# Patient Record
Sex: Male | Born: 1935 | Race: White | Hispanic: No | Smoking: Former smoker
Health system: Southern US, Community
[De-identification: ages and names within clinical notes are randomized; demographics above are authoritative.]

## PROBLEM LIST (undated history)

## (undated) DIAGNOSIS — E079 Disorder of thyroid, unspecified: Secondary | ICD-10-CM

## (undated) DIAGNOSIS — I714 Abdominal aortic aneurysm, without rupture, unspecified: Secondary | ICD-10-CM

## (undated) DIAGNOSIS — I10 Essential (primary) hypertension: Secondary | ICD-10-CM

## (undated) DIAGNOSIS — R0602 Shortness of breath: Secondary | ICD-10-CM

## (undated) DIAGNOSIS — A809 Acute poliomyelitis, unspecified: Secondary | ICD-10-CM

## (undated) DIAGNOSIS — M1990 Unspecified osteoarthritis, unspecified site: Secondary | ICD-10-CM

## (undated) DIAGNOSIS — Z9289 Personal history of other medical treatment: Secondary | ICD-10-CM

## (undated) DIAGNOSIS — J449 Chronic obstructive pulmonary disease, unspecified: Secondary | ICD-10-CM

## (undated) DIAGNOSIS — I2510 Atherosclerotic heart disease of native coronary artery without angina pectoris: Secondary | ICD-10-CM

## (undated) DIAGNOSIS — I499 Cardiac arrhythmia, unspecified: Secondary | ICD-10-CM

## (undated) DIAGNOSIS — E785 Hyperlipidemia, unspecified: Secondary | ICD-10-CM

## (undated) DIAGNOSIS — I4892 Unspecified atrial flutter: Secondary | ICD-10-CM

## (undated) HISTORY — PX: JOINT REPLACEMENT: SHX530

## (undated) HISTORY — DX: Unspecified osteoarthritis, unspecified site: M19.90

## (undated) HISTORY — DX: Essential (primary) hypertension: I10

## (undated) HISTORY — PX: POPLITEAL SYNOVIAL CYST EXCISION: SUR555

## (undated) HISTORY — DX: Chronic obstructive pulmonary disease, unspecified: J44.9

## (undated) HISTORY — DX: Hyperlipidemia, unspecified: E78.5

## (undated) HISTORY — DX: Unspecified atrial flutter: I48.92

## (undated) HISTORY — DX: Personal history of other medical treatment: Z92.89

## (undated) HISTORY — DX: Abdominal aortic aneurysm, without rupture: I71.4

## (undated) HISTORY — DX: Disorder of thyroid, unspecified: E07.9

## (undated) HISTORY — DX: Abdominal aortic aneurysm, without rupture, unspecified: I71.40

## (undated) HISTORY — DX: Atherosclerotic heart disease of native coronary artery without angina pectoris: I25.10

---
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
## 1992-04-19 HISTORY — PX: TOTAL HIP ARTHROPLASTY: SHX124

## 2002-04-19 HISTORY — PX: KNEE ARTHROPLASTY: SHX992

## 2007-04-20 HISTORY — PX: CARDIAC CATHETERIZATION: SHX172

## 2008-04-01 ENCOUNTER — Ambulatory Visit (HOSPITAL_COMMUNITY): Admission: RE | Admit: 2008-04-01 | Discharge: 2008-04-01 | Payer: Self-pay | Admitting: Orthopedic Surgery

## 2008-11-18 ENCOUNTER — Encounter: Admission: RE | Admit: 2008-11-18 | Discharge: 2008-11-18 | Payer: Self-pay | Admitting: Family Medicine

## 2009-07-31 ENCOUNTER — Encounter: Admission: RE | Admit: 2009-07-31 | Discharge: 2009-07-31 | Payer: Self-pay | Admitting: Family Medicine

## 2009-08-11 ENCOUNTER — Ambulatory Visit: Payer: Self-pay | Admitting: Surgery

## 2009-08-18 ENCOUNTER — Encounter: Admission: RE | Admit: 2009-08-18 | Discharge: 2009-08-18 | Payer: Self-pay | Admitting: Surgery

## 2009-08-18 ENCOUNTER — Ambulatory Visit: Payer: Self-pay | Admitting: Surgery

## 2010-03-16 ENCOUNTER — Ambulatory Visit: Payer: Self-pay | Admitting: Surgery

## 2010-03-16 ENCOUNTER — Encounter
Admission: RE | Admit: 2010-03-16 | Discharge: 2010-03-16 | Payer: Self-pay | Source: Home / Self Care | Admitting: Surgery

## 2010-04-01 ENCOUNTER — Ambulatory Visit (HOSPITAL_COMMUNITY)
Admission: RE | Admit: 2010-04-01 | Discharge: 2010-04-01 | Payer: Self-pay | Source: Home / Self Care | Attending: Surgery | Admitting: Surgery

## 2010-04-17 ENCOUNTER — Inpatient Hospital Stay (HOSPITAL_COMMUNITY)
Admission: RE | Admit: 2010-04-17 | Discharge: 2010-04-18 | Payer: Self-pay | Source: Home / Self Care | Attending: Surgery | Admitting: Surgery

## 2010-04-19 DIAGNOSIS — Z9289 Personal history of other medical treatment: Secondary | ICD-10-CM

## 2010-04-19 HISTORY — DX: Personal history of other medical treatment: Z92.89

## 2010-04-19 HISTORY — PX: TRANSTHORACIC ECHOCARDIOGRAM: SHX275

## 2010-05-01 ENCOUNTER — Ambulatory Visit: Admit: 2010-05-01 | Payer: Self-pay | Admitting: Surgery

## 2010-05-01 ENCOUNTER — Ambulatory Visit
Admission: RE | Admit: 2010-05-01 | Discharge: 2010-05-01 | Payer: Self-pay | Source: Home / Self Care | Attending: Surgery | Admitting: Surgery

## 2010-05-04 ENCOUNTER — Ambulatory Visit: Admit: 2010-05-04 | Payer: Self-pay | Admitting: Surgery

## 2010-05-11 ENCOUNTER — Ambulatory Visit
Admission: RE | Admit: 2010-05-11 | Discharge: 2010-05-11 | Payer: Self-pay | Source: Home / Self Care | Attending: Surgery | Admitting: Surgery

## 2010-05-12 NOTE — Procedures (Unsigned)
CAROTID DUPLEX EXAM  INDICATION:  Preoperative AAA repair.  HISTORY: Diabetes:  No. Cardiac:  No. Hypertension:  Yes. Smoking:  Previous. Previous Surgery:  No. CV History:  Asymptomatic. Amaurosis Fugax No, Paresthesias No, Hemiparesis No                                      RIGHT             LEFT Brachial systolic pressure:         148               146 Brachial Doppler waveforms:         Normal            Normal Vertebral direction of flow:        Antegrade         Antegrade DUPLEX VELOCITIES (cm/sec) CCA peak systolic                   75                103 ECA peak systolic                   131               122 ICA peak systolic                   81                70 ICA end diastolic                   16                19 PLAQUE MORPHOLOGY:                  Mixed             Heterogeneous PLAQUE AMOUNT:                      Minimal           Minimal PLAQUE LOCATION:                    Bifurcation       CCA, bifurcation  IMPRESSION: 1. Bilateral internal carotid artery velocities suggest 1% to 39%     stenosis. 2. Antegrade vertebral arteries bilaterally.    ___________________________________________ V. Charlena Cross, MD  EM/MEDQ  D:  05/01/2010  T:  05/01/2010  Job:  [id number]

## 2010-05-12 NOTE — Assessment & Plan Note (Addendum)
OFFICE VISIT  Austin [name], Austin [name] DOB:  1926-11-03                                       05/11/2010 CHART#:[id number]  The patient comes back in today for further discussions of his abdominal aortic aneurysm.  Recently I tried to perform this procedure endovascularly; however, based on the way the endograft was lying I ended up aborting the procedure and just tried to get the graft out.  I had to convert the right groin from a percutaneous to an open repair in order to get the graft out.  Now the patient comes back in today to discuss his options.  We have decided to proceed with aortobifemoral grafting to be performed February 2nd at [facility].  I discussed all the risks and benefits of the procedure including the risk of dying, the risk of cardiopulmonary complications, risk of intestinal complications, blood loss, lower extremity peripheral vascular problems.  He understands all the risks as well as the benefits.  I ultrasounded his carotids today which have minimal stenosis bilaterally.  He is an ABI of 1.0 bilaterally.  He has been cleared by nuclear [facility] at [facility].  I am giving him a bottle of mag citrate for bowel prep prior to his operation which his first case Tuesday, February 2nd.    Jorge Ny, MD Electronically Signed  VWB/MEDQ  D:  05/11/2010  T:  05/12/2010  Job:  [id number]  cc:   Burnell Blanks, MD

## 2010-05-18 LAB — CBC
HCT: 41.4 % (ref 39.0–52.0)
MCV: 95 fL (ref 78.0–100.0)
RBC: 4.36 MIL/uL (ref 4.22–5.81)
RDW: 13.4 % (ref 11.5–15.5)
WBC: 8 10*3/uL (ref 4.0–10.5)

## 2010-05-18 LAB — URINALYSIS, ROUTINE W REFLEX MICROSCOPIC
Bilirubin Urine: NEGATIVE
Specific Gravity, Urine: 1.018 (ref 1.005–1.030)
pH: 5.5 (ref 5.0–8.0)

## 2010-05-18 LAB — URINE MICROSCOPIC-ADD ON

## 2010-05-18 LAB — BLOOD GAS, ARTERIAL
Patient temperature: 98.6
TCO2: 23.3 mmol/L (ref 0–100)
pH, Arterial: 7.475 — ABNORMAL HIGH (ref 7.350–7.450)

## 2010-05-18 LAB — COMPREHENSIVE METABOLIC PANEL
ALT: 18 U/L (ref 0–53)
Albumin: 3.8 g/dL (ref 3.5–5.2)
Alkaline Phosphatase: 74 U/L (ref 39–117)
Potassium: 4.5 mEq/L (ref 3.5–5.1)
Sodium: 136 mEq/L (ref 135–145)
Total Protein: 6.5 g/dL (ref 6.0–8.3)

## 2010-05-18 LAB — SURGICAL PCR SCREEN: MRSA, PCR: NEGATIVE

## 2010-05-21 ENCOUNTER — Inpatient Hospital Stay (HOSPITAL_COMMUNITY): Payer: Medicare Other

## 2010-05-21 ENCOUNTER — Other Ambulatory Visit: Payer: Self-pay | Admitting: Surgery

## 2010-05-21 ENCOUNTER — Inpatient Hospital Stay (HOSPITAL_COMMUNITY)
Admission: RE | Admit: 2010-05-21 | Discharge: 2010-05-31 | DRG: 238 | Disposition: A | Discharge status: Home / Self Care | Payer: Medicare Other | Attending: Surgery | Admitting: Surgery

## 2010-05-21 DIAGNOSIS — Z96659 Presence of unspecified artificial knee joint: Secondary | ICD-10-CM

## 2010-05-21 DIAGNOSIS — I10 Essential (primary) hypertension: Secondary | ICD-10-CM | POA: Diagnosis present

## 2010-05-21 DIAGNOSIS — Z87891 Personal history of nicotine dependence: Secondary | ICD-10-CM

## 2010-05-21 DIAGNOSIS — I714 Abdominal aortic aneurysm, without rupture, unspecified: Principal | ICD-10-CM | POA: Diagnosis present

## 2010-05-21 DIAGNOSIS — D649 Anemia, unspecified: Secondary | ICD-10-CM | POA: Diagnosis not present

## 2010-05-21 DIAGNOSIS — Y9210 Unspecified residential institution as the place of occurrence of the external cause: Secondary | ICD-10-CM | POA: Diagnosis not present

## 2010-05-21 DIAGNOSIS — Y832 Surgical operation with anastomosis, bypass or graft as the cause of abnormal reaction of the patient, or of later complication, without mention of misadventure at the time of the procedure: Secondary | ICD-10-CM | POA: Diagnosis not present

## 2010-05-21 DIAGNOSIS — E039 Hypothyroidism, unspecified: Secondary | ICD-10-CM | POA: Diagnosis present

## 2010-05-21 DIAGNOSIS — K560 Paralytic ileus: Secondary | ICD-10-CM | POA: Diagnosis not present

## 2010-05-21 DIAGNOSIS — J449 Chronic obstructive pulmonary disease, unspecified: Secondary | ICD-10-CM | POA: Diagnosis present

## 2010-05-21 DIAGNOSIS — E876 Hypokalemia: Secondary | ICD-10-CM | POA: Diagnosis not present

## 2010-05-21 DIAGNOSIS — E7800 Pure hypercholesterolemia, unspecified: Secondary | ICD-10-CM | POA: Diagnosis present

## 2010-05-21 DIAGNOSIS — I723 Aneurysm of iliac artery: Secondary | ICD-10-CM | POA: Diagnosis present

## 2010-05-21 DIAGNOSIS — Z7982 Long term (current) use of aspirin: Secondary | ICD-10-CM

## 2010-05-21 DIAGNOSIS — J4489 Other specified chronic obstructive pulmonary disease: Secondary | ICD-10-CM | POA: Diagnosis present

## 2010-05-21 DIAGNOSIS — Z96649 Presence of unspecified artificial hip joint: Secondary | ICD-10-CM

## 2010-05-21 DIAGNOSIS — K929 Disease of digestive system, unspecified: Secondary | ICD-10-CM | POA: Diagnosis not present

## 2010-05-21 HISTORY — PX: ABDOMINAL AORTIC ANEURYSM REPAIR: SUR1152

## 2010-05-21 LAB — BLOOD GAS, ARTERIAL
Drawn by: 249101
O2 Content: 12 L/min
pCO2 arterial: 39.2 mmHg (ref 35.0–45.0)
pH, Arterial: 7.384 (ref 7.350–7.450)
pO2, Arterial: 71.9 mmHg — ABNORMAL LOW (ref 80.0–100.0)

## 2010-05-21 LAB — PROTIME-INR
INR: 1.36 (ref 0.00–1.49)
Prothrombin Time: 17 seconds — ABNORMAL HIGH (ref 11.6–15.2)

## 2010-05-21 LAB — BASIC METABOLIC PANEL
CO2: 23 mEq/L (ref 19–32)
Calcium: 7.3 mg/dL — ABNORMAL LOW (ref 8.4–10.5)
Creatinine, Ser: 1.02 mg/dL (ref 0.4–1.5)
GFR calc Af Amer: >60 mL/min (ref >60)

## 2010-05-21 LAB — PREPARE FRESH FROZEN PLASMA: Unit division: 0

## 2010-05-21 LAB — CBC
MCH: 31.3 pg (ref 26.0–34.0)
Platelets: 146 10*3/uL — ABNORMAL LOW (ref 150–400)
RBC: 3.1 MIL/uL — ABNORMAL LOW (ref 4.22–5.81)
WBC: 14.9 10*3/uL — ABNORMAL HIGH (ref 4.0–10.5)

## 2010-05-21 LAB — GLUCOSE, CAPILLARY: Glucose-Capillary: 141 mg/dL — ABNORMAL HIGH (ref 70–99)

## 2010-05-22 ENCOUNTER — Inpatient Hospital Stay (HOSPITAL_COMMUNITY): Payer: Medicare Other

## 2010-05-22 LAB — GLUCOSE, CAPILLARY

## 2010-05-22 LAB — CBC
Hemoglobin: 10 g/dL — ABNORMAL LOW (ref 13.0–17.0)
MCHC: 32.6 g/dL (ref 30.0–36.0)
RDW: 13.8 % (ref 11.5–15.5)

## 2010-05-22 LAB — COMPREHENSIVE METABOLIC PANEL
AST: 19 U/L (ref 0–37)
Albumin: 2.5 g/dL — ABNORMAL LOW (ref 3.5–5.2)
Calcium: 7.4 mg/dL — ABNORMAL LOW (ref 8.4–10.5)
Creatinine, Ser: 1.05 mg/dL (ref 0.4–1.5)
GFR calc Af Amer: >60 mL/min (ref >60)
Total Protein: 4.1 g/dL — ABNORMAL LOW (ref 6.0–8.3)

## 2010-05-22 LAB — AMYLASE: Amylase: 56 U/L (ref 0–105)

## 2010-05-23 LAB — CBC
MCHC: 33.2 g/dL (ref 30.0–36.0)
MCV: 96.3 fL (ref 78.0–100.0)
Platelets: 103 10*3/uL — ABNORMAL LOW (ref 150–400)
RDW: 13.8 % (ref 11.5–15.5)
WBC: 16.7 10*3/uL — ABNORMAL HIGH (ref 4.0–10.5)

## 2010-05-23 LAB — GLUCOSE, CAPILLARY
Glucose-Capillary: 157 mg/dL — ABNORMAL HIGH (ref 70–99)
Glucose-Capillary: 118 mg/dL — ABNORMAL HIGH (ref 70–99)
Glucose-Capillary: 127 mg/dL — ABNORMAL HIGH (ref 70–99)

## 2010-05-23 LAB — BASIC METABOLIC PANEL
BUN: 19 mg/dL (ref 6–23)
Calcium: 7.7 mg/dL — ABNORMAL LOW (ref 8.4–10.5)
GFR calc non Af Amer: >60 mL/min (ref >60)
GFR calc non Af Amer: >60 mL/min (ref >60)
Potassium: 3.6 mEq/L (ref 3.5–5.1)
Potassium: 3.6 mEq/L (ref 3.5–5.1)
Sodium: 131 mEq/L — ABNORMAL LOW (ref 135–145)

## 2010-05-23 LAB — CARDIAC PANEL(CRET KIN+CKTOT+MB+TROPI)
CK, MB: 3.5 ng/mL (ref 0.3–4.0)
Relative Index: 1.6 (ref 0.0–2.5)
Troponin I: 0.02 ng/mL (ref 0.00–0.06)

## 2010-05-24 LAB — TYPE AND SCREEN: Unit division: 0

## 2010-05-24 LAB — GLUCOSE, CAPILLARY
Glucose-Capillary: 152 mg/dL — ABNORMAL HIGH (ref 70–99)
Glucose-Capillary: 127 mg/dL — ABNORMAL HIGH (ref 70–99)
Glucose-Capillary: 125 mg/dL — ABNORMAL HIGH (ref 70–99)

## 2010-05-24 LAB — CBC
MCH: 31.2 pg (ref 26.0–34.0)
Platelets: 129 10*3/uL — ABNORMAL LOW (ref 150–400)
RBC: 2.92 MIL/uL — ABNORMAL LOW (ref 4.22–5.81)
WBC: 11.6 10*3/uL — ABNORMAL HIGH (ref 4.0–10.5)

## 2010-05-24 LAB — BASIC METABOLIC PANEL
Chloride: 99 mEq/L (ref 96–112)
Creatinine, Ser: 0.86 mg/dL (ref 0.4–1.5)
GFR calc Af Amer: >60 mL/min (ref >60)
Potassium: 3.6 mEq/L (ref 3.5–5.1)

## 2010-05-25 LAB — GLUCOSE, CAPILLARY
Glucose-Capillary: 131 mg/dL — ABNORMAL HIGH (ref 70–99)
Glucose-Capillary: 139 mg/dL — ABNORMAL HIGH (ref 70–99)
Glucose-Capillary: 139 mg/dL — ABNORMAL HIGH (ref 70–99)

## 2010-05-26 ENCOUNTER — Inpatient Hospital Stay (HOSPITAL_COMMUNITY): Payer: Medicare Other

## 2010-05-26 LAB — BASIC METABOLIC PANEL
BUN: 30 mg/dL — ABNORMAL HIGH (ref 6–23)
CO2: 29 mEq/L (ref 19–32)
Calcium: 8.4 mg/dL (ref 8.4–10.5)
Creatinine, Ser: 1.26 mg/dL (ref 0.4–1.5)
GFR calc Af Amer: >60 mL/min (ref >60)
Glucose, Bld: 150 mg/dL — ABNORMAL HIGH (ref 70–99)

## 2010-05-26 LAB — CBC
HCT: 31.3 % — ABNORMAL LOW (ref 39.0–52.0)
HCT: 30.9 % — ABNORMAL LOW (ref 39.0–52.0)
Hemoglobin: 10.6 g/dL — ABNORMAL LOW (ref 13.0–17.0)
Hemoglobin: 10 g/dL — ABNORMAL LOW (ref 13.0–17.0)
MCH: 31.4 pg (ref 26.0–34.0)
MCH: 30.9 pg (ref 26.0–34.0)
MCHC: 33.9 g/dL (ref 30.0–36.0)
MCHC: 32.4 g/dL (ref 30.0–36.0)
MCV: 92.6 fL (ref 78.0–100.0)
MCV: 95.4 fL (ref 78.0–100.0)

## 2010-05-26 LAB — GLUCOSE, CAPILLARY: Glucose-Capillary: 151 mg/dL — ABNORMAL HIGH (ref 70–99)

## 2010-05-27 ENCOUNTER — Inpatient Hospital Stay (HOSPITAL_COMMUNITY): Payer: Medicare Other

## 2010-05-27 LAB — CBC
HCT: 28.2 % — ABNORMAL LOW (ref 39.0–52.0)
MCHC: 33 g/dL (ref 30.0–36.0)
MCV: 95.9 fL (ref 78.0–100.0)
RDW: 13.8 % (ref 11.5–15.5)

## 2010-05-27 LAB — GLUCOSE, CAPILLARY

## 2010-05-27 LAB — BASIC METABOLIC PANEL
BUN: 41 mg/dL — ABNORMAL HIGH (ref 6–23)
Calcium: 8.1 mg/dL — ABNORMAL LOW (ref 8.4–10.5)
GFR calc non Af Amer: >60 mL/min (ref >60)
Glucose, Bld: 126 mg/dL — ABNORMAL HIGH (ref 70–99)
Sodium: 139 mEq/L (ref 135–145)

## 2010-05-29 LAB — CROSSMATCH
ABO/RH(D): A POS
Unit division: 0
Unit division: 0

## 2010-05-29 LAB — BASIC METABOLIC PANEL
GFR calc non Af Amer: >60 mL/min (ref >60)
Potassium: 3.4 mEq/L — ABNORMAL LOW (ref 3.5–5.1)
Sodium: 135 mEq/L (ref 135–145)

## 2010-05-30 LAB — CBC
HCT: 29.4 % — ABNORMAL LOW (ref 39.0–52.0)
Hemoglobin: 9.8 g/dL — ABNORMAL LOW (ref 13.0–17.0)
WBC: 8.3 10*3/uL (ref 4.0–10.5)

## 2010-05-30 LAB — BASIC METABOLIC PANEL
CO2: 24 mEq/L (ref 19–32)
GFR calc non Af Amer: >60 mL/min (ref >60)
Glucose, Bld: 118 mg/dL — ABNORMAL HIGH (ref 70–99)
Potassium: 3.6 mEq/L (ref 3.5–5.1)
Sodium: 134 mEq/L — ABNORMAL LOW (ref 135–145)

## 2010-05-31 NOTE — Op Note (Addendum)
NAME:  Austin [name], Austin [name]           ACCOUNT NO.:  [id number]  MEDICAL RECORD NO.:  [id number]           PATIENT TYPE:  I  LOCATION:  2315                         FACILITY:  [facility]  PHYSICIAN:  Juleen China IV, MDDATE OF BIRTH:  [birth year]  DATE OF PROCEDURE:  05/21/2010 DATE OF DISCHARGE:                              OPERATIVE REPORT   PREOPERATIVE DIAGNOSIS:  Abdominal aortic and iliac aneurysm.  POSTOPERATIVE DIAGNOSIS:  Abdominal aortic and iliac aneurysm.  PROCEDURE PERFORMED:  Aorto-bi-iliac graft with 18 x 9 Dacron graft.  ANESTHESIA:  General.  SURGEON: 1. Charlena Cross, MD  ASSISTANT:  Della Goo, PA-C  BLOOD LOSS:  See anesthesia record.  SPECIMENS:  Aortic mural thrombus.  COMPLICATIONS:  None.  FINDINGS:  Infrarenal aortic clamp was placed, a bifurcated 18 x 9 graft was sewn at the infrarenal aorta and at the bifurcation of the iliac vessels.  INDICATION:  Austin [name] is an 77 year old gentleman with a large infrarenal abdominal aortic aneurysm and bilateral iliac aneurysms.  I had initially attempted percutaneous endovascular repair; however, I was not happy with the way the device was going to lay due to his very tortuous infrarenal neck, and therefore, I aborted this procedure and remove the graft.  After a long discussion with the family, we have decided to proceed with open aneurysm repair to prevent rupture.  We discussed the myriad of potential complications including blood loss, cardiopulmonary complications, intestinal ischemia, lower extremity ischemia, sexual dysfunction.  The patient and family are all aware of these risks but wished to proceed with aneurysm repair to prevent rupture.  PROCEDURE:  The patient was identified in the holding area and taken to room 8, placed supine on the table.  General endotracheal anesthesia was administered, and the patient was prepped and draped in the usual fashion.  A time-out was called.   Antibiotics were given.  A 10 blade was used to make a midline incision from the xiphoid to below the umbilicus.  Cautery was used throughout the subcutaneous tissue.  The fascia was divided with cautery in the midline.  The peritoneal cavity was entered sharply and the incision was opened throughout its length. The abdomen was inspected.  There was no gross pathology.  There were no liver lesions.  The gallbladder was normal.  The colon was normal.  An Omni-Tract retractor was then placed on the table, a Balfour was used to open the abdomen.  The transverse colon was reflected cephalad.  The small bowel was then mobilized to the patient's right and packed off in a moist blue towel.  The ligament of Treitz was taken down with a combination of cautery and sharp dissection.  The inferior mesenteric vein was identified and ligated between 2-0 silk ties.  Cautery dissection was used to get down to the anterior surface of the aorta.  I initially began dissecting out the patient's infrarenal neck.  This was very tortuous taking 90-degree turn towards the patient's right and then another 90-degree cephalad.  I dissected free the aorta at the level of the left renal artery which was the lowest renal artery and umbilical tape was then passed  at this level.  Next, I identified the inferior mesenteric artery which was encircled with a red vessel loop. Dissection was then carried down to the iliac vessels.  I first exposed the right iliac vessels.  There was aneurysmal degeneration of the distal common iliac artery; therefore, I isolated the common and the external and internal iliac artery and these were both encircled with umbilical tapes.  Next, I avoided the nervous tissue crossing the left iliac artery and individually isolated the external iliac and internal iliac artery.  These were both encircled with umbilical tapes.  The ureters were identified bilaterally and protected laterally.  At  this point in time, the patient was given systemic heparinization.  After the heparin had circulated, both bilateral external and internal iliac arteries were occluded with a Henley clamps.  I then occluded the infrarenal abdominal aorta with an aortic DeBakey clamp.  A #11 blade was used to open the aneurysm which was extended caudad and cranial with Metzenbaum scissors.  There were multiple lumbar arteries which were back bleeding.  These were oversewn with 2-0 figure-of-eight sutures. There was minimal amount of the aortic thrombus, some of which was removed and sent to pathology.  I transected the aorta just distal to the clamp.  I selected an 18 x 9 bifurcated Dacron graft.  The proximal anastomosis was performed first after cutting the graft to the appropriate length.  I placed three horizontal mattress back wall sutures incorporating a felt strip.  These sutures were tied down and then the lateral sutures were used to complete the anastomosis incorporating the felt strip.  Once the anastomosis was secured, the Fogarty-Hydragrip clamps were placed on both limbs of the graft and the aortic clamp was removed, the proximal anastomosis was hemostatic. Attention was then turned towards the right iliac artery.  I proceeded to open the iliac artery on its anterior surface until I got down to near the iliac bifurcation.  Due to his body habitus, this was very deep within the pelvis and exposure was extremely difficult.  For this reason, I elected to perform the distal anastomosis at the level of the hypogastric artery.  The artery was somewhat aneurysmal at this area, however, it did appear adequate to sew to.  The right limb of the graft was then cut to the appropriate length and the iliac artery at this level was transected.  I performed an end-to-end anastomosis using a 4-0 Prolene.  Due to the size discrepancy, multiple pledget-supported sutures were required for hemostasis.  I also  placed CoSeal around the anastomosis.  I have performed the appropriate flushing maneuvers and then reestablished blood flow to the right leg.  This area was then packed off and attention was turned towards the left iliac artery.  I tunneled the graft posterior to the nervous tissue crossing iliac artery.  I opened the iliac artery along its anterior surface and transected it at the level of the hypogastric takeoff, again there was some of the size discrepancy.  I proceeded with performing an end-to-end anastomosis with running 4-0 Prolene.  Prior to completion of the anastomosis, appropriate flush maneuvers were performed, and anastomosis was completed.  Blood flow was first established at the pelvis and then to left leg.  The patient at this point had palpable femoral pulses and good Doppler signal in both external iliac arteries and hypogastric arteries.  Next, I evaluated the inferior mesenteric artery, it had pulsatile backbleeding and I elected to ligate this with 2-0 silk ties.  At this point, the patient's heparin was reversed with 75 mg of protamine.  After the wound was irrigated and I was satisfied with hemostasis, the aneurysmal sac was closed over the graft with a running 2-0 2-0 Vicryl.  I then reapproximated the retroperitoneal tissue with a running 2-0 Vicryl.  The patient's retractor and clamps were then removed.  The bowel was placed back into its anatomic position and ran the bowel to make sure that there were no defects within the bowel, it all appeared healthy.  The NG tube was in good position.  Transverse colon and omentum were then placed over the bowel.  The fascia was closed with two running #1 PDS sutures.  The subcutaneous tissue was closed with a running 3-0 Vicryl, and the skin was closed with running 4-0 Vicryl.  Dermabond was placed on the wound. The drapes were removed.  The patient had audible multiphasic Doppler signals in both feet.  He was then  successfully extubated and taken to the recovery room in stable condition.     Jorge Ny, MD     VWB/MEDQ  D:  05/21/2010  T:  05/22/2010  Job:  [id number]  Electronically Signed by Arelia Longest IV MD on 05/31/2010 10:00:57 PM

## 2010-06-04 NOTE — Discharge Summary (Addendum)
NAME:  Austin [name], Austin [name] NO.:  [id number]  MEDICAL RECORD NO.:  [id number]           PATIENT TYPE:  I  LOCATION:  2034                         FACILITY:  [facility]  PHYSICIAN:  Di Kindle. Edilia Bo, M.D.DATE OF BIRTH:  10-May-1926  DATE OF ADMISSION:  05/21/2010 DATE OF DISCHARGE:                              DISCHARGE SUMMARY   DATE OF DISCHARGE:  May 31, 2010.  CHIEF COMPLAINT:  Abdominal aortic aneurysm.  HISTORY OF PRESENT ILLNESS:  [name] is an 77 year old gentleman whom we have been following for an abdominal aortic aneurysm.  He had been diagnosed with the aneurysm approximately 5 years ago in [place]. He has been asymptomatic.  In April 2011, it was 5.2 cm.  It was also noted, he had bilateral common iliac aneurysms.  He was taken to the [facility] lab for an aortogram on April 01, 2010, it showed a 90-degree angulated neck on the aneurysm, confirmed the size of the aortic aneurysm and bilateral common iliac aneurysms.  It was felt that the patient might be a stent candidate.  He was taken back to the operating room on April 17, 2010, for an attempted endovascular repair of abdominal aortic aneurysm.  This was aborted secondary to inability to traverse the neck, and it was not felt that they could get a good seal with the angulation in the neck of the graft.  The patient was brought back on this admission for open repair.  PAST MEDICAL HISTORY:  Significant for hypertension and hypercholesterolemia.  SOCIAL HISTORY:  He was a previous smoker but quit in 1968.  HOSPITAL COURSE:  The patient was taken to the operating room on May 21, 2010, for an open repair of abdominal aortic aneurysm with an aortobi-iliac bypass graft.  Postoperatively, the patient did well. He was extubated without difficulty.  He had somewhat of a prolonged ileus for several days, but this resolved and he was tolerating p.o. diet, passing flatus, and having bowel  movements.  His abdomen was soft and nontender.  His wounds were healing well.  He had bilateral lower extremities which were warm and pink and DP and PT pulses palpable.  LABORATORY DATA:  On February 11, hemoglobin and hematocrit of 9.8 and 29.4.  Electrolytes within normal limits.  This patient is ambulating, taking p.o., and not requiring any pain medication.  He will be discharged on May 31, 2010.  FINAL DIAGNOSES: 1. Abdominal aortic aneurysm status post open repair with an aortobi-     iliac graft. 2. Hypertension and hypercholesterolemia were well controlled with his     present medications.  He was also started on some metoprolol to     assist with the blood pressure issues.  DISPOSITION:  The patient will be discharged to home.  He will follow up with Dr. Myra Gianotti in approximately 4 weeks.  All instructions were given, both written and verbal to the patient prior to discharge.  DISCHARGE MEDICATIONS: 1. Metoprolol 25 mg twice daily. 2. Percocet 1-2 tablets every 4 hours as needed for pain. 3. Aspirin 81 mg daily. 4. Diovan/HCT 160/12.5 daily. 5. Lovastatin 40 mg daily. 6. Multivitamins daily. 7.  Synthroid 75 mcg daily. 8. Vitamin D 1000 units daily.     Della Goo, PA-C   ______________________________ Di Kindle. Edilia Bo, M.D.    RR/MEDQ  D:  05/30/2010  T:  05/30/2010  Job:  [id number]  Electronically Signed by Della Goo PA on 06/12/2010 03:13:40 PM Electronically Signed by Waverly Ferrari M.D. on 06/16/2010 05:49:51 PM

## 2010-06-05 LAB — POCT I-STAT 7, (LYTES, BLD GAS, ICA,H+H)
Calcium, Ion: 1.1 mmol/L — ABNORMAL LOW (ref 1.12–1.32)
O2 Saturation: 100 % (ref 90.0–100.0)
Patient temperature: 36.6
Potassium: 4.8 mEq/L (ref 3.5–5.1)
Sodium: 135 mEq/L (ref 135–145)
pCO2 arterial: 42.5 mmHg (ref 35.0–45.0)
pH, Arterial: 7.372 (ref 7.350–7.400)

## 2010-06-05 LAB — POCT I-STAT 4, (NA,K, GLUC, HGB,HCT)
Glucose, Bld: 147 mg/dL — ABNORMAL HIGH (ref 70–99)
HCT: 32 % — ABNORMAL LOW (ref 39.0–52.0)

## 2010-06-21 NOTE — Discharge Summary (Addendum)
  NAME:  Austin [name], Austin [name] NO.:  [id number]  MEDICAL RECORD NO.:  [id number]          PATIENT TYPE:  INP  LOCATION:  2011                         FACILITY:  [facility]  PHYSICIAN:  Juleen China IV, MDDATE OF BIRTH:  01-Feb-1927  DATE OF ADMISSION:  04/17/2010 DATE OF DISCHARGE:  04/18/2010                              DISCHARGE SUMMARY   CHIEF COMPLAINT:  Abdominal aortic aneurysm.  HISTORY OF PRESENT ILLNESS:  Austin [name] is an 77 year old gentleman who is being followed for an abdominal aortic aneurysm.  CT scan showed that it is now 5.1 cm with a very tortuous neck.  He had a diagnostic arteriogram which showed he may be a candidate for endovascular repair and so he was brought to the hospital for that purpose on this admission.  PAST MEDICAL HISTORY:  Significant for hypothyroidism and high blood pressure.  He has no diabetes or coronary artery disease.  HOSPITAL COURSE:  The patient was taken to the operating room on April 17, 2010, for ultrasound-guided percutaneous exposure, converted to open exposure of the right common femoral artery with an attempted endovascular repair of abdominal aortic aneurysm.  This was aborted secondary to the tortuosity of the patient's angled neck.  He will be brought back for full open abdominal aortic aneurysm open repair.  FINAL DIAGNOSES: 1. Abdominal aortic aneurysm greater than 5.1 cm, status post     attempted endovascular repair which was aborted secondary to the     tortuosity of the neck of the aneurysm. 2. Hypothyroidism and hypertension were well controlled with his     preoperative medications.  DISPOSITION:  He was discharged to home on April 18, 2010 and will follow up with Dr. Myra Gianotti in several weeks and return for an open repair of abdominal aortic aneurysm.  DISCHARGE MEDICATIONS: 1. Synthroid 75 mcg daily. 2. Vitamin D daily. 3. Vitamin C daily. 4. Aspirin 81 mg daily. 5. Lovastatin 40 mg  daily. 6. Diovan HCT 160/12.5 mg daily. 7. He is also given some Percocet 1 tablet every 4 hours as needed for     pain.     Austin Goo, PA-C   ______________________________ Seth Bake. Charlena Cross, MD    RR/MEDQ  D:  06/17/2010  T:  06/18/2010  Job:  [id number]  Electronically Signed by Arelia Longest IV MD on 06/21/2010 07:38:28 PM

## 2010-06-22 ENCOUNTER — Ambulatory Visit (INDEPENDENT_AMBULATORY_CARE_PROVIDER_SITE_OTHER): Payer: Medicare Other | Admitting: Surgery

## 2010-06-22 DIAGNOSIS — I714 Abdominal aortic aneurysm, without rupture, unspecified: Secondary | ICD-10-CM

## 2010-06-23 NOTE — Assessment & Plan Note (Signed)
OFFICE VISIT  GALE, HULSE DOB:  June 03, 1926                                       06/22/2010 CHART#:[id number]  This is an 77 year old gentleman who underwent open repair of abdominal aortic aneurysm with an aortobi-iliac graft.  He has done remarkably well since his operation which was on February 2.  He has lost about 15 pounds primarily due to his decreased intake.  His appetite is returning.  His bowel function is back to normal.  On examination his extremities are warm and well-perfused.  His midline incision is well-healed.  There is no hernia.  I am very pleased with the progress the patient has made in his recovery.  He is scheduled to go down to [place] for the next couple of months.  I will see him back in 3 months just to make sure he is doing okay.    Jorge Ny, MD Electronically Signed  VWB/MEDQ  D:  06/22/2010  T:  06/23/2010  Job:  [id number]  cc:   Burnell Blanks, MD

## 2010-06-29 LAB — COMPREHENSIVE METABOLIC PANEL
AST: 26 U/L (ref 0–37)
Albumin: 4.1 g/dL (ref 3.5–5.2)
Alkaline Phosphatase: 69 U/L (ref 39–117)
Chloride: 101 mEq/L (ref 96–112)
GFR calc Af Amer: >60 mL/min (ref >60)
Potassium: 4.4 mEq/L (ref 3.5–5.1)
Sodium: 136 mEq/L (ref 135–145)
Total Bilirubin: 0.9 mg/dL (ref 0.3–1.2)
Total Protein: 7.1 g/dL (ref 6.0–8.3)

## 2010-06-29 LAB — URINALYSIS, ROUTINE W REFLEX MICROSCOPIC
Ketones, ur: NEGATIVE mg/dL
Leukocytes, UA: NEGATIVE
Nitrite: NEGATIVE
Protein, ur: NEGATIVE mg/dL
pH: 5 (ref 5.0–8.0)

## 2010-06-29 LAB — TYPE AND SCREEN: Unit division: 0

## 2010-06-29 LAB — BLOOD GAS, ARTERIAL
Acid-Base Excess: 0.1 mmol/L (ref 0.0–2.0)
O2 Saturation: 93.4 %
TCO2: 24.7 mmol/L (ref 0–100)
pCO2 arterial: 34.8 mmHg — ABNORMAL LOW (ref 35.0–45.0)
pO2, Arterial: 69.2 mmHg — ABNORMAL LOW (ref 80.0–100.0)

## 2010-06-29 LAB — APTT: aPTT: 42 seconds — ABNORMAL HIGH (ref 24–37)

## 2010-06-29 LAB — BASIC METABOLIC PANEL
BUN: 21 mg/dL (ref 6–23)
Chloride: 101 mEq/L (ref 96–112)
GFR calc Af Amer: >60 mL/min (ref >60)
GFR calc non Af Amer: >60 mL/min (ref >60)
GFR calc non Af Amer: >60 mL/min (ref >60)
Glucose, Bld: 154 mg/dL — ABNORMAL HIGH (ref 70–99)
Potassium: 4.2 mEq/L (ref 3.5–5.1)
Potassium: 4.3 mEq/L (ref 3.5–5.1)
Sodium: 135 mEq/L (ref 135–145)
Sodium: 136 mEq/L (ref 135–145)

## 2010-06-29 LAB — POCT I-STAT 7, (LYTES, BLD GAS, ICA,H+H)
Bicarbonate: 26.3 mEq/L — ABNORMAL HIGH (ref 20.0–24.0)
Patient temperature: 35.8
TCO2: 28 mmol/L (ref 0–100)
pCO2 arterial: 49.1 mmHg — ABNORMAL HIGH (ref 35.0–45.0)
pH, Arterial: 7.332 — ABNORMAL LOW (ref 7.350–7.450)

## 2010-06-29 LAB — CBC
HCT: 34.4 % — ABNORMAL LOW (ref 39.0–52.0)
HCT: 35.6 % — ABNORMAL LOW (ref 39.0–52.0)
Hemoglobin: 11.8 g/dL — ABNORMAL LOW (ref 13.0–17.0)
Hemoglobin: 12 g/dL — ABNORMAL LOW (ref 13.0–17.0)
MCHC: 33.7 g/dL (ref 30.0–36.0)
Platelets: 149 10*3/uL — ABNORMAL LOW (ref 150–400)
RBC: 3.59 MIL/uL — ABNORMAL LOW (ref 4.22–5.81)
RBC: 4.65 MIL/uL (ref 4.22–5.81)
RDW: 13.3 % (ref 11.5–15.5)
RDW: 13.3 % (ref 11.5–15.5)
WBC: 7.5 10*3/uL (ref 4.0–10.5)
WBC: 7.7 10*3/uL (ref 4.0–10.5)

## 2010-06-29 LAB — ABO/RH: ABO/RH(D): A POS

## 2010-06-29 LAB — URINE MICROSCOPIC-ADD ON

## 2010-06-29 LAB — SURGICAL PCR SCREEN: Staphylococcus aureus: NEGATIVE

## 2010-06-30 LAB — POCT I-STAT, CHEM 8
Chloride: 104 mEq/L (ref 96–112)
Creatinine, Ser: 1.1 mg/dL (ref 0.4–1.5)
Glucose, Bld: 120 mg/dL — ABNORMAL HIGH (ref 70–99)
Hemoglobin: 16 g/dL (ref 13.0–17.0)
Potassium: 4.1 mEq/L (ref 3.5–5.1)

## 2010-09-01 NOTE — Assessment & Plan Note (Signed)
OFFICE VISIT   DOM, HAVERLAND  DOB:  [birth year]                                       03/16/2010  CHART#:[id number]   REASON FOR VISIT:  Follow up aneurysm.   The patient is an 77 year old gentleman that I have been following for  abdominal aortic aneurysm.  He comes back today for a CT scan.  He has  not had any symptoms.   PHYSICAL EXAMINATION:  Heart rate 76, blood pressure 133/78.  General:  Well-appearing, no distress.  Respirations are nonlabored.  Cardiovascular:  Palpable pedal pulses.  Neurologic:  He is intact.   DIAGNOSTICS:  I have reviewed his CT scan that shows progressive  increase in size of his aneurysm.  It is now 5.1 cm.   ASSESSMENT:  Abdominal aortic aneurysm.   PLAN:  The patient's aneurysm has increased in size to the point where I  would consider endovascular repair.  Due to the tortuosity of his  infrarenal neck, I think that we need to first get a diagnostic  arteriogram to see if he is indeed a candidate for endovascular repair.  I plan on doing this on Wednesday, December 14th.  If he is a candidate  for repair, we will plan on fixing this the week between Christmas and  New Year's.  I will need to further evaluate his iliac system to see  which device he is going to be a candidate for.  In addition, while he  is recovering from his catheterization he will need a carotid duplex.     Jorge Ny, MD  Electronically Signed   VWB/MEDQ  D:  03/16/2010  T:  03/16/2010  Job:  [id number]   cc:   Burnell Blanks, MD

## 2010-09-01 NOTE — Assessment & Plan Note (Signed)
OFFICE VISIT   KINGDOM, VANZANTEN  DOB:  20-May-1926                                       08/18/2009  CHART#:[id number]   REASON FOR VISIT:  Follow up aneurysm.   The patient comes back in today for discussion of a CT scan to evaluate  his abdominal aortic aneurysm.  I have independently reviewed his  aneurysm.  By my measurements, the largest diameter of his aneurysm is  4.85 cm.  The patient does have marked tortuosity of his aorta.   I discussed that this may preclude him from being an endovascular stent  graft candidate.  However, I am unwilling to make that conclusion at  this time.  Regardless of the size of his aneurysm, I think the risk of  rupture is low.  Due to his tortuosity, I also think it is going to be  difficult to follow him with an ultrasound and get an accurate  measurement.  I am going to have him see me in 6 months with a repeat CT  scan.  We spent approximately 20 minutes today discussing these  findings.     Jorge Ny, MD  Electronically Signed   VWB/MEDQ  D:  08/18/2009  T:  08/19/2009  Job:  [id number]

## 2010-09-01 NOTE — Assessment & Plan Note (Signed)
OFFICE VISIT   Austin [name], Austin [name]  DOB:  [birth year]                                       08/11/2009  CHART#:[id number]   REASON FOR VISIT:  Abdominal aortic aneurysm.   REFERRING PHYSICIAN:  Dr. Nathanial Rancher.   FAMILY HISTORY:  This is an 77 year old gentleman that I am seeing at  the request of Dr. Nathanial Rancher for evaluation of abdominal aortic aneurysm.  The patient recently moved down from [place].  He had been diagnosed  with the aneurysm approximately 5 years ago.  He is here to have further  followup.  A recent ultrasound had revealed that it had in size to 5.2  cm.  He does not have any symptoms, specifically no abdominal pain or  back pain.   The patient has a history of hypertension and hypercholesterolemia, both  of which are medically managed.  He has a history smoking but quit in  1968.   REVIEW OF SYSTEMS:  GENERAL:  Negative for fevers, chills, weight gain  weight loss.  CARDIAC:  Positive for a murmur during his youth.  PULMONARY:  Positive for bronchitis  GI:  Negative.  GU:  Negative.  VASCULAR:  Positive for pain in his feet when lying flat.  NEURO:  Negative.  MUSCULOSKELETAL:  Positive for joint pain and muscle pain.  PSYCH:  Negative.  ENT:  Negative.  HEMATOLOGIC:  Negative.  SKIN:  NEGATIVE.   PAST MEDICAL HISTORY:  Hypertension, hypercholesterolemia, questionable  COPD.   PAST SURGICAL HISTORY:  Knee and hip replacement, appendectomy and a  Baker's cyst removal on the left.   FAMILY HISTORY:  Negative for cardiovascular disease at an early age.   SOCIAL HISTORY:  He is widowed with 3 children.  He is a former [employer].  Does not smoke.  Has a history smoking,  quit in 1968.  Drinks 1-2 ounces of scotch per day.   PHYSICAL EXAMINATION:  Heart rate is 59, blood pressure 134/74,  temperature is 97.8  general:  He is well-appearing, in no distress.  HEENT:  Within normal limits.  Lungs are clear  bilaterally.  Cardiovascular:  Regular rate and rhythm.  No cardiac murmur.  No  carotid bruit.  Abdomen:  Soft, nontender.  No hepatosplenomegaly.  Musculoskeletal:  Without major deformity.  Neurologic:  There is no  focal weakness.  Skin:  Without rash.   ASSESSMENT/PLAN:  Abdominal aortic aneurysm with a discrepancy in  ultrasound measurements, most recent of which was 5.2.  Based on the  patient's size at this time, I think it is prudent to proceed with a CT  angiogram to define his anatomy to see if he is a candidate for  endovascular versus open repair and to get the true size of his  aneurysm.  He will get the CT scan next week and then follow up with me  afterwards.     Jorge Ny, MD  Electronically Signed   VWB/MEDQ  D:  08/18/2009  T:  08/19/2009  Job:  [id number]   cc:   Dr. Nathanial Rancher

## 2010-09-01 NOTE — Procedures (Signed)
OPERATIVE REPORT   [name], Austin  DOB:  [birth year]                                       04/01/2010  CHART#:[id number]   PREOPERATIVE DIAGNOSIS:  Infrarenal abdominal aortic aneurysm.   PROCEDURE:  1. Biplane abdominal aortogram  2. Bilateral iliac angiography with oblique views.   SURGEON:  Quita Skye. Hart Rochester, M.D.   ANESTHESIA:  Local with Xylocaine.  Versed 1 mg,  fentanyl 25 mcg.   CONTRAST:  180 mL.   COMPLICATIONS:  None.   DESCRIPTION OF PROCEDURE:  The patient was taken to the [facility]  peripheral endovascular lab and placed in supine position at which time  both groins were prepped with Betadine solution and draped in routine  sterile manner.  After infiltration of 1% Xylocaine, the right common  femoral artery was entered percutaneously.  A guidewire was passed into  the suprarenal aorta under fluoroscopic guidance.  A 5-French sheath and  dilator were passed over the guidewire.  Dilator removed and a standard  marker pigtail catheter positioned in the suprarenal aorta.  AP  abdominal aortogram was performed injecting 20 mL of contrast at 20 mL  per second followed by a second injection of 30 mL at 25 mL per second.  This revealed the suprarenal aorta to be widely patent.  There were  large single renal arteries bilaterally.  There was a sharp  angulation  of the neck to the patient's left side at almost 90 degrees.  A lateral  view of the abdominal aorta was performed injecting 30 mL of contrast.  RAO and LAO magnified projection views of the perirenal aorta was then  obtained.  The catheter was withdrawn into the terminal aorta and AP RAO  and LAO views of the iliacs were obtained injecting 20 mL of contrast on  each occasion.  Having tolerated the procedure well, the catheter was  removed over the guidewire.  Sheath remove.  Adequate compression  applied.  No complications ensued.   FINDINGS:  1. Infrarenal abdominal aortic aneurysm  with 90 degree angulation of      neck to the patient's left side was single widely patent renal      arteries.  2. 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  3. Widely patent common internal and external iliac arteries      bilaterally.   Quita Skye Hart Rochester, M.D.  Electronically Signed   JDL/MEDQ  D:  04/01/2010  T:  04/01/2010  Job:  [id number]

## 2010-09-28 ENCOUNTER — Ambulatory Visit (INDEPENDENT_AMBULATORY_CARE_PROVIDER_SITE_OTHER): Payer: Medicare Other | Admitting: Surgery

## 2010-09-28 DIAGNOSIS — I714 Abdominal aortic aneurysm, without rupture: Secondary | ICD-10-CM

## 2010-09-29 NOTE — Assessment & Plan Note (Signed)
OFFICE VISIT  Austin [name], Austin [name] DOB:  11/08/1926                                       09/28/2010 CHART#:[id number]  The patient comes back in today for followup.  He is status post open aneurysm repair on 05/21/2010.  He comes back in today for office visit. He states he is doing very well.  He has no complaints at this time.  He is starting to gain some weight.  He is becoming active.  He has recently returned from a trip to [place] where he was there for 6 weeks and is doing very well.  PHYSICAL EXAMINATION:  Vital signs:  Heart rate 59, blood pressure 128/77, O2 sat 99%.  General:  He is well-appearing, in no distress. Respirations:  Nonlabored.  Cardiovascular:  He has palpable pedal pulses.  No pulsatile abdominal mass.  Abdomen:  Midline incision is well-healed.  There are no hernias.  ASSESSMENT:  Status post open aneurysm repair.  PLAN:  I will see the patient back in 2 years with an abdominal ultrasound to evaluate his aneurysm.  His initial carotid duplex was 1%- 39% bilaterally.  I have recommended that he continue taking a beta- blocker which we started in the hospital as well as his aspirin.  He will contact me if he has any further questions.    Jorge Ny, MD Electronically Signed  VWB/MEDQ  D:  09/28/2010  T:  09/29/2010  Job:  [id number]  cc:   Burnell Blanks, MD

## 2012-10-18 ENCOUNTER — Other Ambulatory Visit: Payer: Self-pay | Admitting: *Deleted

## 2012-10-18 DIAGNOSIS — I714 Abdominal aortic aneurysm, without rupture: Secondary | ICD-10-CM

## 2012-10-19 ENCOUNTER — Encounter: Payer: Self-pay | Admitting: Surgery

## 2012-10-23 ENCOUNTER — Encounter: Payer: Self-pay | Admitting: Surgery

## 2012-10-23 ENCOUNTER — Ambulatory Visit (INDEPENDENT_AMBULATORY_CARE_PROVIDER_SITE_OTHER): Payer: Medicare Other | Admitting: Surgery

## 2012-10-23 ENCOUNTER — Encounter (INDEPENDENT_AMBULATORY_CARE_PROVIDER_SITE_OTHER): Payer: Medicare Other | Admitting: *Deleted

## 2012-10-23 ENCOUNTER — Other Ambulatory Visit: Payer: Self-pay | Admitting: *Deleted

## 2012-10-23 VITALS — BP 123/51 | HR 56 | Ht 77.0 in | Wt 289.0 lb

## 2012-10-23 DIAGNOSIS — I714 Abdominal aortic aneurysm, without rupture, unspecified: Secondary | ICD-10-CM | POA: Insufficient documentation

## 2012-10-23 DIAGNOSIS — Z48812 Encounter for surgical aftercare following surgery on the circulatory system: Secondary | ICD-10-CM

## 2012-10-23 NOTE — Progress Notes (Signed)
[facility] [place]   Patient name: Austin [name] MRN: [id number] DOB: 13-Aug-1926 Sex: male     Chief Complaint  Patient presents with  . Re-evaluation    last ov 09/28/2010 - aorto-bi-liac graft repair of AAA 05/21/2010    HISTORY OF PRESENT ILLNESS: The patient is back today for followup. He is status post open aneurysm repair with an aortobiiliac graft on 05/21/2010. He has no complaints today. He is on anticoagulation for chronic a flutter. He takes statins for hyperlipidemia. His blood pressure is well controlled with medications. He has a history of smoking but none currently.  Past Medical History  Diagnosis Date  . Hypertension   . Hyperlipidemia   . AAA (abdominal aortic aneurysm)   . Arthritis   . Atrial flutter, chronic   . CAD (coronary artery disease)   . Thyroid disease     hypothyroidism    Past Surgical History  Procedure Laterality Date  . Abdominal aortic aneurysm repair  05/21/2010    aorto bi-iliac BPG  . Appendectomy    . Popliteal synovial cyst excision Left   . Joint replacement      Knee and Hip replacements  . Total hip arthroplasty Left 1999  . Knee arthroplasty Left     History   Social History  . Marital Status: Widowed    Spouse Name: N/A    Number of Children: N/A  . Years of Education: N/A   Occupational History  . Not on file.   Social History Main Topics  . Smoking status: Former Smoker    Quit date: 04/19/1966  . Smokeless tobacco: Not on file  . Alcohol Use: 3.0 oz/week    5 Shots of liquor per week  . Drug Use: No  . Sexually Active: Not on file   Other Topics Concern  . Not on file   Social History Narrative  . No narrative on file    Family History  Problem Relation Age of Onset  . Other Father     varicose veins  . Hyperlipidemia Son   . Hypertension Son   . AAA (abdominal aortic aneurysm) Son     Allergies as of 10/23/2012  . (No Known Allergies)    No current outpatient  prescriptions on file prior to visit.   No current facility-administered medications on file prior to visit.     REVIEW OF SYSTEMS: Cardiovascular: Positive for shortness of breath with exertion and pain in his legs with walking. Positive for swelling in his legs and varicose veins. Pulmonary: Positive for productive cough and. Neurologic: No weakness, paresthesias, aphasia, or amaurosis. Positive for dizziness. Hematologic: No bleeding problems or clotting disorders. Musculoskeletal: No joint pain or joint swelling. Gastrointestinal: No blood in stool or hematemesis Genitourinary: No dysuria or hematuria. Psychiatric:: No history of major depression. Integumentary: No rashes or ulcers. Constitutional: No fever or chills.  PHYSICAL EXAMINATION:   Vital signs are BP 123/51  Pulse 56  Ht 6\' 5"  (1.956 m)  Wt 289 lb (131.09 kg)  BMI 34.26 kg/m2  SpO2 94% General: The patient appears their stated age. HEENT:  No gross abnormalities Pulmonary:  Non labored breathing Abdomen: Soft and non-tender. No incisional hernia Musculoskeletal: There are no major deformities. Neurologic: No focal weakness or paresthesias are detected, Skin: There are no ulcer or rashes noted. Psychiatric: The patient has normal affect. Cardiovascular: There is a regular rate and rhythm without significant murmur appreciated. No carotid bruits   Diagnostic Studies Ultrasound  was ordered and reviewed today. This shows the proximal anastomosis to measure 3.1 cm in maximum diameter. The iliac anastomoses were not visualized.  Assessment: Status post open aneurysm repair Plan: The patient is doing very well. He has no abdominal pain. He has no evidence of an incisional hernia. Ultrasound today shows no significant degeneration of the proximal anastomosis. The iliac anastomoses were not well visualized. The patient will followup again in 2 years with a repeat ultrasound.  Jorge Ny, M.D. [facility] [place] Office: [phone] Pager:  [phone]

## 2012-11-08 ENCOUNTER — Telehealth: Payer: Self-pay | Admitting: *Deleted

## 2012-11-08 DIAGNOSIS — Z01818 Encounter for other preprocedural examination: Secondary | ICD-10-CM

## 2012-11-08 NOTE — Telephone Encounter (Signed)
Dr Allyson Sabal reviewed chart for cardiac clearance.  Dr Allyson Sabal wants Mr Austin [name] to have a lexiscan myoview before we can give him cardiac clearance.  I called patient and made him aware.  He is agreeable to proceed with myoview.

## 2012-11-09 ENCOUNTER — Encounter: Payer: Self-pay | Admitting: Cardiovascular Disease

## 2012-11-15 ENCOUNTER — Ambulatory Visit (HOSPITAL_COMMUNITY)
Admission: RE | Admit: 2012-11-15 | Discharge: 2012-11-15 | Disposition: A | Discharge status: Home / Self Care | Payer: Medicare Other | Source: Ambulatory Visit | Attending: Cardiovascular Disease | Admitting: Cardiovascular Disease

## 2012-11-15 DIAGNOSIS — I4892 Unspecified atrial flutter: Secondary | ICD-10-CM | POA: Insufficient documentation

## 2012-11-15 DIAGNOSIS — I714 Abdominal aortic aneurysm, without rupture, unspecified: Secondary | ICD-10-CM | POA: Insufficient documentation

## 2012-11-15 DIAGNOSIS — I10 Essential (primary) hypertension: Secondary | ICD-10-CM | POA: Insufficient documentation

## 2012-11-15 DIAGNOSIS — I2510 Atherosclerotic heart disease of native coronary artery without angina pectoris: Secondary | ICD-10-CM

## 2012-11-15 DIAGNOSIS — Z01810 Encounter for preprocedural cardiovascular examination: Secondary | ICD-10-CM

## 2012-11-15 DIAGNOSIS — Z87891 Personal history of nicotine dependence: Secondary | ICD-10-CM | POA: Insufficient documentation

## 2012-11-15 DIAGNOSIS — Z01818 Encounter for other preprocedural examination: Secondary | ICD-10-CM

## 2012-11-15 DIAGNOSIS — E663 Overweight: Secondary | ICD-10-CM | POA: Insufficient documentation

## 2012-11-15 MED ORDER — REGADENOSON 0.4 MG/5ML IV SOLN
0.4000 mg | Freq: Once | INTRAVENOUS | Status: AC
  Administered 2012-11-15: 0.4 mg via INTRAVENOUS

## 2012-11-15 MED ORDER — TECHNETIUM TC 99M SESTAMIBI GENERIC - CARDIOLITE
30.0000 | Freq: Once | INTRAVENOUS | Status: AC | PRN
  Administered 2012-11-15: 30 via INTRAVENOUS

## 2012-11-15 MED ORDER — TECHNETIUM TC 99M SESTAMIBI GENERIC - CARDIOLITE
10.0000 | Freq: Once | INTRAVENOUS | Status: AC | PRN
  Administered 2012-11-15: 10 via INTRAVENOUS

## 2012-11-15 NOTE — Procedures (Addendum)
Austin [name]  Cardiology Nuclear Med Study  Austin [name] is a 79 y.o. male     MRN : [id number]     DOB: [birth year]  Procedure Date: 11/15/2012  Nuclear Med Background Indication for Stress Test:  Surgical Clearance History:  CAD Cardiac Risk Factors: History of Smoking, Hypertension, Lipids, Overweight and A-FLUTTER;AAA  Symptoms:  Dizziness, DOE, Fatigue and Light-Headedness   Nuclear Pre-Procedure Caffeine/Decaff Intake:  7:00pm NPO After: 5:00am   IV Site: R Hand  IV 0.9% NS with Angio Cath:  22g  Chest Size (in):  46"  IV Started by: Emmit Pomfret, RN  Height: 6\' 5"  (1.956 m)  Cup Size: n/a  BMI:  Body mass index is 34.26 kg/(m^2). Weight:  289 lb (131.09 kg)   Tech Comments:  N/A    Nuclear Med Study 1 or 2 day study: 1 day  Stress Test Type:  Lexiscan  Order Authorizing Provider:  Nanetta Batty, MD   Resting Radionuclide: Technetium 6m Sestamibi  Resting Radionuclide Dose: 10.5 mCi   Stress Radionuclide:  Technetium 66m Sestamibi  Stress Radionuclide Dose: 31.2 mCi           Stress Protocol Rest HR: 60 Stress HR: 68  Rest BP: 141/87 Stress BP: 146/87  Exercise Time (min): n/a METS: n/a   Predicted Max HR: 136 bpm % Max HR: 50 bpm Rate Pressure Product: 21308  Dose of Adenosine (mg):  n/a Dose of Lexiscan: 0.4 mg  Dose of Atropine (mg): n/a Dose of Dobutamine: n/a mcg/kg/min (at max HR)  Stress Test Technologist: Esperanza Sheets, CCT Nuclear Technologist: Koren Shiver, CNMT   Rest Procedure:  Myocardial perfusion imaging was performed at rest 45 minutes following the intravenous administration of Technetium 58m Sestamibi. Stress Procedure:  The patient received IV Lexiscan 0.4 mg over 15-seconds.  Technetium 23m Sestamibi injected at 30-seconds.  There were no significant changes with Lexiscan.  Quantitative spect images were obtained after a 45  minute delay.  Transient Ischemic Dilatation (Normal <1.22):  1.60 Lung/Heart Ratio (Normal <0.45):  0.46 QGS EDV:  119 ml QGS ESV:  57 ml LV Ejection Fraction: 52%     Rest ECG: NSR - Normal EKG  Stress ECG: No significant change from baseline ECG  QPS Raw Data Images:  Normal; no motion artifact; normal heart/lung ratio. Stress Images:  Normal homogeneous uptake in all areas of the myocardium. Rest Images:  Normal homogeneous uptake in all areas of the myocardium. Subtraction (SDS):  No evidence of ischemia.  Impression Exercise Capacity:  Lexiscan with no exercise. BP Response:  Hypertensive blood pressure response. Clinical Symptoms:  No significant symptoms noted. ECG Impression:  No significant ST segment change suggestive of ischemia. Comparison with Prior Nuclear Study: No significant change from previous study  Overall Impression:  Normal stress nuclear study.  LV Wall Motion:  NL LV Function; NL Wall Motion   Jaianna Nicoll, MD  11/15/2012 1:36 PM

## 2012-11-20 ENCOUNTER — Encounter (HOSPITAL_COMMUNITY): Payer: Self-pay | Admitting: Pharmacy Technician

## 2012-11-23 ENCOUNTER — Other Ambulatory Visit (HOSPITAL_COMMUNITY): Payer: Self-pay | Admitting: Orthopedic Surgery

## 2012-11-23 NOTE — Progress Notes (Addendum)
Clearance Dr Allyson Sabal chart, aortic duplex 7/14, stress test 7/14 EPIC, EKG 11/15/12 on chart

## 2012-11-23 NOTE — Progress Notes (Signed)
NEED PRE OP ORDERS PLEASE-  HAS PST APPT 11/24/12 800AM

## 2012-11-24 ENCOUNTER — Ambulatory Visit (HOSPITAL_COMMUNITY)
Admission: RE | Admit: 2012-11-24 | Discharge: 2012-11-24 | Disposition: A | Discharge status: Home / Self Care | Payer: Medicare Other | Source: Ambulatory Visit | Attending: Orthopedic Surgery | Admitting: Orthopedic Surgery

## 2012-11-24 ENCOUNTER — Encounter (HOSPITAL_COMMUNITY): Payer: Self-pay

## 2012-11-24 ENCOUNTER — Encounter (HOSPITAL_COMMUNITY)
Admission: RE | Admit: 2012-11-24 | Discharge: 2012-11-24 | Disposition: A | Discharge status: Home / Self Care | Payer: Medicare Other | Source: Ambulatory Visit | Attending: Orthopedic Surgery | Admitting: Orthopedic Surgery

## 2012-11-24 DIAGNOSIS — Z01812 Encounter for preprocedural laboratory examination: Secondary | ICD-10-CM | POA: Insufficient documentation

## 2012-11-24 DIAGNOSIS — Z01818 Encounter for other preprocedural examination: Secondary | ICD-10-CM | POA: Insufficient documentation

## 2012-11-24 HISTORY — DX: Shortness of breath: R06.02

## 2012-11-24 HISTORY — DX: Acute poliomyelitis, unspecified: A80.9

## 2012-11-24 HISTORY — DX: Cardiac arrhythmia, unspecified: I49.9

## 2012-11-24 LAB — URINALYSIS, ROUTINE W REFLEX MICROSCOPIC
Bilirubin Urine: NEGATIVE
Glucose, UA: NEGATIVE mg/dL
Hgb urine dipstick: NEGATIVE
Specific Gravity, Urine: 1.024 (ref 1.005–1.030)
pH: 6 (ref 5.0–8.0)

## 2012-11-24 LAB — BASIC METABOLIC PANEL
BUN: 23 mg/dL (ref 6–23)
CO2: 25 mEq/L (ref 19–32)
GFR calc non Af Amer: 75 mL/min — ABNORMAL LOW (ref >90)
Glucose, Bld: 103 mg/dL — ABNORMAL HIGH (ref 70–99)
Potassium: 4.5 mEq/L (ref 3.5–5.1)
Sodium: 135 mEq/L (ref 135–145)

## 2012-11-24 LAB — SURGICAL PCR SCREEN
MRSA, PCR: NEGATIVE
Staphylococcus aureus: NEGATIVE

## 2012-11-24 LAB — CBC
Hemoglobin: 14.6 g/dL (ref 13.0–17.0)
MCH: 31.9 pg (ref 26.0–34.0)
MCHC: 33.6 g/dL (ref 30.0–36.0)
MCV: 95 fL (ref 78.0–100.0)
RBC: 4.58 MIL/uL (ref 4.22–5.81)

## 2012-11-24 NOTE — Patient Instructions (Addendum)
20 EULON ALLNUTT  11/24/2012   Your procedure is scheduled on: 12/04/12  Report to [facility] at 9:40 AM.  Call this number if you have problems the morning of surgery [phone]: [phone]   Remember:   Do not eat food or drink liquids After Midnight.     Take these medicines the morning of surgery with A SIP OF WATER: synthroid, lovastatin, metoprolol   Do not wear jewelry, make-up or nail polish.  Do not wear lotions, powders, or perfumes. You may wear deodorant.  Do not shave 48 hours prior to surgery. Men may shave face and neck.  Do not bring valuables to the hospital.  Contacts, dentures or bridgework may not be worn into surgery.  Leave suitcase in the car. After surgery it may be brought to your room.  For patients admitted to the hospital, checkout time is 11:00 AM the day of discharge.    Please read over the following fact sheets that you were given: MRSA Information, incentive spirometry fact sheet, blood fact sheet  Birdie Sons, RN  pre op nurse call if needed [phone]    FAILURE TO FOLLOW THESE INSTRUCTIONS MAY RESULT IN CANCELLATION OF YOUR SURGERY   Patient Signature: ___________________________________________

## 2012-11-29 NOTE — H&P (Signed)
TOTAL HIP REVISION ADMISSION H&P  Patient is admitted for left revision total hip arthroplasty.  Subjective:  Chief Complaint: Left hip pain S/P left THA  HPI: Austin [name], 79 y.o. male, has a history of pain and functional disability in the left hip due to pain from previous total hip arthroplasty. The indications for the revision total hip arthroplasty are loosening of one or more components and pain.  Onset of symptoms was gradual starting 2-2.5 years ago with gradually worsening course since that time.  Prior procedures on the left hip include arthroplasty, in 1994.  Patient currently rates pain in the left hip at 6 out of 10 with activity.  There is worsening of pain with activity and weight bearing, trendelenberg gait, pain that interfers with activities of daily living, pain with passive range of motion and getting up and initial steps after getting up from a seated position. Patient has evidence of prosthetic loosening by imaging studies.  This condition presents safety issues increasing the risk of falls.  There is no current active signs of infection.  Risks, benefits and expectations were discussed with the patient. Patient understand the risks, benefits and expectations and wishes to proceed with surgery.   D/C Plans:   Home with HHPT  Post-op Meds:  Rx given for Zanaflex, Iron, Colace and MiraLax  Tranexamic Acid:   Not to be given - CAD (AAA)  Decadron:    To be given  FYI:    Xarelto post-op, on prior to procedure    Patient Active Problem List   Diagnosis Date Noted  . Abdominal aneurysm without mention of rupture 10/23/2012   Past Medical History  Diagnosis Date  . Hypertension   . Hyperlipidemia   . AAA (abdominal aortic aneurysm)   . Arthritis   . CAD (coronary artery disease)   . Thyroid disease     hypothyroidism  . Dysrhythmia     atrial flutter  . Shortness of breath     a little with exertion  . Polio 1934    Past Surgical History  Procedure  Laterality Date  . Abdominal aortic aneurysm repair  05/21/2010    aorto bi-iliac BPG  . Appendectomy    . Popliteal synovial cyst excision Left   . Joint replacement      Knee and Hip replacements  . Total hip arthroplasty Left 1999  . Knee arthroplasty Left    No Known Allergies   History  Substance Use Topics  . Smoking status: Former Smoker -- 1.00 packs/day for 25 years    Types: Cigarettes    Quit date: 04/19/1966  . Smokeless tobacco: Never Used  . Alcohol Use: 0.0 oz/week     Comment: glass of wine daily, scotch 3 or 4 times a week    Family History  Problem Relation Age of Onset  . Other Father     varicose veins  . Hyperlipidemia Son   . Hypertension Son   . AAA (abdominal aortic aneurysm) Son      Review of Systems  Constitutional: Negative.   HENT: Negative.   Eyes: Negative.   Respiratory: Positive for shortness of breath (on exertion).   Cardiovascular: Negative.   Gastrointestinal: Negative.   Genitourinary: Negative.   Musculoskeletal: Positive for joint pain.  Skin: Negative.   Neurological: Positive for dizziness.  Endo/Heme/Allergies: Negative.   Psychiatric/Behavioral: Negative.     Objective:  Physical Exam  Constitutional: He is oriented to person, place, and time. He appears well-developed.  HENT:  Head: Normocephalic and atraumatic.  Eyes: Pupils are equal, round, and reactive to light.  Neck: Neck supple. No JVD present. No thyromegaly present.  Cardiovascular: Normal rate, regular rhythm, normal heart sounds and intact distal pulses.   Respiratory: Effort normal and breath sounds normal. No respiratory distress. He has no wheezes.  GI: Soft. There is no tenderness. There is no guarding.  Musculoskeletal:       Left hip: He exhibits decreased range of motion, decreased strength, tenderness, bony tenderness and laceration. He exhibits no swelling and no deformity.  Lymphadenopathy:    He has no cervical adenopathy.  Neurological: He  is alert and oriented to person, place, and time.  Skin: Skin is warm and dry.  Psychiatric: He has a normal mood and affect.    Labs:  Estimated body mass index is 34.26 kg/(m^2) as calculated from the following:   Height as of 11/15/12: 6\' 5"  (1.956 m).   Weight as of 11/15/12: 131.09 kg (289 lb).  Imaging Review:  Plain radiographs demonstrate previous total hip arthroplasty of the left hip(s). There is evidence of loosening of the acetabular cup and femoral stem.The bone quality appears to be good for age and reported activity level. T  Assessment/Plan:  End stage arthritis, left hip(s) with failed previous arthroplasty.  The patient history, physical examination, clinical judgement of the provider and imaging studies are consistent with end stage degenerative joint disease of the left hip(s), previous total hip arthroplasty. Revision total hip arthroplasty is deemed medically necessary. The treatment options including medical management, injection therapy, arthroscopy and arthroplasty were discussed at length. The risks and benefits of total hip arthroplasty were presented and reviewed. The risks due to aseptic loosening, infection, stiffness, dislocation/subluxation,  thromboembolic complications and other imponderables were discussed.  The patient acknowledged the explanation, agreed to proceed with the plan and consent was signed. Patient is being admitted for inpatient treatment for surgery, pain control, PT, OT, prophylactic antibiotics, VTE prophylaxis, progressive ambulation and ADL's and discharge planning. The patient is planning to be discharged home with home health services.    Austin [name]   PAC  11/29/2012, 5:31 PM

## 2012-12-01 NOTE — Progress Notes (Signed)
Time change for surgery. Pt called and agreed to arrive at short stay at 8:40am on 12/04/12. No questions or concerns.

## 2012-12-04 ENCOUNTER — Encounter (HOSPITAL_COMMUNITY): Payer: Self-pay | Admitting: Anesthesiology

## 2012-12-04 ENCOUNTER — Inpatient Hospital Stay (HOSPITAL_COMMUNITY): Payer: Medicare Other

## 2012-12-04 ENCOUNTER — Encounter (HOSPITAL_COMMUNITY): Payer: Self-pay | Admitting: *Deleted

## 2012-12-04 ENCOUNTER — Encounter (HOSPITAL_COMMUNITY)
Admission: RE | Disposition: A | Discharge status: Skilled Nursing Facility | Payer: Self-pay | Source: Ambulatory Visit | Attending: Orthopedic Surgery

## 2012-12-04 ENCOUNTER — Inpatient Hospital Stay (HOSPITAL_COMMUNITY): Payer: Medicare Other | Admitting: Anesthesiology

## 2012-12-04 ENCOUNTER — Inpatient Hospital Stay (HOSPITAL_COMMUNITY)
Admission: RE | Admit: 2012-12-04 | Discharge: 2012-12-11 | DRG: 466 | Disposition: A | Discharge status: Skilled Nursing Facility | Payer: Medicare Other | Source: Ambulatory Visit | Attending: Orthopedic Surgery | Admitting: Orthopedic Surgery

## 2012-12-04 DIAGNOSIS — I4892 Unspecified atrial flutter: Secondary | ICD-10-CM | POA: Diagnosis present

## 2012-12-04 DIAGNOSIS — T8119XA Other postprocedural shock, initial encounter: Secondary | ICD-10-CM

## 2012-12-04 DIAGNOSIS — E039 Hypothyroidism, unspecified: Secondary | ICD-10-CM | POA: Diagnosis present

## 2012-12-04 DIAGNOSIS — I2510 Atherosclerotic heart disease of native coronary artery without angina pectoris: Secondary | ICD-10-CM | POA: Diagnosis present

## 2012-12-04 DIAGNOSIS — D62 Acute posthemorrhagic anemia: Secondary | ICD-10-CM | POA: Diagnosis not present

## 2012-12-04 DIAGNOSIS — I4891 Unspecified atrial fibrillation: Secondary | ICD-10-CM | POA: Diagnosis present

## 2012-12-04 DIAGNOSIS — I714 Abdominal aortic aneurysm, without rupture: Secondary | ICD-10-CM

## 2012-12-04 DIAGNOSIS — Z87891 Personal history of nicotine dependence: Secondary | ICD-10-CM | POA: Diagnosis present

## 2012-12-04 DIAGNOSIS — Z96659 Presence of unspecified artificial knee joint: Secondary | ICD-10-CM

## 2012-12-04 DIAGNOSIS — Z96649 Presence of unspecified artificial hip joint: Secondary | ICD-10-CM

## 2012-12-04 DIAGNOSIS — M1610 Unilateral primary osteoarthritis, unspecified hip: Secondary | ICD-10-CM | POA: Diagnosis present

## 2012-12-04 DIAGNOSIS — T84039A Mechanical loosening of unspecified internal prosthetic joint, initial encounter: Principal | ICD-10-CM | POA: Diagnosis present

## 2012-12-04 DIAGNOSIS — K5900 Constipation, unspecified: Secondary | ICD-10-CM | POA: Diagnosis not present

## 2012-12-04 DIAGNOSIS — E785 Hyperlipidemia, unspecified: Secondary | ICD-10-CM | POA: Diagnosis present

## 2012-12-04 DIAGNOSIS — Y831 Surgical operation with implant of artificial internal device as the cause of abnormal reaction of the patient, or of later complication, without mention of misadventure at the time of the procedure: Secondary | ICD-10-CM | POA: Diagnosis present

## 2012-12-04 DIAGNOSIS — I10 Essential (primary) hypertension: Secondary | ICD-10-CM | POA: Diagnosis present

## 2012-12-04 DIAGNOSIS — T8119XD Other postprocedural shock, subsequent encounter: Secondary | ICD-10-CM

## 2012-12-04 DIAGNOSIS — E669 Obesity, unspecified: Secondary | ICD-10-CM | POA: Diagnosis present

## 2012-12-04 DIAGNOSIS — R52 Pain, unspecified: Secondary | ICD-10-CM | POA: Diagnosis present

## 2012-12-04 DIAGNOSIS — M169 Osteoarthritis of hip, unspecified: Secondary | ICD-10-CM | POA: Diagnosis present

## 2012-12-04 DIAGNOSIS — Z6837 Body mass index (BMI) 37.0-37.9, adult: Secondary | ICD-10-CM

## 2012-12-04 HISTORY — PX: TOTAL HIP REVISION: SHX763

## 2012-12-04 LAB — PROTIME-INR
INR: 1.13 (ref 0.00–1.49)
Prothrombin Time: 12.9 seconds (ref 11.6–15.2)

## 2012-12-04 LAB — BASIC METABOLIC PANEL
BUN: 15 mg/dL (ref 6–23)
CO2: 23 mEq/L (ref 19–32)
Chloride: 103 mEq/L (ref 96–112)
Creatinine, Ser: 0.83 mg/dL (ref 0.50–1.35)
GFR calc Af Amer: >90 mL/min (ref >90)
Potassium: 5 mEq/L (ref 3.5–5.1)

## 2012-12-04 LAB — CBC
HCT: 36.5 % — ABNORMAL LOW (ref 39.0–52.0)
Hemoglobin: 12.1 g/dL — ABNORMAL LOW (ref 13.0–17.0)
MCV: 95.1 fL (ref 78.0–100.0)
RDW: 14.8 % (ref 11.5–15.5)
WBC: 15.4 10*3/uL — ABNORMAL HIGH (ref 4.0–10.5)

## 2012-12-04 LAB — PREPARE RBC (CROSSMATCH)

## 2012-12-04 SURGERY — TOTAL HIP REVISION
Anesthesia: General | Site: Hip | Laterality: Left | Wound class: Clean

## 2012-12-04 MED ORDER — HYDROMORPHONE HCL PF 1 MG/ML IJ SOLN
INTRAMUSCULAR | Status: DC | PRN
  Administered 2012-12-04 (×2): 1 mg via INTRAVENOUS

## 2012-12-04 MED ORDER — 0.9 % SODIUM CHLORIDE (POUR BTL) OPTIME
TOPICAL | Status: DC | PRN
  Administered 2012-12-04 (×3): 1000 mL

## 2012-12-04 MED ORDER — HYDROMORPHONE HCL PF 1 MG/ML IJ SOLN: 0.2500 mg | INTRAMUSCULAR | Status: DC | PRN

## 2012-12-04 MED ORDER — METOCLOPRAMIDE HCL 5 MG/ML IJ SOLN: 5.0000 mg | Freq: Three times a day (TID) | INTRAMUSCULAR | Status: DC | PRN

## 2012-12-04 MED ORDER — PHENYLEPHRINE HCL 10 MG/ML IJ SOLN
40.0000 ug | Freq: Once | INTRAMUSCULAR | Status: DC
  Filled 2012-12-04: qty 0.01

## 2012-12-04 MED ORDER — RIVAROXABAN 20 MG PO TABS
20.0000 mg | ORAL_TABLET | Freq: Every day | ORAL | Status: DC
  Administered 2012-12-06 – 2012-12-11 (×6): 20 mg via ORAL
  Filled 2012-12-04 (×8): qty 1

## 2012-12-04 MED ORDER — DEXAMETHASONE SODIUM PHOSPHATE 10 MG/ML IJ SOLN
10.0000 mg | Freq: Once | INTRAMUSCULAR | Status: AC
  Administered 2012-12-05: 10 mg via INTRAVENOUS
  Filled 2012-12-04: qty 1

## 2012-12-04 MED ORDER — METOPROLOL SUCCINATE ER 25 MG PO TB24
25.0000 mg | ORAL_TABLET | Freq: Once | ORAL | Status: AC
  Administered 2012-12-04: 25 mg via ORAL
  Filled 2012-12-04: qty 1

## 2012-12-04 MED ORDER — METHOCARBAMOL 100 MG/ML IJ SOLN
500.0000 mg | Freq: Four times a day (QID) | INTRAVENOUS | Status: DC | PRN
  Filled 2012-12-04: qty 5

## 2012-12-04 MED ORDER — PHENOL 1.4 % MT LIQD
1.0000 | OROMUCOSAL | Status: DC | PRN
  Filled 2012-12-04: qty 177

## 2012-12-04 MED ORDER — MIDAZOLAM HCL 5 MG/5ML IJ SOLN
INTRAMUSCULAR | Status: DC | PRN
  Administered 2012-12-04: 2 mg via INTRAVENOUS

## 2012-12-04 MED ORDER — HYDROCODONE-ACETAMINOPHEN 7.5-325 MG PO TABS
1.0000 | ORAL_TABLET | ORAL | Status: DC
  Administered 2012-12-04 – 2012-12-05 (×2): 2 via ORAL
  Administered 2012-12-05 (×2): 1 via ORAL
  Administered 2012-12-07: 2 via ORAL
  Administered 2012-12-07 (×2): 1 via ORAL
  Administered 2012-12-07: 2 via ORAL
  Administered 2012-12-07: 1 via ORAL
  Administered 2012-12-08: 2 via ORAL
  Administered 2012-12-08 – 2012-12-11 (×14): 1 via ORAL
  Filled 2012-12-04 (×6): qty 1
  Filled 2012-12-04: qty 2
  Filled 2012-12-04 (×2): qty 1
  Filled 2012-12-04: qty 2
  Filled 2012-12-04 (×2): qty 1
  Filled 2012-12-04: qty 2
  Filled 2012-12-04: qty 1
  Filled 2012-12-04: qty 2
  Filled 2012-12-04: qty 1
  Filled 2012-12-04: qty 2
  Filled 2012-12-04 (×2): qty 1
  Filled 2012-12-04 (×2): qty 2
  Filled 2012-12-04 (×5): qty 1
  Filled 2012-12-04: qty 2

## 2012-12-04 MED ORDER — CELECOXIB 200 MG PO CAPS
200.0000 mg | ORAL_CAPSULE | Freq: Two times a day (BID) | ORAL | Status: DC
  Administered 2012-12-04 – 2012-12-11 (×14): 200 mg via ORAL
  Filled 2012-12-04 (×15): qty 1

## 2012-12-04 MED ORDER — POLYETHYLENE GLYCOL 3350 17 G PO PACK
17.0000 g | PACK | Freq: Two times a day (BID) | ORAL | Status: DC
  Administered 2012-12-05 – 2012-12-10 (×11): 17 g via ORAL
  Filled 2012-12-04 (×15): qty 1

## 2012-12-04 MED ORDER — METHOCARBAMOL 500 MG PO TABS
500.0000 mg | ORAL_TABLET | Freq: Four times a day (QID) | ORAL | Status: DC | PRN
  Administered 2012-12-04 – 2012-12-05 (×3): 500 mg via ORAL
  Filled 2012-12-04 (×3): qty 1

## 2012-12-04 MED ORDER — LACTATED RINGERS IV SOLN
INTRAVENOUS | Status: DC
  Administered 2012-12-04: 1000 mL via INTRAVENOUS
  Administered 2012-12-04 (×5): via INTRAVENOUS

## 2012-12-04 MED ORDER — FERROUS SULFATE 325 (65 FE) MG PO TABS
325.0000 mg | ORAL_TABLET | Freq: Three times a day (TID) | ORAL | Status: DC
  Administered 2012-12-04 – 2012-12-11 (×21): 325 mg via ORAL
  Filled 2012-12-04 (×23): qty 1

## 2012-12-04 MED ORDER — DEXTROSE 5 % IV SOLN
3.0000 g | INTRAVENOUS | Status: AC
  Administered 2012-12-04: 3 g via INTRAVENOUS
  Filled 2012-12-04: qty 3000

## 2012-12-04 MED ORDER — CEFAZOLIN SODIUM-DEXTROSE 2-3 GM-% IV SOLR
2.0000 g | Freq: Four times a day (QID) | INTRAVENOUS | Status: AC
  Administered 2012-12-04 – 2012-12-05 (×2): 2 g via INTRAVENOUS
  Filled 2012-12-04 (×3): qty 50

## 2012-12-04 MED ORDER — DEXAMETHASONE SODIUM PHOSPHATE 10 MG/ML IJ SOLN
10.0000 mg | Freq: Once | INTRAMUSCULAR | Status: AC
  Administered 2012-12-04: 10 mg via INTRAVENOUS

## 2012-12-04 MED ORDER — ASPIRIN 81 MG PO CHEW
81.0000 mg | CHEWABLE_TABLET | Freq: Every day | ORAL | Status: DC
  Administered 2012-12-05 – 2012-12-11 (×7): 81 mg via ORAL
  Filled 2012-12-04 (×7): qty 1

## 2012-12-04 MED ORDER — FLEET ENEMA 7-19 GM/118ML RE ENEM: 1.0000 | ENEMA | Freq: Once | RECTAL | Status: AC | PRN

## 2012-12-04 MED ORDER — LEVOTHYROXINE SODIUM 75 MCG PO TABS
75.0000 ug | ORAL_TABLET | Freq: Every day | ORAL | Status: DC
  Administered 2012-12-05 – 2012-12-11 (×7): 75 ug via ORAL
  Filled 2012-12-04 (×8): qty 1

## 2012-12-04 MED ORDER — DEXTROSE 5 % IV SOLN
30.0000 ug/min | INTRAVENOUS | Status: DC
  Administered 2012-12-04 – 2012-12-06 (×6): 50 ug/min via INTRAVENOUS
  Filled 2012-12-04 (×11): qty 1

## 2012-12-04 MED ORDER — SIMVASTATIN 20 MG PO TABS
20.0000 mg | ORAL_TABLET | Freq: Every day | ORAL | Status: DC
  Administered 2012-12-05 – 2012-12-10 (×6): 20 mg via ORAL
  Filled 2012-12-04 (×7): qty 1

## 2012-12-04 MED ORDER — STERILE WATER FOR IRRIGATION IR SOLN
Status: DC | PRN
  Administered 2012-12-04: 3000 mL

## 2012-12-04 MED ORDER — SODIUM CHLORIDE 0.9 % IV SOLN
100.0000 mL/h | INTRAVENOUS | Status: DC
  Filled 2012-12-04 (×18): qty 1000

## 2012-12-04 MED ORDER — ONDANSETRON HCL 4 MG/2ML IJ SOLN: 4.0000 mg | Freq: Four times a day (QID) | INTRAMUSCULAR | Status: DC | PRN

## 2012-12-04 MED ORDER — SUCCINYLCHOLINE CHLORIDE 20 MG/ML IJ SOLN
INTRAMUSCULAR | Status: DC | PRN
  Administered 2012-12-04: 100 mg via INTRAVENOUS

## 2012-12-04 MED ORDER — RIVAROXABAN 10 MG PO TABS
10.0000 mg | ORAL_TABLET | Freq: Once | ORAL | Status: AC
  Administered 2012-12-05: 10 mg via ORAL
  Filled 2012-12-04: qty 1

## 2012-12-04 MED ORDER — DEXTROSE 5 % IV SOLN
30.0000 ug/min | INTRAVENOUS | Status: DC
  Administered 2012-12-04: 30 ug/min via INTRAVENOUS
  Filled 2012-12-04 (×2): qty 1

## 2012-12-04 MED ORDER — METOPROLOL TARTRATE 1 MG/ML IV SOLN: 2.5000 mg | INTRAVENOUS | Status: DC | PRN

## 2012-12-04 MED ORDER — DIPHENHYDRAMINE HCL 25 MG PO CAPS: 25.0000 mg | ORAL_CAPSULE | Freq: Four times a day (QID) | ORAL | Status: DC | PRN

## 2012-12-04 MED ORDER — ESMOLOL HCL 10 MG/ML IV SOLN
INTRAVENOUS | Status: DC | PRN
  Administered 2012-12-04: 30 mg via INTRAVENOUS
  Administered 2012-12-04 (×2): 20 mg via INTRAVENOUS

## 2012-12-04 MED ORDER — LACTATED RINGERS IV SOLN
INTRAVENOUS | Status: DC
  Administered 2012-12-04: 16:00:00 via INTRAVENOUS

## 2012-12-04 MED ORDER — DILTIAZEM HCL 100 MG IV SOLR
5.0000 mg/h | INTRAVENOUS | Status: DC
  Administered 2012-12-04: 10 mg/h via INTRAVENOUS
  Filled 2012-12-04: qty 100

## 2012-12-04 MED ORDER — ZOLPIDEM TARTRATE 5 MG PO TABS
5.0000 mg | ORAL_TABLET | Freq: Every evening | ORAL | Status: DC | PRN
  Administered 2012-12-05 – 2012-12-10 (×4): 5 mg via ORAL
  Filled 2012-12-04 (×4): qty 1

## 2012-12-04 MED ORDER — SODIUM CHLORIDE 0.9 % IV SOLN
INTRAVENOUS | Status: DC
  Administered 2012-12-04 – 2012-12-08 (×8): via INTRAVENOUS

## 2012-12-04 MED ORDER — DOCUSATE SODIUM 100 MG PO CAPS
100.0000 mg | ORAL_CAPSULE | Freq: Two times a day (BID) | ORAL | Status: DC
  Administered 2012-12-04 – 2012-12-10 (×13): 100 mg via ORAL
  Filled 2012-12-04 (×16): qty 1

## 2012-12-04 MED ORDER — FENTANYL CITRATE 0.05 MG/ML IJ SOLN
INTRAMUSCULAR | Status: DC | PRN
  Administered 2012-12-04 (×2): 25 ug via INTRAVENOUS
  Administered 2012-12-04 (×2): 50 ug via INTRAVENOUS

## 2012-12-04 MED ORDER — ONDANSETRON HCL 4 MG PO TABS
4.0000 mg | ORAL_TABLET | Freq: Four times a day (QID) | ORAL | Status: DC | PRN
  Administered 2012-12-04 – 2012-12-05 (×2): 4 mg via ORAL
  Filled 2012-12-04 (×2): qty 1

## 2012-12-04 MED ORDER — ONDANSETRON HCL 4 MG/2ML IJ SOLN
INTRAMUSCULAR | Status: DC | PRN
  Administered 2012-12-04: 4 mg via INTRAVENOUS

## 2012-12-04 MED ORDER — ASPIRIN 81 MG PO TABS: 81.0000 mg | ORAL_TABLET | Freq: Every day | ORAL | Status: DC

## 2012-12-04 MED ORDER — ROCURONIUM BROMIDE 100 MG/10ML IV SOLN
INTRAVENOUS | Status: DC | PRN
  Administered 2012-12-04: 10 mg via INTRAVENOUS
  Administered 2012-12-04: 40 mg via INTRAVENOUS

## 2012-12-04 MED ORDER — PHENYLEPHRINE HCL 10 MG/ML IJ SOLN
INTRAMUSCULAR | Status: DC | PRN
  Administered 2012-12-04 (×6): 40 ug via INTRAVENOUS

## 2012-12-04 MED ORDER — DILTIAZEM HCL 100 MG IV SOLR
10.0000 mg/h | INTRAVENOUS | Status: DC
  Administered 2012-12-04: 10 mg/h via INTRAVENOUS
  Filled 2012-12-04: qty 100

## 2012-12-04 MED ORDER — LIDOCAINE HCL (CARDIAC) 20 MG/ML IV SOLN
INTRAVENOUS | Status: DC | PRN
  Administered 2012-12-04: 50 mg via INTRAVENOUS

## 2012-12-04 MED ORDER — SODIUM CHLORIDE 0.9 % IV SOLN
INTRAVENOUS | Status: DC | PRN
  Administered 2012-12-04: 14:00:00 via INTRAVENOUS

## 2012-12-04 MED ORDER — METOCLOPRAMIDE HCL 10 MG PO TABS: 5.0000 mg | ORAL_TABLET | Freq: Three times a day (TID) | ORAL | Status: DC | PRN

## 2012-12-04 MED ORDER — PROPOFOL 10 MG/ML IV BOLUS
INTRAVENOUS | Status: DC | PRN
  Administered 2012-12-04: 160 mg via INTRAVENOUS

## 2012-12-04 MED ORDER — BISACODYL 10 MG RE SUPP: 10.0000 mg | Freq: Every day | RECTAL | Status: DC | PRN

## 2012-12-04 MED ORDER — ALUM & MAG HYDROXIDE-SIMETH 200-200-20 MG/5ML PO SUSP: 30.0000 mL | ORAL | Status: DC | PRN

## 2012-12-04 MED ORDER — DILTIAZEM LOAD VIA INFUSION
15.0000 mg | Freq: Once | INTRAVENOUS | Status: AC
  Administered 2012-12-04: 15 mg via INTRAVENOUS
  Filled 2012-12-04: qty 15

## 2012-12-04 MED ORDER — MENTHOL 3 MG MT LOZG
1.0000 | LOZENGE | OROMUCOSAL | Status: DC | PRN
  Filled 2012-12-04: qty 9

## 2012-12-04 SURGICAL SUPPLY — 63 items
ARTICULEZE HEAD (Hips) ×2 IMPLANT
BAG ZIPLOCK 12X15 (MISCELLANEOUS) ×2 IMPLANT
BLADE SAW SGTL 18X1.27X75 (BLADE) ×2 IMPLANT
BRUSH FEMORAL CANAL (MISCELLANEOUS) IMPLANT
CLOTH BEACON ORANGE TIMEOUT ST (SAFETY) ×2 IMPLANT
COVER SURGICAL LIGHT HANDLE (MISCELLANEOUS) ×2 IMPLANT
CUP PINNACLE SZ 68MM HIP (Cup) ×2 IMPLANT
DERMABOND ADVANCED (GAUZE/BANDAGES/DRESSINGS) ×1
DERMABOND ADVANCED .7 DNX12 (GAUZE/BANDAGES/DRESSINGS) ×1 IMPLANT
DRAPE INCISE IOBAN 66X45 STRL (DRAPES) ×2 IMPLANT
DRAPE INCISE IOBAN 85X60 (DRAPES) ×2 IMPLANT
DRAPE ORTHO SPLIT 77X108 STRL (DRAPES) ×2
DRAPE POUCH INSTRU U-SHP 10X18 (DRAPES) ×2 IMPLANT
DRAPE SURG 17X11 SM STRL (DRAPES) ×2 IMPLANT
DRAPE SURG ORHT 6 SPLT 77X108 (DRAPES) ×2 IMPLANT
DRAPE U-SHAPE 47X51 STRL (DRAPES) ×2 IMPLANT
DRSG AQUACEL AG ADV 3.5X10 (GAUZE/BANDAGES/DRESSINGS) IMPLANT
DRSG AQUACEL AG ADV 3.5X14 (GAUZE/BANDAGES/DRESSINGS) ×2 IMPLANT
DRSG EMULSION OIL 3X16 NADH (GAUZE/BANDAGES/DRESSINGS) ×2 IMPLANT
DRSG MEPILEX BORDER 4X12 (GAUZE/BANDAGES/DRESSINGS) ×2 IMPLANT
DRSG MEPILEX BORDER 4X4 (GAUZE/BANDAGES/DRESSINGS) ×2 IMPLANT
DRSG MEPILEX BORDER 4X8 (GAUZE/BANDAGES/DRESSINGS) ×2 IMPLANT
DRSG TEGADERM 4X4.75 (GAUZE/BANDAGES/DRESSINGS) ×2 IMPLANT
DURAPREP 26ML APPLICATOR (WOUND CARE) ×4 IMPLANT
ELECT BLADE TIP CTD 4 INCH (ELECTRODE) ×2 IMPLANT
ELECT REM PT RETURN 9FT ADLT (ELECTROSURGICAL) ×2
ELECTRODE REM PT RTRN 9FT ADLT (ELECTROSURGICAL) ×1 IMPLANT
EVACUATOR 1/8 PVC DRAIN (DRAIN) ×2 IMPLANT
FACESHIELD LNG OPTICON STERILE (SAFETY) ×8 IMPLANT
GAUZE SPONGE 2X2 8PLY STRL LF (GAUZE/BANDAGES/DRESSINGS) ×1 IMPLANT
GLOVE BIOGEL PI IND STRL 7.5 (GLOVE) ×1 IMPLANT
GLOVE BIOGEL PI IND STRL 8 (GLOVE) ×1 IMPLANT
GLOVE BIOGEL PI INDICATOR 7.5 (GLOVE) ×1
GLOVE BIOGEL PI INDICATOR 8 (GLOVE) ×1
GLOVE ECLIPSE 8.0 STRL XLNG CF (GLOVE) ×4 IMPLANT
GOWN BRE IMP PREV XXLGXLNG (GOWN DISPOSABLE) ×4 IMPLANT
GOWN STRL NON-REIN LRG LVL3 (GOWN DISPOSABLE) ×2 IMPLANT
HANDPIECE INTERPULSE COAX TIP (DISPOSABLE)
HEAD ARTICULEZE (Hips) ×1 IMPLANT
KIT BASIN OR (CUSTOM PROCEDURE TRAY) ×2 IMPLANT
LINER NEUTRAL 62MMC36MM P4 (Liner) ×2 IMPLANT
MANIFOLD NEPTUNE II (INSTRUMENTS) ×2 IMPLANT
NS IRRIG 1000ML POUR BTL (IV SOLUTION) ×4 IMPLANT
PACK TOTAL JOINT (CUSTOM PROCEDURE TRAY) ×2 IMPLANT
POSITIONER SURGICAL ARM (MISCELLANEOUS) ×2 IMPLANT
PRESSURIZER FEMORAL UNIV (MISCELLANEOUS) IMPLANT
SCREW 6.5MMX40MM (Screw) ×4 IMPLANT
SET HNDPC FAN SPRY TIP SCT (DISPOSABLE) IMPLANT
SPONGE GAUZE 2X2 STER 10/PKG (GAUZE/BANDAGES/DRESSINGS) ×1
SPONGE GAUZE 4X4 12PLY (GAUZE/BANDAGES/DRESSINGS) ×2 IMPLANT
SPONGE LAP 18X18 X RAY DECT (DISPOSABLE) ×2 IMPLANT
SPONGE LAP 4X18 X RAY DECT (DISPOSABLE) ×2 IMPLANT
STAPLER VISISTAT 35W (STAPLE) ×2 IMPLANT
STEM STRAIGHT 8IN 18.0M (Stem) ×2 IMPLANT
SUCTION FRAZIER TIP 10 FR DISP (SUCTIONS) ×2 IMPLANT
SUT VIC AB 1 CT1 36 (SUTURE) ×6 IMPLANT
SUT VIC AB 2-0 CT1 27 (SUTURE) ×3
SUT VIC AB 2-0 CT1 TAPERPNT 27 (SUTURE) ×3 IMPLANT
SUT VLOC 180 0 24IN GS25 (SUTURE) ×4 IMPLANT
TOWEL OR 17X26 10 PK STRL BLUE (TOWEL DISPOSABLE) ×4 IMPLANT
TOWER CARTRIDGE SMART MIX (DISPOSABLE) IMPLANT
TRAY FOLEY CATH 14FRSI W/METER (CATHETERS) ×2 IMPLANT
WATER STERILE IRR 1500ML POUR (IV SOLUTION) ×2 IMPLANT

## 2012-12-04 NOTE — Op Note (Signed)
NAME:  COREON, SIMKINS NO.:  [id number]  MEDICAL RECORD NO.:  [id number]  LOCATION:  [facility]                         FACILITY:  [facility]  PHYSICIAN:  Madlyn Frankel. Charlann Boxer, M.D.  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  This is hybrid hip with cemented femoral component done in [place].  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  PROCEDURE:  Revision of left total hip arthroplasty utilizing DePuy size 8 solution 8 inch x 18 large statured stem with a 32+ 5 metal ball and a size 68 Gription pinnacle revision shell with 2 cancellous screws in the ilium and a 36 +4 x 62 liner insert into the 68 cup.  SURGEON:  Madlyn Frankel. Charlann Boxer, MD  ASSISTANT:  Lanney Gins, PA-C.  Note that Mr. Carmon Sails was present for the entirety of the case for preoperative position, perioperative management of operative extremity, general facilitation, primary wound closure.  ANESTHESIA:  General.  SPECIMENS:  None.  DRAINS:  One medium Hemovac.  BLOOD LOSS:  Approximately 1500 mL.  IV FLUIDS:  The patient did receive 1 unit of packed red blood cells in the operating room with the plans to receive the second unit in the recovery room per anesthesia.  INTRAOPERATIVE FINDINGS:  The patient was noted preoperatively to have atrial flutter.  He went in and out of atrial flutter throughout the case and was placed on a Cardizem drip by anesthesia to control his rate at times.  INDICATIONS FOR PROCEDURE:  Mr. Arons is an 79 year old gentleman, who was seen at the request of family for painful left hip.  Workup at a prior institution indicated concerns for loosening.  Lab work  was negative for infection.  Bone scan revealed concerns for loosening of the femoral component.  After reviewing with Mr. Isidore and family his current situation and the findings by workup, he wished at this point to proceed with revision hip surgery.  Risks of infection, DVT, component failure, need for future surgeries, increased risks of dislocation all reviewed.  Consent was obtained for benefit of pain relief.  PROCEDURE IN DETAIL:  The patient was brought to operative theater. Once adequate anesthesia, preoperative antibiotics, Ancef 3 g administered, he was positioned into the right lateral decubitus position with the right side up.  The left lower extremity was then prepped and draped.  The right lateral decubitus position with left side up.  The left lower extremity was then prepped and draped in sterile fashion with enough draping to allow for exposure of the femur in case an osteotomy which needed to be performed.  A time-out was performed identifying the patient, planned procedure, and the extremity.  The left hip was then prepped and draped in sterile fashion.  The patient's old incision was identified and a portion of this incision was utilized.  A soft tissue dissection was carried down to the iliotibial band and gluteal fascia.  This  was incised for posterior approach.  We encountered clear synovial fluid throughout this exposure and with the posterior aspect, the hip exposed.  I debrided during the exposure a significant amount of synovitic response in the posterior 2/3rd of the hip.  Once the hip was exposed, the hip was dislocated.  We found out that the femoral stem was loose right away. Using the S-ROM loop extractor, we removed the femoral stem without difficulty.  A lot of the portion of the case at this point was directed at removing cement from the canal using the [facility] cement removal system and instruments and I was able to remove the cement from  the cephalad portion of the femur without apparent complication or penetration of femur.  During this procedure, I did take intraoperative radiograph to confirm that the canal had not been violated in the orientation of bone preparation.  Once the femoral cement had been removed, I did go ahead and ream by power for a 8 inch solution stem.  Initially I reamed up to 16 mm, but later decided that needed a little bit more of a fixation distally to provide support, thus reamed up to 17.5.  The wreck was at 4-5 cm of good bone fixation distally based on assessment with the 18 mm reamer on hand.  Following preparation of femur, I attended to the acetabulum.  The patient had an unknown acetabular component in place.  I was able to expose the acetabulum and remove the old polyethylene liner.  Two cancellous screws that had been placed in the ilium were removed as well.  I then used [employer] explant system and removed the acetabular component without much bone loss.  The patient was noted to have a very large cup in, probably 68 mm in diameter.  Noted that his bone stock was good.  The cup was placed fairly lateral within the ilium.  Thus I had bone to support fixation for this revision component after reaming just to 67 reamer with placing the reamer to the medial wall acetabulum and selected a 68 mm Gription pinnacle cup.  I elected to do this as opposed to the build up revision component.  I did not feel I needed the extra depth.  The final 68 revision Gription cup was then impacted with a straight impactor.  Cup position appeared to be in more appropriate position with about 35-40 degrees of abduction and 20 degrees of forward flexion beneath the anterior rim.  I placed 2 cancellous screws into the ilium and then placed the final 36 x 62 +4 liner to match this 68 cup.  At this point, I returned attention to the femur.  After trialing with broaches, the proximal aspect of the femur was  fairly ectatic enough so that no broach, even the 18 large body did not get any purchase proximally.  I thus made decision and selected the 18 mm x 8 inch solution large body stem and was opened and then impacted using a reamer as a guide for my anteversion which I kept at about 20-25 degrees.  It impacted to its full depth with no further penetration.  I did a trial reduction with first 36+ 1.5 ball and then a 36+ 5 ball, found the combined anteversion was 45-50 degrees.  The hip felt stable from extension and external rotation all the way to forward flexion, internal rotation to 70 or 80 degrees and as well to sleep position.  No significant shuck was identified.  His leg lengths  appeared to be comparable to the down leg distally.  Given the revision setting, I felt this was in adequate position for component orientation.  The final 36+ 5 metal ball was then opened and impacted onto clean and dry trunnion and the hip was reduced.  We irrigated the hip throughout the case again at this point.  I did not have enough posterior superior capsule to reapproximate this. I just placed a medium Hemovac drain deep.  I then reapproximated the iliotibial band and gluteal fascia using a combination of #1 Vicryl, then 0 V-Loc running suture.  The remainder of the wound was closed with 2-0 Vicryl and staples on the skin.  The hip wound was then cleaned, dried, and dressed sterilely with Mepilex dressing.  The drain site was dressed separately.  He was then brought to the recovery room, extubated in stable condition.  He tolerated the procedure thus well with plans for him to go to step-down for observation due to his atrial flutter.     Madlyn Frankel Charlann Boxer, M.D.     MDO/MEDQ  D:  12/04/2012  T:  12/04/2012  Job:  [id number]

## 2012-12-04 NOTE — Progress Notes (Signed)
Pt received from OR at 1528.  Pt awake and alert. Cardizem gtt at 10mg /min. VSS as documented. CRNA at bedside, fluids opened.  2 nd piv placed by [employer].  Dr Leta Jungling aware of BP.  PRBC''s administered via  Pressure bag and order of Dr Leta Jungling.  XRAYS done as ordered for hip and pelvis.  Dr Charlann Boxer and PA [facility] aware of unstable BP.  Neo gtt started and titrated up to 57mcg/min, with systolic 94.  Per Dr Leta Jungling pt transferrd to step-down, Dr Delton Coombes aware and ordered Cardizem decreased to 5mg /min.  Family not available per volunteer-left to feed horses and have dinner.  Pt awake and alert in Step Down.

## 2012-12-04 NOTE — Brief Op Note (Signed)
12/04/2012  3:22 PM  PATIENT:  Austin [name]  79 y.o. male  PRE-OPERATIVE DIAGNOSIS:  failed left total hip arthroplasty, aseptic loosening   POST-OPERATIVE DIAGNOSIS:  failed left total hip arthroplasty, aseptic loosening  PROCEDURE:  Procedure(s): LEFT TOTAL HIP REVISION (Left)  SURGEON:  Surgeon(s) and Role:    * Shelda Pal, MD - Primary  PHYSICIAN ASSISTANT: Lanney Gins, PA-C  ASSISTANTS:  surgical tech  ANESTHESIA:   general  EBL:  Total I/O In: 4000 [I.V.:4000] Out: 1900 [Urine:400; Blood:1500]  BLOOD ADMINISTERED:1 unit PRBCs given during the case   DRAINS: (1 medium) Hemovact drain(s) in the left hip with  Suction Open   LOCAL MEDICATIONS USED:  NONE  SPECIMEN:  No Specimen  DISPOSITION OF SPECIMEN:  N/A  COUNTS:  YES  TOURNIQUET:  * No tourniquets in log *  DICTATION: .Other Dictation: Dictation Number [id number]  PLAN OF CARE: Admit to inpatient   PATIENT DISPOSITION:  PACU - hemodynamically stable.   Delay start of Pharmacological VTE agent (>24hrs) due to surgical blood loss or risk of bleeding: no

## 2012-12-04 NOTE — Consult Note (Addendum)
PULMONARY  / CRITICAL CARE MEDICINE  Name: Austin [name] MRN: [id number] DOB: 07/07/1926    ADMISSION DATE:  12/04/2012 CONSULTATION DATE:  12/04/2012   REFERRING MD :  Charlann Boxer PRIMARY SERVICE: Ortho  CHIEF COMPLAINT:  Hypotension, a flutter post op  BRIEF PATIENT DESCRIPTION: 86/M with chronic atrial flutter s/p Revision of left total hip arthroplasty with post op hypotension, EBL 1.5 L   SIGNIFICANT EVENTS / STUDIES:    LINES / TUBES:   CULTURES:   ANTIBIOTICS: Cefazolin 8/18 >>  HISTORY OF PRESENT ILLNESS:  86/M with chronic atrial flutter s/p Revision of left total hip arthroplasty with post op hypotension, EBL 1.5 L. He had failure of left total hip performed approximately 20 years ago. He was noted preoperatively to have atrial flutter. He went in and out of atrial flutter intra-opand was placed on a Cardizem drip by anesthesia . Post op hypotension - given 2 U PRBCs & started on cardizem gtt. He denies chest pain or dyspnea, about 200 cc i lt hip drain He underwent open aneurysm repair with an aortobiiliac graft on 05/21/2010      PAST MEDICAL HISTORY :  Past Medical History  Diagnosis Date  . Hypertension   . Hyperlipidemia   . AAA (abdominal aortic aneurysm)   . Arthritis   . CAD (coronary artery disease)   . Thyroid disease     hypothyroidism  . Dysrhythmia     atrial flutter  . Shortness of breath     a little with exertion  . Polio 1934   Past Surgical History  Procedure Laterality Date  . Abdominal aortic aneurysm repair  05/21/2010    aorto bi-iliac B[facility]  . Appendectomy    . Popliteal synovial cyst excision Left   . Joint replacement      Knee and Hip replacements  . Total hip arthroplasty Left 1999  . Knee arthroplasty Left    Prior to Admission medications   Medication Sig Start Date End Date Taking? Authorizing Provider  aspirin 81 MG tablet Take 81 mg by mouth daily.   Yes Historical Provider, MD  levothyroxine (SYNTHROID,  LEVOTHROID) 75 MCG tablet Take 75 mcg by mouth daily before breakfast.   Yes Historical Provider, MD  losartan (COZAAR) 50 MG tablet Take 50 mg by mouth every morning.    Yes Historical Provider, MD  lovastatin (MEVACOR) 40 MG tablet Take 40 mg by mouth every morning.    Yes Historical Provider, MD  metoprolol succinate (TOPROL-XL) 25 MG 24 hr tablet Take 25 mg by mouth every morning.   Yes Historical Provider, MD  Multiple Vitamin (MULTIVITAMIN WITH MINERALS) TABS tablet Take 1 tablet by mouth daily.   Yes Historical Provider, MD  cholecalciferol (VITAMIN D) 1000 UNITS tablet Take 1,000 Units by mouth daily.    Historical Provider, MD  ferrous sulfate (IRON SUPPLEMENT) 325 (65 FE) MG tablet Take 325 mg by mouth daily with breakfast.    Historical Provider, MD  Rivaroxaban (XARELTO) 20 MG TABS Take 20 mg by mouth daily.    Historical Provider, MD   No Known Allergies  FAMILY HISTORY:  Family History  Problem Relation Age of Onset  . Other Father     varicose veins  . Hyperlipidemia Son   . Hypertension Son   . AAA (abdominal aortic aneurysm) Son    SOCIAL HISTORY:  reports that he quit smoking about 46 years ago. His smoking use included Cigarettes. He has a 25 pack-year smoking history. He has  never used smokeless tobacco. He reports that  drinks alcohol. He reports that he does not use illicit drugs.  REVIEW OF SYSTEMS:  Constitutional: negative for anorexia, fevers and sweats  Eyes: negative for irritation, redness and visual disturbance  Ears, nose, mouth, throat, and face: negative for earaches, epistaxis, nasal congestion and sore throat  Respiratory: negative for cough, dyspnea on exertion, sputum and wheezing  Cardiovascular: negative for chest pain, dyspnea, lower extremity edema, orthopnea, palpitations and syncope  Gastrointestinal: negative for abdominal pain, constipation, diarrhea, melena, nausea and vomiting  Genitourinary:negative for dysuria, frequency and hematuria   Hematologic/lymphatic: negative for bleeding, easy bruising and lymphadenopathy  Musculoskeletal:c/o left hip pain negative for arthralgias, muscle weakness and stiff joints  Neurological: negative for coordination problems, gait problems, headaches and weakness  Endocrine: negative for diabetic symptoms including polydipsia, polyuria and weight loss   SUBJECTIVE:   VITAL SIGNS: Temp:  [96.7 F (35.9 C)-98 F (36.7 C)] 97.6 F (36.4 C) (08/18 1720) Pulse Rate:  [37-149] 65 (08/18 1737) Resp:  [10-21] 15 (08/18 1737) BP: (55-137)/(28-83) 125/77 mmHg (08/18 1737) SpO2:  [87 %-96 %] 96 % (08/18 1737) HEMODYNAMICS:   VENTILATOR SETTINGS:   INTAKE / OUTPUT: Intake/Output     08/17 0701 - 08/18 0700 08/18 0701 - 08/19 0700   I.V.  6800   Blood  350   Total Intake   7150   Urine  675   Drains  250   Blood  1500   Total Output   2425   Net   +4725          PHYSICAL EXAMINATION: Gen. Pleasant, well-nourished, in no distress, normal affect, appears younger than stated age ENT - no lesions, no post nasal drip Neck: No JVD, no thyromegaly, no carotid bruits Lungs: no use of accessory muscles, no dullness to percussion, clear without rales or rhonchi  Cardiovascular: Rhythm irregular, heart sounds  normal, no murmurs, no peripheral edema Abdomen: soft and non-tender, no hepatosplenomegaly, BS normal. Musculoskeletal: No deformities, no cyanosis or clubbing Neuro:  alert, non focal Skin:  Warm, no lesions/ rash   LABS:  CBC Recent Labs     12/04/12  1642  HGB  11.5*  HCT  35.0*   Coag's Recent Labs     12/04/12  0900  APTT  34  INR  0.99   BMET No results found for this basename: NA, K, CL, CO2, BUN, CREATININE, GLUCOSE,  in the last 72 hours Electrolytes No results found for this basename: CALCIUM, MG, PHOS,  in the last 72 hours Sepsis Markers No results found for this basename: LACTICACIDVEN, PROCALCITON, O2SATVEN,  in the last 72 hours ABG No results  found for this basename: PHART, PCO2ART, PO2ART,  in the last 72 hours Liver Enzymes No results found for this basename: AST, ALT, ALKPHOS, BILITOT, ALBUMIN,  in the last 72 hours Cardiac Enzymes No results found for this basename: TROPONINI, PROBNP,  in the last 72 hours Glucose No results found for this basename: GLUCAP,  in the last 72 hours  Imaging Dg Femur Left  12/04/2012   *RADIOLOGY REPORT*  Clinical Data: Postop left hip arthroplasty  LEFT FEMUR - 2 VIEW  Comparison: None.  Findings: A left hip arthroplasty is well seated and aligned. There is no acute fracture.  There is no evidence of an operative complication.  IMPRESSION: Postoperative left hip images as detailed.  No evidence of an operative complication.   Original Report Authenticated By: Amie Portland, M.D.  Dg Pelvis Portable  12/04/2012   *RADIOLOGY REPORT*  Clinical Data: Postop left hip images  PORTABLE PELVIS  Comparison: Abdomen radiograph, 05/27/2010  Findings: The left hip arthroplasty has been revised.  The femoral and acetabular components are well seated and aligned.  There is no acute fracture.  There is no evidence of an operative complication.  Advanced arthropathic changes of the right hip are stable from prior study.  IMPRESSION: Postoperative views of the left hip show of the left hip prosthesis to be well seated and aligned.  No operative complication.   Original Report Authenticated By: Amie Portland, M.D.     CXR: none  ASSESSMENT / PLAN:  PULMONARY A:no issues P:     CARDIOVASCULAR A: Hypotension/ hypovolemia Atrial flutter, chronic on rivaroxaban Allyson Sabal) P:  Neo gtt - titrate to off  Gradually Dc cardizem gtt & use lopressor prn for rate control Chk TP x 1 & EKG  RENAL A:  At risk for AKI P:   Dc losartan chk BMET  GASTROINTESTINAL A:  No issues P:   Advance diet once off pressors, sips ok for now  HEMATOLOGIC A: Acute blood loss anemia s/p 2 U PRBC P: rechk CBC & transfuse for 8  or lower in shock   INFECTIOUS A:  No issues P:     ENDOCRINE A:  hypothyroid   P:   Resume synthroid Received dexamethasone pre-op -unclear why?consider stress dose steroids if remains hypotensive  NEUROLOGIC A:  Acute pain P:   Use fentnayl prn, dc dilaudid  TODAY'S SUMMARY: Post op hypotension, improving A flutter is chronic & now rate controlled  I have personally obtained a history, examined the patient, evaluated laboratory and imaging results, formulated the assessment and plan and placed orders. CRITICAL CARE: The patient is critically ill with multiple organ systems failure and requires high complexity decision making for assessment and support, frequent evaluation and titration of therapies, application of advanced monitoring technologies and extensive interpretation of multiple databases. Critical Care Time devoted to patient care services described in this note is 50 minutes.   Oretha Milch  Pulmonary and Critical Care Medicine [facility] Pager: [phone]  12/04/2012, 5:53 PM

## 2012-12-04 NOTE — Anesthesia Postprocedure Evaluation (Signed)
  Anesthesia Post-op Note  Patient: Austin [name]  Procedure(s) Performed: Procedure(s) (LRB): LEFT TOTAL HIP REVISION (Left)  Patient Location: PACU  Anesthesia Type: General  Level of Consciousness: awake and alert   Airway and Oxygen Therapy: Patient Spontanous Breathing  Post-op Pain: mild  Post-op Assessment: Post-op Vital signs reviewed, Patient's Cardiovascular Status Stable, Respiratory Function Stable, Patent Airway and No signs of Nausea or vomiting  Last Vitals:  Filed Vitals:   12/04/12 1620  BP: 87/42  Pulse:   Temp:   Resp:     Post-op Vital Signs: stable   Complications: No apparent anesthesia complications

## 2012-12-04 NOTE — Transfer of Care (Signed)
Immediate Anesthesia Transfer of Care Note  Patient: Austin [name]  Procedure(s) Performed: Procedure(s): LEFT TOTAL HIP REVISION (Left)  Patient Location: PACU  Anesthesia Type:General  Level of Consciousness: awake, alert  and oriented  Airway & Oxygen Therapy: Patient Spontanous Breathing and Patient connected to face mask oxygen  Post-op Assessment: Report given to PACU RN and Post -op Vital signs reviewed and stable  Post vital signs: Reviewed and stable  Complications: No apparent anesthesia complications

## 2012-12-04 NOTE — Anesthesia Preprocedure Evaluation (Signed)
Anesthesia Evaluation  Patient identified by MRN, date of birth, ID band Patient awake    Reviewed: Allergy & Precautions, H&P , NPO status , Patient's Chart, lab work & pertinent test results, reviewed documented beta blocker date and time   Airway Mallampati: II TM Distance: >3 FB Neck ROM: full    Dental no notable dental hx. (+) Edentulous Lower, Missing and Dental Advisory Given All upper front missing:   Pulmonary shortness of breath and with exertion, former smoker,  breath sounds clear to auscultation  Pulmonary exam normal       Cardiovascular hypertension, Pt. on home beta blockers and Pt. on medications + CAD and + Peripheral Vascular Disease + dysrhythmias Atrial Fibrillation Rhythm:regular Rate:Normal  AAA repaired.  Mild CAD   Neuro/Psych History polio 1934 negative neurological ROS  negative psych ROS   GI/Hepatic negative GI ROS, Neg liver ROS,   Endo/Other  negative endocrine ROS  Renal/GU negative Renal ROS  negative genitourinary   Musculoskeletal   Abdominal   Peds  Hematology negative hematology ROS (+)   Anesthesia Other Findings   Reproductive/Obstetrics negative OB ROS                           Anesthesia Physical Anesthesia Plan  ASA: III  Anesthesia Plan: General   Post-op Pain Management:    Induction:   Airway Management Planned: Oral ETT  Additional Equipment:   Intra-op Plan:   Post-operative Plan: Extubation in OR  Informed Consent: I have reviewed the patients History and Physical, chart, labs and discussed the procedure including the risks, benefits and alternatives for the proposed anesthesia with the patient or authorized representative who has indicated his/her understanding and acceptance.   Dental Advisory Given  Plan Discussed with: CRNA and Surgeon  Anesthesia Plan Comments:         Anesthesia Quick Evaluation

## 2012-12-04 NOTE — Interval H&P Note (Signed)
History and Physical Interval Note:  12/04/2012 10:33 AM  Austin [name]  has presented today for surgery, with the diagnosis of failed left total hip arthroplasty   The various methods of treatment have been discussed with the patient and family. After consideration of risks, benefits and other options for treatment, the patient has consented to  Procedure(s): LEFT TOTAL HIP REVISION (Left) as a surgical intervention .  The patient's history has been reviewed, patient examined, no change in status, stable for surgery.  I have reviewed the patient's chart and labs.  Questions were answered to the patient's satisfaction.     Shelda Pal

## 2012-12-05 DIAGNOSIS — Z96649 Presence of unspecified artificial hip joint: Secondary | ICD-10-CM

## 2012-12-05 DIAGNOSIS — Z5189 Encounter for other specified aftercare: Secondary | ICD-10-CM

## 2012-12-05 LAB — TYPE AND SCREEN
ABO/RH(D): A POS
Antibody Screen: NEGATIVE
Unit division: 0

## 2012-12-05 LAB — CBC
HCT: 32.9 % — ABNORMAL LOW (ref 39.0–52.0)
Hemoglobin: 11 g/dL — ABNORMAL LOW (ref 13.0–17.0)
MCV: 94 fL (ref 78.0–100.0)
RDW: 14.9 % (ref 11.5–15.5)
WBC: 12.9 10*3/uL — ABNORMAL HIGH (ref 4.0–10.5)

## 2012-12-05 LAB — BASIC METABOLIC PANEL
BUN: 14 mg/dL (ref 6–23)
CO2: 20 mEq/L (ref 19–32)
Chloride: 100 mEq/L (ref 96–112)
Creatinine, Ser: 0.76 mg/dL (ref 0.50–1.35)
Glucose, Bld: 194 mg/dL — ABNORMAL HIGH (ref 70–99)
Potassium: 4.6 mEq/L (ref 3.5–5.1)

## 2012-12-05 MED ORDER — LORATADINE 10 MG PO TABS
10.0000 mg | ORAL_TABLET | Freq: Every day | ORAL | Status: DC
  Administered 2012-12-05 – 2012-12-11 (×7): 10 mg via ORAL
  Filled 2012-12-05 (×7): qty 1

## 2012-12-05 NOTE — Progress Notes (Signed)
PULMONARY  / CRITICAL CARE MEDICINE  Name: Austin [name] MRN: [id number] DOB: 05-19-1926    ADMISSION DATE:  12/04/2012 CONSULTATION DATE:  12/05/2012   REFERRING MD :  Charlann Boxer PRIMARY SERVICE: Ortho  CHIEF COMPLAINT:  Hypotension, a flutter post op  BRIEF PATIENT DESCRIPTION: 86/M with chronic atrial flutter s/p Revision of left total hip arthroplasty with post op hypotension, EBL 1.5 L   SIGNIFICANT EVENTS / STUDIES:  8/18 - admit for revision of L hip arthoplasty, aflutter, hypotension 8/19 - remains on low dose neo  LINES / TUBES:   CULTURES:   ANTIBIOTICS: Cefazolin 8/18 >>   SUBJECTIVE: Pt reports feeling good.- "my bed is to short"  VITAL SIGNS: Temp:  [96.2 F (35.7 C)-98.8 F (37.1 C)] 98.8 F (37.1 C) (08/19 0600) Pulse Rate:  [37-149] 75 (08/19 0800) Resp:  [10-21] 15 (08/19 0800) BP: (55-129)/(28-77) 91/46 mmHg (08/19 0800) SpO2:  [86 %-96 %] 95 % (08/19 0800) HEMODYNAMICS:   VENTILATOR SETTINGS:   INTAKE / OUTPUT: Intake/Output     08/18 0701 - 08/19 0700 08/19 0701 - 08/20 0700   P.O. 600    I.V. 9588.8 675   Blood 350    Other 175    IV Piggyback 100    Total Intake 10813.8 675   Urine 3275    Drains 650    Blood 1500    Total Output 5425     Net +5388.8 +675          PHYSICAL EXAMINATION: Gen. Pleasant, well-nourished, in no distress, normal affect, appears younger than stated age ENT - no lesions, no post nasal drip Neck: No JVD, no thyromegaly, no carotid bruits Lungs: no use of accessory muscles, no dullness to percussion, clear without rales or rhonchi  Cardiovascular: Rhythm irregular, heart sounds  normal, no murmurs, no peripheral edema Abdomen: soft and non-tender, no hepatosplenomegaly, BS normal. Musculoskeletal: No deformities, no cyanosis or clubbing Neuro:  alert, non focal Skin:  Warm, no lesions/ rash   LABS:  CBC Recent Labs     12/04/12  1642  12/04/12  1842  12/05/12  0335  WBC   --   15.4*  12.9*   HGB  11.5*  12.1*  11.0*  HCT  35.0*  36.5*  32.9*  PLT   --   160  153   Coag's Recent Labs     12/04/12  0900  12/04/12  1842  APTT  34   --   INR  0.99  1.13   BMET Recent Labs     12/04/12  1842  12/05/12  0335  NA  135  132*  K  5.0  4.6  CL  103  100  CO2  23  20  BUN  15  14  CREATININE  0.83  0.76  GLUCOSE  161*  194*   Electrolytes Recent Labs     12/04/12  1842  12/05/12  0335  CALCIUM  8.0*  7.9*   Cardiac Enzymes Recent Labs     12/04/12  1840  TROPONINI  <0.30   Glucose No results found for this basename: GLUCAP,  in the last 72 hours  Imaging Dg Femur Left  12/04/2012   *RADIOLOGY REPORT*  Clinical Data: Postop left hip arthroplasty  LEFT FEMUR - 2 VIEW  Comparison: None.  Findings: A left hip arthroplasty is well seated and aligned. There is no acute fracture.  There is no evidence of an operative complication.  IMPRESSION: Postoperative left  hip images as detailed.  No evidence of an operative complication.   Original Report Authenticated By: Amie Portland, M.D.   Dg Pelvis Portable  12/04/2012   *RADIOLOGY REPORT*  Clinical Data: Postop left hip images  PORTABLE PELVIS  Comparison: Abdomen radiograph, 05/27/2010  Findings: The left hip arthroplasty has been revised.  The femoral and acetabular components are well seated and aligned.  There is no acute fracture.  There is no evidence of an operative complication.  Advanced arthropathic changes of the right hip are stable from prior study.  IMPRESSION: Postoperative views of the left hip show of the left hip prosthesis to be well seated and aligned.  No operative complication.   Original Report Authenticated By: Amie Portland, M.D.     CXR: none  ASSESSMENT / PLAN:  PULMONARY A: At risk ATX Former Smoker ? Seasonal Allergies  P:   -add IS / pulmonary hygiene -add claritin   CARDIOVASCULAR A: Hypotension/ hypovolemia - troponin negative Atrial flutter, chronic on rivaroxaban Allyson Sabal) P:   Neo gtt - titrate to off   lopressor prn for rate control   RENAL A:  At risk for AKI P:   Hold losartan Follow BMP  GASTROINTESTINAL A:  No issues P:   Advance diet   HEMATOLOGIC A: Acute blood loss anemia s/p 2 U PRBC P: rechk CBC & transfuse for 8 or lower in shock   INFECTIOUS A:  No issues P:     ENDOCRINE A:  hypothyroid   P:   Continue synthroid Received dexamethasone pre-op -unclear why?consider stress dose steroids if remains hypotensive  NEUROLOGIC A:  Acute pain - post op hip revision  P:   Use fentnayl prn    I have personally obtained a history, examined the patient, evaluated laboratory and imaging results, formulated the assessment and plan and placed orders.  CRITICAL CARE: The patient is critically ill with multiple organ systems failure and requires high complexity decision making for assessment and support, frequent evaluation and titration of therapies, application of advanced monitoring technologies and extensive interpretation of multiple databases. Critical Care Time devoted to patient care services described in this note is 35 minutes.    Levy Pupa, MD, PhD 12/05/2012, 11:52 AM [facility] [phone] or if no answer [phone]

## 2012-12-05 NOTE — Progress Notes (Signed)
[facility]  [facility] is providing the following services: Patient already has rw and commode at home.  No DME needs at this time.   If patient discharges after hours, please call [phone].   Renard Hamper 12/05/2012, 9:28 PM

## 2012-12-05 NOTE — Care Management Note (Addendum)
    Page 1 of 2   12/11/2012     2:19:38 PM   CARE MANAGEMENT NOTE 12/11/2012  Patient:  Austin [name], Austin [name]   Account Number:  [id number]  Date Initiated:  12/05/2012  Documentation initiated by:  Lanier Clam  Subjective/Objective Assessment:   ADMITTED W/L HIP PAIN,S/PL HIP REVISION DEVELOPED HYPOTENSION,AFLUTTER.     Action/Plan:   FROM HOME.   Anticipated DC Date:  12/11/2012   Anticipated DC Plan:  SKILLED NURSING FACILITY      DC Planning Services  CM consult      Choice offered to / List presented to:  C-1 Patient           Status of service:  Completed, signed off Medicare Important Message given?   (If response is "NO", the following Medicare IM given date fields will be blank) Date Medicare IM given:   Date Additional Medicare IM given:    Discharge Disposition:  SKILLED NURSING FACILITY  Per UR Regulation:  Reviewed for med. necessity/level of care/duration of stay  If discussed at Long Length of Stay Meetings, dates discussed:    Comments:  12/11/12 Kahdijah Errickson RN,BSN NCM [phone] D/C SNF.  12/08/12 Demetre Monaco RN,BSN NCM [phone] PATIENT HAD PAIN CONTROL ISSUES,THEREFORE NO D/C TODAY.TC DEBBIE [facility] MADE AWARE OF CURRENT STATUS.PATIENT ASKED IF INPT REHAB WOULD BE AN OPTION,TC GENIE INPT REHAB COORDIN-SHE REVIEWED CASE & SAYS THAT NOT APPROPRIATE FOR INPT REHAB & MEDICARE WOULD NOT  PAY SINCE NO MEDICAL NECESSITY AFTER REVISION.PT-HH.D/C HOME W/HH,[facility] REP DEBBIE AWARE OF D/C,& HH ORDERS.PATIENT ALREADY HAD DME.  12/07/12 Mohammad Granade RN,BSN NCM [phone] TRANSFER FROM SDU.POD#3 L REVISION THA.RECOMMENDED FOR HHRN/PT/OT.PATIENT ALREADY HAS RW,& 3N1-[facility] [place] AWARE.[facility] REP DEBBIE ALREADY FOLLOWING FOR HH.WILL AWAIT FINAL HH ORDERS  12/05/12 Khalin Royce RN,BSN NCM [phone] PATIENT ALREADY HAS DME-3N1,RW.INFORMED [facility] DME REP [place]. POD#1 L REVISION THA.PT/OT-HH,RW.[facility] DEBBIE REP FOLLOWING,AWAITING FINAL HHPT/OT/DME ORDERS.

## 2012-12-05 NOTE — Progress Notes (Signed)
   Subjective: 1 Day Post-Op Procedure(s) (LRB): LEFT TOTAL HIP REVISION (Left)   Patient reports pain as mild, pain well controlled. Multiple events from yesterday and throughout the night in regards to his BP and HR.  He is currently still on a neo drip when seen this morning. He states that he doesn't feel anything with regards to the heart and that the leg is minimal pain. Pt were able to get him from the bed to the chair, when they were concerned for his HR.  Objective:   VITALS:   Filed Vitals:   12/05/12 1600  BP: 106/48  Pulse: 66  Temp: 98.6 F (37 C)  Resp: 16    Neurovascular intact Dorsiflexion/Plantar flexion intact Incision: dressing C/D/I No cellulitis present Compartment soft  LABS  Recent Labs  12/04/12 1642 12/04/12 1842 12/05/12 0335  HGB 11.5* 12.1* 11.0*  HCT 35.0* 36.5* 32.9*  WBC  --  15.4* 12.9*  PLT  --  160 153     Recent Labs  12/04/12 1842 12/05/12 0335  NA 135 132*  K 5.0 4.6  BUN 15 14  CREATININE 0.83 0.76  GLUCOSE 161* 194*     Assessment/Plan: 1 Day Post-Op Procedure(s) (LRB): LEFT TOTAL HIP REVISION (Left) HV drain dc'ed Advance diet Up with therapy   [name]. [name]   PAC  12/05/2012, 6:02 PM

## 2012-12-05 NOTE — Evaluation (Signed)
Occupational Therapy Evaluation Patient Details Name: Austin [name] MRN: [id number] DOB: [birth year] Today's Date: 12/05/2012 Time: 4098-1191 OT Time Calculation (min): 25 min  OT Assessment / Plan / Recommendation History of present illness 79 yo male s/p L THA revision. Post-op hypotension. Hx of AAA, chronic A flutter.    Clinical Impression   Pt was admitted with the above.  He has posterior THps on L and is PWB.  Pt will benefit from skilled OT to increase safety and independence with adls.  Goals in acute are supervision to min A    OT Assessment  Patient needs continued OT Services    Follow Up Recommendations  Home health OT    Barriers to Discharge      Equipment Recommendations  None recommended by OT    Recommendations for Other Services    Frequency  Min 2X/week    Precautions / Restrictions Precautions Precautions: Posterior Hip;Fall Precaution Comments: Reviewed posterior hip precautios and PWB status Restrictions Weight Bearing Restrictions: Yes LLE Weight Bearing: Partial weight bearing LLE Partial Weight Bearing Percentage or Pounds: 50% Other Position/Activity Restrictions: LLE PWB 50%   Pertinent Vitals/Pain L hip 5/10  repositioned    ADL  Grooming: Wash/dry face;Set up Where Assessed - Grooming: Supported sitting Upper Body Bathing: Set up Where Assessed - Upper Body Bathing: Supported sitting Lower Body Bathing: Moderate assistance Where Assessed - Lower Body Bathing: Supported sit to stand Upper Body Dressing: Minimal assistance (lines) Where Assessed - Upper Body Dressing: Supported sitting Lower Body Dressing: Maximal assistance Where Assessed - Lower Body Dressing: Supported sit to Pharmacist, hospital: Chief of Staff: Patient Percentage: 70% Statistician Method: Surveyor, minerals:  (bed to chair) Toileting - Architect and Hygiene: Min guard Where Assessed -  Engineer, mining and Hygiene: Standing Equipment Used: Rolling walker Transfers/Ambulation Related to ADLs: spt only as pt is on pressors ADL Comments: pt needs reinforcement with thps.  He doesn't want daughter to help with pants.  He does have a reacher:  needs reinforcement to follow precautions as he hasn't had these in 20 years    OT Diagnosis: Generalized weakness  OT Problem List: Decreased strength;Decreased activity tolerance;Decreased knowledge of use of DME or AE;Decreased knowledge of precautions;Pain OT Treatment Interventions: Self-care/ADL training;DME and/or AE instruction;Patient/family education   OT Goals(Current goals can be found in the care plan section) Acute Rehab OT Goals Patient Stated Goal: walk OT Goal Formulation: With patient Time For Goal Achievement: 12/12/12 Potential to Achieve Goals: Good ADL Goals Pt Will Perform Lower Body Bathing: with supervision;sit to/from stand;with adaptive equipment Pt Will Perform Lower Body Dressing: with supervision;with adaptive equipment;sit to/from stand (pants only) Pt Will Transfer to Toilet: with min guard assist;ambulating;bedside commode Pt Will Perform Toileting - Clothing Manipulation and hygiene: with supervision;sit to/from stand Pt Will Perform Tub/Shower Transfer: with min assist;ambulating;3 in 1 Additional ADL Goal #1: pt will follow thps without cues during adls  Visit Information  Last OT Received On: 12/05/12 Assistance Needed: +2 History of Present Illness: 79 yo male s/p L THA revision. Post-op hypotension. Hx of AAA, chronic A flutter.        Prior Functioning     Home Living Family/patient expects to be discharged to:: Private residence Living Arrangements: Children Type of Home: House Home Access: Stairs to enter Secretary/administrator of Steps: 1 Home Layout:  (he uses first floor) Home Equipment: Bedside commode Additional Comments: daughter home 24/7 Prior  Function Level of  Independence: Independent Communication Communication: No difficulties         Vision/Perception     Cognition  Cognition Arousal/Alertness: Awake/alert Behavior During Therapy: WFL for tasks assessed/performed Overall Cognitive Status: Within Functional Limits for tasks assessed    Extremity/Trunk Assessment Upper Extremity Assessment Upper Extremity Assessment: Defer to OT evaluation Lower Extremity Assessment Lower Extremity Assessment: LLE deficits/detail LLE Deficits / Details: hip abd 2/5, moves ankle well Cervical / Trunk Assessment Cervical / Trunk Assessment: Normal     Mobility Bed Mobility Bed Mobility: Supine to Sit Supine to Sit: 3: Mod assist Details for Bed Mobility Assistance: assist with LLE and a little help for trunk.  cues for technique Transfers Transfers: Sit to Stand Sit to Stand: 3: Mod assist;From bed;From elevated surface Stand to Sit: 4: Min assist;To chair/3-in-1;With armrests Details for Transfer Assistance: Assist to rise, stabilize, control descent. VCs safety, technique, hand placement. +2 stand pivot for lines, leads, assist. Increased time.      Exercise     Balance     End of Session OT - End of Session Activity Tolerance: Patient tolerated treatment well Patient left: in chair;with call bell/phone within reach Nurse Communication: Mobility status  GO     Austin [name] 12/05/2012, 11:43 AM Marica Otter, OTR/L [phone] 12/05/2012

## 2012-12-05 NOTE — Evaluation (Signed)
Physical Therapy Evaluation Patient Details Name: Austin [name] MRN: [id number] DOB: 1926/08/22 Today's Date: 12/05/2012 Time: 1025-1050 PT Time Calculation (min): 25 min  PT Assessment / Plan / Recommendation History of Present Illness  79 yo male s/p L THA revision. Post-op hypotension. Hx of AAA, chronic A flutter.   Clinical Impression  On eval, pt required +2 assist for safe mobility-able to perform stand pivot, bed>recliner. HR increased to 120-130s with activity. Will plan to progress activity level as able. Recommend HHPT, RW, 24 hour supervision/assist.     PT Assessment  Patient needs continued PT services    Follow Up Recommendations  Home health PT;Supervision/Assistance - 24 hour    Does the patient have the potential to tolerate intense rehabilitation      Barriers to Discharge        Equipment Recommendations  Rolling walker with 5" wheels    Recommendations for Other Services OT consult   Frequency 7X/week    Precautions / Restrictions Precautions Precautions: Posterior Hip;Fall Precaution Comments: Reviewed posterior hip precautions and PWB status Restrictions Weight Bearing Restrictions: Yes LLE Weight Bearing: Partial weight bearing LLE Partial Weight Bearing Percentage or Pounds: 50% Other Position/Activity Restrictions: LLE PWB 50%   Pertinent Vitals/Pain L LE-5/10 with activity      Mobility  Bed Mobility Bed Mobility: Supine to Sit Supine to Sit: 3: Mod assist Details for Bed Mobility Assistance: assist with LLE and a little help for trunk.  cues for technique Transfers Transfers: Sit to Stand;Stand to Sit;Stand Pivot Transfers Sit to Stand: 3: Mod assist;From bed;From elevated surface Stand to Sit: 4: Min assist;To chair/3-in-1;With armrests Stand Pivot Transfers: 1: +2 Total assist Stand Pivot Transfers: Patient Percentage: 70% Details for Transfer Assistance: Assist to rise, stabilize, control descent. VCs safety, technique, hand  placement. +2 stand pivot for lines, leads, assist. Increased time.  Ambulation/Gait Ambulation/Gait Assistance: Not tested (comment) Ambulation/Gait Assistance Details: Deferred due to pt still on pressors-MD okay with bed to chair but requested we hold off on ambulation.     Exercises     PT Diagnosis: Difficulty walking;Abnormality of gait;Generalized weakness;Acute pain  PT Problem List: Decreased strength;Decreased range of motion;Decreased activity tolerance;Decreased mobility;Pain;Decreased knowledge of use of DME;Decreased knowledge of precautions;Obesity;Cardiopulmonary status limiting activity PT Treatment Interventions: DME instruction;Gait training;Stair training;Functional mobility training;Therapeutic activities;Therapeutic exercise;Patient/family education     PT Goals(Current goals can be found in the care plan section) Acute Rehab PT Goals Patient Stated Goal: walk PT Goal Formulation: With patient Time For Goal Achievement: 12/19/12 Potential to Achieve Goals: Good  Visit Information  Last PT Received On: 12/05/12 Assistance Needed: +2 PT/OT Co-Evaluation/Treatment: Yes History of Present Illness: 79 yo male s/p L THA revision. Post-op hypotension. Hx of AAA, chronic A flutter.        Prior Functioning  Home Living Family/patient expects to be discharged to:: Private residence Living Arrangements: Children Type of Home: House Home Access: Stairs to enter Secretary/administrator of Steps: 1 Home Layout:  (he uses first floor) Home Equipment: Bedside commode Additional Comments: daughter home 24/7 Prior Function Level of Independence: Independent Communication Communication: No difficulties    Cognition  Cognition Arousal/Alertness: Awake/alert Behavior During Therapy: WFL for tasks assessed/performed Overall Cognitive Status: Within Functional Limits for tasks assessed    Extremity/Trunk Assessment Upper Extremity Assessment Upper Extremity Assessment:  Defer to OT evaluation Lower Extremity Assessment Lower Extremity Assessment: LLE deficits/detail LLE Deficits / Details: hip abd 2/5, moves ankle well Cervical / Trunk Assessment Cervical / Trunk Assessment:  Normal   Balance    End of Session PT - End of Session Activity Tolerance: Patient tolerated treatment well Patient left: in chair;with call bell/phone within reach  GP     Rebeca Alert, MPT Pager: [phone]

## 2012-12-05 NOTE — Progress Notes (Signed)
Utilization review completed.  

## 2012-12-06 ENCOUNTER — Encounter (HOSPITAL_COMMUNITY): Payer: Self-pay | Admitting: Orthopedic Surgery

## 2012-12-06 DIAGNOSIS — I714 Abdominal aortic aneurysm, without rupture, unspecified: Secondary | ICD-10-CM

## 2012-12-06 LAB — BASIC METABOLIC PANEL
CO2: 28 mEq/L (ref 19–32)
Calcium: 8.4 mg/dL (ref 8.4–10.5)
Creatinine, Ser: 0.86 mg/dL (ref 0.50–1.35)
Glucose, Bld: 158 mg/dL — ABNORMAL HIGH (ref 70–99)
Sodium: 135 mEq/L (ref 135–145)

## 2012-12-06 LAB — CBC
HCT: 27 % — ABNORMAL LOW (ref 39.0–52.0)
Hemoglobin: 8.9 g/dL — ABNORMAL LOW (ref 13.0–17.0)
MCH: 30.9 pg (ref 26.0–34.0)
MCV: 93.8 fL (ref 78.0–100.0)
RBC: 2.88 MIL/uL — ABNORMAL LOW (ref 4.22–5.81)

## 2012-12-06 NOTE — Consult Note (Signed)
Pt. Seen and examined. Agree with the NP/PA-C note as written.  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 Charlann Boxer. Surgery went well with some mild post-operative hypotension that has resolved. Telemetry shows a-fib with CVR. He has been restarted on xarelto. He denies chest pain or dyspnea. He reports very little hip pain.  Looks to be doing well.  We will be available as needed. No further recommendations at this time.  Chrystie Nose, MD, [facility] Attending Cardiologist [facility]

## 2012-12-06 NOTE — Progress Notes (Signed)
Physical Therapy Treatment Patient Details Name: COSTANTINO KOHLBECK MRN: [id number] DOB: 10-08-1926 Today's Date: 12/06/2012 Time: 4034-7425 PT Time Calculation (min): 25 min  PT Assessment / Plan / Recommendation  History of Present Illness 79 yo male s/p L THA revision. Post-op hypotension. Hx of AAA, chronic A flutter.    PT Comments   Progressing with mobility. Limited by fatigue.   Follow Up Recommendations  Home health PT;Supervision/Assistance - 24 hour     Does the patient have the potential to tolerate intense rehabilitation     Barriers to Discharge        Equipment Recommendations  Rolling walker with 5" wheels    Recommendations for Other Services OT consult  Frequency 7X/week   Progress towards PT Goals Progress towards PT goals: Progressing toward goals  Plan Current plan remains appropriate    Precautions / Restrictions Precautions Precautions: Posterior Hip;Fall Precaution Comments: Reviewed posterior hip precautios and PWB status Restrictions Weight Bearing Restrictions: Yes LLE Weight Bearing: Partial weight bearing LLE Partial Weight Bearing Percentage or Pounds: 50%   Pertinent Vitals/Pain L LE with activity 5/10 "discomfort"    Mobility  Bed Mobility Bed Mobility: Supine to Sit;Sit to Supine Supine to Sit: 3: Mod assist;HOB elevated Sit to Supine: 3: Mod assist;HOB flat Details for Bed Mobility Assistance: assist with LLE and a little help for trunk.  cues for technique Transfers Transfers: Sit to Stand;Stand to Sit Sit to Stand: 3: Mod assist;From elevated surface;From bed;From chair/3-in-1 Stand to Sit: 3: Mod assist;To chair/3-in-1;To bed Details for Transfer Assistance: Assist to rise, stabilize, control descent. VCs safety, technique, hand placement.  Ambulation/Gait Ambulation/Gait Assistance: 3: Mod assist Ambulation Distance (Feet): 50 Feet Assistive device: Rolling walker Ambulation/Gait Assistance Details: Assist to stabilize.  Followed with recliner. VCs safety, sequence, technique, step length. Fatigues fairly easily.  Gait Pattern: Step-to pattern;Step-through pattern;Decreased stride length;Antalgic    Exercises     PT Diagnosis:    PT Problem List:   PT Treatment Interventions:     PT Goals (current goals can now be found in the care plan section)    Visit Information  Last PT Received On: 12/06/12 Assistance Needed: +2 safety PT/OT Co-Evaluation/Treatment: Yes History of Present Illness: 79 yo male s/p L THA revision. Post-op hypotension. Hx of AAA, chronic A flutter.     Subjective Data      Cognition  Cognition Arousal/Alertness: Awake/alert Behavior During Therapy: WFL for tasks assessed/performed Overall Cognitive Status: Within Functional Limits for tasks assessed    Balance     End of Session PT - End of Session Activity Tolerance: Patient limited by fatigue Patient left: in bed;with call bell/phone within reach   GP     Rebeca Alert, MPT Pager: [phone]

## 2012-12-06 NOTE — Progress Notes (Signed)
Patient ID: Austin [name], male   DOB: 11/06/1926, 79 y.o.   MRN: [id number] Subjective: 2 Days Post-Op Procedure(s) (LRB): LEFT TOTAL HIP REVISION (Left)    Patient reports pain as moderate.  Sat in chair yesterday otherwise not a lot of other activity  Objective:   VITALS:   Filed Vitals:   12/06/12 0730  BP: 115/55  Pulse: 64  Temp:   Resp: 16    Neurovascular intact Incision: dressing C/D/I  LABS  Recent Labs  12/04/12 1842 12/05/12 0335 12/06/12 0331  HGB 12.1* 11.0* 8.9*  HCT 36.5* 32.9* 27.0*  WBC 15.4* 12.9* 15.4*  PLT 160 153 144*     Recent Labs  12/04/12 1842 12/05/12 0335 12/06/12 0331  NA 135 132* 135  K 5.0 4.6 4.6  BUN 15 14 14   CREATININE 0.83 0.76 0.86  GLUCOSE 161* 194* 158*     Recent Labs  12/04/12 0900 12/04/12 1842  INR 0.99 1.13     Assessment/Plan: 2 Days Post-Op Procedure(s) (LRB): LEFT TOTAL HIP REVISION (Left)   Advance diet Up with therapy  Still on Neo gtt for blood pressure stability, weaning per CCM Will plan to contact [facility] to have them on board with peri-operative management  PWB LLE when up with therapist D/C plan pending medical stability and therapy assessment and recommendations

## 2012-12-06 NOTE — Progress Notes (Signed)
Occupational Therapy Treatment Patient Details Name: Austin [name] MRN: [id number] DOB: 1926/05/03 Today's Date: 12/06/2012 Time: 4098-1191 OT Time Calculation (min): 24 min  OT Assessment / Plan / Recommendation  History of present illness 79 yo male s/p L THA revision. Post-op hypotension. Hx of AAA, chronic A flutter.    OT comments  Pt continues to make good improvements with mobility and adls.  Needs reinforcement with THPs  Follow Up Recommendations  Home health OT    Barriers to Discharge       Equipment Recommendations  None recommended by OT    Recommendations for Other Services    Frequency Min 2X/week   Progress towards OT Goals Progress towards OT goals: Progressing toward goals  Plan Discharge plan remains appropriate    Precautions / Restrictions Precautions Precautions: Posterior Hip;Fall Precaution Comments: pt recalled 2/3 thps and PWB.  Reviewed Restrictions Weight Bearing Restrictions: Yes LLE Weight Bearing: Partial weight bearing LLE Partial Weight Bearing Percentage or Pounds: 50%   Pertinent Vitals/Pain L thigh pain, moderate (5/5) Repositioned with ice    ADL  Lower Body Dressing: Simulated;Moderate assistance (pillowcase as pants with reacher) Where Assessed - Lower Body Dressing: Supported sitting Toilet Transfer: Simulated;Moderate assistance Toilet Transfer Method: Stand pivot Transfers/Ambulation Related to ADLs: pt does well ambulating once up.  Cues for stepping up to L with RLE (not past) and UE/LE placement with sit to stand ADL Comments: Practiced with reacher.  Pt will let daughter assist with socks, but he wants to be indendent with pants/underwear    OT Diagnosis:    OT Problem List:   OT Treatment Interventions:     OT Goals(current goals can now be found in the care plan section)    Visit Information  Last OT Received On: 12/06/12 Assistance Needed: +2 History of Present Illness: 79 yo male s/p L THA revision. Post-op  hypotension. Hx of AAA, chronic A flutter.     Subjective Data      Prior Functioning       Cognition  Cognition Arousal/Alertness: Awake/alert Behavior During Therapy: WFL for tasks assessed/performed Overall Cognitive Status: Within Functional Limits for tasks assessed    Mobility  Bed Mobility Bed Mobility: Supine to Sit;Sit to Supine Supine to Sit: 3: Mod assist;HOB elevated Sit to Supine: 3: Mod assist;HOB flat Details for Bed Mobility Assistance: assist with LLE and a little help for trunk.  cues for technique Transfers Sit to Stand: 3: Mod assist;From elevated surface;From bed;From chair/3-in-1 Stand to Sit: 3: Mod assist;To chair/3-in-1;To bed Details for Transfer Assistance: Assist to rise, stabilize, control descent. VCs safety, technique, hand placement.     Exercises      Balance     End of Session OT - End of Session Activity Tolerance: Patient limited by fatigue Patient left: in bed;with call bell/phone within reach  GO     [facility] 12/06/2012, 4:08 PM Marica Otter, OTR/L [phone] 12/06/2012

## 2012-12-06 NOTE — Consult Note (Signed)
Reason for Consult: Assistance with Peri-Operative Managment Referring Physician: Dr. Charlann Boxer [facility])   HPI: The patient is a 79 y/o male, followed at [facility] by Dr. Allyson Sabal, who is day 2 s/p left total hip revision. We have been consulted to assist with post-operative management, given his cardiac issues. Prior to undergoing surgery, he was cleared by Dr. Allyson Sabal to undergo the procedure after undergoing a low risk nuclear stress study on 11/15/12. His past cardiovascular history is as follows: The patient underwent abdominal aortic aneurysm surgical repair May 21, 2010, and tolerated this well. Other medical problems include chronic atrial flutter which is asymptomatic. He is on Xarelto. He does have coronary disease. He had a catheterization in 2009 in [place] that showed a 50% to 60% RCA but no other significant coronary disease. He has no symptoms of angina. He has treated hypertension and treated dyslipidemia. He is doing well post-operatively. He is in atrial fibrillation on telemetry, with a controlled ventricular response. His HR is in the 70s. He did have hypotension post-operatively and required pressor support. However, that has been recently discontinued as his BP has improved. He denies any symptoms. No chest pain, SOB or palpitations.   Past Medical History  Diagnosis Date  . Hypertension   . Hyperlipidemia   . AAA (abdominal aortic aneurysm)   . Arthritis   . CAD (coronary artery disease)   . Thyroid disease     hypothyroidism  . Dysrhythmia     atrial flutter  . Shortness of breath     a little with exertion  . Polio 1934    Past Surgical History  Procedure Laterality Date  . Abdominal aortic aneurysm repair  05/21/2010    aorto bi-iliac BPG  . Appendectomy    . Popliteal synovial cyst excision Left   . Joint replacement      Knee and Hip replacements  . Total hip arthroplasty Left 1999  . Knee arthroplasty Left   . Total hip revision Left 12/04/2012   Procedure: LEFT TOTAL HIP REVISION;  Surgeon: Shelda Pal, MD;  Location: [facility];  Service: Orthopedics;  Laterality: Left;    Family History  Problem Relation Age of Onset  . Other Father     varicose veins  . Hyperlipidemia Son   . Hypertension Son   . AAA (abdominal aortic aneurysm) Son     Social History:  reports that he quit smoking about 46 years ago. His smoking use included Cigarettes. He has a 25 pack-year smoking history. He has never used smokeless tobacco. He reports that  drinks alcohol. He reports that he does not use illicit drugs.  Allergies: No Known Allergies  Medications: Prior to Admission medications   Medication Sig Start Date End Date Taking? Authorizing Provider  aspirin 81 MG tablet Take 81 mg by mouth daily.   Yes Historical Provider, MD  levothyroxine (SYNTHROID, LEVOTHROID) 75 MCG tablet Take 75 mcg by mouth daily before breakfast.   Yes Historical Provider, MD  losartan (COZAAR) 50 MG tablet Take 50 mg by mouth every morning.    Yes Historical Provider, MD  lovastatin (MEVACOR) 40 MG tablet Take 40 mg by mouth every morning.    Yes Historical Provider, MD  metoprolol succinate (TOPROL-XL) 25 MG 24 hr tablet Take 25 mg by mouth every morning.   Yes Historical Provider, MD  Multiple Vitamin (MULTIVITAMIN WITH MINERALS) TABS tablet Take 1 tablet by mouth daily.   Yes Historical Provider, MD  cholecalciferol (VITAMIN D) 1000  UNITS tablet Take 1,000 Units by mouth daily.    Historical Provider, MD  ferrous sulfate (IRON SUPPLEMENT) 325 (65 FE) MG tablet Take 325 mg by mouth daily with breakfast.    Historical Provider, MD  Rivaroxaban (XARELTO) 20 MG TABS Take 20 mg by mouth daily.    Historical Provider, MD     Results for orders placed during the hospital encounter of 12/04/12 (from the past 48 hour(s))  PREPARE RBC (CROSSMATCH)     Status: None   Collection Time    12/04/12  3:00 PM      Result Value Range   Order Confirmation ORDER PROCESSED BY  BLOOD BANK    PREPARE RBC (CROSSMATCH)     Status: None   Collection Time    12/04/12  4:00 PM      Result Value Range   Order Confirmation ORDER PROCESSED BY BLOOD BANK    HEMOGLOBIN AND HEMATOCRIT, BLOOD     Status: Abnormal   Collection Time    12/04/12  4:42 PM      Result Value Range   Hemoglobin 11.5 (*) 13.0 - 17.0 g/dL   HCT 65.7 (*) 84.6 - 96.2 %  TROPONIN I     Status: None   Collection Time    12/04/12  6:40 PM      Result Value Range   Troponin I <0.30  <0.30 ng/mL   Comment:            Due to the release kinetics of cTnI,     a negative result within the first hours     of the onset of symptoms does not rule out     myocardial infarction with certainty.     If myocardial infarction is still suspected,     repeat the test at appropriate intervals.  BASIC METABOLIC PANEL     Status: Abnormal   Collection Time    12/04/12  6:42 PM      Result Value Range   Sodium 135  135 - 145 mEq/L   Potassium 5.0  3.5 - 5.1 mEq/L   Chloride 103  96 - 112 mEq/L   CO2 23  19 - 32 mEq/L   Glucose, Bld 161 (*) 70 - 99 mg/dL   BUN 15  6 - 23 mg/dL   Creatinine, Ser 9.52  0.50 - 1.35 mg/dL   Calcium 8.0 (*) 8.4 - 10.5 mg/dL   GFR calc non Af Amer 79 (*) >90 mL/min   GFR calc Af Amer >90  >90 mL/min   Comment: (NOTE)     The eGFR has been calculated using the CKD EPI equation.     This calculation has not been validated in all clinical situations.     eGFR's persistently <90 mL/min signify possible Chronic Kidney     Disease.  CBC     Status: Abnormal   Collection Time    12/04/12  6:42 PM      Result Value Range   WBC 15.4 (*) 4.0 - 10.5 K/uL   RBC 3.84 (*) 4.22 - 5.81 MIL/uL   Hemoglobin 12.1 (*) 13.0 - 17.0 g/dL   HCT 84.1 (*) 32.4 - 40.1 %   MCV 95.1  78.0 - 100.0 fL   MCH 31.5  26.0 - 34.0 pg   MCHC 33.2  30.0 - 36.0 g/dL   RDW 02.7  25.3 - 66.4 %   Platelets 160  150 - 400 K/uL  PROTIME-INR  Status: None   Collection Time    12/04/12  6:42 PM      Result Value  Range   Prothrombin Time 14.3  11.6 - 15.2 seconds   INR 1.13  0.00 - 1.49  CBC     Status: Abnormal   Collection Time    12/05/12  3:35 AM      Result Value Range   WBC 12.9 (*) 4.0 - 10.5 K/uL   RBC 3.50 (*) 4.22 - 5.81 MIL/uL   Hemoglobin 11.0 (*) 13.0 - 17.0 g/dL   HCT 29.5 (*) 62.1 - 30.8 %   MCV 94.0  78.0 - 100.0 fL   MCH 31.4  26.0 - 34.0 pg   MCHC 33.4  30.0 - 36.0 g/dL   RDW 65.7  84.6 - 96.2 %   Platelets 153  150 - 400 K/uL  BASIC METABOLIC PANEL     Status: Abnormal   Collection Time    12/05/12  3:35 AM      Result Value Range   Sodium 132 (*) 135 - 145 mEq/L   Potassium 4.6  3.5 - 5.1 mEq/L   Chloride 100  96 - 112 mEq/L   CO2 20  19 - 32 mEq/L   Glucose, Bld 194 (*) 70 - 99 mg/dL   BUN 14  6 - 23 mg/dL   Creatinine, Ser 9.52  0.50 - 1.35 mg/dL   Calcium 7.9 (*) 8.4 - 10.5 mg/dL   GFR calc non Af Amer 81 (*) >90 mL/min   GFR calc Af Amer >90  >90 mL/min   Comment: (NOTE)     The eGFR has been calculated using the CKD EPI equation.     This calculation has not been validated in all clinical situations.     eGFR's persistently <90 mL/min signify possible Chronic Kidney     Disease.  CBC     Status: Abnormal   Collection Time    12/06/12  3:31 AM      Result Value Range   WBC 15.4 (*) 4.0 - 10.5 K/uL   RBC 2.88 (*) 4.22 - 5.81 MIL/uL   Hemoglobin 8.9 (*) 13.0 - 17.0 g/dL   Comment: REPEATED TO VERIFY     DELTA CHECK NOTED   HCT 27.0 (*) 39.0 - 52.0 %   MCV 93.8  78.0 - 100.0 fL   MCH 30.9  26.0 - 34.0 pg   MCHC 33.0  30.0 - 36.0 g/dL   RDW 84.1  32.4 - 40.1 %   Platelets 144 (*) 150 - 400 K/uL  BASIC METABOLIC PANEL     Status: Abnormal   Collection Time    12/06/12  3:31 AM      Result Value Range   Sodium 135  135 - 145 mEq/L   Potassium 4.6  3.5 - 5.1 mEq/L   Chloride 103  96 - 112 mEq/L   CO2 28  19 - 32 mEq/L   Glucose, Bld 158 (*) 70 - 99 mg/dL   BUN 14  6 - 23 mg/dL   Creatinine, Ser 0.27  0.50 - 1.35 mg/dL   Calcium 8.4  8.4 - 25.3  mg/dL   GFR calc non Af Amer 77 (*) >90 mL/min   GFR calc Af Amer 90 (*) >90 mL/min   Comment: (NOTE)     The eGFR has been calculated using the CKD EPI equation.     This calculation has not been validated in all clinical situations.  eGFR's persistently <90 mL/min signify possible Chronic Kidney     Disease.    Dg Femur Left  12/04/2012   *RADIOLOGY REPORT*  Clinical Data: Postop left hip arthroplasty  LEFT FEMUR - 2 VIEW  Comparison: None.  Findings: A left hip arthroplasty is well seated and aligned. There is no acute fracture.  There is no evidence of an operative complication.  IMPRESSION: Postoperative left hip images as detailed.  No evidence of an operative complication.   Original Report Authenticated By: Amie Portland, M.D.   Dg Pelvis Portable  12/04/2012   *RADIOLOGY REPORT*  Clinical Data: Postop left hip images  PORTABLE PELVIS  Comparison: Abdomen radiograph, 05/27/2010  Findings: The left hip arthroplasty has been revised.  The femoral and acetabular components are well seated and aligned.  There is no acute fracture.  There is no evidence of an operative complication.  Advanced arthropathic changes of the right hip are stable from prior study.  IMPRESSION: Postoperative views of the left hip show of the left hip prosthesis to be well seated and aligned.  No operative complication.   Original Report Authenticated By: Amie Portland, M.D.    Review of Systems  Respiratory: Negative for shortness of breath.   Cardiovascular: Negative for chest pain and palpitations.  All other systems reviewed and are negative.   Blood pressure 86/44, pulse 67, temperature 97.7 F (36.5 C), temperature source Oral, resp. rate 14, weight 308 lb 6.8 oz (139.9 kg), SpO2 95.00%. Physical Exam  Constitutional: He is oriented to person, place, and time. He appears well-developed and well-nourished. No distress.  HENT:  Head: Normocephalic and atraumatic.  Neck: No JVD present. Carotid bruit is  not present.  Cardiovascular: Normal rate, normal heart sounds and intact distal pulses.  An irregularly irregular rhythm present. Exam reveals no gallop and no friction rub.   No murmur heard. Pulses:      Radial pulses are 2+ on the right side, and 2+ on the left side.       Dorsalis pedis pulses are 2+ on the right side, and 2+ on the left side.  Respiratory: Effort normal and breath sounds normal. No respiratory distress. He has no wheezes. He has no rales.  GI: Soft. He exhibits no distension and no mass. There is no tenderness.  Musculoskeletal: He exhibits no edema.  Neurological: He is alert and oriented to person, place, and time.  Skin: Skin is warm and dry. He is not diaphoretic.  Psychiatric: He has a normal mood and affect. His behavior is normal.    Assessment/Plan: Principal Problem:   S/P left TH revision Active Problems:   Postoperative hypovolemic shock   Chronic atrial flutter  Plan: day 2 s/p left total hip revision. He is stable from a cardiovasulcar point. He is in a-fib on telemetry with a controlled ventricular response. Hypotension has resolved. Pressor support has been discontinued. NST was low risk prior to surgery. Now that BP has improved, will need to resume BB for a-fib. Continue on telemetry. Will continue to follow.  SIMMONS, BRITTAINY 12/06/2012, 2:50 PM

## 2012-12-06 NOTE — Progress Notes (Addendum)
PULMONARY  / CRITICAL CARE MEDICINE  Name: Austin [name] MRN: [id number] DOB: Nov 02, 1926    ADMISSION DATE:  12/04/2012 CONSULTATION DATE:  12/06/2012   REFERRING MD :  Charlann Boxer PRIMARY SERVICE: Ortho  CHIEF COMPLAINT:  Hypotension, a flutter post op  BRIEF PATIENT DESCRIPTION: 86/M with chronic atrial flutter s/p Revision of left total hip arthroplasty with post op hypotension, EBL 1.5 L   SIGNIFICANT EVENTS / STUDIES:  8/18 - admit for revision of L hip arthoplasty, aflutter, hypotension 8/19 - remains on low dose neo 8/20 - off neo, rate better controlled  LINES / TUBES:   CULTURES:   ANTIBIOTICS: Cefazolin 8/18 >>   SUBJECTIVE: Pt denies acute complaints.  RN reports off neo since early am 8/20  VITAL SIGNS: Temp:  [97.5 F (36.4 C)-98.6 F (37 C)] 97.5 F (36.4 C) (08/20 0800) Pulse Rate:  [64-130] 130 (08/20 1000) Resp:  [15-22] 21 (08/20 1000) BP: (84-123)/(41-70) 109/59 mmHg (08/20 1000) SpO2:  [88 %-96 %] 93 % (08/20 1000) Weight:  [308 lb 6.8 oz (139.9 kg)] 308 lb 6.8 oz (139.9 kg) (08/20 0000)  INTAKE / OUTPUT: Intake/Output     08/19 0701 - 08/20 0700 08/20 0701 - 08/21 0700   P.O. 360 240   I.V. (mL/kg) 4059.6 (29) 271.3 (1.9)   Blood     Other     IV Piggyback     Total Intake(mL/kg) 4419.6 (31.6) 511.3 (3.7)   Urine (mL/kg/hr) 3477 (1) 500 (0.8)   Drains     Blood     Total Output 3477 500   Net +942.6 +11.3          PHYSICAL EXAMINATION: Gen. Pleasant, well-nourished, in no distress, normal affect, appears younger than stated age ENT - no lesions, no post nasal drip Neck: No JVD, no thyromegaly, no carotid bruits Lungs: no use of accessory muscles, no dullness to percussion, clear without rales or rhonchi  Cardiovascular: Rhythm irregular, heart sounds  normal, no murmurs, no peripheral edema Abdomen: soft and non-tender, no hepatosplenomegaly, BS normal. Musculoskeletal: No deformities, no cyanosis or clubbing Neuro:  alert, non  focal Skin:  Warm, no lesions/ rash   LABS:  CBC Recent Labs     12/04/12  1842  12/05/12  0335  12/06/12  0331  WBC  15.4*  12.9*  15.4*  HGB  12.1*  11.0*  8.9*  HCT  36.5*  32.9*  27.0*  PLT  160  153  144*   Coag's Recent Labs     12/04/12  0900  12/04/12  1842  APTT  34   --   INR  0.99  1.13   BMET Recent Labs     12/04/12  1842  12/05/12  0335  12/06/12  0331  NA  135  132*  135  K  5.0  4.6  4.6  CL  103  100  103  CO2  23  20  28   BUN  15  14  14   CREATININE  0.83  0.76  0.86  GLUCOSE  161*  194*  158*   Electrolytes Recent Labs     12/04/12  1842  12/05/12  0335  12/06/12  0331  CALCIUM  8.0*  7.9*  8.4   Cardiac Enzymes Recent Labs     12/04/12  1840  TROPONINI  <0.30   Glucose No results found for this basename: GLUCAP,  in the last 72 hours  Imaging Dg Femur Left  12/04/2012   *  RADIOLOGY REPORT*  Clinical Data: Postop left hip arthroplasty  LEFT FEMUR - 2 VIEW  Comparison: None.  Findings: A left hip arthroplasty is well seated and aligned. There is no acute fracture.  There is no evidence of an operative complication.  IMPRESSION: Postoperative left hip images as detailed.  No evidence of an operative complication.   Original Report Authenticated By: Amie Portland, M.D.   Dg Pelvis Portable  12/04/2012   *RADIOLOGY REPORT*  Clinical Data: Postop left hip images  PORTABLE PELVIS  Comparison: Abdomen radiograph, 05/27/2010  Findings: The left hip arthroplasty has been revised.  The femoral and acetabular components are well seated and aligned.  There is no acute fracture.  There is no evidence of an operative complication.  Advanced arthropathic changes of the right hip are stable from prior study.  IMPRESSION: Postoperative views of the left hip show of the left hip prosthesis to be well seated and aligned.  No operative complication.   Original Report Authenticated By: Amie Portland, M.D.     ASSESSMENT / PLAN:  PULMONARY A: At risk  ATX Former Smoker ? Seasonal Allergies  P:   -IS / pulmonary hygiene, pulls 2500 on IS -claritin   CARDIOVASCULAR A: Hypotension/ hypovolemia - troponin negative Atrial flutter, chronic on rivaroxaban Allyson Sabal) P:  Pressors weaned off 8/20  lopressor prn for rate control Cardiology consulted per PMD for rate control follow up   RENAL A:  At risk for AKI P:   Hold losartan Follow BMP  GASTROINTESTINAL A:  Constipation P:   Advance diet as tolerated Bowel regimen per Ortho  HEMATOLOGIC A: Acute blood loss anemia s/p 2 U PRBC.  Resolved.  P:  -follow cbc -minimize phlebotomy as able -if further loss, consider FOB testing, has had positive in past   ENDOCRINE A:  hypothyroid   P:   Continue synthroid  NEUROLOGIC A:  Acute pain - post op hip revision  P:   Use fentnayl prn with transition to oral regimen as needed   PCCM will sign off.  Ok for transfer to medical floor.  Please call if we can be of further assistance.  Thank you for the consultation.    Canary Brim, NP-C [facility] Pgr: [phone] or [phone]    I have personally obtained a history, examined the patient, evaluated laboratory and imaging results, formulated the assessment and plan and placed orders.  Levy Pupa, MD, PhD 12/06/2012, 5:08 PM [facility] [phone] or if no answer [phone]

## 2012-12-07 LAB — CBC
MCH: 31.9 pg (ref 26.0–34.0)
MCV: 94.6 fL (ref 78.0–100.0)
Platelets: 118 10*3/uL — ABNORMAL LOW (ref 150–400)
RBC: 2.79 MIL/uL — ABNORMAL LOW (ref 4.22–5.81)

## 2012-12-07 NOTE — Progress Notes (Signed)
Physical Therapy Treatment Patient Details Name: Austin [name] MRN: [id number] DOB: Feb 25, 1927 Today's Date: 12/07/2012 Time: 1478-2956 PT Time Calculation (min): 8 min  PT Assessment / Plan / Recommendation  History of Present Illness 79 yo male s/p L THA revision. Post-op hypotension. Hx of AAA, chronic A flutter.    PT Comments   Progressing with mobility. Plan is for d/c home on tomorrow. Needs to practice 1 step and review car transfer.   Follow Up Recommendations  Home health PT;Supervision/Assistance - 24 hour     Does the patient have the potential to tolerate intense rehabilitation     Barriers to Discharge        Equipment Recommendations  Rolling walker with 5" wheels    Recommendations for Other Services OT consult  Frequency 7X/week   Progress towards PT Goals Progress towards PT goals: Progressing toward goals  Plan Current plan remains appropriate    Precautions / Restrictions Precautions Precautions: Posterior Hip;Fall Precaution Comments: Educated pt again on importance of adhereing to hip precautions and sitting on surfaces of appropriate height Restrictions Weight Bearing Restrictions: Yes LLE Weight Bearing: Partial weight bearing LLE Partial Weight Bearing Percentage or Pounds: 50%   Pertinent Vitals/Pain L LE-"discomfort"-unrated    Mobility  Bed Mobility    Exercises Total Joint Exercises Ankle Circles/Pumps: AROM;Both;15 reps;Supine Quad Sets: AROM;Both;15 reps;Supine Heel Slides: AAROM;Left;10 reps;Supine Hip ABduction/ADduction: AAROM;Left;10 reps;Supine   PT Diagnosis:    PT Problem List:   PT Treatment Interventions:     PT Goals (current goals can now be found in the care plan section)    Visit Information  Last PT Received On: 12/07/12 Assistance Needed: +2 History of Present Illness: 79 yo male s/p L THA revision. Post-op hypotension. Hx of AAA, chronic A flutter.     Subjective Data      Cognition   Cognition Arousal/Alertness: Awake/alert Behavior During Therapy: WFL for tasks assessed/performed Overall Cognitive Status: Within Functional Limits for tasks assessed    Balance     End of Session PT - End of Session Activity Tolerance: Patient tolerated treatment well Patient left: in bed;with call bell/phone within reach Nurse Communication: Mobility status;Weight bearing status;Precautions (on white board in room)   GP     Rebeca Alert, MPT Pager: [phone]

## 2012-12-07 NOTE — Progress Notes (Signed)
Occupational Therapy Treatment Patient Details Name: Austin [name] MRN: [id number] DOB: 1926/10/17 Today's Date: 12/07/2012 Time: 9629-5284 OT Time Calculation (min): 30 min  OT Assessment / Plan / Recommendation  History of present illness 79 yo male s/p L THA revision. Post-op hypotension. Hx of AAA, chronic A flutter.    OT comments    Follow Up Recommendations  Home health OT    Barriers to Discharge       Equipment Recommendations   (possibly wide 3:1--will ask family to measure their high toi)    Recommendations for Other Services    Frequency Min 2X/week   Progress towards OT Goals Progress towards OT goals: Progressing toward goals  Plan      Precautions / Restrictions Precautions Precautions: Posterior Hip;Fall Precaution Comments: recalled 2/3 initially and got 3rd (adduction) with cue Restrictions Weight Bearing Restrictions: Yes LLE Weight Bearing: Partial weight bearing LLE Partial Weight Bearing Percentage or Pounds: 50%   Pertinent Vitals/Pain 5/10 left hip. Repositioned with ice    ADL  Grooming: Wash/dry hands;Wash/dry face;Teeth care;Supervision/safety Where Assessed - Grooming: Supported standing Toilet Transfer: Simulated;Moderate assistance (for sit to stand; min A steps with cues) Toilet Transfer Method: Sit to stand Transfers/Ambulation Related to ADLs: pt ambulated in hall with min A, cues for step length ADL Comments: Assisted with hygiene and requested high, wide commode from portables    OT Diagnosis:    OT Problem List:   OT Treatment Interventions:     OT Goals(current goals can now be found in the care plan section)    Visit Information  Last OT Received On: 12/07/12 Assistance Needed: +2 PT/OT Co-Evaluation/Treatment: Yes History of Present Illness: 79 yo male s/p L THA revision. Post-op hypotension. Hx of AAA, chronic A flutter.     Subjective Data      Prior Functioning       Cognition   Cognition Arousal/Alertness: Awake/alert Behavior During Therapy: WFL for tasks assessed/performed Overall Cognitive Status: Within Functional Limits for tasks assessed    Mobility  Bed Mobility Bed Mobility: Supine to Sit Supine to Sit: 3: Mod assist;HOB elevated;With rails Details for Bed Mobility Assistance: Assist for L LE off bed and trunk to full upright. Increased time. VCS safety, technique, hand placement. Utilized bedpad to assist with scooting, positioning. Transfers Sit to Stand: 3: Mod assist;From elevated surface;From bed Stand to Sit: 4: Min assist Details for Transfer Assistance: Assist to rise, stabilize, control descent, L LE placement. VCS safety, technique, hand placement    Exercises      Balance     End of Session OT - End of Session Activity Tolerance: Patient tolerated treatment better but still limited by fatique Patient left: in chair;with call bell/phone within reach;with family/visitor present  GO     Austin [name] 12/07/2012, 10:15 AM Austin [name], OTR/L [phone] 12/07/2012

## 2012-12-07 NOTE — Progress Notes (Signed)
Changed dressing to discontinued JP site at 13:50.

## 2012-12-07 NOTE — Progress Notes (Addendum)
Physical Therapy Treatment Patient Details Name: Austin [name] MRN: [id number] DOB: [birth year] Today's Date: 12/07/2012 Time: 4098-1191 PT Time Calculation (min): 19 min  PT Assessment / Plan / Recommendation  History of Present Illness 79 yo male s/p L THA revision. Post-op hypotension. Hx of AAA, chronic A flutter.    PT Comments   Progressing with mobility. Pt reported he walked to bathroom with nursing and sat down on commode (without raised seat/BSC over toilet). Cautioned pt against this due to risk of dislocating hip due to low surface. Also indicated this on pt's white board along with rest of precautions.   Follow Up Recommendations  Home health PT;Supervision/Assistance - 24 hour     Does the patient have the potential to tolerate intense rehabilitation     Barriers to Discharge        Equipment Recommendations  Rolling walker with 5" wheels    Recommendations for Other Services OT consult  Frequency 7X/week   Progress towards PT Goals Progress towards PT goals: Progressing toward goals  Plan Current plan remains appropriate    Precautions / Restrictions Precautions Precautions: Posterior Hip;Fall Precaution Comments: Educated pt again on importance of adhereing to hip precautions and sitting on surfaces of appropriate height Restrictions Weight Bearing Restrictions: Yes LLE Weight Bearing: Partial weight bearing LLE Partial Weight Bearing Percentage or Pounds: 50%   Pertinent Vitals/Pain "Discomfort"- 3/10. Pt denies pain though.    Mobility  Bed Mobility Bed Mobility: Sit to Supine Sit to Supine: 3: Mod assist Details for Bed Mobility Assistance: Assist for L LE onto bed.  Transfers Transfers: Sit to Stand;Stand to Sit Sit to Stand: 3: Mod assist;From chair/3-in-1;With upper extremity assist;With armrests Stand to Sit: 4: Min assist;To elevated surface;To bed Details for Transfer Assistance: Assist to rise, stabilize, control descent, L LE placement.  VCS safety, technique, handplacement.  Ambulation/Gait Ambulation/Gait Assistance: 4: Min assist Ambulation Distance (Feet): 52 Feet Assistive device: Rolling walker Ambulation/Gait Assistance Details: Assist to stabilize. 1 short standing rest break. VCs safety, sequence, technique, adherence to WB status, step length R. FAtigues fairly easily.  Gait Pattern: Step-to pattern;Decreased stride length;Decreased step length - left;Antalgic    Exercises     PT Diagnosis:    PT Problem List:   PT Treatment Interventions:     PT Goals (current goals can now be found in the care plan section)    Visit Information  Last PT Received On: 12/07/12 Assistance Needed: +2 (safety) History of Present Illness: 79 yo male s/p L THA revision. Post-op hypotension. Hx of AAA, chronic A flutter.     Subjective Data      Cognition  Cognition Arousal/Alertness: Awake/alert Behavior During Therapy: WFL for tasks assessed/performed Overall Cognitive Status: Within Functional Limits for tasks assessed    Balance     End of Session PT - End of Session Activity Tolerance: Patient limited by fatigue Patient left: in bed;with call bell/phone within reach Nurse Communication: Mobility status;Weight bearing status;Precautions (written on white board)   GP     Rebeca Alert, MPT Pager: [phone]

## 2012-12-07 NOTE — Progress Notes (Signed)
Physical Therapy Treatment Patient Details Name: Austin [name] MRN: [id number] DOB: [birth year] Today's Date: 12/07/2012 Time: 4098-1191 PT Time Calculation (min): 33 min  PT Assessment / Plan / Recommendation  History of Present Illness 79 yo male s/p L THA revision. Post-op hypotension. Hx of AAA, chronic A flutter.    PT Comments   Progressing slowly with mobility. Limited by fatigue, some pain.   Follow Up Recommendations  Home health PT;Supervision/Assistance - 24 hour     Does the patient have the potential to tolerate intense rehabilitation     Barriers to Discharge        Equipment Recommendations  Rolling walker with 5" wheels    Recommendations for Other Services OT consult  Frequency 7X/week   Progress towards PT Goals Progress towards PT goals: Progressing toward goals  Plan Current plan remains appropriate    Precautions / Restrictions Precautions Precautions: Posterior Hip;Fall Precaution Comments: pt recalled 2/3 thps and PWB.  Reviewed hip precautions and WB status Restrictions Weight Bearing Restrictions: Yes LLE Weight Bearing: Partial weight bearing LLE Partial Weight Bearing Percentage or Pounds: 50%   Pertinent Vitals/Pain L LE with ambulation 5/10. Ice applied end of session.     Mobility  Bed Mobility Bed Mobility: Supine to Sit Supine to Sit: 3: Mod assist;HOB elevated;With rails Details for Bed Mobility Assistance: Assist for L LE off bed and trunk to full upright. Increased time. VCS safety, technique, hand placement. Utilized bedpad to assist with scooting, positioning. Transfers Transfers: Sit to Stand;Stand to Sit Sit to Stand: 3: Mod assist;From elevated surface;From bed Stand to Sit: 3: Mod assist;To chair/3-in-1;With upper extremity assist;With armrests Details for Transfer Assistance: Assist to rise, stabilize, control descent, L LE placement. VCS safety, technique, hand placement Ambulation/Gait Ambulation/Gait Assistance: 4: Min  assist Ambulation Distance (Feet): 50 Feet Assistive device: Rolling walker Ambulation/Gait Assistance Details: Assist to stabilize throughout ambulation. VCs safety, sequence, technique, adherence to WB status, step length R. Fatigues fairly easily. Followed with recliner.  Gait Pattern: Step-to pattern;Decreased stride length;Antalgic;Decreased step length - left    Exercises     PT Diagnosis:    PT Problem List:   PT Treatment Interventions:     PT Goals (current goals can now be found in the care plan section)    Visit Information  Last PT Received On: 12/07/12 Assistance Needed: +2 (safety) PT/OT Co-Evaluation/Treatment: Yes History of Present Illness: 79 yo male s/p L THA revision. Post-op hypotension. Hx of AAA, chronic A flutter.     Subjective Data      Cognition  Cognition Arousal/Alertness: Awake/alert Behavior During Therapy: WFL for tasks assessed/performed Overall Cognitive Status: Within Functional Limits for tasks assessed    Balance     End of Session PT - End of Session Activity Tolerance: Patient limited by fatigue Patient left: in chair;with call bell/phone within reach;with family/visitor present Nurse Communication: Mobility status;Precautions;Weight bearing status (written on white board)   GP     Rebeca Alert, MPT Pager: [phone]

## 2012-12-08 ENCOUNTER — Inpatient Hospital Stay (HOSPITAL_COMMUNITY): Payer: Medicare Other

## 2012-12-08 DIAGNOSIS — D62 Acute posthemorrhagic anemia: Secondary | ICD-10-CM | POA: Diagnosis not present

## 2012-12-08 DIAGNOSIS — E669 Obesity, unspecified: Secondary | ICD-10-CM | POA: Diagnosis present

## 2012-12-08 MED ORDER — DSS 100 MG PO CAPS: 100.0000 mg | ORAL_CAPSULE | Freq: Two times a day (BID) | ORAL | Status: DC

## 2012-12-08 MED ORDER — CEPHALEXIN 500 MG PO CAPS
500.0000 mg | ORAL_CAPSULE | Freq: Three times a day (TID) | ORAL | Status: DC
  Administered 2012-12-08 – 2012-12-11 (×10): 500 mg via ORAL
  Filled 2012-12-08 (×12): qty 1

## 2012-12-08 MED ORDER — HYDROCODONE-ACETAMINOPHEN 7.5-325 MG PO TABS: 1.0000 | ORAL_TABLET | ORAL | Status: DC | PRN

## 2012-12-08 MED ORDER — POLYETHYLENE GLYCOL 3350 17 G PO PACK: 17.0000 g | PACK | Freq: Two times a day (BID) | ORAL | Status: DC

## 2012-12-08 MED ORDER — FUROSEMIDE 20 MG PO TABS
20.0000 mg | ORAL_TABLET | Freq: Every day | ORAL | Status: AC
  Administered 2012-12-08: 20 mg via ORAL
  Filled 2012-12-08: qty 1

## 2012-12-08 MED ORDER — CEPHALEXIN 500 MG PO CAPS: 500.0000 mg | ORAL_CAPSULE | Freq: Three times a day (TID) | ORAL | Status: DC

## 2012-12-08 MED ORDER — FERROUS SULFATE 325 (65 FE) MG PO TABS: 325.0000 mg | ORAL_TABLET | Freq: Three times a day (TID) | ORAL | Status: DC

## 2012-12-08 NOTE — Progress Notes (Signed)
Observed increased edema in bilateral hands and forearms.  Right greater than left.  MD notified.  Night nurse informed.

## 2012-12-08 NOTE — Progress Notes (Addendum)
   Subjective: 4 Days Post-Op Procedure(s) (LRB): LEFT TOTAL HIP REVISION (Left)   Patient reports pain as mild, pain well controlled. Drainage from the HV drain site, but otherwise good. No events throughout the night.  Difficulty with PT and pt and family have decided that he would benefit from a SNF placement for addition assistance.  Objective:   VITALS:   Filed Vitals:   12/08/12 0445  BP: 126/51  Pulse: 66  Temp: 97.5 F (36.4 C)  Resp: 16    Neurovascular intact Dorsiflexion/Plantar flexion intact Incision: scant drainage No cellulitis present Compartment soft  LABS  Recent Labs  12/06/12 0331 12/07/12 0930  HGB 8.9* 8.9*  HCT 27.0* 26.4*  WBC 15.4* 12.7*  PLT 144* 118*     Recent Labs  12/06/12 0331  NA 135  K 4.6  BUN 14  CREATININE 0.86  GLUCOSE 158*     Assessment/Plan: 4 Days Post-Op Procedure(s) (LRB): LEFT TOTAL HIP REVISION (Left) Up with therapy Discharge to SNF eventually, when ready SW consult placed for SNF placement. Dressing changed     [name]. Addilee Neu   PAC  12/08/2012, 9:27 AM

## 2012-12-08 NOTE — Progress Notes (Signed)
Physical Therapy Treatment Patient Details Name: Austin [name] MRN: [id number] DOB: December 17, 1926 Today's Date: 12/08/2012 Time: 4098-1191 PT Time Calculation (min): 32 min  PT Assessment / Plan / Recommendation  History of Present Illness 79 yo male s/p L THA revision. Post-op hypotension. Hx of AAA, chronic A flutter.    PT Comments   POD # 4 L THRevision planning to D/C to home today.  Pt OOB in recliner.  Assisted to amb in hallway then practice going up on step backward with RW due to [facility].  Performed THR TE's and was given HEP handout.  Instructed on use of ICE and reviewed THP also with handout.   Follow Up Recommendations  Home health PT;Supervision/Assistance - 24 hour     Does the patient have the potential to tolerate intense rehabilitation     Barriers to Discharge        Equipment Recommendations  Rolling walker with 5" wheels    Recommendations for Other Services    Frequency 7X/week   Progress towards PT Goals Progress towards PT goals: Progressing toward goals  Plan      Precautions / Restrictions Precautions Precautions: Posterior Hip;Fall Precaution Comments: Pt recalls 3/3 THP and is more aware during functional activities Restrictions Weight Bearing Restrictions: Yes LLE Weight Bearing: Partial weight bearing LLE Partial Weight Bearing Percentage or Pounds: 50% Other Position/Activity Restrictions: LLE PWB 50%    Pertinent Vitals/Pain C/o 5/10 with ACT ICE applied Pre medicated    Mobility  Bed Mobility Bed Mobility: Not assessed Supine to Sit: 3: Mod assist;HOB elevated (30 degrees) Details for Bed Mobility Assistance: Pt OOB in recliner Transfers Transfers: Sit to Stand;Stand to Sit Sit to Stand: 3: Mod assist;From chair/3-in-1;From toilet Stand to Sit: 3: Mod assist;To chair/3-in-1;To toilet Details for Transfer Assistance:  increased time and 25% VC's to avoid hip flex > 90'  Ambulation/Gait Ambulation/Gait Assistance: 4: Min guard;4:  Min Environmental consultant (Feet): 155 Feet Assistive device: Rolling walker Ambulation/Gait Assistance Details: increased time and <25% VC's safety with turns and backward gait Gait Pattern: Step-to pattern;Decreased stride length;Decreased step length - left;Antalgic Gait velocity: decreased Stairs: Yes Stairs Assistance: 4: Min assist Stairs Assistance Details (indicate cue type and reason): 50% VC's on proper tech and sequencing Stair Management Technique: No rails;Backwards;With walker Number of Stairs: 1    Exercises   Total Hip Replacement TE's 10 reps ankle pumps 10 reps knee presses 10 reps heel slides 10 reps SAQ's 10 reps ABD Followed by ICE    PT Goals (current goals can now be found in the care plan section)    Visit Information  Last PT Received On: 12/08/12 Assistance Needed: +1 History of Present Illness: 79 yo male s/p L THA revision. Post-op hypotension. Hx of AAA, chronic A flutter.     Subjective Data      Cognition  Cognition Arousal/Alertness: Awake/alert Behavior During Therapy: WFL for tasks assessed/performed Overall Cognitive Status: Within Functional Limits for tasks assessed    Balance     End of Session PT - End of Session Equipment Utilized During Treatment: Gait belt Activity Tolerance: Patient tolerated treatment well Patient left: in chair;with call bell/phone within reach   Felecia Shelling  PTA [facility]  Acute  Rehab Pager      [phone]

## 2012-12-08 NOTE — Progress Notes (Signed)
Occupational Therapy Treatment Patient Details Name: Austin [name] MRN: [id number] DOB: 1926-12-06 Today's Date: 12/08/2012 Time: 1308-6578 OT Time Calculation (min): 30 min  OT Assessment / Plan / Recommendation  History of present illness 79 yo male s/p L THA revision. Post-op hypotension. Hx of AAA, chronic A flutter.    OT comments  Pt more fatiqued today.  Continue to recommend HHOT to reinforce education  Follow Up Recommendations  Home health OT    Barriers to Discharge       Equipment Recommendations  None recommended by OT (pt has wide commode from son)    Recommendations for Other Services    Frequency Min 2X/week   Progress towards OT Goals Progress towards OT goals: Progressing toward goals  Plan Discharge plan remains appropriate    Precautions / Restrictions Precautions Precautions: Posterior Hip;Fall Precaution Comments: reinforced thps.  Pt needs cues in context to extend LLE during sit to stand and not reaching forward in sitting Restrictions LLE Weight Bearing: Partial weight bearing LLE Partial Weight Bearing Percentage or Pounds: 50%   Pertinent Vitals/Pain Pt denied pain but grimaced once and said knee hurt with movement.  Repositioned    ADL  Lower Body Dressing: Simulated;Moderate assistance (with reacher (pillowcase)) Toilet Transfer: Performed;Moderate assistance (for sit to stand; min A steps, cues) Toilet Transfer Method: Sit to stand Toilet Transfer Equipment: Extra wide bedside commode Toileting - Clothing Manipulation and Hygiene: Moderate assistance Where Assessed - Toileting Clothing Manipulation and Hygiene: Standing Equipment Used: Reacher;Rolling walker Transfers/Ambulation Related to ADLs: Pt assisted to 3:1 commode, mod A for sit to stand and min A for steps.  Cues to extend LLE ADL Comments: Practiced reacher again with pillowcase as pt does not have clothing here.  cues to keep pulling on different parts of pillowcase to get to  knees.  He was able to do this.  He appears more fatiqued today and dyspnea 2/4 with activity.  Did not practice shower transfer:  recommended that he practice with HHOT.  Pt still has step to do with PT this am.      OT Diagnosis:    OT Problem List:   OT Treatment Interventions:     OT Goals(current goals can now be found in the care plan section)    Visit Information  Last OT Received On: 12/08/12 Assistance Needed: +1 (SPT) History of Present Illness: 79 yo male s/p L THA revision. Post-op hypotension. Hx of AAA, chronic A flutter.     Subjective Data      Prior Functioning       Cognition  Cognition Arousal/Alertness: Awake/alert Behavior During Therapy: WFL for tasks assessed/performed Overall Cognitive Status: Within Functional Limits for tasks assessed    Mobility  Bed Mobility Supine to Sit: 3: Mod assist;HOB elevated (30 degrees) Details for Bed Mobility Assistance: Assist for L LE onto bed.  Transfers Sit to Stand: 3: Mod assist;From elevated surface;With upper extremity assist;From bed;From chair/3-in-1;With armrests Details for Transfer Assistance: assist to rise and steady    Exercises      Balance     End of Session OT - End of Session Activity Tolerance: Patient limited by fatigue Patient left: in chair;with call bell/phone within reach;with family/visitor present  GO     Grettel Rames 12/08/2012, 9:51 AM Marica Otter, OTR/L [phone] 12/08/2012

## 2012-12-08 NOTE — Progress Notes (Signed)
   Subjective: 3 Days Post-Op Procedure(s) (LRB): LEFT TOTAL HIP REVISION (Left)   Patient reports pain as mild, pain well controlled. No events throughout the night.   Objective:   VITALS:   Filed Vitals:   12/07/12 0439  BP: 113/57  Pulse: 67  Temp: 97.7 F (36.5 C)  Resp: 19    Neurovascular intact Dorsiflexion/Plantar flexion intact Incision: scant drainage No cellulitis present Compartment soft  LABS  Recent Labs  12/06/12 0331 12/07/12 0930  HGB 8.9* 8.9*  HCT 27.0* 26.4*  WBC 15.4* 12.7*  PLT 144* 118*     Recent Labs  12/06/12 0331  NA 135  K 4.6  BUN 14  CREATININE 0.86  GLUCOSE 158*     Assessment/Plan: 3 Days Post-Op Procedure(s) (LRB): LEFT TOTAL HIP REVISION (Left) Now on telemetry, off the stepdown unit Up with therapy Discharge home with home health eventually, when ready  Expected ABLA  Treated with iron and will observe  Obese (BMI 30-39.9) Estimated body mass index is 37.29 kg/(m^2) as calculated from the following:   Height as of this encounter: 6\' 5"  (1.956 m).   Weight as of this encounter: 142.656 kg (314 lb 8 oz). Patient also counseled that weight may inhibit the healing process Patient counseled that losing weight will help with future health issues      [name]. Tula Schryver   PAC  12/08/2012, 1:57 PM

## 2012-12-09 NOTE — Progress Notes (Signed)
Clinical Social Work Department BRIEF PSYCHOSOCIAL ASSESSMENT 12/09/2012  Patient:  Austin [name], Austin [name]     Account Number:  [id number]     Admit date:  12/04/2012  Clinical Social Worker:  Doroteo Glassman  Date/Time:  12/09/2012 01:07 PM  Referred by:  Physician  Date Referred:  12/09/2012 Referred for  SNF Placement   Other Referral:   Interview type:  Patient Other interview type:   Pt's son and daughter at bedside    PSYCHOSOCIAL DATA Living Status:  FAMILY Admitted from facility:   Level of care:   Primary support name:  Austin [name] and Austin [name] Primary support relationship to patient:  CHILD, ADULT Degree of support available:   strong    CURRENT CONCERNS Current Concerns  Post-Acute Placement   Other Concerns:    SOCIAL WORK ASSESSMENT / PLAN Met with Pt and children at bedside.    Pt stated that he was supposed to go to [facility] but that he learned yesterday that he's not a candidate.  Pt's daughter cares for Pt in the home and the family realized that she is not able to provided all the care Pt needs.  Thus, they all agree that SNF is in order.    Provided Pt and his family with a SNF list and discussed the process.  Pt lives 7 miles from [facility] and may be interested in that facility; daughter may tour the facility today.    Pt gave CSW permission to send his information to [facility].    CSW thanked Pt and his family for their time.   Assessment/plan status:  Psychosocial Support/Ongoing Assessment of Needs Other assessment/ plan:   Information/referral to community resources:   Snf list    PATIENT'S/FAMILY'S RESPONSE TO PLAN OF CARE: Pt is happy to go to SNF, as he feels more comfortable with professionals helping him with his ADLs than his daughter. Pt's daughter agreed and stated that he is too proud to ask her for help.    Pt and family thanked CSW for time and assistance.   Providence Crosby, LCSWA Clinical Social Work [phone]

## 2012-12-09 NOTE — Progress Notes (Signed)
Clinical Social Work Department CLINICAL SOCIAL WORK PLACEMENT NOTE 12/09/2012  Patient:  Austin [name], Austin [name]  Account Number:  [id number] Admit date:  12/04/2012  Clinical Social Worker:  Doroteo Glassman  Date/time:  12/09/2012 01:12 PM  Clinical Social Work is seeking post-discharge placement for this patient at the following level of care:   SKILLED NURSING   (*CSW will update this form in Epic as items are completed)   12/09/2012  Patient/family provided with [facility] Department of Clinical Social Work's list of facilities offering this level of care within the geographic area requested by the patient (or if unable, by the patient's family).  12/09/2012  Patient/family informed of their freedom to choose among providers that offer the needed level of care, that participate in Medicare, Medicaid or managed care program needed by the patient, have an available bed and are willing to accept the patient.  12/09/2012  Patient/family informed of [facility]' ownership interest in [facility], as well as of the fact that they are under no obligation to receive care at this facility.  PASARR submitted to [employer] on 12/09/2012 PASARR number received from [employer] on 12/09/2012  FL2 transmitted to all facilities in geographic area requested by pt/family on  12/09/2012 FL2 transmitted to all facilities within larger geographic area on   Patient informed that his/her managed care company has contracts with or will negotiate with  certain facilities, including the following:     Patient/family informed of bed offers received:   Patient chooses bed at  Physician recommends and patient chooses bed at    Patient to be transferred to  on   Patient to be transferred to facility by   The following physician request were entered in Epic:   Additional Comments:  Providence Crosby, Theresia Majors Clinical Social Work [phone]

## 2012-12-09 NOTE — Progress Notes (Signed)
Physical Therapy Treatment Patient Details Name: Austin [name] MRN: [id number] DOB: [birth year] Today's Date: 12/09/2012 Time: 4098-1191 PT Time Calculation (min): 45 min  PT Assessment / Plan / Recommendation  History of Present Illness 79 yo male s/p L THA revision. Post-op hypotension. Hx of AAA, chronic A flutter.    PT Comments   Progressing slowly with mobility. Family present during session-expressed concern about being able to manage with pt at home. Feel ST rehab stay may be warranted to improve strength, activity tolerance, gait and to decrease burden of care. Notified RN and SW awaiting outside to speak with family. Pt is agreeable to ST rehab stay at this time.   Follow Up Recommendations  SNF     Does the patient have the potential to tolerate intense rehabilitation     Barriers to Discharge        Equipment Recommendations  Rolling walker with 5" wheels    Recommendations for Other Services OT consult  Frequency 7X/week   Progress towards PT Goals Progress towards PT goals: Progressing toward goals  Plan Discharge plan needs to be updated    Precautions / Restrictions Precautions Precautions: Posterior Hip;Fall Precaution Comments: Cues for adherence to WB precautions Restrictions Weight Bearing Restrictions: Yes LLE Weight Bearing: Partial weight bearing LLE Partial Weight Bearing Percentage or Pounds: 50%   Pertinent Vitals/Pain L LE "discomfort"-pt does grimace with ROM exercise however he denies pain.     Mobility  Bed Mobility Bed Mobility: Supine to Sit;Sit to Supine Supine to Sit: 4: Min assist Sit to Supine: 4: Min assist Details for Bed Mobility Assistance: VC s for safety, technique, hand placement. Assist for L LE off/onto bed.  Transfers Transfers: Sit to Stand;Stand to Sit Sit to Stand: 3: Mod assist;From bed Stand to Sit: 3: Mod assist;To chair/3-in-1;To bed Details for Transfer Assistance: Assist to rise, stabilize, control descent.  VCs safety, technique, hand placement. Increased time.  Ambulation/Gait Ambulation/Gait Assistance: 4: Min assist Ambulation Distance (Feet): 55 Feet Ambulation/Gait Assistance Details: VCS safety, technique, sequence. Assist to stabilize intermittently. Fatigued easily this session. Dyspnea 3/4.  Gait Pattern: Step-to pattern;Decreased stride length;Antalgic;Decreased step length - left Stairs: No Stairs Assistance Details (indicate cue type and reason): Pt stated he did not feel he needed to practice.     Exercises Total Joint Exercises Ankle Circles/Pumps: AROM;Both;10 reps;Supine Quad Sets: AROM;Both;10 reps;Supine Heel Slides: AAROM;AROM;Left;5 reps;Supine Hip ABduction/ADduction: AAROM;Left;5 reps;Supine   PT Diagnosis:    PT Problem List:   PT Treatment Interventions:     PT Goals (current goals can now be found in the care plan section)    Visit Information  Last PT Received On: 12/09/12 Assistance Needed: +1 PT/OT Co-Evaluation/Treatment: Yes History of Present Illness: 79 yo male s/p L THA revision. Post-op hypotension. Hx of AAA, chronic A flutter.     Subjective Data      Cognition  Cognition Arousal/Alertness: Awake/alert Behavior During Therapy: WFL for tasks assessed/performed Overall Cognitive Status: Within Functional Limits for tasks assessed    Balance     End of Session PT - End of Session Activity Tolerance: Patient limited by fatigue Patient left: in bed;with call bell/phone within reach Nurse Communication: Mobility status;Weight bearing status;Precautions (on white board in room)   GP     Rebeca Alert, MPT Pager: [phone]

## 2012-12-09 NOTE — Progress Notes (Signed)
Occupational Therapy Evaluation Patient Details Name: Austin [name] MRN: [id number] DOB: 07-Nov-1926 Today's Date: 12/09/2012 Time: 0865-7846 OT Time Calculation (min): 42 min  OT Assessment / Plan / Recommendation History of present illness 79 yo male s/p L THA revision. Post-op hypotension. Hx of AAA, chronic A flutter.    Clinical Impression   Pt continues to require min to mod assist with mobility and mod assist for ADL.  Pt and family concerned that pt cannot be managed in the home with his daughter at this level.  Pt with decreased activity tolerance contributing to dependence.  Pt will need SNF for rehab prior to return home.  Pt and family in agreement.    OT Assessment       Follow Up Recommendations  SNF    Barriers to Discharge      Equipment Recommendations  None recommended by OT    Recommendations for Other Services    Frequency  Min 2X/week    Precautions / Restrictions Precautions Precautions: Posterior Hip;Fall Precaution Comments: Cues for adherence to WB and hip precautions. Restrictions Weight Bearing Restrictions: Yes LLE Weight Bearing: Partial weight bearing LLE Partial Weight Bearing Percentage or Pounds: 50% Other Position/Activity Restrictions: LLE PWB 50%   Pertinent Vitals/Pain "discomfort" L hip, repositioned    ADL  Grooming: Wash/dry hands;Set up Where Assessed - Grooming: Unsupported sitting Lower Body Bathing: Moderate assistance Where Assessed - Lower Body Bathing: Supported sit to stand Lower Body Dressing: Simulated;Moderate assistance (with shorts and reacher) Where Assessed - Lower Body Dressing: Unsupported sitting;Supported sit to stand Toilet Transfer: Minimal assistance Toilet Transfer Method: Sit to stand Toilet Transfer Equipment: Extra wide bedside commode Toileting - Clothing Manipulation and Hygiene: Minimal assistance Where Assessed - Glass blower/designer Manipulation and Hygiene: Standing Equipment Used:  Reacher;Rolling walker Transfers/Ambulation Related to ADLs: Pt assisted to 3:1 commode, mod A for sit to stand and min A for steps.  Cues to extend LLE ADL Comments: Pt with bowel leakage upon standing requiring change of clothing.  Son observing and realizing that his sister will not be able to manage the level of care pt requires.  Discussed pt's mobility and ADL status.  Pt will need ST rehab prior to return home.  Family with confusion about the various d/c environment options, explained SNF recommendation vs HH.  Pt is too high functioning for CIR referral.    OT Diagnosis:    OT Problem List:   OT Treatment Interventions:     OT Goals(Current goals can be found in the care plan section) Acute Rehab OT Goals Patient Stated Goal: be able to take care of himself  Visit Information  Last OT Received On: 12/09/12 Assistance Needed: +1 PT/OT Co-Evaluation/Treatment: Yes History of Present Illness: 79 yo male s/p L THA revision. Post-op hypotension. Hx of AAA, chronic A flutter.        Prior Functioning               Vision/Perception     Cognition  Cognition Arousal/Alertness: Awake/alert Behavior During Therapy: WFL for tasks assessed/performed Overall Cognitive Status: Within Functional Limits for tasks assessed    Extremity/Trunk Assessment       Mobility Bed Mobility Bed Mobility: Supine to Sit;Sit to Supine Supine to Sit: 4: Min assist Sit to Supine: 4: Min assist Details for Bed Mobility Assistance: VC s for safety, technique, hand placement. Assist for L LE off/onto bed.  Transfers Sit to Stand: 3: Mod assist;From bed Stand to Sit: 3: Mod assist;To chair/3-in-1;To  bed Details for Transfer Assistance: Assist to rise, stabilize, control descent. VCs safety, technique, hand placement. Increased time.      Exercise   Balance     End of Session OT - End of Session Activity Tolerance: Patient limited by fatigue Patient left: in bed;with call bell/phone  within reach;with family/visitor present Nurse Communication:  (d/c disposition)  GO     Evern Bio 12/09/2012, 12:45 PM [phone]

## 2012-12-09 NOTE — Progress Notes (Addendum)
   Subjective: 5 Days Post-Op Procedure(s) (LRB): LEFT TOTAL HIP REVISION (Left)    Seen with Dr. Darrelyn Hillock: Patient reports pain as mild, pain well controlled. Some edema of the arms, but otherwise no events. BP and HR have been normal. He had an episode of pain after sitting up for a long time in the chair. Have discussed limiting the sitting up until able to tolerate longer, his endurance will continually build.  Objective:   VITALS:   Filed Vitals:   12/09/12  BP: 11557  Pulse: 64  Temp: 97.5 F (36.4 C)   Resp: 16    Neurovascular intact Dorsiflexion/Plantar flexion intact Incision: scant drainage No cellulitis present Compartment soft  LABS  Recent Labs  12/07/12 0930  HGB 8.9*  HCT 26.4*  WBC 12.7*  PLT 118*    No results found for this basename: NA, K, BUN, CREATININE, GLUCOSE,  in the last 72 hours   Assessment/Plan: 5 Days Post-Op Procedure(s) (LRB): LEFT TOTAL HIP REVISION (Left)  Continue daily and as needed dressings of the HV drain site. Up with therapy Discharge to SNF eventually, when ready SW will work on the appropriate plans, appreciate the assistance with this patient.    [name] Verda Mehta   PAC  12/09/2012, 10:01 AM

## 2012-12-10 NOTE — Progress Notes (Signed)
Subjective: 6 Days Post-Op Procedure(s) (LRB): LEFT TOTAL HIP REVISION (Left) Patient reports pain as mild.  No c/o.  No n/v/f/c.  Objective: Vital signs in last 24 hours: Temp:  [97.7 F (36.5 C)-98.2 F (36.8 C)] 97.7 F (36.5 C) (08/24 0526) Pulse Rate:  [66-105] 66 (08/24 0526) Resp:  [16-18] 18 (08/24 0526) BP: (117-126)/(59-68) 126/68 mmHg (08/24 0526) SpO2:  [92 %-98 %] 92 % (08/24 0526)  Intake/Output from previous day: 08/23 0701 - 08/24 0700 In: 360 [P.O.:360] Out: 1475 [Urine:1475] Intake/Output this shift: Total I/O In: -  Out: 550 [Urine:550]  No results found for this basename: HGB,  in the last 72 hours No results found for this basename: WBC, RBC, HCT, PLT,  in the last 72 hours No results found for this basename: NA, K, CL, CO2, BUN, CREATININE, GLUCOSE, CALCIUM,  in the last 72 hours No results found for this basename: LABPT, INR,  in the last 72 hours  PE:  Wn wd male in nad.  L hip wounds dressed and dry.  NVI at L LE.  Assessment/Plan: 6 Days Post-Op Procedure(s) (LRB): LEFT TOTAL HIP REVISION (Left) Discharge to SNF tomorrow.  Toni Arthurs 12/10/2012, 12:46 PM

## 2012-12-10 NOTE — Progress Notes (Signed)
Physical Therapy Treatment Patient Details Name: Austin [name] MRN: [id number] DOB: [birth year] Today's Date: 12/10/2012 Time: 1202-1214 PT Time Calculation (min): 12 min  PT Assessment / Plan / Recommendation  History of Present Illness 79 yo male s/p L THA revision. Post-op hypotension. Hx of AAA, chronic A flutter.    PT Comments   PM session, assisted pt out of recliner to amb in hallway second time then assisted back to bed per pt request.  Positioned with pillows and applied ICE.   Follow Up Recommendations  SNF      Does the patient have the potential to tolerate intense rehabilitation     Barriers to Discharge        Equipment Recommendations       Recommendations for Other Services    Frequency 7X/week   Progress towards PT Goals Progress towards PT goals: Progressing toward goals  Plan      Precautions / Restrictions Precautions Precautions: Posterior Hip;Fall Precaution Comments: Pt recalls 3/3 THP and aware PWB when asked Restrictions Weight Bearing Restrictions: Yes LLE Weight Bearing: Partial weight bearing LLE Partial Weight Bearing Percentage or Pounds: 50% Other Position/Activity Restrictions: LLE PWB 50%    Pertinent Vitals/Pain C/o "soreness" ICE applied repositioned    Mobility  Bed Mobility Bed Mobility: Sit to Supine Supine to Sit: 4: Min assist Sitting - Scoot to Edge of Bed: 4: Min assist Details for Bed Mobility Assistance: Mod assist B LE to support up onto bed Transfers Transfers: Sit to Stand;Stand to Sit Sit to Stand: 3: Mod assist;4: Min assist;From chair/3-in-1 Stand to Sit: 3: Mod assist;4: Min assist;To bed Details for Transfer Assistance: increased time  Ambulation/Gait Ambulation/Gait Assistance: 4: Min assist Ambulation Distance (Feet): 64 Feet Assistive device: Rolling walker Ambulation/Gait Assistance Details: 25% VC's on upright posture and increased time Gait Pattern: Step-to pattern;Decreased stride  length;Antalgic;Decreased step length - left Gait velocity: decreased     PT Goals (current goals can now be found in the care plan section)    Visit Information  Last PT Received On: 12/10/12 Assistance Needed: +1 History of Present Illness: 79 yo male s/p L THA revision. Post-op hypotension. Hx of AAA, chronic A flutter.     Subjective Data      Cognition       Balance     End of Session PT - End of Session Equipment Utilized During Treatment: Gait belt Activity Tolerance: Patient limited by fatigue Patient left: in bed;with call bell/phone within reach Nurse Communication: Mobility status;Weight bearing status;Precautions   Felecia Shelling  PTA [facility]  Acute  Rehab Pager      [phone]

## 2012-12-10 NOTE — Progress Notes (Signed)
Physical Therapy Treatment Patient Details Name: Austin [name] MRN: [id number] DOB: May 14, 1926 Today's Date: 12/10/2012 Time: 2725-3664 PT Time Calculation (min): 33 min  PT Assessment / Plan / Recommendation  History of Present Illness 79 yo male s/p L THA revision. Post-op hypotension. Hx of AAA, chronic A flutter.    PT Comments   POD # 6 am session.  Assisted OOB with increased time and amb in hallway limited distance due to DOE 2/4.  Positioned in recliner then performed TE's.  Pt plans to D/C to SNF for ST Rehab.  Follow Up Recommendations  SNF     Does the patient have the potential to tolerate intense rehabilitation     Barriers to Discharge        Equipment Recommendations       Recommendations for Other Services    Frequency 7X/week   Progress towards PT Goals Progress towards PT goals: Progressing toward goals  Plan      Precautions / Restrictions Precautions Precautions: Posterior Hip;Fall Precaution Comments: Pt recalls 3/3 THP and aware PWB when asked Restrictions Weight Bearing Restrictions: Yes LLE Weight Bearing: Partial weight bearing LLE Partial Weight Bearing Percentage or Pounds: 50%    Pertinent Vitals/Pain C/o "stiffness" in the hip ICE applied    Mobility  Bed Mobility Bed Mobility: Supine to Sit;Sitting - Scoot to Edge of Bed Supine to Sit: 4: Min assist Sitting - Scoot to [place] of Bed: 4: Min assist Details for Bed Mobility Assistance: VC s for safety, technique, hand placement. Assist for L LE off bed.  Transfers Transfers: Sit to Stand;Stand to Sit Sit to Stand: 3: Mod assist;From bed Stand to Sit: 3: Mod assist;To chair/3-in-1;To bed Details for Transfer Assistance: Assist to rise, stabilize, control descent. VCs safety, technique, hand placement. Increased time.  Ambulation/Gait Ambulation/Gait Assistance: 4: Min assist Ambulation Distance (Feet): 58 Feet Assistive device: Rolling walker Ambulation/Gait Assistance Details:  limited activity tolerance/fatigues quickly then demon increased gait unsteadyness.  VC's for upright posture and proper walker to self distance esp with turns. Gait Pattern: Step-to pattern;Decreased stride length;Antalgic;Decreased step length - left Gait velocity: decreased    Exercises  10 reps AROM AP 10 reps AROM knee presses 10 reps AAROM knee flex     PT Goals (current goals can now be found in the care plan section)    Visit Information  Last PT Received On: 12/10/12 Assistance Needed: +1 History of Present Illness: 79 yo male s/p L THA revision. Post-op hypotension. Hx of AAA, chronic A flutter.     Subjective Data      Cognition       Balance     End of Session PT - End of Session Equipment Utilized During Treatment: Gait belt Activity Tolerance: Patient limited by fatigue Patient left: in chair;with bed alarm set Nurse Communication: Mobility status;Weight bearing status;Precautions   Felecia Shelling  PTA [facility]  Acute  Rehab Pager      [phone]

## 2012-12-11 NOTE — Discharge Summary (Signed)
Physician Discharge Summary  Patient ID: Austin [name] MRN: [id number] DOB/AGE: 1926/04/22 79 y.o.  Admit date: 12/04/2012 Discharge date:  12/11/2012  Procedures:  Procedure(s) (LRB): LEFT TOTAL HIP REVISION (Left)  Attending Physician:  Dr. Durene Romans   Admission Diagnoses:   Left hip pain S/P left THA  Discharge Diagnoses:  Principal Problem:   S/P left TH revision Active Problems:   Postoperative hypovolemic shock   Chronic atrial flutter   Acute blood loss anemia   Obese  Past Medical History  Diagnosis Date  . Hypertension   . Hyperlipidemia   . AAA (abdominal aortic aneurysm)   . Arthritis   . CAD (coronary artery disease)   . Thyroid disease     hypothyroidism  . Dysrhythmia     atrial flutter  . Shortness of breath     a little with exertion  . Polio 1934    HPI: Austin [name], 79 y.o. male, has a history of pain and functional disability in the left hip due to pain from previous total hip arthroplasty. The indications for the revision total hip arthroplasty are loosening of one or more components and pain. Onset of symptoms was gradual starting 2-2.5 years ago with gradually worsening course since that time. Prior procedures on the left hip include arthroplasty, in 1994. Patient currently rates pain in the left hip at 6 out of 10 with activity. There is worsening of pain with activity and weight bearing, trendelenberg gait, pain that interfers with activities of daily living, pain with passive range of motion and getting up and initial steps after getting up from a seated position. Patient has evidence of prosthetic loosening by imaging studies. This condition presents safety issues increasing the risk of falls. There is no current active signs of infection. Risks, benefits and expectations were discussed with the patient. Patient understand the risks, benefits and expectations and wishes to proceed with surgery.   PCP: Ailene Ravel, MD    Discharged Condition: good  Hospital Course:  Patient underwent the above stated procedure on 12/04/2012. Patient tolerated the procedure well and brought to the recovery room. In the recovery room he was having issues with both HR and BP.  From the recovery room he was brought to the stepdown unit with a neo drip and for closer monitoring. After 2 days in the stepdown unit he was stable enough to be transferred to the telemetry floor for continued monitoring. After a day in telemetry he was stable enough to stop monitoring. He was working hard with PT in the hospital, but slowly. He was max assist being that he is limited weight bearing on the left leg.   He does have some drainage from the area where the hemovac drain was removed. This is from his anticoagulation on Xarelto and should eventually stop.  Daily dressing changes to the area and he has been placed on antibiotics to continue until the drainage stops. After being sent to telemetry his vitals remained stable. It was felt that he was doing well enough through the weekend that he could be discharged to a SNF.    Discharge Exam: General appearance: alert, cooperative and no distress Extremities: Homans sign is negative, no sign of DVT, no edema, redness or tenderness in the calves or thighs and no ulcers, gangrene or trophic changes  Disposition: SNF  with follow up in 2 weeks   Follow-up Information   Follow up with Shelda Pal, MD. Schedule an appointment as soon as possible  for a visit in 2 weeks.   Specialty:  Orthopedic Surgery   Contact information:   [address] [place] [address] [phone]        Discharge Orders   Future Appointments Provider Department Dept Phone   10/28/2014 8:30 AM [facility] [phone]   Eat a light meal the night before the exam but please avoid gaseous foods.   Nothing to eat or drink for at least 8 hours prior to the exam.  No gum chewing or smoking the morning of the exam. Please take your morning medications with small sips of water, especially blood pressure medication. If you have several vascular lab exams and will see physician, please bring a snack with you.   10/28/2014 9:00 AM Nada Libman, MD [facility] [phone]   Future Orders Complete By Expires   Call MD / Call 911  As directed    Comments:     If you experience chest pain or shortness of breath, CALL 911 and be transported to the hospital emergency room.  If you develope a fever above 101 F, pus (white drainage) or increased drainage or redness at the wound, or calf pain, call your surgeon's office.   Change dressing  As directed    Comments:     Daily dressing changes with 4x4 guaze and tape, especially over the drain site. Keep the area dry and clean. Use of Keflex antibiotic until the hemo vac drain site stops draining.   Constipation Prevention  As directed    Comments:     Drink plenty of fluids.  Prune juice may be helpful.  You may use a stool softener, such as Colace (over the counter) 100 mg twice a day.  Use MiraLax (over the counter) for constipation as needed.   Diet - low sodium heart healthy  As directed    Discharge instructions  As directed    Comments:     Daily dressing changes with 4x4 gauze and tape on both the incision and the drain site.  Keep the area dry and clean until follow up. Follow up in 2 weeks at [facility]. Call with any questions or concerns.   Partial weight bearing  As directed    Comments:     50% WB left leg   TED hose  As directed    Comments:     Use stockings (TED hose) for 2 weeks on both leg(s).  You may remove them at night for sleeping.        Medication List         aspirin 81 MG tablet  Take 81 mg by mouth daily.     cephALEXin 500 MG capsule  Commonly known as:  KEFLEX  Take 1 capsule (500 mg total) by mouth 3 (three) times daily.      cholecalciferol 1000 UNITS tablet  Commonly known as:  VITAMIN D  Take 1,000 Units by mouth daily.     DSS 100 MG Caps  Take 100 mg by mouth 2 (two) times daily.     ferrous sulfate 325 (65 FE) MG tablet  Take 1 tablet (325 mg total) by mouth 3 (three) times daily after meals.     HYDROcodone-acetaminophen 7.5-325 MG per tablet  Commonly known as:  NORCO  Take 1-2 tablets by mouth every 4 (four) hours as needed for pain.     levothyroxine 75 MCG tablet  Commonly known  as:  SYNTHROID, LEVOTHROID  Take 75 mcg by mouth daily before breakfast.     losartan 50 MG tablet  Commonly known as:  COZAAR  Take 50 mg by mouth every morning.     lovastatin 40 MG tablet  Commonly known as:  MEVACOR  Take 40 mg by mouth every morning.     metoprolol succinate 25 MG 24 hr tablet  Commonly known as:  TOPROL-XL  Take 25 mg by mouth every morning.     multivitamin with minerals Tabs tablet  Take 1 tablet by mouth daily.     polyethylene glycol packet  Commonly known as:  MIRALAX / GLYCOLAX  Take 17 g by mouth 2 (two) times daily.     XARELTO 20 MG Tabs tablet  Generic drug:  Rivaroxaban  Take 20 mg by mouth daily.         Signed: [name]. Destony Prevost   PAC  12/11/2012, 7:33 AM

## 2012-12-11 NOTE — Progress Notes (Signed)
   Subjective: 7 Days Post-Op Procedure(s) (LRB): LEFT TOTAL HIP REVISION (Left)   Patient reports pain as mild, pain well controlled. No events throughout the night. Feels that he is working well with PT, but slowly.  Objective:   VITALS:   Filed Vitals:   12/11/12 0511  BP: 99/62  Pulse: 66  Temp: 97.8 F (36.6 C)  Resp: 18    Neurovascular intact Dorsiflexion/Plantar flexion intact Incision: scant drainage No cellulitis present Compartment soft    LABS No new labs  Assessment/Plan: 7 Days Post-Op Procedure(s) (LRB): LEFT TOTAL HIP REVISION (Left) Up with therapy Discharge to SNF Follow up in 2 weeks at [facility] Follow up with OLIN,Bunyan Brier D in 2 weeks.  Contact information:  [facility] [address], [address] [place] [address] [phone]        [name]. Gottlieb Zuercher   PAC  12/11/2012, 7:30 AM

## 2012-12-11 NOTE — Clinical Social Work Placement (Signed)
     Clinical Social Work Department CLINICAL SOCIAL WORK PLACEMENT NOTE 12/11/2012  Patient:  Austin [name], Austin [name]  Account Number:  [id number] Admit date:  12/04/2012  Clinical Social Worker:  Doroteo Glassman  Date/time:  12/09/2012 01:12 PM  Clinical Social Work is seeking post-discharge placement for this patient at the following level of care:   SKILLED NURSING   (*CSW will update this form in Epic as items are completed)   12/09/2012  Patient/family provided with [facility] Department of Clinical Social Works list of facilities offering this level of care within the geographic area requested by the patient (or if unable, by the patients family).  12/09/2012  Patient/family informed of their freedom to choose among providers that offer the needed level of care, that participate in Medicare, Medicaid or managed care program needed by the patient, have an available bed and are willing to accept the patient.  12/09/2012  Patient/family informed of [facility] ownership interest in [facility], as well as of the fact that they are under no obligation to receive care at this facility.  PASARR submitted to EDS on 12/09/2012 PASARR number received from EDS on 12/09/2012  FL2 transmitted to all facilities in geographic area requested by pt/family on  12/09/2012 FL2 transmitted to all facilities within larger geographic area on   Patient informed that his/her managed care company has contracts with or will negotiate with  certain facilities, including the following:     Patient/family informed of bed offers received:  12/11/2012 Patient chooses bed at [facility], [place] Physician recommends and patient chooses bed at    [facility] [place] on  12/11/2012 Patient to be transferred to facility by ptar  The following physician request were entered in Epic:   Additional Comments:

## 2012-12-11 NOTE — Progress Notes (Signed)
Report called to RN at [facility]. Discharge reviewed and all questions answered. Julio Sicks RN

## 2012-12-11 NOTE — Progress Notes (Signed)
Patient agreeable to go to [facility] and is discharged today. Packet copied and placed in [place]. Patient is glad to be going today and he states he will call his family and let them know. ptar called for transport.  Correne Lalani C. Shiane Wenberg MSW, LCSW [phone]

## 2012-12-11 NOTE — Progress Notes (Signed)
Physical Therapy Treatment Patient Details Name: Austin [name] MRN: [id number] DOB: December 24, 1926 Today's Date: 12/11/2012 Time: 6295-2841 PT Time Calculation (min): 25 min  PT Assessment / Plan / Recommendation  History of Present Illness 79 yo male s/p L THA revision. Post-op hypotension. Hx of AAA, chronic A flutter.    PT Comments   Assisted pt OOb to amb in hallway limited distance 2nd increased c/o fatigie from this mornings events of BM + bath.  Positioned in recliner chair and performed TE's.  Pt plans to D/C to SNF today.    Follow Up Recommendations  SNF     Does the patient have the potential to tolerate intense rehabilitation     Barriers to Discharge        Equipment Recommendations       Recommendations for Other Services    Frequency 7X/week   Progress towards PT Goals Progress towards PT goals: Progressing toward goals  Plan   SNF   Precautions / Restrictions Precautions Precautions: Posterior Hip;Fall Precaution Comments: Pt recalls 3/3 THP and aware PWB when asked Restrictions Weight Bearing Restrictions: Yes LLE Weight Bearing: Partial weight bearing LLE Partial Weight Bearing Percentage or Pounds: 50% Other Position/Activity Restrictions: LLE PWB 50%    Pertinent Vitals/Pain C/o 3/10 pain but more "soreness" ICE applied    Mobility  Bed Mobility Bed Mobility: Supine to Sit Supine to Sit: 4: Min guard Details for Bed Mobility Assistance: increased time pt was able to move L LE off bed in segments  Transfers Transfers: Sit to Stand;Stand to Sit Sit to Stand: 4: Min assist;From bed Stand to Sit: 4: Min assist;To chair/3-in-1 Details for Transfer Assistance: increased time  Ambulation/Gait Ambulation/Gait Assistance: 4: Min assist;4: Min guard Ambulation Distance (Feet): 46 Feet Ambulation/Gait Assistance Details: decreased amb distance 2nd increased c/o fatigue from this mornings event (BM/bath).   Gait Pattern: Step-to pattern;Decreased  stride length;Antalgic;Decreased step length - left Gait velocity: decreased    Exercises   Total Hip Replacement TE's 10 reps ankle pumps 10 reps knee presses 10 reps heel slides 10 reps SAQ's 10 reps ABD Followed by ICE   PT Goals (current goals can now be found in the care plan section)    Visit Information  Last PT Received On: 12/11/12 Assistance Needed: +1 History of Present Illness: 79 yo male s/p L THA revision. Post-op hypotension. Hx of AAA, chronic A flutter.     Subjective Data      Cognition       Balance     End of Session PT - End of Session Equipment Utilized During Treatment: Gait belt Activity Tolerance: Patient limited by fatigue Patient left: in chair;with call bell/phone within reach   Felecia Shelling  PTA [facility]  Acute  Rehab Pager      [phone]

## 2012-12-12 ENCOUNTER — Encounter: Payer: Self-pay | Admitting: Cardiovascular Disease

## 2012-12-12 ENCOUNTER — Encounter: Payer: Self-pay | Admitting: *Deleted

## 2012-12-13 ENCOUNTER — Encounter: Payer: Self-pay | Admitting: Cardiovascular Disease

## 2012-12-13 ENCOUNTER — Ambulatory Visit (INDEPENDENT_AMBULATORY_CARE_PROVIDER_SITE_OTHER): Payer: Medicare Other | Admitting: Cardiovascular Disease

## 2012-12-13 VITALS — BP 90/60 | HR 88 | Ht 77.0 in | Wt 280.0 lb

## 2012-12-13 DIAGNOSIS — I4892 Unspecified atrial flutter: Secondary | ICD-10-CM

## 2012-12-13 DIAGNOSIS — I2510 Atherosclerotic heart disease of native coronary artery without angina pectoris: Secondary | ICD-10-CM

## 2012-12-13 DIAGNOSIS — I10 Essential (primary) hypertension: Secondary | ICD-10-CM

## 2012-12-13 DIAGNOSIS — E785 Hyperlipidemia, unspecified: Secondary | ICD-10-CM

## 2012-12-13 NOTE — Progress Notes (Signed)
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 Durene Cal for cardiovascular preop clearance prior to aortobifemoral bypass grafting after a failed endoluminal stent graft for treatment of his abdominal aortic aneurysm. His risk factors include 100-pack-year history of tobacco abuse have quit 28 years ago, as well as treated hypertension. He was apparently cath'd 6 years ago or so and had no significant disease. Myoview performed May 05, 2010, showed inferior scar versus diaphragmatic attenuation without ischemia. Dr. Nathanial Rancher follows his lipid profile. Dr. Myra Gianotti follows his Dopplers, both of his carotids and his abdominal aorta. He was found to have asymptomatic A-flutter which was rate controlled. I did begin him on Xarelto because of a CHADS VASc2 score of 2. He has been asymptomatic on this. A 2D echo revealed normal ?LV systolic function with moderate left atrial enlargement measuring 43 mm.since I saw him a year ago he recently underwent left total hip replacement by Dr. Susy Frizzle:. He is in [facility] for rehabilitation.    Current Outpatient Prescriptions  Medication Sig Dispense Refill  . aspirin 81 MG tablet Take 81 mg by mouth daily.      . cephALEXin (KEFLEX) 500 MG capsule Take 1 capsule (500 mg total) by mouth 3 (three) times daily.  60 capsule  0  . cholecalciferol (VITAMIN D) 1000 UNITS tablet Take 1,000 Units by mouth daily.      [name] docusate sodium 100 MG CAPS Take 100 mg by mouth 2 (two) times daily.  10 capsule  0  . ferrous sulfate 325 (65 FE) MG tablet Take 1 tablet (325 mg total) by mouth 3 (three)  times daily after meals.    3  . HYDROcodone-acetaminophen (NORCO) 7.5-325 MG per tablet Take 1-2 tablets by mouth every 4 (four) hours as needed for pain.  100 tablet  0  . levothyroxine (SYNTHROID, LEVOTHROID) 75 MCG tablet Take 75 mcg by mouth daily before breakfast.      . losartan (COZAAR) 50 MG tablet Take 50 mg by mouth every morning.       . lovastatin (MEVACOR) 40 MG tablet Take 40 mg by mouth every morning.       . metoprolol succinate (TOPROL-XL) 25 MG 24 hr tablet Take 25 mg by mouth every morning.      . Multiple Vitamin (MULTIVITAMIN WITH MINERALS) TABS tablet Take 1 tablet by mouth daily.      . polyethylene glycol (MIRALAX / GLYCOLAX) packet Take 17 g by mouth 2 (two) times daily.  14 each  0  . Rivaroxaban (XARELTO) 20 MG TABS Take 20 mg by mouth daily.       No current facility-administered medications for this visit.    No Known Allergies  History   Social History  . Marital Status: Widowed    Spouse Name: N/A    Number of Children: 3  . Years of Education: N/A   Occupational History  . retired Futures trader    Social History Main Topics  . Smoking status: Former Smoker -- 1.00 packs/day for 25 years    Types: Cigarettes    Quit date: 04/19/1966  . Smokeless tobacco:  Never Used     Comment: 100 pack years per 08/2012 note  . Alcohol Use: 0.0 oz/week     Comment: glass of wine daily, scotch 3 or 4 times a week  . Drug Use: No  . Sexual Activity: Not on file   Other Topics Concern  . Not on file   Social History Narrative  . No narrative on file     Review of Systems: General: negative for chills, fever, night sweats or weight changes.  Cardiovascular: negative for chest pain, dyspnea on exertion, edema, orthopnea, palpitations, paroxysmal nocturnal dyspnea or shortness of breath Dermatological: negative for rash Respiratory: negative for cough or wheezing Urologic: negative for hematuria Abdominal: negative for nausea, vomiting, diarrhea, bright  red blood per rectum, melena, or hematemesis Neurologic: negative for visual changes, syncope, or dizziness All other systems reviewed and are otherwise negative except as noted above.    Blood pressure 90/60, pulse 88, height 6\' 5"  (1.956 m), weight 280 lb (127.007 kg).  General appearance: alert and no distress Neck: no adenopathy, no carotid bruit, no JVD, supple, symmetrical, trachea midline and thyroid not enlarged, symmetric, no tenderness/mass/nodules Lungs: clear to auscultation bilaterally Heart: irregularly irregular rhythm Extremities: 22+ pitting edema bilaterally  EKG not performed today  ASSESSMENT AND PLAN:   Coronary artery disease Status post cardiac catheterization performed 1/26/9 by Dr. Allena Napoleon revealing noncritical CAD with at most 50-60% stenosis in the RCA with normal LV systolic function. He had a Myoview stress test performed in July for preoperative clearance before his left total knee replacement which was normal. He denies chest pain or changes in his breathing pattern.  Essential hypertension Well-controlled on current medications  Hyperlipidemia On statin therapy followed by his PCP  Chronic atrial flutter Rate controlled on Xarelto anticoagulation      Runell Gess MD [facility], [facility] 12/13/2012 2:10 PM

## 2012-12-13 NOTE — Assessment & Plan Note (Signed)
Rate controlled on Xarelto anticoagulation 

## 2012-12-13 NOTE — Patient Instructions (Signed)
Your physician wants you to follow-up in: 6 months with [facility] and 1 year with Dr Allyson Sabal.  You will receive a reminder letter in the mail two months in advance. If you don't receive a letter, please call our office to schedule the follow-up appointment.   Dr Allyson Sabal would like for you to wear compression hose during the day time hours only.

## 2012-12-13 NOTE — Assessment & Plan Note (Signed)
Status post cardiac catheterization performed 1/26/9 by Dr. Allena Napoleon revealing noncritical CAD with at most 50-60% stenosis in the RCA with normal LV systolic function. He had a Myoview stress test performed in July for preoperative clearance before his left total knee replacement which was normal. He denies chest pain or changes in his breathing pattern.

## 2012-12-13 NOTE — Assessment & Plan Note (Signed)
On statin therapy followed by his PCP 

## 2012-12-13 NOTE — Assessment & Plan Note (Signed)
Well-controlled on current medications 

## 2013-01-20 ENCOUNTER — Emergency Department (HOSPITAL_COMMUNITY): Payer: Medicare Other

## 2013-01-20 ENCOUNTER — Encounter (HOSPITAL_COMMUNITY): Payer: Self-pay | Admitting: *Deleted

## 2013-01-20 ENCOUNTER — Inpatient Hospital Stay (HOSPITAL_COMMUNITY)
Admission: EM | Admit: 2013-01-20 | Discharge: 2013-01-27 | DRG: 480 | Disposition: A | Discharge status: Skilled Nursing Facility | Payer: Medicare Other | Attending: Internal Medicine | Admitting: Internal Medicine

## 2013-01-20 DIAGNOSIS — S8010XA Contusion of unspecified lower leg, initial encounter: Secondary | ICD-10-CM | POA: Diagnosis present

## 2013-01-20 DIAGNOSIS — Z87891 Personal history of nicotine dependence: Secondary | ICD-10-CM

## 2013-01-20 DIAGNOSIS — E871 Hypo-osmolality and hyponatremia: Secondary | ICD-10-CM | POA: Diagnosis present

## 2013-01-20 DIAGNOSIS — S8012XA Contusion of left lower leg, initial encounter: Secondary | ICD-10-CM | POA: Diagnosis present

## 2013-01-20 DIAGNOSIS — I4892 Unspecified atrial flutter: Secondary | ICD-10-CM | POA: Diagnosis present

## 2013-01-20 DIAGNOSIS — D62 Acute posthemorrhagic anemia: Secondary | ICD-10-CM | POA: Diagnosis not present

## 2013-01-20 DIAGNOSIS — I714 Abdominal aortic aneurysm, without rupture: Secondary | ICD-10-CM

## 2013-01-20 DIAGNOSIS — E785 Hyperlipidemia, unspecified: Secondary | ICD-10-CM | POA: Diagnosis present

## 2013-01-20 DIAGNOSIS — E876 Hypokalemia: Secondary | ICD-10-CM | POA: Diagnosis not present

## 2013-01-20 DIAGNOSIS — I639 Cerebral infarction, unspecified: Secondary | ICD-10-CM

## 2013-01-20 DIAGNOSIS — J811 Chronic pulmonary edema: Secondary | ICD-10-CM | POA: Diagnosis present

## 2013-01-20 DIAGNOSIS — I4891 Unspecified atrial fibrillation: Secondary | ICD-10-CM | POA: Diagnosis present

## 2013-01-20 DIAGNOSIS — S72402A Unspecified fracture of lower end of left femur, initial encounter for closed fracture: Secondary | ICD-10-CM

## 2013-01-20 DIAGNOSIS — J4489 Other specified chronic obstructive pulmonary disease: Secondary | ICD-10-CM | POA: Diagnosis present

## 2013-01-20 DIAGNOSIS — J449 Chronic obstructive pulmonary disease, unspecified: Secondary | ICD-10-CM | POA: Diagnosis present

## 2013-01-20 DIAGNOSIS — E669 Obesity, unspecified: Secondary | ICD-10-CM | POA: Diagnosis present

## 2013-01-20 DIAGNOSIS — Z96659 Presence of unspecified artificial knee joint: Secondary | ICD-10-CM

## 2013-01-20 DIAGNOSIS — E039 Hypothyroidism, unspecified: Secondary | ICD-10-CM | POA: Diagnosis present

## 2013-01-20 DIAGNOSIS — S72409A Unspecified fracture of lower end of unspecified femur, initial encounter for closed fracture: Principal | ICD-10-CM

## 2013-01-20 DIAGNOSIS — I959 Hypotension, unspecified: Secondary | ICD-10-CM | POA: Diagnosis present

## 2013-01-20 DIAGNOSIS — T502X5A Adverse effect of carbonic-anhydrase inhibitors, benzothiadiazides and other diuretics, initial encounter: Secondary | ICD-10-CM | POA: Diagnosis present

## 2013-01-20 DIAGNOSIS — I10 Essential (primary) hypertension: Secondary | ICD-10-CM | POA: Diagnosis present

## 2013-01-20 DIAGNOSIS — Z96649 Presence of unspecified artificial hip joint: Secondary | ICD-10-CM

## 2013-01-20 DIAGNOSIS — Z7901 Long term (current) use of anticoagulants: Secondary | ICD-10-CM

## 2013-01-20 DIAGNOSIS — G9340 Encephalopathy, unspecified: Secondary | ICD-10-CM | POA: Diagnosis present

## 2013-01-20 DIAGNOSIS — W010XXA Fall on same level from slipping, tripping and stumbling without subsequent striking against object, initial encounter: Secondary | ICD-10-CM | POA: Diagnosis present

## 2013-01-20 DIAGNOSIS — I2510 Atherosclerotic heart disease of native coronary artery without angina pectoris: Secondary | ICD-10-CM | POA: Diagnosis present

## 2013-01-20 LAB — CBC WITH DIFFERENTIAL/PLATELET
Eosinophils Relative: 2 % (ref 0–5)
HCT: 35.9 % — ABNORMAL LOW (ref 39.0–52.0)
Lymphocytes Relative: 30 % (ref 12–46)
Lymphs Abs: 2 10*3/uL (ref 0.7–4.0)
MCV: 93.2 fL (ref 78.0–100.0)
Monocytes Absolute: 0.6 10*3/uL (ref 0.1–1.0)
Monocytes Relative: 8 % (ref 3–12)
RBC: 3.85 MIL/uL — ABNORMAL LOW (ref 4.22–5.81)
WBC: 6.8 10*3/uL (ref 4.0–10.5)

## 2013-01-20 LAB — COMPREHENSIVE METABOLIC PANEL
ALT: 10 U/L (ref 0–53)
CO2: 23 mEq/L (ref 19–32)
Calcium: 8.5 mg/dL (ref 8.4–10.5)
Creatinine, Ser: 0.86 mg/dL (ref 0.50–1.35)
GFR calc Af Amer: 90 mL/min — ABNORMAL LOW (ref >90)
GFR calc non Af Amer: 77 mL/min — ABNORMAL LOW (ref >90)
Glucose, Bld: 133 mg/dL — ABNORMAL HIGH (ref 70–99)

## 2013-01-20 LAB — MRSA PCR SCREENING: MRSA by PCR: NEGATIVE

## 2013-01-20 LAB — PROTIME-INR
INR: 2.5 — ABNORMAL HIGH (ref 0.00–1.49)
INR: 1.68 — ABNORMAL HIGH (ref 0.00–1.49)
Prothrombin Time: 19.3 seconds — ABNORMAL HIGH (ref 11.6–15.2)

## 2013-01-20 MED ORDER — FENTANYL CITRATE 0.05 MG/ML IJ SOLN
50.0000 ug | Freq: Once | INTRAMUSCULAR | Status: AC
  Administered 2013-01-20: 50 ug via INTRAVENOUS
  Filled 2013-01-20: qty 2

## 2013-01-20 MED ORDER — METOPROLOL SUCCINATE ER 25 MG PO TB24
25.0000 mg | ORAL_TABLET | Freq: Every morning | ORAL | Status: DC
  Administered 2013-01-20: 25 mg via ORAL
  Filled 2013-01-20 (×2): qty 1

## 2013-01-20 MED ORDER — FERROUS SULFATE 325 (65 FE) MG PO TABS
325.0000 mg | ORAL_TABLET | Freq: Three times a day (TID) | ORAL | Status: DC
  Administered 2013-01-20 – 2013-01-27 (×18): 325 mg via ORAL
  Filled 2013-01-20 (×26): qty 1

## 2013-01-20 MED ORDER — OXYCODONE HCL 5 MG PO TABS
10.0000 mg | ORAL_TABLET | ORAL | Status: DC | PRN
  Administered 2013-01-20 – 2013-01-22 (×10): 10 mg via ORAL
  Filled 2013-01-20 (×10): qty 2

## 2013-01-20 MED ORDER — SIMVASTATIN 20 MG PO TABS
20.0000 mg | ORAL_TABLET | Freq: Every day | ORAL | Status: DC
  Administered 2013-01-20 – 2013-01-26 (×6): 20 mg via ORAL
  Filled 2013-01-20 (×9): qty 1

## 2013-01-20 MED ORDER — ASPIRIN 81 MG PO TABS: 81.0000 mg | ORAL_TABLET | Freq: Every day | ORAL | Status: DC

## 2013-01-20 MED ORDER — LEVOTHYROXINE SODIUM 75 MCG PO TABS
75.0000 ug | ORAL_TABLET | Freq: Every day | ORAL | Status: DC
  Administered 2013-01-20 – 2013-01-27 (×8): 75 ug via ORAL
  Filled 2013-01-20 (×11): qty 1

## 2013-01-20 MED ORDER — HYDROMORPHONE HCL PF 1 MG/ML IJ SOLN
1.0000 mg | Freq: Once | INTRAMUSCULAR | Status: DC
  Filled 2013-01-20: qty 1

## 2013-01-20 MED ORDER — ADULT MULTIVITAMIN W/MINERALS CH
1.0000 | ORAL_TABLET | Freq: Every day | ORAL | Status: DC
  Administered 2013-01-20 – 2013-01-27 (×7): 1 via ORAL
  Filled 2013-01-20 (×8): qty 1

## 2013-01-20 MED ORDER — HYDROMORPHONE HCL PF 1 MG/ML IJ SOLN
1.0000 mg | INTRAMUSCULAR | Status: DC | PRN
  Administered 2013-01-20 – 2013-01-22 (×9): 1 mg via INTRAVENOUS
  Filled 2013-01-20 (×9): qty 1

## 2013-01-20 MED ORDER — PROTHROMBIN COMPLEX CONC HUMAN 500 UNITS IV KIT
4939.0000 [IU] | PACK | Freq: Once | Status: AC
  Administered 2013-01-20: 4939 [IU] via INTRAVENOUS
  Filled 2013-01-20: qty 198

## 2013-01-20 MED ORDER — EMPTY CONTAINERS FLEXIBLE MISC
5491.0000 [IU] | Status: DC
  Filled 2013-01-20: qty 220

## 2013-01-20 MED ORDER — FENTANYL CITRATE 0.05 MG/ML IJ SOLN
50.0000 ug | Freq: Once | INTRAMUSCULAR | Status: AC
  Administered 2013-01-20: 50 ug via INTRAVENOUS

## 2013-01-20 MED ORDER — LOSARTAN POTASSIUM 50 MG PO TABS
50.0000 mg | ORAL_TABLET | Freq: Every morning | ORAL | Status: DC
  Administered 2013-01-20 – 2013-01-24 (×5): 50 mg via ORAL
  Filled 2013-01-20 (×6): qty 1

## 2013-01-20 MED ORDER — ASPIRIN 81 MG PO CHEW
81.0000 mg | CHEWABLE_TABLET | Freq: Every day | ORAL | Status: DC
  Administered 2013-01-20 – 2013-01-26 (×6): 81 mg via ORAL
  Filled 2013-01-20 (×7): qty 1

## 2013-01-20 MED ORDER — HYDROMORPHONE HCL PF 1 MG/ML IJ SOLN
1.0000 mg | Freq: Once | INTRAMUSCULAR | Status: AC
  Administered 2013-01-20: 1 mg via INTRAVENOUS
  Filled 2013-01-20: qty 1

## 2013-01-20 NOTE — ED Notes (Signed)
Per EMS - pt from home, pt lives with his daughter, pt c/o dizziness tonight and caused pt to fall, pt unsure of LOC. Pt w/ left hip pain and external rotation to left lower extremitiy, no deformity palpated by EMS. Pt given of fentanyl en route by EMS. Pt w/ x2 hip replacements to left hip, last one x6 weeks ago - pt also noted to be in atrial fibrillation at a rate approx 130-140bpm.

## 2013-01-20 NOTE — ED Notes (Signed)
Family at bedside. 

## 2013-01-20 NOTE — Progress Notes (Signed)
Orthopedic Tech Progress Note Patient Details:  Austin [name] 05-28-1926 [id number]  Musculoskeletal Traction Type of Traction: Bucks Skin Traction Traction Location: L leg  Traction Weight: 20 lbs    Haskell Flirt 01/20/2013, 5:46 AM

## 2013-01-20 NOTE — Progress Notes (Signed)
Subjective: Pt admitted to hospital for left femur fracture s/p fall yesterday 01/19/2013.  Pt doing well this AM, states pain is well tolerated and he has no change to appetite.  Pt has surgical hx of left THA and left TKA. Pt denies N/V/F/C, SOB, chest pain, calf pain, or paresthesias bilaterally.   Objective: Vital signs in last 24 hours: Temp:  [97.6 F (36.4 C)-98.3 F (36.8 C)] 98.3 F (36.8 C) (10/04 0541) Pulse Rate:  [64-121] 98 (10/04 0541) Resp:  [16-23] 19 (10/04 0541) BP: (117-144)/(58-94) 140/94 mmHg (10/04 0541) SpO2:  [95 %-99 %] 96 % (10/04 0541) Weight:  [130.6 kg (287 lb 14.7 oz)] 130.6 kg (287 lb 14.7 oz) (10/04 0541)  Intake/Output from previous day:   Intake/Output this shift:     Recent Labs  01/20/13 0049  HGB 11.8*    Recent Labs  01/20/13 0049  WBC 6.8  RBC 3.85*  HCT 35.9*  PLT 173    Recent Labs  01/20/13 0049  NA 138  K 4.3  CL 102  CO2 23  BUN 15  CREATININE 0.86  GLUCOSE 133*  CALCIUM 8.5    Recent Labs  01/20/13 0049 01/20/13 0815  INR 2.50* 1.68*    WD WN male in NAD, A/Ox3, appears stated age.  EOMI, respirations unlabored, mood and affect are normal.  Left thigh is edematous with tight skin noted anteriorly.  No erythema or tenting noted at fracture site.  Lower extremities are NVI with good motor control of plantar/dorsi flexion of great toes and feet bilaterally.  Sensibility to light touch is normal bilaterally.  Cap refill at distal extremity is <2 sec.  Dorsalis pedis pulses 2+ bilaterally.  20lbs bucks traction placed on left leg with no complications  Assessment/Plan: I had detailed discussion with pt regarding his injury and surgery to repair fracture.  Pt is currently scheduled for surgery with Dr. Charlann Boxer for ORIF of left femur on Monday 01/22/13.  Pt will continue bucks traction and adequate pain management and report for surgery on Monday.   Lacy Sofia S 01/20/2013, 8:49 AM

## 2013-01-20 NOTE — Progress Notes (Signed)
[facility] PROGRESS NOTE  Austin [name]:[id number] DOB: [birth year] DOA: 01/20/2013 PCP: Ailene Ravel, MD  Assessment/Plan: Principal Problem:   Closed fracture of left distal femur: Pain controlled. Orthopedist to decide when and what treatment indicated Active Problems:   Chronic atrial flutter: Currently rate controlled. Holding anticoagulation   Obese: BMI at 31   Coronary artery disease: Stable   Essential hypertension: Stable   Hyperlipidemia: Stable   Traumatic hematoma of left lower leg: Xarelto being reversed   Code Status: Full code  Family Communication: Masses of her family.  Disposition Plan: Suspect likely will need skilled nursing   Consultants:  Noris/Olin-orthopedic surgery  Procedures:  Or so to decide on repair  Antibiotics:  None  HPI/Subjective: Patient was complaining of pain earlier. Significantly improved with oral pain medication when necessary  Objective: Filed Vitals:   01/20/13 0541  BP: 140/94  Pulse: 98  Temp: 98.3 F (36.8 C)  Resp: 19   No intake or output data in the 24 hours ending 01/20/13 0811 Filed Weights   01/20/13 0541  Weight: 130.6 kg (287 lb 14.7 oz)    Exam:   General:  Sleeping comfortably, in no acute distress  Cardiovascular: Irregular rhythm, rate controlled  Respiratory: Clear to auscultation bilaterally  Abdomen: Soft, nontender, nondistended, positive bowel sounds  Musculoskeletal: No clubbing or cyanosis, trace edema, in traction   Data Reviewed: Basic Metabolic Panel:  Recent Labs Lab 01/20/13 0049  NA 138  K 4.3  CL 102  CO2 23  GLUCOSE 133*  BUN 15  CREATININE 0.86  CALCIUM 8.5   Liver Function Tests:  Recent Labs Lab 01/20/13 0049  AST 16  ALT 10  ALKPHOS 125*  BILITOT 0.3  PROT 6.6  ALBUMIN 3.1*   No results found for this basename: LIPASE, AMYLASE,  in the last 168 hours No results found for this basename: AMMONIA,  in the last 168  hours CBC:  Recent Labs Lab 01/20/13 0049  WBC 6.8  NEUTROABS 4.0  HGB 11.8*  HCT 35.9*  MCV 93.2  PLT 173   Cardiac Enzymes:  Recent Labs Lab 01/20/13 0048  TROPONINI <0.30   BNP (last 3 results) No results found for this basename: PROBNP,  in the last 8760 hours CBG: No results found for this basename: GLUCAP,  in the last 168 hours  No results found for this or any previous visit (from the past 240 hour(s)).   Studies: Dg Hip Complete Left  01/20/2013    IMPRESSION: Partially imaged distal left femur fracture, consider dedicated femur radiograph if clinically indicated.  Status post left hip total arthroplasty without radiographic findings of hardware failure.  Severe degenerative change of the right hip.   Original Report Authenticated By: Awilda Metro   Dg Knee 1-2 Views Left  01/20/2013   **ADDENDUM** CREATED: 01/20/2013 02:03:46  Findings:  The frontal radiograph of the knee is obliqued due to patient condition, in frog-leg position.  **END ADDENDUM** SIGNED BY: Courtnay  Bloomer  01/20/2013     IMPRESSION: Comminuted, grossly aligned distal femur fracture extending to the femoral component of patient's total knee arthroplasty.  No dislocation.   Original Report Authenticated By: Awilda Metro    Scheduled Meds: . aspirin  81 mg Oral Daily  . ferrous sulfate  325 mg Oral TID PC  .  HYDROmorphone (DILAUDID) injection  1 mg Intravenous Once  . levothyroxine  75 mcg Oral QAC breakfast  . losartan  50 mg Oral q morning - 10a  .  metoprolol succinate  25 mg Oral q morning - 10a  . multivitamin with minerals  1 tablet Oral Daily  . simvastatin  20 mg Oral q1800   Continuous Infusions:   Principal Problem:   Closed fracture of left distal femur Active Problems:   Chronic atrial flutter   Obese   Coronary artery disease   Essential hypertension   Hyperlipidemia   Traumatic hematoma of left lower leg    Time spent: 20 minutes    Austin [name]  [facility] Pager [phone]. If 7PM-7AM, please contact night-coverage at [email or web address], password [facility] 01/20/2013, 8:11 AM  LOS: 0 days

## 2013-01-20 NOTE — Consult Note (Signed)
Reason for Consult:left femur fracture Referring Physician: EDP  Austin [name] is an 79 y.o. male.  HPI: 79 yo male s/p left THA 6 weeks ago by Dr Charlann Boxer with fall in kitchen this morning.  C/o immediate severe pain.  Unable to stand after fall.  Patient transported to the [facility] ED for eval.  Past Medical History  Diagnosis Date  . Hypertension   . Hyperlipidemia   . AAA (abdominal aortic aneurysm)   . Arthritis   . CAD (coronary artery disease)   . Thyroid disease     hypothyroidism  . Dysrhythmia     atrial flutter  . Shortness of breath     a little with exertion  . Polio 1934  . Atrial flutter     chronic anticoagulation  . COPD (chronic obstructive pulmonary disease)   . History of nuclear stress test 04/2010    dipyridamole; small, mostly fixed basal to mid inferior and inferoseptal defect (gut artifact v. scar); post stress EF 62%; non-diagnostic for ischemia; low risk     Past Surgical History  Procedure Laterality Date  . Abdominal aortic aneurysm repair  05/21/2010    aorto bi-iliac BPG  . Appendectomy  1949  . Popliteal synovial cyst excision Left   . Joint replacement      Knee and Hip replacements  . Total hip arthroplasty Left 1994  . Knee arthroplasty Left 2004  . Total hip revision Left 12/04/2012    Procedure: LEFT TOTAL HIP REVISION;  Surgeon: Shelda Pal, MD;  Location: [facility];  Service: Orthopedics;  Laterality: Left;  . Cardiac catheterization  2009    in [place]; 50-60% RCA  . Transthoracic echocardiogram  04/2010    EF =>55%; RV borderline dilated; LA mildly dilated; mild mitral annular calcif & mild MR; mild TR; mild calcif of AV leaflets with mild AV stenosis; aortic root sclerosis/calcif    Family History  Problem Relation Age of Onset  . Other Father     varicose veins  . Hyperlipidemia Son   . Hypertension Son   . AAA (abdominal aortic aneurysm) Son   . Lung cancer Brother     Social History:  reports that he quit smoking about 46 years  ago. His smoking use included Cigarettes. He has a 25 pack-year smoking history. He has never used smokeless tobacco. He reports that  drinks alcohol. He reports that he does not use illicit drugs.  Allergies: No Known Allergies  Medications: I have reviewed the patient's current medications.  Results for orders placed during the hospital encounter of 01/20/13 (from the past 48 hour(s))  TROPONIN I     Status: None   Collection Time    01/20/13 12:48 AM      Result Value Range   Troponin I <0.30  <0.30 ng/mL   Comment:            Due to the release kinetics of cTnI,     a negative result within the first hours     of the onset of symptoms does not rule out     myocardial infarction with certainty.     If myocardial infarction is still suspected,     repeat the test at appropriate intervals.  CBC WITH DIFFERENTIAL     Status: Abnormal   Collection Time    01/20/13 12:49 AM      Result Value Range   WBC 6.8  4.0 - 10.5 K/uL   RBC 3.85 (*) 4.22 -  5.81 MIL/uL   Hemoglobin 11.8 (*) 13.0 - 17.0 g/dL   HCT 45.4 (*) 09.8 - 11.9 %   MCV 93.2  78.0 - 100.0 fL   MCH 30.6  26.0 - 34.0 pg   MCHC 32.9  30.0 - 36.0 g/dL   RDW 14.7  82.9 - 56.2 %   Platelets 173  150 - 400 K/uL   Neutrophils Relative % 59  43 - 77 %   Neutro Abs 4.0  1.7 - 7.7 K/uL   Lymphocytes Relative 30  12 - 46 %   Lymphs Abs 2.0  0.7 - 4.0 K/uL   Monocytes Relative 8  3 - 12 %   Monocytes Absolute 0.6  0.1 - 1.0 K/uL   Eosinophils Relative 2  0 - 5 %   Eosinophils Absolute 0.2  0.0 - 0.7 K/uL   Basophils Relative 1  0 - 1 %   Basophils Absolute 0.0  0.0 - 0.1 K/uL  COMPREHENSIVE METABOLIC PANEL     Status: Abnormal   Collection Time    01/20/13 12:49 AM      Result Value Range   Sodium 138  135 - 145 mEq/L   Potassium 4.3  3.5 - 5.1 mEq/L   Chloride 102  96 - 112 mEq/L   CO2 23  19 - 32 mEq/L   Glucose, Bld 133 (*) 70 - 99 mg/dL   BUN 15  6 - 23 mg/dL   Creatinine, Ser 1.30  0.50 - 1.35 mg/dL   Calcium 8.5   8.4 - 86.5 mg/dL   Total Protein 6.6  6.0 - 8.3 g/dL   Albumin 3.1 (*) 3.5 - 5.2 g/dL   AST 16  0 - 37 U/L   ALT 10  0 - 53 U/L   Alkaline Phosphatase 125 (*) 39 - 117 U/L   Total Bilirubin 0.3  0.3 - 1.2 mg/dL   GFR calc non Af Amer 77 (*) >90 mL/min   GFR calc Af Amer 90 (*) >90 mL/min   Comment: (NOTE)     The eGFR has been calculated using the CKD EPI equation.     This calculation has not been validated in all clinical situations.     eGFR's persistently <90 mL/min signify possible Chronic Kidney     Disease.  PROTIME-INR     Status: Abnormal   Collection Time    01/20/13 12:49 AM      Result Value Range   Prothrombin Time 26.2 (*) 11.6 - 15.2 seconds   INR 2.50 (*) 0.00 - 1.49  APTT     Status: Abnormal   Collection Time    01/20/13 12:49 AM      Result Value Range   aPTT 47 (*) 24 - 37 seconds   Comment:            IF BASELINE aPTT IS ELEVATED,     SUGGEST PATIENT RISK ASSESSMENT     BE USED TO DETERMINE APPROPRIATE     ANTICOAGULANT THERAPY.    Dg Hip Complete Left  01/20/2013   *RADIOLOGY REPORT*  Clinical Data: Left hip pain, fall.  LEFT HIP - COMPLETE 2+ VIEW  Comparison: None available at time of study interpretation.  Findings: Frontal view of the pelvis and three images of the left hip. Status post left hip total arthroplasty, with intact well seated components. Included view of the distal femur, distal to the femoral stem demonstrates a partially imaged comminuted fracture.  Severe right hip osteoarthrosis.  Phleboliths in the pelvis. Severe vascular calcifications.  Surgical clips in the right inguinal region.  Moderate degenerative change of the lumbar spine.  IMPRESSION: Partially imaged distal left femur fracture, consider dedicated femur radiograph if clinically indicated.  Status post left hip total arthroplasty without radiographic findings of hardware failure.  Severe degenerative change of the right hip.   Original Report Authenticated By: Awilda Metro    Dg Knee 1-2 Views Left  01/20/2013   **ADDENDUM** CREATED: 01/20/2013 02:03:46  Findings:  The frontal radiograph of the knee is obliqued due to patient condition, in frog-leg position.  **END ADDENDUM** SIGNED BY: Courtnay  Bloomer  01/20/2013   *RADIOLOGY REPORT*  Clinical Data: Pain, fall  LEFT KNEE - 1-2 VIEW  Comparison: None available at time of study interpretation.  Findings: Frontal radiograph.  Comminuted mildly impacted distal femur fracture, immediately proximal to the femoral component of patient's total knee arthroplasty.  No periprosthetic lucency.  Fracture fragments are in gross alignment.  No dislocation.  No destructive bony lesions.  Moderate vascular calcifications. Small suprapatellar joint effusion.  IMPRESSION: Comminuted, grossly aligned distal femur fracture extending to the femoral component of patient's total knee arthroplasty.  No dislocation.   Original Report Authenticated By: Pernell Dupre Bloomer    ROS Blood pressure 128/71, pulse 101, temperature 97.6 F (36.4 C), temperature source Oral, resp. rate 20, SpO2 95.00%. Physical Exam neck: nontender with normal ROM, chest nontender to compression, bilateral UEs with full ROM, right LE with pain free full ROM, left LE with external rotation deformity, able to move ankle without pain.  Bilateral pitting edema in both feet, pulses intact  Assessment/Plan: Left femur periprosthetic fx.. Patient on Xarelto and INR is elevated (2.5)  Will need to reverse Xarelto. Pharmacy consulted on this.  Medicine admitting for several medical comorbidities.  Thanks. I will discuss with Dr Charlann Boxer, his surgeon, this morning regarding definitive care. Plan to follow.  Bucks traction once in regular hospital bed - ordered.  Aimee Timmons,STEVEN R 01/20/2013, 3:25 AM

## 2013-01-20 NOTE — Progress Notes (Signed)
INITIAL NUTRITION ASSESSMENT  DOCUMENTATION CODES Per approved criteria  -Obesity Unspecified   INTERVENTION: 1. Diet advance as medically able.  2. RD will continue to follow  NUTRITION DIAGNOSIS: Inadequate oral intake related to limited diet as evidenced by Clear Liquids.   Goal: PO intake to meet >/=90% estimated nutrition needs.   Monitor:  PO intake, diet advance, weight trends, labs   Reason for Assessment: Consult- hip fracture protocol  79 y.o. male  Admitting Dx: Closed fracture of left distal femur  ASSESSMENT: Pt with recent L hip replacement (6 weeks ago) fell at home resulting in distal femur fracture, but intact hip replacement. Planned for surgical interventions. Pt reports on Monday.   Pt states he has had a good appetite and maybe 6 lb weight loss in the past month. Weight hx shows weight gain in the past 2 months.  Ate about 100% of clear liquid breakfast.   Height: Ht Readings from Last 1 Encounters:  01/20/13 6\' 5"  (1.956 m)    Weight: Wt Readings from Last 1 Encounters:  01/20/13 287 lb 14.7 oz (130.6 kg)    Ideal Body Weight: 208 lbs  % Ideal Body Weight: 138%  Wt Readings from Last 10 Encounters:  01/20/13 287 lb 14.7 oz (130.6 kg)  12/13/12 280 lb (127.007 kg)  12/06/12 314 lb 8 oz (142.656 kg)  12/06/12 314 lb 8 oz (142.656 kg)  11/24/12 287 lb (130.182 kg)  11/15/12 289 lb (131.09 kg)  10/23/12 289 lb (131.09 kg)    Usual Body Weight: 287-289 lbs   % Usual Body Weight: 100%  BMI:  Body mass index is 34.14 kg/(m^2). Obesity class 1   Estimated Nutritional Needs: Kcal: 2200-2400 Protein: 115-130 gm  Fluid: 2.2-2.4 L   Skin: intact  Diet Order: Clear Liquid  EDUCATION NEEDS: -No education needs identified at this time  No intake or output data in the 24 hours ending 01/20/13 0821  Last BM: PTA   Labs:   Recent Labs Lab 01/20/13 0049  NA 138  K 4.3  CL 102  CO2 23  BUN 15  CREATININE 0.86  CALCIUM 8.5   GLUCOSE 133*    CBG (last 3)  No results found for this basename: GLUCAP,  in the last 72 hours  Scheduled Meds: . aspirin  81 mg Oral Daily  . ferrous sulfate  325 mg Oral TID PC  .  HYDROmorphone (DILAUDID) injection  1 mg Intravenous Once  . levothyroxine  75 mcg Oral QAC breakfast  . losartan  50 mg Oral q morning - 10a  . metoprolol succinate  25 mg Oral q morning - 10a  . multivitamin with minerals  1 tablet Oral Daily  . simvastatin  20 mg Oral q1800    Continuous Infusions:   Past Medical History  Diagnosis Date  . Hypertension   . Hyperlipidemia   . AAA (abdominal aortic aneurysm)   . Arthritis   . CAD (coronary artery disease)   . Thyroid disease     hypothyroidism  . Dysrhythmia     atrial flutter  . Shortness of breath     a little with exertion  . Polio 1934  . Atrial flutter     chronic anticoagulation  . COPD (chronic obstructive pulmonary disease)   . History of nuclear stress test 04/2010    dipyridamole; small, mostly fixed basal to mid inferior and inferoseptal defect (gut artifact v. scar); post stress EF 62%; non-diagnostic for ischemia; low risk  Past Surgical History  Procedure Laterality Date  . Abdominal aortic aneurysm repair  05/21/2010    aorto bi-iliac BPG  . Appendectomy  1949  . Popliteal synovial cyst excision Left   . Joint replacement      Knee and Hip replacements  . Total hip arthroplasty Left 1994  . Knee arthroplasty Left 2004  . Total hip revision Left 12/04/2012    Procedure: LEFT TOTAL HIP REVISION;  Surgeon: Shelda Pal, MD;  Location: [facility];  Service: Orthopedics;  Laterality: Left;  . Cardiac catheterization  2009    in [place]; 50-60% RCA  . Transthoracic echocardiogram  04/2010    EF =>55%; RV borderline dilated; LA mildly dilated; mild mitral annular calcif & mild MR; mild TR; mild calcif of AV leaflets with mild AV stenosis; aortic root sclerosis/calcif    Isabell Jarvis RD, LDN Pager [phone] After Hours pager  [phone]

## 2013-01-20 NOTE — H&P (Signed)
[facility] History and Physical  Austin [name] ZOX:[id number] DOB: Nov 14, 1926 DOA: 01/20/2013  Referring physician: ED PCP: Ailene Ravel, MD   Chief Complaint: Fall, leg pain  HPI: Austin [name] is a 79 y.o. male who just had a L hip replacement 6 weeks ago (also has h/o L knee replacement 9 years ago), who unfortunately suffered a fall when his leg gave out on him (no LOC).  Unfortunately he landed on his L leg.  Leg was grossly deformed and movement very painful, (pain worse with movement, better with rest).  In the ED, his distal femur was confirmed to be broken, the hip and knee replacements appear to be intact.  He is also developing a hematoma in that leg (likely secondary to Xarelto he takes for his A.Fib), ortho consulted.  Review of Systems: 12 systems reviewed and otherwise negative.  Past Medical History  Diagnosis Date  . Hypertension   . Hyperlipidemia   . AAA (abdominal aortic aneurysm)   . Arthritis   . CAD (coronary artery disease)   . Thyroid disease     hypothyroidism  . Dysrhythmia     atrial flutter  . Shortness of breath     a little with exertion  . Polio 1934  . Atrial flutter     chronic anticoagulation  . COPD (chronic obstructive pulmonary disease)   . History of nuclear stress test 04/2010    dipyridamole; small, mostly fixed basal to mid inferior and inferoseptal defect (gut artifact v. scar); post stress EF 62%; non-diagnostic for ischemia; low risk    Past Surgical History  Procedure Laterality Date  . Abdominal aortic aneurysm repair  05/21/2010    aorto bi-iliac BPG  . Appendectomy  1949  . Popliteal synovial cyst excision Left   . Joint replacement      Knee and Hip replacements  . Total hip arthroplasty Left 1994  . Knee arthroplasty Left 2004  . Total hip revision Left 12/04/2012    Procedure: LEFT TOTAL HIP REVISION;  Surgeon: Shelda Pal, MD;  Location: [facility];  Service: Orthopedics;  Laterality: Left;  .  Cardiac catheterization  2009    in [place]; 50-60% RCA  . Transthoracic echocardiogram  04/2010    EF =>55%; RV borderline dilated; LA mildly dilated; mild mitral annular calcif & mild MR; mild TR; mild calcif of AV leaflets with mild AV stenosis; aortic root sclerosis/calcif   Social History:  reports that he quit smoking about 46 years ago. His smoking use included Cigarettes. He has a 25 pack-year smoking history. He has never used smokeless tobacco. He reports that  drinks alcohol. He reports that he does not use illicit drugs.  No Known Allergies  Family History  Problem Relation Age of Onset  . Other Father     varicose veins  . Hyperlipidemia Son   . Hypertension Son   . AAA (abdominal aortic aneurysm) Son   . Lung cancer Brother      Prior to Admission medications   Medication Sig Start Date End Date Taking? Authorizing Provider  aspirin 81 MG tablet Take 81 mg by mouth daily.   Yes Historical Provider, MD  cholecalciferol (VITAMIN D) 1000 UNITS tablet Take 1,000 Units by mouth daily.   Yes Historical Provider, MD  ferrous sulfate 325 (65 FE) MG tablet Take 1 tablet (325 mg total) by mouth 3 (three) times daily after meals. 12/08/12  Yes Genelle Gather Babish, PA-C  levothyroxine (SYNTHROID, LEVOTHROID) 75 MCG tablet  Take 75 mcg by mouth daily before breakfast.   Yes Historical Provider, MD  losartan (COZAAR) 50 MG tablet Take 50 mg by mouth every morning.    Yes Historical Provider, MD  lovastatin (MEVACOR) 40 MG tablet Take 40 mg by mouth every morning.    Yes Historical Provider, MD  metoprolol succinate (TOPROL-XL) 25 MG 24 hr tablet Take 25 mg by mouth every morning.   Yes Historical Provider, MD  Multiple Vitamin (MULTIVITAMIN WITH MINERALS) TABS tablet Take 1 tablet by mouth daily.   Yes Historical Provider, MD  Rivaroxaban (XARELTO) 20 MG TABS Take 20 mg by mouth daily.   Yes Historical Provider, MD  trolamine salicylate (ASPERCREME) 10 % cream Apply 1 application topically as  needed (for muscle pain).   Yes Historical Provider, MD  zolpidem (AMBIEN) 10 MG tablet Take 10 mg by mouth at bedtime as needed for sleep.   Yes Historical Provider, MD   Physical Exam: Filed Vitals:   01/20/13 0359  BP:   Pulse: 102  Temp:   Resp: 19    General:  NAD, resting comfortably in bed Eyes: PEERLA EOMI ENT: mucous membranes moist Neck: supple w/o JVD Cardiovascular: RRR w/o MRG Respiratory: CTA B Abdomen: soft, nt, nd, bs+ Skin: no rash nor lesion Musculoskeletal: deformity, hematoma on the LLE, distal pulses intact, no evidence of compartment syndrome at this point. Psychiatric: normal tone and affect Neurologic: AAOx3, grossly non-focal  Labs on Admission:  Basic Metabolic Panel:  Recent Labs Lab 01/20/13 0049  NA 138  K 4.3  CL 102  CO2 23  GLUCOSE 133*  BUN 15  CREATININE 0.86  CALCIUM 8.5   Liver Function Tests:  Recent Labs Lab 01/20/13 0049  AST 16  ALT 10  ALKPHOS 125*  BILITOT 0.3  PROT 6.6  ALBUMIN 3.1*   No results found for this basename: LIPASE, AMYLASE,  in the last 168 hours No results found for this basename: AMMONIA,  in the last 168 hours CBC:  Recent Labs Lab 01/20/13 0049  WBC 6.8  NEUTROABS 4.0  HGB 11.8*  HCT 35.9*  MCV 93.2  PLT 173   Cardiac Enzymes:  Recent Labs Lab 01/20/13 0048  TROPONINI <0.30    BNP (last 3 results) No results found for this basename: PROBNP,  in the last 8760 hours CBG: No results found for this basename: GLUCAP,  in the last 168 hours  Radiological Exams on Admission: Dg Hip Complete Left  01/20/2013   *RADIOLOGY REPORT*  Clinical Data: Left hip pain, fall.  LEFT HIP - COMPLETE 2+ VIEW  Comparison: None available at time of study interpretation.  Findings: Frontal view of the pelvis and three images of the left hip. Status post left hip total arthroplasty, with intact well seated components. Included view of the distal femur, distal to the femoral stem demonstrates a partially  imaged comminuted fracture.  Severe right hip osteoarthrosis.  Phleboliths in the pelvis. Severe vascular calcifications.  Surgical clips in the right inguinal region.  Moderate degenerative change of the lumbar spine.  IMPRESSION: Partially imaged distal left femur fracture, consider dedicated femur radiograph if clinically indicated.  Status post left hip total arthroplasty without radiographic findings of hardware failure.  Severe degenerative change of the right hip.   Original Report Authenticated By: Awilda Metro   Dg Knee 1-2 Views Left  01/20/2013   **ADDENDUM** CREATED: 01/20/2013 02:03:46  Findings:  The frontal radiograph of the knee is obliqued due to patient condition, in frog-leg  position.  **END ADDENDUM** SIGNED BY: Courtnay  Bloomer  01/20/2013   *RADIOLOGY REPORT*  Clinical Data: Pain, fall  LEFT KNEE - 1-2 VIEW  Comparison: None available at time of study interpretation.  Findings: Frontal radiograph.  Comminuted mildly impacted distal femur fracture, immediately proximal to the femoral component of patient's total knee arthroplasty.  No periprosthetic lucency.  Fracture fragments are in gross alignment.  No dislocation.  No destructive bony lesions.  Moderate vascular calcifications. Small suprapatellar joint effusion.  IMPRESSION: Comminuted, grossly aligned distal femur fracture extending to the femoral component of patient's total knee arthroplasty.  No dislocation.   Original Report Authenticated By: Awilda Metro    EKG: Independently reviewed.  Assessment/Plan Principal Problem:   Closed fracture of left distal femur Active Problems:   Chronic atrial flutter   1. Closed fracture of left distal femur - ortho is at bedside, patient to OR sometime in near future but for now ortho says can have a clear liquid diet.  Reversing xarelto due to hematoma and intent for surgery, decision made by ED, ortho, and myself under PharmD guidance to use the Kcentra protocol for rapid  reversal due to the large hematoma which is forming. 2. Chronic A.Fib - on tele monitor, continue rate control meds, reversing xarelto    Code Status: Full (must indicate code status--if unknown or must be presumed, indicate so) Family Communication: Family at bedside (indicate person spoken with, if applicable, with phone number if by telephone) Disposition Plan: Admit to inpatient (indicate anticipated LOS)  Time spent: 70 min  Calyssa Zobrist M. [facility] Pager [phone]  If 7PM-7AM, please contact night-coverage [email or web address] Password [email or web address] 01/20/2013, 4:28 AM

## 2013-01-20 NOTE — ED Provider Notes (Signed)
CSN: [id number]     Arrival date & time 01/20/13  0028 History   First MD Initiated Contact with Patient 01/20/13 0034     Chief Complaint  Patient presents with  . Fall  . Hip Pain  . Atrial Fibrillation   (Consider location/radiation/quality/duration/timing/severity/associated sxs/prior Treatment) HPI Patient had his left hip replaced 6 weeks ago by Dr. Charlann Boxer. States that he had any fall from standing tonight because he lost his balance. He denied specifically to me any dizziness, chest pain, shortness of breath. He landed on his left hip. He complains of pain from his left knee to his left hip. He denies any head or neck trauma. Patient denies any numbness or weakness. EMS was called and patient was found to be in atrial fibrillation with a rate in the 130s to 140s. He has known fibrillation and is on Xeralto for anticoagulation. Past Medical History  Diagnosis Date  . Hypertension   . Hyperlipidemia   . AAA (abdominal aortic aneurysm)   . Arthritis   . CAD (coronary artery disease)   . Thyroid disease     hypothyroidism  . Dysrhythmia     atrial flutter  . Shortness of breath     a little with exertion  . Polio 1934  . Atrial flutter     chronic anticoagulation  . COPD (chronic obstructive pulmonary disease)   . History of nuclear stress test 04/2010    dipyridamole; small, mostly fixed basal to mid inferior and inferoseptal defect (gut artifact v. scar); post stress EF 62%; non-diagnostic for ischemia; low risk    Past Surgical History  Procedure Laterality Date  . Abdominal aortic aneurysm repair  05/21/2010    aorto bi-iliac BPG  . Appendectomy  1949  . Popliteal synovial cyst excision Left   . Joint replacement      Knee and Hip replacements  . Total hip arthroplasty Left 1994  . Knee arthroplasty Left 2004  . Total hip revision Left 12/04/2012    Procedure: LEFT TOTAL HIP REVISION;  Surgeon: Shelda Pal, MD;  Location: [facility];  Service: Orthopedics;  Laterality:  Left;  . Cardiac catheterization  2009    in [place]; 50-60% RCA  . Transthoracic echocardiogram  04/2010    EF =>55%; RV borderline dilated; LA mildly dilated; mild mitral annular calcif & mild MR; mild TR; mild calcif of AV leaflets with mild AV stenosis; aortic root sclerosis/calcif   Family History  Problem Relation Age of Onset  . Other Father     varicose veins  . Hyperlipidemia Son   . Hypertension Son   . AAA (abdominal aortic aneurysm) Son   . Lung cancer Brother    History  Substance Use Topics  . Smoking status: Former Smoker -- 1.00 packs/day for 25 years    Types: Cigarettes    Quit date: 04/19/1966  . Smokeless tobacco: Never Used     Comment: 100 pack years per 08/2012 note  . Alcohol Use: 0.0 oz/week     Comment: glass of wine daily, scotch 3 or 4 times a week    Review of Systems  Constitutional: Negative for fever and chills.  HENT: Negative for neck pain.   Respiratory: Negative for cough, shortness of breath and wheezing.   Cardiovascular: Negative for chest pain and leg swelling.  Gastrointestinal: Negative for nausea, vomiting, diarrhea and anal bleeding.  Genitourinary: Negative for dysuria and frequency.  Musculoskeletal: Positive for myalgias and arthralgias. Negative for back pain.  Skin:  Negative for wound.  Neurological: Negative for dizziness, syncope, weakness, light-headedness, numbness and headaches.  All other systems reviewed and are negative.    Allergies  Review of patient's allergies indicates no known allergies.  Home Medications   Current Outpatient Rx  Name  Route  Sig  Dispense  Refill  . aspirin 81 MG tablet   Oral   Take 81 mg by mouth daily.         . cholecalciferol (VITAMIN D) 1000 UNITS tablet   Oral   Take 1,000 Units by mouth daily.         . ferrous sulfate 325 (65 FE) MG tablet   Oral   Take 1 tablet (325 mg total) by mouth 3 (three) times daily after meals.      3   . levothyroxine (SYNTHROID, LEVOTHROID) 75  MCG tablet   Oral   Take 75 mcg by mouth daily before breakfast.         . losartan (COZAAR) 50 MG tablet   Oral   Take 50 mg by mouth every morning.          . lovastatin (MEVACOR) 40 MG tablet   Oral   Take 40 mg by mouth every morning.          . metoprolol succinate (TOPROL-XL) 25 MG 24 hr tablet   Oral   Take 25 mg by mouth every morning.         . Multiple Vitamin (MULTIVITAMIN WITH MINERALS) TABS tablet   Oral   Take 1 tablet by mouth daily.         . Rivaroxaban (XARELTO) 20 MG TABS   Oral   Take 20 mg by mouth daily.         [name] trolamine salicylate (ASPERCREME) 10 % cream   Topical   Apply 1 application topically as needed (for muscle pain).         [name] zolpidem (AMBIEN) 10 MG tablet   Oral   Take 10 mg by mouth at bedtime as needed for sleep.          BP 144/74  Pulse 65  Temp(Src) 97.6 F (36.4 C) (Oral)  Resp 19  SpO2 96% Physical Exam  Nursing note and vitals reviewed. Constitutional: He is oriented to person, place, and time. He appears well-developed and well-nourished. No distress.  HENT:  Head: Normocephalic and atraumatic.  Mouth/Throat: Oropharynx is clear and moist.  Patient has no appreciable head trauma.  Eyes: EOM are normal. Pupils are equal, round, and reactive to light.  Neck: Normal range of motion. Neck supple.  No posterior cervical spine tenderness to palpation.  Cardiovascular: Normal rate and regular rhythm.  Exam reveals no gallop and no friction rub.   No murmur heard. Pulmonary/Chest: Effort normal and breath sounds normal. No respiratory distress. He has no wheezes. He has no rales. He exhibits no tenderness.  Abdominal: Soft. Bowel sounds are normal. He exhibits no distension and no mass. There is no tenderness. There is no rebound and no guarding.  Musculoskeletal: Normal range of motion. He exhibits no edema and no tenderness.  Left lower extremity is shortened and externally rotated. 2+ dorsalis pedis pulses  bilaterally  Neurological: He is alert and oriented to person, place, and time.  5/5 motor in all extremities the left lower extremity is limited due to pain. Sensation is intact.  Skin: Skin is warm and dry. No rash noted. No erythema.  Psychiatric: He has a normal mood  and affect. His behavior is normal.    ED Course  Procedures (including critical care time) Labs Review Labs Reviewed  CBC WITH DIFFERENTIAL - Abnormal; Notable for the following:    RBC 3.85 (*)    Hemoglobin 11.8 (*)    HCT 35.9 (*)    All other components within normal limits  COMPREHENSIVE METABOLIC PANEL - Abnormal; Notable for the following:    Glucose, Bld 133 (*)    Albumin 3.1 (*)    Alkaline Phosphatase 125 (*)    GFR calc non Af Amer 77 (*)    GFR calc Af Amer 90 (*)    All other components within normal limits  PROTIME-INR - Abnormal; Notable for the following:    Prothrombin Time 26.2 (*)    INR 2.50 (*)    All other components within normal limits  APTT - Abnormal; Notable for the following:    aPTT 47 (*)    All other components within normal limits  TROPONIN I  URINALYSIS, ROUTINE W REFLEX MICROSCOPIC   Imaging Review No results found.  Date: 01/20/2013  Rate: 107  Rhythm: atrial flutter  QRS Axis: normal  Intervals: normal  ST/T Wave abnormalities: nonspecific T wave changes  Conduction Disutrbances:none  Narrative Interpretation:   Old EKG Reviewed: unchanged  CRITICAL CARE Performed by: Ranae Palms, Jamylah Marinaccio Total critical care time: 30 min Critical care time was exclusive of separately billable procedures and treating other patients. Critical care was necessary to treat or prevent imminent or life-threatening deterioration. Critical care was time spent personally by me on the following activities: development of treatment plan with patient and/or surrogate as well as nursing, discussions with consultants, evaluation of patient's response to treatment, examination of patient, obtaining  history from patient or surrogate, ordering and performing treatments and interventions, ordering and review of laboratory studies, ordering and review of radiographic studies, pulse oximetry and re-evaluation of patient's condition.  MDM   Patient now has increasing left distal thigh swelling. Discussed with Dr. no worse he will see the patient in the emergency department. Asked to have [facility] admit the patient. Discussed reversal the patient's anticoagulants with his cardiologist who suggested to proceed. Discussed with the pharmacist reversal options. Recommended K-centra. Patient remained stable in the emergency department.     Loren Racer, MD 01/20/13 0630

## 2013-01-21 DIAGNOSIS — D62 Acute posthemorrhagic anemia: Secondary | ICD-10-CM

## 2013-01-21 DIAGNOSIS — I4892 Unspecified atrial flutter: Secondary | ICD-10-CM

## 2013-01-21 DIAGNOSIS — I10 Essential (primary) hypertension: Secondary | ICD-10-CM

## 2013-01-21 LAB — URINALYSIS, ROUTINE W REFLEX MICROSCOPIC
Bilirubin Urine: NEGATIVE
Glucose, UA: NEGATIVE mg/dL
Ketones, ur: NEGATIVE mg/dL
Leukocytes, UA: NEGATIVE
Protein, ur: NEGATIVE mg/dL
Specific Gravity, Urine: 1.029 (ref 1.005–1.030)

## 2013-01-21 LAB — HEPARIN LEVEL (UNFRACTIONATED): Heparin Unfractionated: >2 IU/mL — ABNORMAL HIGH (ref 0.30–0.70)

## 2013-01-21 MED ORDER — METOPROLOL TARTRATE 1 MG/ML IV SOLN
2.5000 mg | Freq: Four times a day (QID) | INTRAVENOUS | Status: DC
  Administered 2013-01-21 – 2013-01-23 (×5): 2.5 mg via INTRAVENOUS
  Filled 2013-01-21 (×15): qty 5

## 2013-01-21 NOTE — Progress Notes (Signed)
Orthopedics Progress Note  Subjective: Stable in the stepdown unit.  HR currently 130.  No pain currently.  Nursing to call IM.  Objective:  Filed Vitals:   01/21/13 0726  BP: 106/47  Pulse: 98  Temp: 98.4 F (36.9 C)  Resp: 13    General: Awake and alert  Musculoskeletal: left leg swollen but no more so than Saturday early morning. No evidence for compartment syndrome.  NVI distally, good ankle pumps. Neurovascularly intact  Lab Results  Component Value Date   WBC 6.8 01/20/2013   HGB 11.8* 01/20/2013   HCT 35.9* 01/20/2013   MCV 93.2 01/20/2013   PLT 173 01/20/2013       Component Value Date/Time   NA 138 01/20/2013 0049   K 4.3 01/20/2013 0049   CL 102 01/20/2013 0049   CO2 23 01/20/2013 0049   GLUCOSE 133* 01/20/2013 0049   BUN 15 01/20/2013 0049   CREATININE 0.86 01/20/2013 0049   CALCIUM 8.5 01/20/2013 0049   GFRNONAA 77* 01/20/2013 0049   GFRAA 90* 01/20/2013 0049    Lab Results  Component Value Date   INR 1.68* 01/20/2013   INR 2.50* 01/20/2013   INR 1.13 12/04/2012    Assessment/Plan:  s/p periprosthetic distal femur fracture left Dr Durene Romans to perform ORIF tomorrow if medically stabilized INR much improved Xarelto off. Medicine to address a flutter Npo after MN  Austin Spare R. Austin Patrick, MD 01/21/2013 11:42 AM

## 2013-01-21 NOTE — Progress Notes (Signed)
Pt tx to 5N per md order, pt VSS, pt tol well, pt verbalized understanding of tx, pt family notified, report called all questions answered

## 2013-01-21 NOTE — Progress Notes (Signed)
[facility] PROGRESS NOTE  GILDO CRISCO ZOX:[id number] DOB: [birth year] DOA: 01/20/2013 PCP: Ailene Ravel, MD  Assessment/Plan: Principal Problem:   Closed fracture of left distal femur: Pain controlled. 4 surgical repair tomorrow Active Problems:   Chronic atrial flutter: Heart rate still elevated. Anticoagulation on hold for pending surgery plus hematoma. I started IV Lopressor   Obese: BMI at 31   Coronary artery disease: Stable   Essential hypertension: Stable   Hyperlipidemia: Stable   Traumatic hematoma of left lower leg: Xarelto being reversed   Code Status: Full code  Family Communication: Spoke with daughter by phone.  Disposition Plan: Suspect likely will need skilled nursing   Consultants:  Noris/Olin-orthopedic surgery  Procedures:  For repair tomorrow  Antibiotics:  None  HPI/Subjective: Pt doing ok, no complaints. No palpitations. Some minimal left leg pain  Objective: Filed Vitals:   01/21/13 0331  BP:   Pulse:   Temp:   Resp: 17    Intake/Output Summary (Last 24 hours) at 01/21/13 0810 Last data filed at 01/21/13 0500  Gross per 24 hour  Intake   1680 ml  Output    900 ml  Net    780 ml   Filed Weights   01/20/13 0541  Weight: 130.6 kg (287 lb 14.7 oz)    Exam:   General:  Sleeping comfortably, in no acute distress  Cardiovascular: Irregular rhythm, rate controlled  Respiratory: Clear to auscultation bilaterally  Abdomen: Soft, nontender, nondistended, positive bowel sounds  Musculoskeletal: No clubbing or cyanosis, trace edema, in traction   Data Reviewed: Basic Metabolic Panel:  Recent Labs Lab 01/20/13 0049  NA 138  K 4.3  CL 102  CO2 23  GLUCOSE 133*  BUN 15  CREATININE 0.86  CALCIUM 8.5   Liver Function Tests:  Recent Labs Lab 01/20/13 0049  AST 16  ALT 10  ALKPHOS 125*  BILITOT 0.3  PROT 6.6  ALBUMIN 3.1*   No results found for this basename: LIPASE, AMYLASE,  in the last 168  hours No results found for this basename: AMMONIA,  in the last 168 hours CBC:  Recent Labs Lab 01/20/13 0049  WBC 6.8  NEUTROABS 4.0  HGB 11.8*  HCT 35.9*  MCV 93.2  PLT 173   Cardiac Enzymes:  Recent Labs Lab 01/20/13 0048  TROPONINI <0.30   BNP (last 3 results) No results found for this basename: PROBNP,  in the last 8760 hours CBG: No results found for this basename: GLUCAP,  in the last 168 hours  Recent Results (from the past 240 hour(s))  MRSA PCR SCREENING     Status: None   Collection Time    01/20/13  6:15 AM      Result Value Range Status   MRSA by PCR NEGATIVE  NEGATIVE Final   Comment:            The GeneXpert MRSA Assay (FDA     approved for NASAL specimens     only), is one component of a     comprehensive MRSA colonization     surveillance program. It is not     intended to diagnose MRSA     infection nor to guide or     monitor treatment for     MRSA infections.     Studies: Dg Hip Complete Left  01/20/2013    IMPRESSION: Partially imaged distal left femur fracture, consider dedicated femur radiograph if clinically indicated.  Status post left hip total arthroplasty without radiographic findings  of hardware failure.  Severe degenerative change of the right hip.   Original Report Authenticated By: Awilda Metro   Dg Knee 1-2 Views Left  01/20/2013   **ADDENDUM** CREATED: 01/20/2013 02:03:46  Findings:  The frontal radiograph of the knee is obliqued due to patient condition, in frog-leg position.  **END ADDENDUM** SIGNED BY: Courtnay  Bloomer  01/20/2013     IMPRESSION: Comminuted, grossly aligned distal femur fracture extending to the femoral component of patient's total knee arthroplasty.  No dislocation.   Original Report Authenticated By: Awilda Metro    Scheduled Meds: . aspirin  81 mg Oral Daily  . ferrous sulfate  325 mg Oral TID PC  .  HYDROmorphone (DILAUDID) injection  1 mg Intravenous Once  . levothyroxine  75 mcg Oral QAC  breakfast  . losartan  50 mg Oral q morning - 10a  . metoprolol succinate  25 mg Oral q morning - 10a  . multivitamin with minerals  1 tablet Oral Daily  . simvastatin  20 mg Oral q1800   Continuous Infusions:   Principal Problem:   Closed fracture of left distal femur Active Problems:   Chronic atrial flutter   Obese   Coronary artery disease   Essential hypertension   Hyperlipidemia   Traumatic hematoma of left lower leg    Time spent: 20 minutes    [name]  [facility] Pager [phone]. If 7PM-7AM, please contact night-coverage at [email or web address], password [facility] 01/21/2013, 8:10 AM  LOS: 1 day

## 2013-01-22 ENCOUNTER — Encounter (HOSPITAL_COMMUNITY)
Admission: EM | Disposition: A | Discharge status: Skilled Nursing Facility | Payer: Self-pay | Source: Home / Self Care | Attending: Pulmonary Disease

## 2013-01-22 ENCOUNTER — Encounter (HOSPITAL_COMMUNITY): Payer: Self-pay | Admitting: Anesthesiology

## 2013-01-22 ENCOUNTER — Inpatient Hospital Stay (HOSPITAL_COMMUNITY): Payer: Medicare Other | Admitting: Anesthesiology

## 2013-01-22 ENCOUNTER — Inpatient Hospital Stay (HOSPITAL_COMMUNITY): Payer: Medicare Other

## 2013-01-22 ENCOUNTER — Encounter (HOSPITAL_COMMUNITY): Payer: Self-pay | Admitting: *Deleted

## 2013-01-22 DIAGNOSIS — E669 Obesity, unspecified: Secondary | ICD-10-CM

## 2013-01-22 HISTORY — PX: ORIF FEMUR FRACTURE: SHX2119

## 2013-01-22 LAB — CBC
MCHC: 33.6 g/dL (ref 30.0–36.0)
MCV: 91.9 fL (ref 78.0–100.0)
Platelets: 162 10*3/uL (ref 150–400)
RBC: 2.98 MIL/uL — ABNORMAL LOW (ref 4.22–5.81)
RDW: 14.5 % (ref 11.5–15.5)
WBC: 11.8 10*3/uL — ABNORMAL HIGH (ref 4.0–10.5)

## 2013-01-22 LAB — SURGICAL PCR SCREEN: MRSA, PCR: NEGATIVE

## 2013-01-22 LAB — PREPARE RBC (CROSSMATCH)

## 2013-01-22 SURGERY — OPEN REDUCTION INTERNAL FIXATION (ORIF) DISTAL FEMUR FRACTURE
Anesthesia: General | Site: Leg Upper | Laterality: Left | Wound class: Clean

## 2013-01-22 MED ORDER — ROCURONIUM BROMIDE 100 MG/10ML IV SOLN
INTRAVENOUS | Status: DC | PRN
  Administered 2013-01-22: 20 mg via INTRAVENOUS
  Administered 2013-01-22: 30 mg via INTRAVENOUS
  Administered 2013-01-22: 50 mg via INTRAVENOUS

## 2013-01-22 MED ORDER — LACTATED RINGERS IV SOLN
INTRAVENOUS | Status: DC | PRN
  Administered 2013-01-22: 21:00:00 via INTRAVENOUS

## 2013-01-22 MED ORDER — CEFAZOLIN SODIUM-DEXTROSE 2-3 GM-% IV SOLR
INTRAVENOUS | Status: AC
  Filled 2013-01-22: qty 50

## 2013-01-22 MED ORDER — FENTANYL CITRATE 0.05 MG/ML IJ SOLN
INTRAMUSCULAR | Status: DC | PRN
  Administered 2013-01-22 (×5): 50 ug via INTRAVENOUS

## 2013-01-22 MED ORDER — LACTATED RINGERS IV SOLN
INTRAVENOUS | Status: DC | PRN
  Administered 2013-01-22 (×2): via INTRAVENOUS

## 2013-01-22 MED ORDER — SODIUM CHLORIDE 0.9 % IV BOLUS (SEPSIS)
250.0000 mL | Freq: Once | INTRAVENOUS | Status: AC
  Administered 2013-01-22: 250 mL via INTRAVENOUS

## 2013-01-22 MED ORDER — PHENYLEPHRINE HCL 10 MG/ML IJ SOLN
INTRAMUSCULAR | Status: DC | PRN
  Administered 2013-01-22 (×2): 80 ug via INTRAVENOUS

## 2013-01-22 MED ORDER — SODIUM CHLORIDE 0.9 % IV SOLN
INTRAVENOUS | Status: DC
  Administered 2013-01-22 – 2013-01-23 (×2): via INTRAVENOUS

## 2013-01-22 MED ORDER — SODIUM CHLORIDE 0.9 % IV SOLN
INTRAVENOUS | Status: DC | PRN
  Administered 2013-01-22: 21:00:00 via INTRAVENOUS

## 2013-01-22 MED ORDER — GLYCOPYRROLATE 0.2 MG/ML IJ SOLN
INTRAMUSCULAR | Status: DC | PRN
  Administered 2013-01-22: 0.4 mg via INTRAVENOUS

## 2013-01-22 MED ORDER — PROPOFOL 10 MG/ML IV BOLUS
INTRAVENOUS | Status: DC | PRN
  Administered 2013-01-22: 70 mg via INTRAVENOUS

## 2013-01-22 MED ORDER — DILTIAZEM HCL 100 MG IV SOLR
5.0000 mg/h | INTRAVENOUS | Status: DC
  Administered 2013-01-22: 10 mg/h via INTRAVENOUS
  Filled 2013-01-22: qty 100

## 2013-01-22 MED ORDER — NEOSTIGMINE METHYLSULFATE 1 MG/ML IJ SOLN
INTRAMUSCULAR | Status: DC | PRN
  Administered 2013-01-22: 3 mg via INTRAVENOUS

## 2013-01-22 MED ORDER — PHENYLEPHRINE HCL 10 MG/ML IJ SOLN
20.0000 mg | INTRAMUSCULAR | Status: DC | PRN
  Administered 2013-01-22: 20 ug/min via INTRAVENOUS

## 2013-01-22 MED ORDER — CEFAZOLIN SODIUM-DEXTROSE 2-3 GM-% IV SOLR
INTRAVENOUS | Status: DC | PRN
  Administered 2013-01-22: 2 g via INTRAVENOUS

## 2013-01-22 MED ORDER — SUCCINYLCHOLINE CHLORIDE 20 MG/ML IJ SOLN
INTRAMUSCULAR | Status: DC | PRN
  Administered 2013-01-22: 100 mg via INTRAVENOUS

## 2013-01-22 MED ORDER — 0.9 % SODIUM CHLORIDE (POUR BTL) OPTIME
TOPICAL | Status: DC | PRN
  Administered 2013-01-22: 1000 mL

## 2013-01-22 MED ORDER — LIDOCAINE HCL (CARDIAC) 20 MG/ML IV SOLN
INTRAVENOUS | Status: DC | PRN
  Administered 2013-01-22: 30 mg via INTRAVENOUS

## 2013-01-22 SURGICAL SUPPLY — 66 items
BANDAGE ELASTIC 6 VELCRO ST LF (GAUZE/BANDAGES/DRESSINGS) ×2 IMPLANT
BANDAGE GAUZE ELAST BULKY 4 IN (GAUZE/BANDAGES/DRESSINGS) ×4 IMPLANT
BIT DRILL 3.2 CALIBRATED (BIT) ×1
BIT DRILL 3.2MM CALIBRATED (BIT) ×1 IMPLANT
BIT DRILL 3.8 CALIBRATED (BIT) ×1
BIT DRILL 3.8MM CALIBRATED (BIT) ×1 IMPLANT
CABLE CERLAGE W/CRIMP 1.8 (Cable) ×2 IMPLANT
CLOTH BEACON ORANGE TIMEOUT ST (SAFETY) ×2 IMPLANT
CUFF TOURNIQUET SINGLE 34IN LL (TOURNIQUET CUFF) IMPLANT
CUFF TOURNIQUET SINGLE 44IN (TOURNIQUET CUFF) IMPLANT
DRAPE C-ARM 42X72 X-RAY (DRAPES) ×2 IMPLANT
DRAPE C-ARMOR (DRAPES) ×2 IMPLANT
DRAPE INCISE IOBAN 66X45 STRL (DRAPES) ×2 IMPLANT
DRAPE ORTHO SPLIT 77X108 STRL (DRAPES) ×2
DRAPE SURG 17X23 STRL (DRAPES) ×2 IMPLANT
DRAPE SURG ORHT 6 SPLT 77X108 (DRAPES) ×2 IMPLANT
DRILL BIT 3.2MM CALIBRATED (BIT) ×1
DRILL BIT 3.8MM CALIBRATED (BIT) ×1
DRSG ADAPTIC 3X8 NADH LF (GAUZE/BANDAGES/DRESSINGS) ×2 IMPLANT
DRSG PAD ABDOMINAL 8X10 ST (GAUZE/BANDAGES/DRESSINGS) ×4 IMPLANT
DURAPREP 26ML APPLICATOR (WOUND CARE) ×2 IMPLANT
ELECT CAUTERY BLADE 6.4 (BLADE) ×2 IMPLANT
ELECT REM PT RETURN 9FT ADLT (ELECTROSURGICAL) ×2
ELECTRODE REM PT RTRN 9FT ADLT (ELECTROSURGICAL) ×1 IMPLANT
EVACUATOR 1/8 PVC DRAIN (DRAIN) IMPLANT
GAUZE XEROFORM 5X9 LF (GAUZE/BANDAGES/DRESSINGS) ×2 IMPLANT
GLOVE BIOGEL PI IND STRL 7.5 (GLOVE) ×1 IMPLANT
GLOVE BIOGEL PI IND STRL 8 (GLOVE) ×1 IMPLANT
GLOVE BIOGEL PI INDICATOR 7.5 (GLOVE) ×1
GLOVE BIOGEL PI INDICATOR 8 (GLOVE) ×1
GLOVE ORTHO TXT STRL SZ7.5 (GLOVE) ×6 IMPLANT
GLOVE SURG ORTHO 8.0 STRL STRW (GLOVE) IMPLANT
GOWN STRL NON-REIN LRG LVL3 (GOWN DISPOSABLE) ×6 IMPLANT
GUIDEPIN 3.2  ENDO CALB STRL (PIN) ×1
GUIDEPIN 3.2 ENDO CALB STRL (PIN) ×1 IMPLANT
K-WIRE ACE 1.6X6 (WIRE) ×2
KIT BASIN OR (CUSTOM PROCEDURE TRAY) ×2 IMPLANT
KIT ROOM TURNOVER OR (KITS) ×2 IMPLANT
KWIRE ACE 1.6X6 (WIRE) ×1 IMPLANT
MANIFOLD NEPTUNE II (INSTRUMENTS) ×2 IMPLANT
NS IRRIG 1000ML POUR BTL (IV SOLUTION) ×2 IMPLANT
PAD ARMBOARD 7.5X6 YLW CONV (MISCELLANEOUS) ×4 IMPLANT
PLATE FEM 12H LT (Plate) ×2 IMPLANT
SCREW CORT 4.5X48 (Screw) ×6 IMPLANT
SCREW LOCK DIST FEM 5.5X45 (Screw) ×2 IMPLANT
SCREW LOCK DIST FEM 5.5X80 (Screw) ×2 IMPLANT
SCREW LOCK DIST FEM 5.5X90 (Screw) ×2 IMPLANT
SCREW LOCK DIST FEM 5.5X95 (Screw) ×2 IMPLANT
SCREW LOCK PLY DIST FEM 4.5X12 (Screw) ×2 IMPLANT
SCREW LOCK PLY DIST FEM 4.5X14 (Screw) ×2 IMPLANT
SCREW LOCK PLY PROX TIB 8X90 (Screw) ×2 IMPLANT
SCREW NLOCK CORT STAR 4.5X46 (Screw) ×2 IMPLANT
SPONGE GAUZE 4X4 12PLY (GAUZE/BANDAGES/DRESSINGS) ×2 IMPLANT
SPONGE LAP 18X18 X RAY DECT (DISPOSABLE) ×8 IMPLANT
STAPLER VISISTAT 35W (STAPLE) ×4 IMPLANT
SUT VIC AB 0 CT1 27 (SUTURE) ×1
SUT VIC AB 0 CT1 27XBRD ANBCTR (SUTURE) ×1 IMPLANT
SUT VIC AB 1 CT1 27 (SUTURE) ×4
SUT VIC AB 1 CT1 27XBRD ANBCTR (SUTURE) ×4 IMPLANT
SUT VIC AB 2-0 CT1 27 (SUTURE) ×2
SUT VIC AB 2-0 CT1 TAPERPNT 27 (SUTURE) ×2 IMPLANT
TOWEL OR 17X24 6PK STRL BLUE (TOWEL DISPOSABLE) ×2 IMPLANT
TOWEL OR 17X26 10 PK STRL BLUE (TOWEL DISPOSABLE) ×2 IMPLANT
TUBE SUCT ARGYLE STRL (TUBING) ×2 IMPLANT
WATER STERILE IRR 1000ML POUR (IV SOLUTION) IMPLANT
YANKAUER SUCT BULB TIP NO VENT (SUCTIONS) ×2 IMPLANT

## 2013-01-22 NOTE — Progress Notes (Addendum)
Pt BP 90/50 with a pulse of 86. Pt also NPO for possible surgery. Paged Rito Ehrlich MD. Order 250 cc bolus of normal saline and hold Lopressor. Follow up BP was 98/58.

## 2013-01-22 NOTE — Progress Notes (Signed)
[facility] PROGRESS NOTE  Austin [name] AVW:[id number] DOB: [birth year] DOA: 01/20/2013 PCP: Ailene Ravel, MD  Assessment/Plan: Principal Problem:   Closed fracture of left distal femur: Pain controlled. Possible ORIF today if he can get him a schedule, otherwise tomorrow Active Problems:   Chronic atrial flutter: Heart rate still elevated. Anticoagulation on hold for pending surgery plus hematoma. I started IV Lopressor, which is currently on hold pending surgery   Obese: BMI at 31   Coronary artery disease: Stable   Essential hypertension: Stable   Hyperlipidemia: Stable   Traumatic hematoma of left lower leg: Xarelto being reversed   Code Status: Full code  Family Communication: Spoke with daughter by phone yesterday  Disposition Plan: Suspect likely will need skilled nursing   Consultants:  Noris/Olin-orthopedic surgery  Procedures:  Possible ORIF today  Antibiotics:  None  HPI/Subjective: Pt doing ok, no complaints. Waiting for surgery  Objective: Filed Vitals:   01/22/13 0800  BP:   Pulse:   Temp:   Resp: 18    Intake/Output Summary (Last 24 hours) at 01/22/13 0933 Last data filed at 01/22/13 0500  Gross per 24 hour  Intake    600 ml  Output    500 ml  Net    100 ml   Filed Weights   01/20/13 0541  Weight: 130.6 kg (287 lb 14.7 oz)    Exam:   General:  No acute distress, waiting for surgery  Cardiovascular: Irregular rhythm, rate controlled  Respiratory: Clear to auscultation bilaterally  Abdomen: Soft, nontender, nondistended, positive bowel sounds  Musculoskeletal: No clubbing or cyanosis, trace edema, in traction   Data Reviewed: Basic Metabolic Panel:  Recent Labs Lab 01/20/13 0049  NA 138  K 4.3  CL 102  CO2 23  GLUCOSE 133*  BUN 15  CREATININE 0.86  CALCIUM 8.5   Liver Function Tests:  Recent Labs Lab 01/20/13 0049  AST 16  ALT 10  ALKPHOS 125*  BILITOT 0.3  PROT 6.6  ALBUMIN 3.1*   No results  found for this basename: LIPASE, AMYLASE,  in the last 168 hours No results found for this basename: AMMONIA,  in the last 168 hours CBC:  Recent Labs Lab 01/20/13 0049 01/22/13 0610  WBC 6.8 11.8*  NEUTROABS 4.0  --   HGB 11.8* 9.2*  HCT 35.9* 27.4*  MCV 93.2 91.9  PLT 173 162   Cardiac Enzymes:  Recent Labs Lab 01/20/13 0048  TROPONINI <0.30   BNP (last 3 results) No results found for this basename: PROBNP,  in the last 8760 hours CBG: No results found for this basename: GLUCAP,  in the last 168 hours  Recent Results (from the past 240 hour(s))  MRSA PCR SCREENING     Status: None   Collection Time    01/20/13  6:15 AM      Result Value Range Status   MRSA by PCR NEGATIVE  NEGATIVE Final   Comment:            The GeneXpert MRSA Assay (FDA     approved for NASAL specimens     only), is one component of a     comprehensive MRSA colonization     surveillance program. It is not     intended to diagnose MRSA     infection nor to guide or     monitor treatment for     MRSA infections.  SURGICAL PCR SCREEN     Status: None   Collection Time  01/22/13 12:55 AM      Result Value Range Status   MRSA, PCR NEGATIVE  NEGATIVE Final   Staphylococcus aureus NEGATIVE  NEGATIVE Final   Comment:            The Xpert SA Assay (FDA     approved for NASAL specimens     in patients over 77 years of age),     is one component of     a comprehensive surveillance     program.  Test performance has     been validated by [employer] for patients greater     than or equal to 77 year old.     It is not intended     to diagnose infection nor to     guide or monitor treatment.     Studies: Dg Hip Complete Left  01/20/2013    IMPRESSION: Partially imaged distal left femur fracture, consider dedicated femur radiograph if clinically indicated.  Status post left hip total arthroplasty without radiographic findings of hardware failure.  Severe degenerative change of the right  hip.   Original Report Authenticated By: Awilda Metro   Dg Knee 1-2 Views Left  01/20/2013   **ADDENDUM** CREATED: 01/20/2013 02:03:46  Findings:  The frontal radiograph of the knee is obliqued due to patient condition, in frog-leg position.  **END ADDENDUM** SIGNED BY: Courtnay  Bloomer  01/20/2013     IMPRESSION: Comminuted, grossly aligned distal femur fracture extending to the femoral component of patient's total knee arthroplasty.  No dislocation.   Original Report Authenticated By: Awilda Metro    Scheduled Meds: . aspirin  81 mg Oral Daily  . ferrous sulfate  325 mg Oral TID PC  .  HYDROmorphone (DILAUDID) injection  1 mg Intravenous Once  . levothyroxine  75 mcg Oral QAC breakfast  . losartan  50 mg Oral q morning - 10a  . metoprolol  2.5 mg Intravenous Q6H  . multivitamin with minerals  1 tablet Oral Daily  . simvastatin  20 mg Oral q1800   Continuous Infusions:   Principal Problem:   Closed fracture of left distal femur Active Problems:   Chronic atrial flutter   Obese   Coronary artery disease   Essential hypertension   Hyperlipidemia   Traumatic hematoma of left lower leg    Time spent: 15 minutes    [name]  [facility] Pager [phone]. If 7PM-7AM, please contact night-coverage at [email or web address], password [facility] 01/22/2013, 9:33 AM  LOS: 2 days

## 2013-01-22 NOTE — Progress Notes (Signed)
Patient ID: Austin [name], male   DOB: 1926/06/24, 79 y.o.   MRN: [id number]  Left periprosthetic femur fracture between total knee and total hip stem after a ground level fall  Comfortable for now  Off Xarelto since Saturday  Will need ORIF of his femur  Unlikely I will be able to perform the operation today due to already packed schedule.  Will keep NPO for now in case something changes and time frees up.  Will notify nursing as soon as I am able to change diet and plan  Will place order for consent nonetheless  Plus the further off Xarelto the better in terms of peri-operative blood loss anemia

## 2013-01-22 NOTE — Progress Notes (Signed)
UR COMPLETED  

## 2013-01-22 NOTE — Progress Notes (Signed)
Pt had a loose, watery stool this pm. States he has had diarrhea since his last surgery. MD treated him by stopping laxatives, and he had one week of normal bms, however the diarrhea returned. Stool was brown, watery and slightly malodorous.

## 2013-01-22 NOTE — Anesthesia Preprocedure Evaluation (Addendum)
Anesthesia Evaluation  Patient identified by MRN, date of birth, ID band Patient awake    Reviewed: Allergy & Precautions, H&P , NPO status , Patient's Chart, lab work & pertinent test results  Airway Mallampati: I TM Distance: >3 FB Neck ROM: Full    Dental  (+) Teeth Intact, Poor Dentition, Missing and Dental Advisory Given   Pulmonary COPD breath sounds clear to auscultation        Cardiovascular hypertension, Pt. on medications and Pt. on home beta blockers + CAD Rhythm:Regular Rate:Tachycardia     Neuro/Psych    GI/Hepatic   Endo/Other    Renal/GU      Musculoskeletal   Abdominal   Peds  Hematology   Anesthesia Other Findings   Reproductive/Obstetrics                          Anesthesia Physical Anesthesia Plan  ASA: IV and emergent  Anesthesia Plan: General   Post-op Pain Management:    Induction: Intravenous  Airway Management Planned: Oral ETT  Additional Equipment: Arterial line and CVP  Intra-op Plan:   Post-operative Plan: Extubation in OR  Informed Consent: I have reviewed the patients History and Physical, chart, labs and discussed the procedure including the risks, benefits and alternatives for the proposed anesthesia with the patient or authorized representative who has indicated his/her understanding and acceptance.   Dental advisory given  Plan Discussed with: CRNA, Anesthesiologist and Surgeon  Anesthesia Plan Comments:        Anesthesia Quick Evaluation

## 2013-01-23 ENCOUNTER — Inpatient Hospital Stay (HOSPITAL_COMMUNITY): Payer: Medicare Other

## 2013-01-23 DIAGNOSIS — I959 Hypotension, unspecified: Secondary | ICD-10-CM

## 2013-01-23 DIAGNOSIS — S8010XA Contusion of unspecified lower leg, initial encounter: Secondary | ICD-10-CM

## 2013-01-23 LAB — CBC WITH DIFFERENTIAL/PLATELET
Basophils Absolute: 0 10*3/uL (ref 0.0–0.1)
Basophils Absolute: 0 10*3/uL (ref 0.0–0.1)
Basophils Relative: 0 % (ref 0–1)
Eosinophils Relative: 1 % (ref 0–5)
Eosinophils Relative: 1 % (ref 0–5)
HCT: 25.1 % — ABNORMAL LOW (ref 39.0–52.0)
HCT: 24.4 % — ABNORMAL LOW (ref 39.0–52.0)
Lymphocytes Relative: 11 % — ABNORMAL LOW (ref 12–46)
Lymphocytes Relative: 11 % — ABNORMAL LOW (ref 12–46)
Lymphs Abs: 1.1 10*3/uL (ref 0.7–4.0)
MCH: 30.5 pg (ref 26.0–34.0)
MCHC: 33.9 g/dL (ref 30.0–36.0)
MCV: 90 fL (ref 78.0–100.0)
Monocytes Absolute: 1.2 10*3/uL — ABNORMAL HIGH (ref 0.1–1.0)
Monocytes Relative: 12 % (ref 3–12)
Neutro Abs: 8.3 10*3/uL — ABNORMAL HIGH (ref 1.7–7.7)
Neutro Abs: 7.5 10*3/uL (ref 1.7–7.7)
Neutrophils Relative %: 76 % (ref 43–77)
Platelets: 148 10*3/uL — ABNORMAL LOW (ref 150–400)
Platelets: 141 10*3/uL — ABNORMAL LOW (ref 150–400)
RDW: 14.7 % (ref 11.5–15.5)
RDW: 14.9 % (ref 11.5–15.5)
WBC: 10.8 10*3/uL — ABNORMAL HIGH (ref 4.0–10.5)
WBC: 9.8 10*3/uL (ref 4.0–10.5)

## 2013-01-23 LAB — BASIC METABOLIC PANEL
BUN: 17 mg/dL (ref 6–23)
Calcium: 7.4 mg/dL — ABNORMAL LOW (ref 8.4–10.5)
Calcium: 7.4 mg/dL — ABNORMAL LOW (ref 8.4–10.5)
Chloride: 96 mEq/L (ref 96–112)
Creatinine, Ser: 0.71 mg/dL (ref 0.50–1.35)
Creatinine, Ser: 0.7 mg/dL (ref 0.50–1.35)
GFR calc Af Amer: >90 mL/min (ref >90)
GFR calc Af Amer: >90 mL/min (ref >90)
GFR calc non Af Amer: 84 mL/min — ABNORMAL LOW (ref >90)
Glucose, Bld: 136 mg/dL — ABNORMAL HIGH (ref 70–99)
Sodium: 135 mEq/L (ref 135–145)

## 2013-01-23 LAB — CBC
HCT: 25.4 % — ABNORMAL LOW (ref 39.0–52.0)
MCHC: 33.9 g/dL (ref 30.0–36.0)
MCV: 90.4 fL (ref 78.0–100.0)
RDW: 14.9 % (ref 11.5–15.5)
WBC: 11.7 10*3/uL — ABNORMAL HIGH (ref 4.0–10.5)

## 2013-01-23 LAB — GLUCOSE, CAPILLARY: Glucose-Capillary: 130 mg/dL — ABNORMAL HIGH (ref 70–99)

## 2013-01-23 MED ORDER — BIOTENE DRY MOUTH MT LIQD
15.0000 mL | Freq: Two times a day (BID) | OROMUCOSAL | Status: DC
  Administered 2013-01-23 – 2013-01-27 (×9): 15 mL via OROMUCOSAL

## 2013-01-23 MED ORDER — METOCLOPRAMIDE HCL 5 MG/ML IJ SOLN
5.0000 mg | Freq: Three times a day (TID) | INTRAMUSCULAR | Status: DC | PRN
  Filled 2013-01-23: qty 2

## 2013-01-23 MED ORDER — DIGOXIN 0.25 MG/ML IJ SOLN
0.2500 mg | Freq: Once | INTRAMUSCULAR | Status: AC
  Administered 2013-01-23: 0.25 mg via INTRAVENOUS
  Filled 2013-01-23: qty 1

## 2013-01-23 MED ORDER — SODIUM CHLORIDE 0.9 % IJ SOLN
10.0000 mL | Freq: Two times a day (BID) | INTRAMUSCULAR | Status: DC
  Administered 2013-01-23 (×2): 10 mL
  Administered 2013-01-24: 20 mL
  Administered 2013-01-25: 10 mL

## 2013-01-23 MED ORDER — ACETAMINOPHEN 650 MG RE SUPP: 650.0000 mg | Freq: Four times a day (QID) | RECTAL | Status: DC | PRN

## 2013-01-23 MED ORDER — FERROUS SULFATE 325 (65 FE) MG PO TABS
325.0000 mg | ORAL_TABLET | Freq: Three times a day (TID) | ORAL | Status: DC
  Filled 2013-01-23 (×4): qty 1

## 2013-01-23 MED ORDER — HYDROMORPHONE HCL PF 1 MG/ML IJ SOLN: 0.5000 mg | INTRAMUSCULAR | Status: DC | PRN

## 2013-01-23 MED ORDER — OXYCODONE HCL 5 MG/5ML PO SOLN: 5.0000 mg | Freq: Once | ORAL | Status: DC | PRN

## 2013-01-23 MED ORDER — OXYCODONE HCL 5 MG PO TABS: 5.0000 mg | ORAL_TABLET | Freq: Once | ORAL | Status: DC | PRN

## 2013-01-23 MED ORDER — ONDANSETRON HCL 4 MG/2ML IJ SOLN: 4.0000 mg | Freq: Four times a day (QID) | INTRAMUSCULAR | Status: DC | PRN

## 2013-01-23 MED ORDER — SODIUM CHLORIDE 0.9 % IV BOLUS (SEPSIS)
250.0000 mL | Freq: Once | INTRAVENOUS | Status: AC
  Administered 2013-01-23: 250 mL via INTRAVENOUS

## 2013-01-23 MED ORDER — HYDROMORPHONE HCL PF 1 MG/ML IJ SOLN
INTRAMUSCULAR | Status: AC
  Filled 2013-01-23: qty 1

## 2013-01-23 MED ORDER — DIGOXIN 0.25 MG/ML IJ SOLN
0.1250 mg | Freq: Once | INTRAMUSCULAR | Status: AC
  Administered 2013-01-23: 0.125 mg via INTRAVENOUS
  Filled 2013-01-23: qty 0.5

## 2013-01-23 MED ORDER — SODIUM CHLORIDE 0.9 % IV SOLN: INTRAVENOUS | Status: DC

## 2013-01-23 MED ORDER — ACETAMINOPHEN 325 MG PO TABS
650.0000 mg | ORAL_TABLET | Freq: Four times a day (QID) | ORAL | Status: DC | PRN
  Administered 2013-01-23 – 2013-01-24 (×2): 650 mg via ORAL
  Filled 2013-01-23 (×2): qty 2

## 2013-01-23 MED ORDER — PANTOPRAZOLE SODIUM 40 MG IV SOLR
40.0000 mg | INTRAVENOUS | Status: DC
  Administered 2013-01-23 – 2013-01-24 (×2): 40 mg via INTRAVENOUS
  Filled 2013-01-23 (×2): qty 40

## 2013-01-23 MED ORDER — CEFAZOLIN SODIUM-DEXTROSE 2-3 GM-% IV SOLR
2.0000 g | Freq: Four times a day (QID) | INTRAVENOUS | Status: AC
  Administered 2013-01-23 (×2): 2 g via INTRAVENOUS
  Filled 2013-01-23 (×2): qty 50

## 2013-01-23 MED ORDER — DIGOXIN 0.25 MG/ML IJ SOLN
0.5000 mg | Freq: Once | INTRAMUSCULAR | Status: AC
  Administered 2013-01-23: 0.5 mg via INTRAVENOUS
  Filled 2013-01-23 (×2): qty 2

## 2013-01-23 MED ORDER — PHENOL 1.4 % MT LIQD: 1.0000 | OROMUCOSAL | Status: DC | PRN

## 2013-01-23 MED ORDER — METHOCARBAMOL 500 MG PO TABS
500.0000 mg | ORAL_TABLET | Freq: Four times a day (QID) | ORAL | Status: DC | PRN
  Administered 2013-01-23 – 2013-01-26 (×2): 500 mg via ORAL
  Filled 2013-01-23: qty 1

## 2013-01-23 MED ORDER — RIVAROXABAN 20 MG PO TABS
20.0000 mg | ORAL_TABLET | Freq: Every day | ORAL | Status: DC
  Administered 2013-01-23 – 2013-01-26 (×4): 20 mg via ORAL
  Filled 2013-01-23 (×6): qty 1

## 2013-01-23 MED ORDER — DIGOXIN 0.25 MG/ML IJ SOLN
0.1250 mg | Freq: Every day | INTRAMUSCULAR | Status: DC
  Administered 2013-01-24 – 2013-01-27 (×4): 0.125 mg via INTRAVENOUS
  Filled 2013-01-23 (×5): qty 0.5

## 2013-01-23 MED ORDER — SODIUM CHLORIDE 0.9 % IV BOLUS (SEPSIS)
500.0000 mL | Freq: Once | INTRAVENOUS | Status: AC
  Administered 2013-01-23: 500 mL via INTRAVENOUS

## 2013-01-23 MED ORDER — HYDROMORPHONE HCL PF 1 MG/ML IJ SOLN
0.2500 mg | INTRAMUSCULAR | Status: DC | PRN
  Administered 2013-01-23 (×3): 0.5 mg via INTRAVENOUS

## 2013-01-23 MED ORDER — OXYCODONE HCL 5 MG PO TABS
5.0000 mg | ORAL_TABLET | ORAL | Status: DC | PRN
  Administered 2013-01-23 (×2): 10 mg via ORAL
  Administered 2013-01-26: 5 mg via ORAL
  Administered 2013-01-27: 10 mg via ORAL
  Filled 2013-01-23: qty 2
  Filled 2013-01-23: qty 1
  Filled 2013-01-23 (×2): qty 2

## 2013-01-23 MED ORDER — MENTHOL 3 MG MT LOZG: 1.0000 | LOZENGE | OROMUCOSAL | Status: DC | PRN

## 2013-01-23 MED ORDER — ONDANSETRON HCL 4 MG/2ML IJ SOLN: 4.0000 mg | Freq: Once | INTRAMUSCULAR | Status: DC | PRN

## 2013-01-23 MED ORDER — METOCLOPRAMIDE HCL 5 MG PO TABS
5.0000 mg | ORAL_TABLET | Freq: Three times a day (TID) | ORAL | Status: DC | PRN
  Filled 2013-01-23: qty 2

## 2013-01-23 MED ORDER — HYDROCODONE-ACETAMINOPHEN 5-325 MG PO TABS: 1.0000 | ORAL_TABLET | Freq: Four times a day (QID) | ORAL | Status: DC | PRN

## 2013-01-23 MED ORDER — ONDANSETRON HCL 4 MG PO TABS: 4.0000 mg | ORAL_TABLET | Freq: Four times a day (QID) | ORAL | Status: DC | PRN

## 2013-01-23 NOTE — Progress Notes (Signed)
Occupational Therapy Evaluation Patient Details Name: Austin [name] MRN: [id number] DOB: 1926-06-10 Today's Date: 01/23/2013 Time: 7829-5621 OT Time Calculation (min): 40 min  OT Assessment / Plan / Recommendation History of present illness Pt s/p ORIF to L distal femur fx. 79 y.o. male who just had a L hip replacement 6 weeks ago (also has h/o L knee replacement 9 years ago), who unfortunately suffered a fall when his leg gave out on him (no LOC).  Unfortunately he landed on his L leg.  Leg was grossly deformed and movement very painful, (pain worse with movement, better with rest).   Clinical Impression   PTA, pt living at home with wife and had just been D/Cd home from [facility] after rehab for THA. Pt was mod I for ADL. Pt with significant functional decline and will require skilled OT services to facilitate D/C to SNF for rehab. Pt would like to return to [facility] for rehab. Will follow.    OT Assessment  Patient needs continued OT Services    Follow Up Recommendations  SNF    Barriers to Discharge Decreased caregiver support    Equipment Recommendations  None recommended by OT    Recommendations for Other Services    Frequency  Min 2X/week     Precautions / Restrictions Precautions Precautions: Posterior Hip;Fall Restrictions Weight Bearing Restrictions: Yes LLE Weight Bearing: Non weight bearing   Pertinent Vitals/Pain Vitals stable. C/o pain throughout LLE, premedicated. Did not rate. Ice to LLE after session.    ADL  Grooming: Set up;Supervision/safety Where Assessed - Grooming: Supported sitting Upper Body Bathing: Minimal assistance Where Assessed - Upper Body Bathing: Supported sitting Lower Body Bathing: Maximal assistance Where Assessed - Lower Body Bathing: Supported sit to stand Upper Body Dressing: Minimal assistance Where Assessed - Upper Body Dressing: Supported sitting Lower Body Dressing: Maximal assistance Where Assessed - Lower Body Dressing:  Supported sit to Pharmacist, hospital: +2 Total assistance Toilet Transfer: Patient Percentage: 40% Statistician Method: Sit to stand Toileting - Architect and Hygiene: +1 Total assistance Where Assessed - Glass blower/designer Manipulation and Hygiene: Other (comment) (sit - stand simulated) Equipment Used: Gait belt;Rolling walker Transfers/Ambulation Related to ADLs: Total A +2. Pt 40%  [place] with PT due to medical complexity  OT Diagnosis: Generalized weakness;Acute pain  OT Problem List: Decreased strength;Decreased range of motion;Decreased activity tolerance;Impaired balance (sitting and/or standing);Decreased knowledge of use of DME or AE;Decreased knowledge of precautions;Cardiopulmonary status limiting activity;Obesity;Pain;Increased edema OT Treatment Interventions: Self-care/ADL training;Therapeutic exercise;DME and/or AE instruction;Therapeutic activities;Patient/family education;Balance training   OT Goals(Current goals can be found in the care plan section) Acute Rehab OT Goals Patient Stated Goal: to get to [facility] rehab OT Goal Formulation: With patient Time For Goal Achievement: 02/06/13 Potential to Achieve Goals: Good  Visit Information  Last OT Received On: 01/23/13 Assistance Needed: +2 History of Present Illness: Pt s/p ORIF to L distal femur fx. 79 y.o. male who just had a L hip replacement 6 weeks ago (also has h/o L knee replacement 9 years ago), who unfortunately suffered a fall when his leg gave out on him (no LOC).  Unfortunately he landed on his L leg.  Leg was grossly deformed and movement very painful, (pain worse with movement, better with rest).       Prior Functioning     Home Living Family/patient expects to be discharged to:: Skilled nursing facility Living Arrangements: Children Available Help at Discharge: Available 24 hours/day Type of Home: House Home Access: Stairs to  enter Entrance Stairs-Number of Steps: 1 Home Layout: One  level Home Equipment: Walker - 2 wheels;Bedside commode;Grab bars - toilet;Grab bars - tub/shower Additional Comments: pt desires to return to [facility] [place] Prior Function Level of Independence: Needs assistance Gait / Transfers Assistance Needed: used RW Communication Communication: No difficulties Dominant Hand: Right         Vision/Perception     Cognition  Cognition Arousal/Alertness: Awake/alert Behavior During Therapy: WFL for tasks assessed/performed Overall Cognitive Status: Within Functional Limits for tasks assessed    Extremity/Trunk Assessment Upper Extremity Assessment Upper Extremity Assessment: Overall WFL for tasks assessed Lower Extremity Assessment Lower Extremity Assessment: LLE deficits/detail LLE Deficits / Details: pt with increased pain with flexion of L knee > 25 deg Cervical / Trunk Assessment Cervical / Trunk Assessment: Normal     Mobility Bed Mobility Bed Mobility: Supine to Sit;Sitting - Scoot to Edge of Bed Supine to Sit: 1: +2 Total assist;HOB elevated Supine to Sit: Patient Percentage: 40% Sitting - Scoot to Edge of Bed: 1: +2 Total assist Sitting - Scoot to Edge of Bed: Patient Percentage: 40% Details for Bed Mobility Assistance: pt able to assist with LE management however required significant assist for trunk elevation and to bring hips to EOB Transfers Transfers: Sit to Stand;Stand to Sit Sit to Stand: 1: +2 Total assist;With upper extremity assist;From bed Sit to Stand: Patient Percentage: 50% Stand to Sit: 1: +2 Total assist;Without upper extremity assist;To chair/3-in-1 Stand to Sit: Patient Percentage: 50% Details for Transfer Assistance: pt required significant assist initially but then able to achieve full upright standing. assist to maintain L LE NWB until upright then patient able to maintain L LE NWB when in standing     Exercise   Balance Balance Balance Assessed: Yes Static Sitting Balance Static Sitting -  Balance Support: Bilateral upper extremity supported;Feet supported Static Sitting - Level of Assistance: 4: Min assist Static Sitting - Comment/# of Minutes: 6 min EOB, pt desire to hold onto PTs hand to maintain balance Static Standing Balance Static Standing - Balance Support: Bilateral upper extremity supported Static Standing - Level of Assistance: 4: Min assist Static Standing - Comment/# of Minutes: 3 min for hygiene and to move bed out and place chair behind patient. Pt able to maintain L LE NWB   End of Session OT - End of Session Equipment Utilized During Treatment: Gait belt;Rolling walker Activity Tolerance: Patient limited by fatigue Patient left: in chair;with call bell/phone within reach;with family/visitor present Nurse Communication: Mobility status;Need for lift equipment  GO     Kippy Melena,HILLARY 01/23/2013, 4:20 PM

## 2013-01-23 NOTE — Clinical Social Work Placement (Addendum)
Clinical Social Work Department CLINICAL SOCIAL WORK PLACEMENT NOTE 01/23/2013  Patient:  Austin [name], Austin [name]  Account Number:  [id number] Admit date:  01/20/2013  Clinical Social Worker:  Macario Golds, LCSW  Date/time:  01/23/2013 04:00 PM  Clinical Social Work is seeking post-discharge placement for this patient at the following level of care:   SKILLED NURSING   (*CSW will update this form in Epic as items are completed)   01/23/2013  Patient/family provided with [facility] Department of Clinical Social Work's list of facilities offering this level of care within the geographic area requested by the patient (or if unable, by the patient's family).  01/23/2013  Patient/family informed of their freedom to choose among providers that offer the needed level of care, that participate in Medicare, Medicaid or managed care program needed by the patient, have an available bed and are willing to accept the patient.  01/23/2013  Patient/family informed of [facility]' ownership interest in [facility], as well as of the fact that they are under no obligation to receive care at this facility.  PASARR submitted to EDS on 01/23/2013 PASARR number received from EDS on 01/23/2013  FL2 transmitted to all facilities in geographic area requested by pt/family on  01/23/2013 FL2 transmitted to all facilities within larger geographic area on   Patient informed that his/her managed care company has contracts with or will negotiate with  certain facilities, including the following:     Patient/family informed of bed offers received:  01/24/2013 Patient chooses bed at  Physician recommends and patient chooses bed at    Patient to be transferred to  on   Patient to be transferred to facility by   The following physician request were entered in Epic:   Additional Comments: 10/7 - Patient only agreeable to [facility] [place]

## 2013-01-23 NOTE — Anesthesia Postprocedure Evaluation (Signed)
  Anesthesia Post-op Note  Patient: Austin [name]  Procedure(s) Performed: Procedure(s): OPEN REDUCTION INTERNAL FIXATION (ORIF) DISTAL FEMUR FRACTURE (Left)  Patient Location: PACU  Anesthesia Type:General  Level of Consciousness: awake, alert  and oriented  Airway and Oxygen Therapy: Patient Spontanous Breathing and Patient connected to face mask oxygen  Post-op Pain: mild  Post-op Assessment: Post-op Vital signs reviewed  Post-op Vital Signs: Reviewed  Complications: No apparent anesthesia complications

## 2013-01-23 NOTE — Evaluation (Signed)
Physical Therapy Evaluation Patient Details Name: Austin [name] MRN: [id number] DOB: 02-14-1927 Today's Date: 01/23/2013 Time: 4098-1191 PT Time Calculation (min): 40 min  PT Assessment / Plan / Recommendation History of Present Illness  Pt s/p ORIF to L distal femur fx. 79 y.o. male who just had a L hip replacement 6 weeks ago (also has h/o L knee replacement 9 years ago), who unfortunately suffered a fall when his leg gave out on him (no LOC).  Unfortunately he landed on his L leg.  Leg was grossly deformed and movement very painful, (pain worse with movement, better with rest).  Clinical Impression  Pt with increased pain limiting transfer/standing tolerance. Pt aware he needs to return to rehab and desires to return to [facility]. Pt to strongly benefit from ST-SNF s/p d/c from hospital to maximize functional return for safe d/c home with daughter.    PT Assessment  Patient needs continued PT services    Follow Up Recommendations  SNF    Does the patient have the potential to tolerate intense rehabilitation      Barriers to Discharge        Equipment Recommendations  None recommended by PT    Recommendations for Other Services     Frequency Min 5X/week    Precautions / Restrictions Precautions Precautions: Posterior Hip;Fall Restrictions Weight Bearing Restrictions: Yes LLE Weight Bearing: Non weight bearing   Pertinent Vitals/Pain 8/10 L LE pain      Mobility  Bed Mobility Bed Mobility: Supine to Sit;Sitting - Scoot to Edge of Bed Supine to Sit: 1: +2 Total assist;HOB elevated Supine to Sit: Patient Percentage: 40% Sitting - Scoot to Edge of Bed: 1: +2 Total assist Sitting - Scoot to Edge of Bed: Patient Percentage: 40% Details for Bed Mobility Assistance: pt able to assist with LE management however required significant assist for trunk elevation and to bring hips to EOB Transfers Transfers: Sit to Stand;Stand to Sit Sit to Stand: 1: +2 Total assist;With  upper extremity assist;From bed Sit to Stand: Patient Percentage: 50% Stand to Sit: 1: +2 Total assist;Without upper extremity assist;To chair/3-in-1 Stand to Sit: Patient Percentage: 50% Details for Transfer Assistance: pt required significant assist initially but then able to achieve full upright standing. assist to maintain L LE NWB until upright then patient able to maintain L LE NWB when in standing Ambulation/Gait Ambulation/Gait Assistance: Not tested (comment)    Exercises Total Joint Exercises Ankle Circles/Pumps: AROM;Both;10 reps;Supine Quad Sets: AROM;Left;10 reps;Supine   PT Diagnosis: Difficulty walking;Generalized weakness  PT Problem List: Decreased strength;Decreased range of motion;Decreased activity tolerance;Decreased balance;Decreased mobility PT Treatment Interventions: DME instruction;Gait training;Functional mobility training;Therapeutic activities;Therapeutic exercise     PT Goals(Current goals can be found in the care plan section) Acute Rehab PT Goals Patient Stated Goal: to get to [facility] rehab PT Goal Formulation: With patient Time For Goal Achievement: 02/06/13 Potential to Achieve Goals: Good  Visit Information  Last PT Received On: 01/23/13 Assistance Needed: +2 History of Present Illness: Pt s/p ORIF to L distal femur fx. 79 y.o. male who just had a L hip replacement 6 weeks ago (also has h/o L knee replacement 9 years ago), who unfortunately suffered a fall when his leg gave out on him (no LOC).  Unfortunately he landed on his L leg.  Leg was grossly deformed and movement very painful, (pain worse with movement, better with rest).       Prior Functioning  Home Living Family/patient expects to be discharged to:: Skilled nursing  facility Living Arrangements: Children Available Help at Discharge: Available 24 hours/day Type of Home: House Home Access: Stairs to enter Entrance Stairs-Number of Steps: 1 Home Equipment: Walker - 2 wheels;Bedside  commode;Grab bars - toilet;Grab bars - tub/shower Additional Comments: pt desires to return to [facility] [place] Prior Function Level of Independence: Needs assistance Gait / Transfers Assistance Needed: used RW Communication Communication: No difficulties Dominant Hand: Right    Cognition  Cognition Arousal/Alertness: Awake/alert Behavior During Therapy: WFL for tasks assessed/performed Overall Cognitive Status: Within Functional Limits for tasks assessed    Extremity/Trunk Assessment Upper Extremity Assessment Upper Extremity Assessment: Overall WFL for tasks assessed Lower Extremity Assessment Lower Extremity Assessment: LLE deficits/detail LLE Deficits / Details: pt with increased pain with flexion of L knee > 25 deg Cervical / Trunk Assessment Cervical / Trunk Assessment: Normal   Balance Balance Balance Assessed: Yes Static Sitting Balance Static Sitting - Balance Support: Bilateral upper extremity supported;Feet supported Static Sitting - Level of Assistance: 4: Min assist Static Sitting - Comment/# of Minutes: 6 min EOB, pt desire to hold onto PTs hand to maintain balance Static Standing Balance Static Standing - Balance Support: Bilateral upper extremity supported Static Standing - Level of Assistance: 4: Min assist Static Standing - Comment/# of Minutes: 3 min for hygiene and to move bed out and place chair behind patient. Pt able to maintain L LE NWB  End of Session PT - End of Session Equipment Utilized During Treatment: Gait belt;Oxygen Activity Tolerance: Patient limited by fatigue;Patient limited by pain Patient left: in chair;with call bell/phone within reach;with nursing/sitter in room Nurse Communication: Mobility status;Need for lift equipment  GP     Marcene Brawn 01/23/2013, 3:25 PM  Lewis Shock, PT, DPT Pager #: [phone] Office #: [phone]

## 2013-01-23 NOTE — Brief Op Note (Signed)
01/20/2013 - 01/23/2013  12:26 AM  PATIENT:  Austin [name]  79 y.o. male  PRE-OPERATIVE DIAGNOSIS:  Left distal femur periprosthetic femur fracture  POST-OPERATIVE DIAGNOSIS:  Left distal femur periprosthetic femur fracture  PROCEDURE:  Procedure(s): OPEN REDUCTION INTERNAL FIXATION (ORIF) DISTAL FEMUR FRACTURE (Left)  SURGEON:  Surgeon(s) and Role:    * Shelda Pal, MD - Primary  PHYSICIAN ASSISTANT: None  ASSISTANTS: Surgical team  ANESTHESIA:   general  EBL:  Total I/O In: 3000 [I.V.:2300; Blood:700] Out: 335 [Urine:335]  BLOOD ADMINISTERED:2 units PRBCs   DRAINS: none   LOCAL MEDICATIONS USED:  NONE  SPECIMEN:  No Specimen  DISPOSITION OF SPECIMEN:  N/A  COUNTS:  YES  TOURNIQUET:  * No tourniquets in log *  DICTATION: .Other Dictation: Dictation Number [id number]  PLAN OF CARE: Admit to inpatient   PATIENT DISPOSITION:  ICU - extubated and stable.   Delay start of Pharmacological VTE agent (>24hrs) due to surgical blood loss or risk of bleeding: no

## 2013-01-23 NOTE — Progress Notes (Signed)
PULMONARY  / CRITICAL CARE MEDICINE  Name: Austin [name] MRN: [id number] DOB: 07/12/1926    ADMISSION DATE:  01/20/2013 CONSULTATION DATE: 01/23/13  REFERRING MD :  Gaylord Shih PRIMARY SERVICE:  PCCM  CHIEF COMPLAINT:  Post-op hypotension  BRIEF PATIENT DESCRIPTION: 79 yo male with past medical history of HTN, AAA, a-flutter, left hip replacement taken to the OR for left distal femur fracture. Post-op placed on Cardizem gtt, hypotensive. PCCM asked to evaluate.  SIGNIFICANT EVENTS / STUDIES:  10/7  OR for periprosthetic femur fracture, hypotension post-op 10/7- remains tachy, not on cardizem, neo  LINES / TUBES: Foley 10/7 >>> Right IJ CVL 10/7 >>>  CULTURES: MRSA 10/7>>> Neg  ANTIBIOTICS: Cefazolin 10/7 >>>  SUBJECTIVE: tachy  VITAL SIGNS: Temp:  [97.3 F (36.3 C)-98.3 F (36.8 C)] 98.2 F (36.8 C) (10/07 0400) Pulse Rate:  [49-134] 98 (10/07 0600) Resp:  [12-23] 23 (10/07 0600) BP: (65-112)/(39-82) 85/52 mmHg (10/07 0600) SpO2:  [96 %-100 %] 98 % (10/07 0600) Arterial Line BP: (105)/(43) 105/43 mmHg (10/07 0030)   INTAKE / OUTPUT: Intake/Output     10/06 0701 - 10/07 0700 10/07 0701 - 10/08 0700   P.O.     I.V. (mL/kg) 3725 (28.5)    Blood 700    IV Piggyback 1050    Total Intake(mL/kg) 5475 (41.9)    Urine (mL/kg/hr) 665 (0.2)    Total Output 665     Net +4810           PHYSICAL EXAMINATION: General:  Appears acutely ill,on 4L N/C, resting in bed Neuro:  A&Ox3, responds appropriately HEENT: NCAT,  PERRL Cardiovascular:  Irregularly, irregular Lungs:  Bilateral diminished breath sounds, no use of accessory muscles  Abdomen:  Soft, nontender, + BS Musculoskeletal:  Moves all extremities, no edema, bilateral SCDs in place Ext: left upper leg with surgical dressing in place, clean/dry, feet cool to touch bilaterally Skin:  Intact, warm and dry   LABS:  Recent Labs Lab 01/20/13 0048 01/20/13 0049 01/20/13 0815 01/22/13 0610 01/23/13 0442  HGB  --   11.8*  --  9.2* 8.6*  WBC  --  6.8  --  11.8* 11.7*  PLT  --  173  --  162 132*  NA  --  138  --   --  135  K  --  4.3  --   --  4.1  CL  --  102  --   --  101  CO2  --  23  --   --  24  GLUCOSE  --  133*  --   --  131*  BUN  --  15  --   --  17  CREATININE  --  0.86  --   --  0.71  CALCIUM  --  8.5  --   --  7.4*  AST  --  16  --   --   --   ALT  --  10  --   --   --   ALKPHOS  --  125*  --   --   --   BILITOT  --  0.3  --   --   --   PROT  --  6.6  --   --   --   ALBUMIN  --  3.1*  --   --   --   APTT  --  47*  --   --   --   INR  --  2.50* 1.68*  --   --  TROPONINI <0.30  --   --   --   --     Recent Labs Lab 01/23/13 0057  GLUCAP 130*    ASSESSMENT / PLAN:  PULMONARY A:   Risk edema with fib rvr P:   -Gaol SpO2>92 -Supplemental O2 as needed - post op IS -pcxr as high risk edema with fib rvr  CARDIOVASCULAR A:  CAD Atrial flutter Hx of AAA Hypotension- likely secondary to general anesthesia and Cardizem, patient fluctuating BP - improved Afib now rvr P:  -Goal MAP>60 -Neosynephrine may need to be restarted with rate control needs -consider Amiodarone, after attempt at digoxin load IV, follow renal fxn closely -Continue preadmission Zocor, ASA, Cozaar, Metoprolol  RENAL A:  Mid olguria P:   -assess cvp  Now -goal output 30 cc/hr (0.5 cc/kg/hr ideal) -Trend BMP on dig -NS@100 , likely need to [facility] this with risk edema , cvp pending  GASTROINTESTINAL A:   No acute issue P:   -GI Px is not required, start if vasopressors - consider advancing diet to solid foods  HEMATOLOGIC A:  Anemia of acute blood loss Coagulopathy P:  -Xarelto -Trend CBC in am  -continue Iron  INFECTIOUS A:   On Ancef, peri-op P:   -Continue per surgery -wound care  ENDOCRINE  A:   Hx hypothyroidism P:   -Continue home Synthroid -monitor CBGs  NEUROLOGIC A:   Acute encephalopathy- resolved Post op pain P:   -Dilaudid / Oxycodone PRN  -Robaxin PRN -will  need pt when hemodynamics improve  Corrina Baglia, [employer]  I have personally obtained history, examined patient, evaluated and interpreted laboratory and imaging results, reviewed medical records, edited assessment / plan and placed orders. Ccm time 30 min  I have fully examined this patient and agree with above findings.    And edited  In full   01/23/2013, 7:10 AM  Mcarthur Rossetti. Tyson Alias, MD, FACP Pgr: [phone]  Pulmonary & Critical Care

## 2013-01-23 NOTE — Progress Notes (Signed)
Patient ID: Austin [name], male   DOB: 07/12/1926, 79 y.o.   MRN: [id number] Subjective: 1 Day Post-Op Procedure(s) (LRB): OPEN REDUCTION INTERNAL FIXATION (ORIF) DISTAL FEMUR FRACTURE (Left)    Patient reports pain as moderate.  In ICU due to cardiac morbidity.  Due to lower blood pressures pain meds have been minimized.  Otherwise no major events   Objective:   VITALS:   Filed Vitals:   01/23/13 0710  BP: 107/43  Pulse: 129  Temp:   Resp: 22    Neurovascular intact Incision: dressing C/D/I  LABS  Recent Labs  01/22/13 0610 01/23/13 0442  HGB 9.2* 8.6*  HCT 27.4* 25.4*  WBC 11.8* 11.7*  PLT 162 132*     Recent Labs  01/23/13 0442  NA 135  K 4.1  BUN 17  CREATININE 0.71  GLUCOSE 131*     Recent Labs  01/20/13 0815  INR 1.68*     Assessment/Plan: 1 Day Post-Op Procedure(s) (LRB): OPEN REDUCTION INTERNAL FIXATION (ORIF) DISTAL FEMUR FRACTURE (Left)   {Plan: Appreciate the continued medical support that he requires NWB LLE But leg can be moved freely  Will follow Pain meds as deemed appropriate per medical stability

## 2013-01-23 NOTE — Progress Notes (Signed)
Orthopedic Tech Progress Note Patient Details:  Austin [name] 1927-02-06 [id number]     Overhead frame  Haskell Flirt 01/23/2013, 6:57 AM

## 2013-01-23 NOTE — Transfer of Care (Signed)
Immediate Anesthesia Transfer of Care Note  Patient: Austin [name]  Procedure(s) Performed: Procedure(s): OPEN REDUCTION INTERNAL FIXATION (ORIF) DISTAL FEMUR FRACTURE (Left)  Patient Location: PACU  Anesthesia Type:General  Level of Consciousness: sedated, patient cooperative and responds to stimulation  Airway & Oxygen Therapy: Patient Spontanous Breathing and Patient connected to face mask oxygen  Post-op Assessment: Report given to PACU RN, Post -op Vital signs reviewed and stable and Patient moving all extremities X 4  Post vital signs: Reviewed and stable  Complications: No apparent anesthesia complications

## 2013-01-23 NOTE — Op Note (Signed)
NAME:  Austin [name], Austin [name] NO.:  [id number]  MEDICAL RECORD NO.:  [id number]  LOCATION:  [id number]                        FACILITY:  [facility]  PHYSICIAN:  Madlyn Frankel. Charlann Boxer, M.D.  DATE OF BIRTH:  22-Dec-1926  DATE OF PROCEDURE:  01/22/2013 DATE OF DISCHARGE:                              OPERATIVE REPORT   PREOPERATIVE DIAGNOSIS:  Left distal periprosthetic femur fracture proximal to a total knee arthroplasty and distal to a total hip arthroplasty.  POSTOPERATIVE DIAGNOSIS:  Left distal periprosthetic femur fracture proximal to a total knee arthroplasty and distal to a total hip arthroplasty.  PROCEDURE:  Open reduction and internal fixation of left distal periprosthetic femur fracture utilizing a Biomet Polyaxial distal femoral plate.  SURGEON:  Madlyn Frankel. Charlann Boxer, M.D.  ASSISTANT:  Surgical team.  ANESTHESIA:  General.  SPECIMENS:  None.  DRAINS:  None.  COMPLICATIONS:  None apparent.  BLOOD LOSS:  About 700 mL.  IV FLUIDS:  Did include 2 units of packed red blood cells given from a preoperative and perioperative management standpoint.  INDICATION FOR PROCEDURE:  Mr. Sanzone is an 79 year old gentleman with multiple medical comorbidities.  He is approximately 7 weeks out from revision left total hip replacement that he was recovering from fairly well.  He unfortunately stumbled in his kitchen and fell sustaining immediate injury and inability to bear weight in his left lower extremity.  He was brought to the emergency room by EMS where radiographs revealed a distal femur fracture.  He was admitted to the Medical Service and once cleared, I was consulted for orthopedic management.  Mr. Mckesson is a patient of mine from this revision left hip surgery, and so I was readily willing to attempt to help with his management.  After reviewing with the family the risks, benefits, the necessity of the operative procedure, consent was obtained for benefit of fracture  management.  PROCEDURE IN DETAIL:  The patient was brought to operative theater. Once adequate anesthesia was established, which was done carefully with central line in A, and an A-line.  He was then positioned supine on the table.  The groin was prepped out, the leg was prescrubbed, and the left lower extremity was then prepped and draped in sterile fashion.  Time- out was performed identifying the patient, planned procedure, extremity. 2 g of Ancef administered.  Under fluoroscopic imaging, I identified the distal aspect of femur, the fracture site, as well as the tip of the femoral stem.  I then marked out on the skin and made a lateral based incision.  First exposed the distal aspect of the femur including the fracture site temporarily reducing the fracture after some effort to get the fracture reduced.  I then selected based on evaluation with a templated guide, the 12-hole plate.  The 12-hole plate was then slid along the femur submuscularly.  With the fracture held reduced with clamps, the plate was held to the bone and after several attempts radiographic confirmation of plate was secured to the bone in its correct orientation, both in the AP and lateral planes.  Once secured, I placed 5 distal screws, all locking screws.  The initial screw was a large 8.0 mm cannulated screw.  This was  done with the plate held to bone using the King-Kong clamp.  The remaining screws were placed, all locking.  At this point too, I placed a bicortical proximal locking or nonlocking screw.  I then placed 2 other nonlocking bicortical screws proximal to the fracture segment and then 2 unicortical locking screws, which were at the distal portion of the previously placed femoral stem, but in the most proximal aspect of the 12-hole plate.  At this point, I basically had a bridge construct.  In an effort to maintain reduction of a large posterior oblique fracture, I did place a single Zimmer cable  around the plate and the fracture site.  Final radiographs were obtained in AP and lateral planes.  I was satisfied with the overall reduction.  We irrigated the wound with normal saline solution at the end of the case.  The iliotibial band was then reapproximated over the vastus lateralis musculature using #1 Vicryl.  The remainder of the wound was closed with 2-0 Vicryl and staples on the skin.  The skin was cleaned, dried, and dressed sterilely using Xeroform and a bulky sterile wrap.  He was then extubated, actually brought to the recovery room in a stable condition and plan to go to the intensive care unit based on his perioperative management.  He will be nonweightbearing unfortunately for at least 6-8 weeks to get fracture union.  Range of motion of the knee will be permissible based on the stability of the fracture.  Findings will be reviewed with family.     Madlyn Frankel Charlann Boxer, M.D.     MDO/MEDQ  D:  01/23/2013  T:  01/23/2013  Job:  [id number]

## 2013-01-23 NOTE — Consult Note (Signed)
PULMONARY  / CRITICAL CARE MEDICINE  Name: Austin [name] MRN: [id number] DOB: [birth year]    ADMISSION DATE:  01/20/2013 CONSULTATION DATE: 10/7/  REFERRING MD :  Gaylord Shih PRIMARY SERVICE:  PCCM  CHIEF COMPLAINT:  Post-op hypotension  BRIEF PATIENT DESCRIPTION: 79 yo male with past medical history of HTN, AAA, a-flutter, left hip replacement taken to the OR for left distal femur fracture. Post-op placed on Cardizem gtt, hypotensive, PCCM asked to evaluate.  SIGNIFICANT EVENTS / STUDIES:  10/7  OR for periprosthetic femur fracture, hypotension post-op  LINES / TUBES: Foley 10/7 >>> Right IJ CVL 10/7 >>>  CULTURES:  ANTIBIOTICS: Cefazolin 10/7 >>>  The patient is encephalopathic and unable to provide history, which was obtained for available medical records.  HISTORY OF PRESENT ILLNESS: 79 yo male with past medical history of HTN, AAA, a-flutter, left hip replacement taken to the OR for left distal femur fracture. Post-op placed on Cardizem gtt, hypotensive, PCCM asked to evaluate.  PAST MEDICAL HISTORY :  Past Medical History  Diagnosis Date  . Hypertension   . Hyperlipidemia   . AAA (abdominal aortic aneurysm)   . Arthritis   . CAD (coronary artery disease)   . Thyroid disease     hypothyroidism  . Dysrhythmia     atrial flutter  . Shortness of breath     a little with exertion  . Polio 1934  . Atrial flutter     chronic anticoagulation  . COPD (chronic obstructive pulmonary disease)   . History of nuclear stress test 04/2010    dipyridamole; small, mostly fixed basal to mid inferior and inferoseptal defect (gut artifact v. scar); post stress EF 62%; non-diagnostic for ischemia; low risk    Past Surgical History  Procedure Laterality Date  . Abdominal aortic aneurysm repair  05/21/2010    aorto bi-iliac B[facility]  . Appendectomy  1949  . Popliteal synovial cyst excision Left   . Joint replacement      Knee and Hip replacements  . Total hip arthroplasty Left 1994   . Knee arthroplasty Left 2004  . Total hip revision Left 12/04/2012    Procedure: LEFT TOTAL HIP REVISION;  Surgeon: Shelda Pal, MD;  Location: [facility];  Service: Orthopedics;  Laterality: Left;  . Cardiac catheterization  2009    in [place]; 50-60% RCA  . Transthoracic echocardiogram  04/2010    EF =>55%; RV borderline dilated; LA mildly dilated; mild mitral annular calcif & mild MR; mild TR; mild calcif of AV leaflets with mild AV stenosis; aortic root sclerosis/calcif   Prior to Admission medications   Medication Sig Start Date End Date Taking? Authorizing Provider  aspirin 81 MG tablet Take 81 mg by mouth daily.   Yes Historical Provider, MD  cholecalciferol (VITAMIN D) 1000 UNITS tablet Take 1,000 Units by mouth daily.   Yes Historical Provider, MD  ferrous sulfate 325 (65 FE) MG tablet Take 1 tablet (325 mg total) by mouth 3 (three) times daily after meals. 12/08/12  Yes Genelle Gather Babish, PA-C  levothyroxine (SYNTHROID, LEVOTHROID) 75 MCG tablet Take 75 mcg by mouth daily before breakfast.   Yes Historical Provider, MD  losartan (COZAAR) 50 MG tablet Take 50 mg by mouth every morning.    Yes Historical Provider, MD  lovastatin (MEVACOR) 40 MG tablet Take 40 mg by mouth every morning.    Yes Historical Provider, MD  metoprolol succinate (TOPROL-XL) 25 MG 24 hr tablet Take 25 mg by mouth every morning.  Yes Historical Provider, MD  Multiple Vitamin (MULTIVITAMIN WITH MINERALS) TABS tablet Take 1 tablet by mouth daily.   Yes Historical Provider, MD  Rivaroxaban (XARELTO) 20 MG TABS Take 20 mg by mouth daily.   Yes Historical Provider, MD  trolamine salicylate (ASPERCREME) 10 % cream Apply 1 application topically as needed (for muscle pain).   Yes Historical Provider, MD  zolpidem (AMBIEN) 10 MG tablet Take 10 mg by mouth at bedtime as needed for sleep.   Yes Historical Provider, MD   No Known Allergies  FAMILY HISTORY:  Family History  Problem Relation Age of Onset  . Other Father      varicose veins  . Hyperlipidemia Son   . Hypertension Son   . AAA (abdominal aortic aneurysm) Son   . Lung cancer Brother    SOCIAL HISTORY:  reports that he quit smoking about 46 years ago. His smoking use included Cigarettes. He has a 25 pack-year smoking history. He has never used smokeless tobacco. He reports that  drinks alcohol. He reports that he does not use illicit drugs.  REVIEW OF SYSTEMS:  Unable to provide.  INTERVAL HISTORY:   VITAL SIGNS: Temp:  [97.3 F (36.3 C)-99.6 F (37.6 C)] 98.3 F (36.8 C) (10/07 0013) Pulse Rate:  [64-90] 64 (10/07 0013) Resp:  [16-18] 16 (10/06 1600) BP: (80-113)/(45-72) 80/45 mmHg (10/06 1327) SpO2:  [95 %-98 %] 97 % (10/06 1600)  HEMODYNAMICS:   VENTILATOR SETTINGS:   INTAKE / OUTPUT: Intake/Output     10/06 0701 - 10/07 0700   I.V. (mL/kg) 2466.7 (18.9)   Blood 700   IV Piggyback 250   Total Intake(mL/kg) 3416.7 (26.2)   Urine (mL/kg/hr) 335 (0.1)   Total Output 335   Net +3081.7        PHYSICAL EXAMINATION: General:  Appears acutely ill, mouth-breathing initially on 2L N/C Neuro:  Encephalopathic, nonfocal, cough / gag diminished HEENT:  PERRL Cardiovascular:  RRR, no m/r/g Lungs:  Bilateral diminished air entry, no w/r/r Abdomen:  Soft, nontender, bowel sounds diminished Musculoskeletal:  Moves all extremities, no edema Ext: left upper leg with surgical dressing in place, clean/dry, feet cool to touch bilaterally- Skin:  Intact  LABS:  Recent Labs Lab 01/20/13 0048 01/20/13 0049 01/20/13 0815 01/22/13 0610  HGB  --  11.8*  --  9.2*  WBC  --  6.8  --  11.8*  PLT  --  173  --  162  NA  --  138  --   --   K  --  4.3  --   --   CL  --  102  --   --   CO2  --  23  --   --   GLUCOSE  --  133*  --   --   BUN  --  15  --   --   CREATININE  --  0.86  --   --   CALCIUM  --  8.5  --   --   AST  --  16  --   --   ALT  --  10  --   --   ALKPHOS  --  125*  --   --   BILITOT  --  0.3  --   --   PROT  --  6.6  --    --   ALBUMIN  --  3.1*  --   --   APTT  --  47*  --   --  INR  --  2.50* 1.68*  --   TROPONINI <0.30  --   --   --    No results found for this basename: GLUCAP,  in the last 168 hours  ASSESSMENT / PLAN:  PULMONARY A:  No acute issues. P:   Gaol SpO2>92 Supplemental O2 as needed  CARDIOVASCULAR A: CAD. Atrial flutter. Hx of AAA. Hypotension, likely secondary to general anesthesia and Cardizem. AF/controlled rate off Cardizem in PACU, with MAP of 70. P:  Goal MAM>60 Neosynephrine PRN D/c Cardizem Will use Amiodarone if RVR Preadmission Zocor, ASA, Cozaar, Metoprolol when able  RENAL A:  Normal renal function / electrolytes. P:   Goal CVP>10 Trend BMP NS@100   GASTROINTESTINAL A:  No acute issue. P:   NPO as post-op GI Px is not required, start if vasopressors  HEMATOLOGIC A:  Anemia of acute blood loss.  Coagulopathy. P:  Reversal protocol used prior to OR Hold home Xarelto until restarted by surgery Trend CBC  INFECTIOUS A:  On Ancef, peri-op P:   Continue per surgery  ENDOCRINE  A:  Hx hypothyroidism P:   Continue home Synthroid  NEUROLOGIC A:  Acute encephalopathy.  Protecting airway. Post op pain. P:   Dilaudid / Oxycodone PRN  Bobbye Morton, MD PGY-2, [facility] 01/23/2013, 12:54 AM  I have personally obtained history, examined patient, evaluated and interpreted laboratory and imaging results, reviewed medical records, edited assessment / plan and placed orders.  Lonia Farber, MD Pulmonary and Critical Care Medicine [facility] Pager: [phone]  01/23/2013, 3:02 AM

## 2013-01-23 NOTE — Clinical Social Work Note (Signed)
Clinical Social Work Department BRIEF PSYCHOSOCIAL ASSESSMENT 01/23/2013  Patient:  Austin [name], Austin [name]     Account Number:  [id number]     Admit date:  01/20/2013  Clinical Social Worker:  Verl Blalock  Date/Time:  01/23/2013 04:00 PM  Referred by:  Physician  Date Referred:  01/20/2013 Referred for  SNF Placement   Other Referral:   Interview type:  Patient Other interview type:   No family currently present at bedside    PSYCHOSOCIAL DATA Living Status:  WITH ADULT CHILDREN Admitted from facility:   Level of care:   Primary support name:  Brimage,Kathleen  [phone] Primary support relationship to patient:  CHILD, ADULT Degree of support available:   Strong    CURRENT CONCERNS Current Concerns  Post-Acute Placement   Other Concerns:    SOCIAL WORK ASSESSMENT / PLAN Clinical Social Worker met with patient at bedside to offer support and discuss patient needs at discharge.  Patient states that he lives at home with his adult daughter. Patient has been to [facility] in the past and would like to return if at all possible.  CSW has initiated referral to facility and placed a phone call to notify of patient interest.  Per facility, patient is able to return but no private room availability.  CSW will confirm with patient that he is still agreeable with return and facilitate discharge needs once medically stable.  CSW remains available for support as needed.   Assessment/plan status:  Psychosocial Support/Ongoing Assessment of Needs Other assessment/ plan:   Information/referral to community resources:   Visual merchandiser offered facility list, however patient states that he does not want any other facility outside of [facility]    PATIENT'S/FAMILY'S RESPONSE TO PLAN OF CARE: Patient alert and oriented x3 laying in bed.  Patient with good family support and understanding of SNF placement process.  Patient is agreeable with  plan and seems to have a good sense of humor regarding the need for SNF again. Patient understanding of social work role and appreciative for support and involvement.

## 2013-01-23 NOTE — Progress Notes (Signed)
E-link nurse called and made aware of frequently fluctuation in blood pressure.  Patient arousable and other vitals signs WDL. Will continue to monitor. Austin [name]

## 2013-01-23 NOTE — Progress Notes (Signed)
ANTICOAGULATION CONSULT NOTE - Initial Consult  Pharmacy Consult for Xarelto Indication: atrial fibrillation  No Known Allergies  Patient Measurements: Height: 6\' 5"  (195.6 cm) Weight: 287 lb 14.7 oz (130.6 kg) IBW/kg (Calculated) : 89.1 Vital Signs: Temp: 98.3 F (36.8 C) (10/07 0122) BP: 98/60 mmHg (10/07 0122) Pulse Rate: 67 (10/07 0115)  Labs:  Recent Labs  01/20/13 0815 01/22/13 0610  HGB  --  9.2*  HCT  --  27.4*  PLT  --  162  LABPROT 19.3*  --   INR 1.68*  --   HEPARINUNFRC >2.00*  --     Estimated Creatinine Clearance: 95.6 ml/min (by C-G formula based on Cr of 0.86).   Medical History: Past Medical History  Diagnosis Date  . Hypertension   . Hyperlipidemia   . AAA (abdominal aortic aneurysm)   . Arthritis   . CAD (coronary artery disease)   . Thyroid disease     hypothyroidism  . Dysrhythmia     atrial flutter  . Shortness of breath     a little with exertion  . Polio 1934  . Atrial flutter     chronic anticoagulation  . COPD (chronic obstructive pulmonary disease)   . History of nuclear stress test 04/2010    dipyridamole; small, mostly fixed basal to mid inferior and inferoseptal defect (gut artifact v. scar); post stress EF 62%; non-diagnostic for ischemia; low risk    Assessment: 79 yo male s/p ORIF left femur fracture, h/o Afib, to resume anticoagulation.     Plan:  Will restart Xarelto 20 mg po daily today at 1700  Eddie Candle 01/23/2013,2:12 AM

## 2013-01-23 NOTE — Progress Notes (Signed)
Talked to E-link nurse about hypotension. Will consult with MD and if interventions needed I will be notified. No new orders at this time. Will continue to monitor. Austin [name]

## 2013-01-23 NOTE — Progress Notes (Signed)
Notified E-LINK RN about patient's MAP of 47.  Blood pressure retaken MAP increased to 70.  No further orders received at this time.  Will continue to monitor patient.  Laverna Peace RN

## 2013-01-24 ENCOUNTER — Inpatient Hospital Stay (HOSPITAL_COMMUNITY): Payer: Medicare Other

## 2013-01-24 DIAGNOSIS — I714 Abdominal aortic aneurysm, without rupture: Secondary | ICD-10-CM

## 2013-01-24 DIAGNOSIS — R609 Edema, unspecified: Secondary | ICD-10-CM

## 2013-01-24 LAB — BASIC METABOLIC PANEL
BUN: 14 mg/dL (ref 6–23)
CO2: 23 mEq/L (ref 19–32)
Chloride: 94 mEq/L — ABNORMAL LOW (ref 96–112)
Glucose, Bld: 128 mg/dL — ABNORMAL HIGH (ref 70–99)
Potassium: 3.5 mEq/L (ref 3.5–5.1)

## 2013-01-24 LAB — TYPE AND SCREEN
Antibody Screen: NEGATIVE
Unit division: 0
Unit division: 0
Unit division: 0
Unit division: 0

## 2013-01-24 LAB — CBC
HCT: 22.3 % — ABNORMAL LOW (ref 39.0–52.0)
Hemoglobin: 7.6 g/dL — ABNORMAL LOW (ref 13.0–17.0)
MCHC: 34.1 g/dL (ref 30.0–36.0)
RBC: 2.48 MIL/uL — ABNORMAL LOW (ref 4.22–5.81)
WBC: 9.2 10*3/uL (ref 4.0–10.5)

## 2013-01-24 LAB — PREPARE RBC (CROSSMATCH)

## 2013-01-24 MED ORDER — PANTOPRAZOLE SODIUM 40 MG PO TBEC
40.0000 mg | DELAYED_RELEASE_TABLET | Freq: Every day | ORAL | Status: DC
  Administered 2013-01-25 – 2013-01-27 (×3): 40 mg via ORAL
  Filled 2013-01-24 (×2): qty 1

## 2013-01-24 MED ORDER — FUROSEMIDE 10 MG/ML IJ SOLN
20.0000 mg | Freq: Two times a day (BID) | INTRAMUSCULAR | Status: DC
  Administered 2013-01-24: 20 mg via INTRAVENOUS
  Filled 2013-01-24 (×2): qty 2

## 2013-01-24 MED ORDER — FUROSEMIDE 10 MG/ML IJ SOLN
INTRAMUSCULAR | Status: AC
  Filled 2013-01-24: qty 4

## 2013-01-24 MED ORDER — SODIUM CHLORIDE 0.9 % IV BOLUS (SEPSIS)
500.0000 mL | Freq: Once | INTRAVENOUS | Status: AC
  Administered 2013-01-24: 500 mL via INTRAVENOUS

## 2013-01-24 MED ORDER — ENSURE COMPLETE PO LIQD
237.0000 mL | Freq: Two times a day (BID) | ORAL | Status: DC
  Administered 2013-01-24 – 2013-01-26 (×2): 237 mL via ORAL

## 2013-01-24 MED ORDER — FUROSEMIDE 20 MG PO TABS: 20.0000 mg | ORAL_TABLET | Freq: Two times a day (BID) | ORAL | Status: DC

## 2013-01-24 MED ORDER — POTASSIUM CHLORIDE CRYS ER 20 MEQ PO TBCR
20.0000 meq | EXTENDED_RELEASE_TABLET | ORAL | Status: AC
  Administered 2013-01-24 (×2): 20 meq via ORAL
  Filled 2013-01-24 (×2): qty 1

## 2013-01-24 NOTE — Progress Notes (Signed)
eLink Physician-Brief Progress Note Patient Name: Austin [name] DOB: April 24, 1926 MRN: [id number]  Date of Service  01/24/2013   HPI/Events of Note   Hypotensive, hgb 7.6   eICU Interventions  Transfuse one unit prbc, note is on xarelto.    Intervention Category Major Interventions: Hypotension - evaluation and management  Shan Levans 01/24/2013, 6:56 PM

## 2013-01-24 NOTE — Progress Notes (Signed)
Bilateral lower extremity venous duplex:  No evidence of DVT, superficial thrombosis, or Baker's Cyst.   

## 2013-01-24 NOTE — Progress Notes (Signed)
NUTRITION FOLLOW UP  Intervention:    1. Discussed importance of nutrition with pt.  2. Ensure Complete po BID, each supplement provides 350 kcal and 13 grams of protein.   Nutrition Dx:   Inadequate oral intake now related to poor appetite as evidenced by Meal Completion: <50%; ongoing.   Goal:  PO intake to meet >/=90% estimated nutrition needs, not met.   Monitor:  PO intake, supplement acceptance, weight trends, labs  Assessment:   Pt with recent L hip replacement (6 weeks ago) fell at home resulting in distal femur fracture, but intact hip replacement. Pt reports poor appetite. Per nurse pt has only ate a few bites at meals today. Pt agreeable to ensure.   Height: Ht Readings from Last 1 Encounters:  01/20/13 6\' 5"  (1.956 m)    Weight Status:   Wt Readings from Last 1 Encounters:  01/20/13 287 lb 14.7 oz (130.6 kg)  No new weight Usual weight 287-289  Re-estimated needs:  Kcal: 2200-2400  Protein: 115-130 gm  Fluid: 2.2-2.4 L   Skin: incision left hip  Diet Order: General Meal Completion: 5-20%   Intake/Output Summary (Last 24 hours) at 01/24/13 1521 Last data filed at 01/24/13 1300  Gross per 24 hour  Intake    920 ml  Output   1215 ml  Net   -295 ml    Last BM: 10/5   Labs:   Recent Labs Lab 01/23/13 0442 01/23/13 1017 01/24/13 0510  NA 135 129* 127*  K 4.1 4.0 3.5  CL 101 96 94*  CO2 24 23 23   BUN 17 16 14   CREATININE 0.71 0.70 0.69  CALCIUM 7.4* 7.4* 7.6*  GLUCOSE 131* 136* 128*    CBG (last 3)   Recent Labs  01/23/13 0057  GLUCAP 130*    Scheduled Meds: . antiseptic oral rinse  15 mL Mouth Rinse BID  . aspirin  81 mg Oral Daily  . digoxin  0.125 mg Intravenous Daily  . ferrous sulfate  325 mg Oral TID PC  . furosemide      . furosemide  20 mg Intravenous Q12H  . levothyroxine  75 mcg Oral QAC breakfast  . losartan  50 mg Oral q morning - 10a  . multivitamin with minerals  1 tablet Oral Daily  . pantoprazole  40 mg  Oral Daily  . rivaroxaban  20 mg Oral Q supper  . simvastatin  20 mg Oral q1800  . sodium chloride  10-40 mL Intracatheter Q12H    Continuous Infusions: . sodium chloride 20 mL/hr at 01/24/13 1300  . sodium chloride      Kendell Bane RD, LDN, CNSC [phone] Pager [phone] After Hours Pager

## 2013-01-24 NOTE — Progress Notes (Addendum)
PULMONARY  / CRITICAL CARE MEDICINE  Name: Austin [name] MRN: [id number] DOB: [birth year]    ADMISSION DATE:  01/20/2013 CONSULTATION DATE: 01/23/13  REFERRING MD :  Gaylord Shih PRIMARY SERVICE:  PCCM  CHIEF COMPLAINT:  Post-op hypotension  BRIEF PATIENT DESCRIPTION: 79 yo male with past medical history of HTN, AAA, a-flutter, left hip replacement taken to the OR for left distal femur fracture. Post-op placed on Cardizem gtt, hypotensive. PCCM asked to evaluate.  SIGNIFICANT EVENTS / STUDIES:  10/7  OR for periprosthetic femur fracture, hypotension post-op 10/7- remains tachy, not on Cardizem or neo, dig load 10/8- hypotensive, edema on CXR  LINES / TUBES: Foley 10/7 >>> Right IJ CVL 10/7 >>>  CULTURES: MRSA 10/7>>> Neg  ANTIBIOTICS: Cefazolin 10/7 x 1  SUBJECTIVE: rate controlled, edema on pcxr  VITAL SIGNS: Temp:  [98 F (36.7 C)-100 F (37.8 C)] 98.7 F (37.1 C) (10/08 0400) Pulse Rate:  [72-135] 72 (10/08 0700) Resp:  [15-30] 26 (10/08 0700) BP: (80-111)/(35-63) 82/42 mmHg (10/08 0700) SpO2:  [91 %-98 %] 97 % (10/08 0700)   INTAKE / OUTPUT: Intake/Output     10/07 0701 - 10/08 0700 10/08 0701 - 10/09 0700   P.O. 720    I.V. (mL/kg) 620 (4.7)    Blood     Other 40    IV Piggyback     Total Intake(mL/kg) 1380 (10.6)    Urine (mL/kg/hr) 1295 (0.4)    Total Output 1295     Net +85           PHYSICAL EXAMINATION: General:  Appears acutely ill,on 4L N/C, resting in bed Neuro:  A&Ox3, responds appropriately HEENT: NCAT,  PERRL, productive cough Cardiovascular:  Irregularly, regular, tachycardic Lungs:  Bilateral diminished breath sounds, no use of accessory muscles  Abdomen:  Soft, nontender, + BS Musculoskeletal:  Moves all extremities, 1+ ankle edema, SCD in place on RLE Ext: left upper leg with surgical dressing in place, clean/dry Skin:  Intact, warm and dry   LABS:  Recent Labs Lab 01/20/13 0048  01/20/13 0049 01/20/13 0815  01/23/13 0442  01/23/13 1017 01/23/13 1121 01/23/13 1200 01/24/13 0510  HGB  --   --  11.8*  --   < > 8.6*  --  8.5* 8.6* 7.6*  WBC  --   --  6.8  --   < > 11.7*  --  10.8* 9.8 9.2  PLT  --   --  173  --   < > 132*  --  148* 141* 146*  NA  --   < > 138  --   --  135 129*  --   --  127*  K  --   < > 4.3  --   --  4.1 4.0  --   --  3.5  CL  --   < > 102  --   --  101 96  --   --  94*  CO2  --   < > 23  --   --  24 23  --   --  23  GLUCOSE  --   < > 133*  --   --  131* 136*  --   --  128*  BUN  --   < > 15  --   --  17 16  --   --  14  CREATININE  --   < > 0.86  --   --  0.71 0.70  --   --  0.69  CALCIUM  --   < > 8.5  --   --  7.4* 7.4*  --   --  7.6*  AST  --   --  16  --   --   --   --   --   --   --   ALT  --   --  10  --   --   --   --   --   --   --   ALKPHOS  --   --  125*  --   --   --   --   --   --   --   BILITOT  --   --  0.3  --   --   --   --   --   --   --   PROT  --   --  6.6  --   --   --   --   --   --   --   ALBUMIN  --   --  3.1*  --   --   --   --   --   --   --   APTT  --   --  47*  --   --   --   --   --   --   --   INR  --   --  2.50* 1.68*  --   --   --   --   --   --   TROPONINI <0.30  --   --   --   --   --   --   --   --   --   < > = values in this interval not displayed.  Recent Labs Lab 01/23/13 0057  GLUCAP 130*   CXR 10/8: increasing bilateral edema and bibasilar atelectasis  ASSESSMENT / PLAN:  PULMONARY A:   Risk edema with fib rvr productive cough, afebrile, no sob--sputum culture  P:   -Goal SpO2>92 -Supplemental O2 as needed - post op IS -monitor cbc and fever curve - start lasix -control rate  CARDIOVASCULAR A:  CAD Atrial flutter Hx of AAA Hypotension- likely secondary to general anesthesia and Cardizem, patient fluctuating BP - improved Afib now rvr P:  -Goal MAP>60, met -responded to digoxin, level in 4 days , follow renal fxn closely -Continue preadmission Zocor, ASA, Cozaar, Metoprolol -amio if tachy returns -doppler  legs  RENAL A:  Mild olguria Hyponatremia hypokalemia P:   -kvo -kcl  -lasix  GASTROINTESTINAL/GU A:   No acute issue P:   - continue normal diet  HEMATOLOGIC A:  Anemia of acute blood loss Coagulopathy P:  -Xarelto -Trend CBC in am  -continue Iron  INFECTIOUS A:   No acute issues P:   -monitor cbc and fever curve  -wound care  ENDOCRINE  A:   Hx hypothyroidism P:   -Continue home Synthroid -monitor CBGs  NEUROLOGIC A:   Acute encephalopathy- resolved Post op pain P:   -Dilaudid / Oxycodone PRN- caution with hypotension  -Robaxin PRN - PT when hemodynamics improve - PT scheduled to move to SNF for rehab  Corrina Baglia, [employer]  I have personally obtained history, examined patient, evaluated and interpreted laboratory and imaging results, reviewed medical records, edited assessment / plan and placed orders.  I have fully examined this patient and agree with above findings.    And edited  In full  To sdu  01/24/2013, 7:37 AM  Mcarthur Rossetti. Tyson Alias, MD, FACP Pgr: [phone] [facility]

## 2013-01-24 NOTE — Progress Notes (Signed)
[facility] ADULT ICU REPLACEMENT PROTOCOL FOR AM LAB REPLACEMENT ONLY  The patient does apply for the [facility] Adult ICU Electrolyte Replacment Protocol based on the criteria listed below:   1. Is GFR >/= 40 ml/min? yes  Patient's GFR today is 85 2. Is urine output >/= 0.5 ml/kg/hr for the last 6 hours? yes Patient's UOP is 0.52 ml/kg/hr 3. Is BUN < 60 mg/dL? yes  Patient's BUN today is 14 4. Abnormal electrolyte(s): k 3.5 5. Ordered repletion with: PER PROTOCOL 6. If a panic level lab has been reported, has the CCM MD in charge been notified? no.   Physician:    Markus Daft A 01/24/2013 5:51 AM

## 2013-01-24 NOTE — Progress Notes (Signed)
Pt t/f'd to 3300; Pt stated "feeling better than this morning" after being sleepy majority of the day. Pt alert and oriented, afebrile, with last  BP of 113/52 MAP 60. BP/labs discussed with Dr Tyson Alias during AM rounds; Lasix ordered for pulmonary edema and lower extremity edema. Pt has had fluctuating BP over the past 2 days post surgery with CCM aware.   Cyndie Chime [place]

## 2013-01-24 NOTE — Progress Notes (Signed)
Patient ID: Austin [name], male   DOB: 04/15/1927, 79 y.o.   MRN: [id number] Subjective: 2 Days Post-Op Procedure(s) (LRB): OPEN REDUCTION INTERNAL FIXATION (ORIF) DISTAL FEMUR FRACTURE (Left)    Patient reports pain as mild, particularly without movement.  He is trying to do movement while in bed.  Was OOB to chair yesterday  Objective:   VITALS:   Filed Vitals:   01/24/13 2108  BP: 103/55  Pulse:   Temp: 98.5 F (36.9 C)  Resp:     Neurovascular intact Incision: dressing C/D/I Dressing changed today  LABS  Recent Labs  01/23/13 1121 01/23/13 1200 01/24/13 0510  HGB 8.5* 8.6* 7.6*  HCT 25.1* 24.4* 22.3*  WBC 10.8* 9.8 9.2  PLT 148* 141* 146*     Recent Labs  01/23/13 0442 01/23/13 1017 01/24/13 0510  NA 135 129* 127*  K 4.1 4.0 3.5  BUN 17 16 14   CREATININE 0.71 0.70 0.69  GLUCOSE 131* 136* 128*    No results found for this basename: LABPT, INR,  in the last 72 hours   Assessment/Plan: 2 Days Post-Op Procedure(s) (LRB): OPEN REDUCTION INTERNAL FIXATION (ORIF) DISTAL FEMUR FRACTURE (Left)   Advance diet Up with therapy Discharge to SNF when medically stable, out of unit today

## 2013-01-24 NOTE — Progress Notes (Signed)
Pt tx from 3100, pt diaphoretic,shivering, c/o not feeling good, BP 93/50, map 55, MD/N new orders received, nursing will cont to monitor

## 2013-01-24 NOTE — Progress Notes (Signed)
Physical Therapy Treatment Patient Details Name: Austin [name] MRN: [id number] DOB: 1926/09/22 Today's Date: 01/24/2013 Time: 4098-1191 PT Time Calculation (min): 36 min  PT Assessment / Plan / Recommendation  History of Present Illness Pt s/p ORIF to L distal femur fx. 79 y.o. male who just had a L hip replacement 6 weeks ago (also has h/o L knee replacement 9 years ago), who unfortunately suffered a fall when his leg gave out on him (no LOC).  Unfortunately he landed on his L leg.  Leg was grossly deformed and movement very painful, (pain worse with movement, better with rest).   PT Comments   Pt not feeling well this date with increased L LE pain and fatigue. Pt unable to achieve full upright standing or OOB to chair this date. Pt con't to benefit from SNF upon d/c from hospital to progress mobility as able.   Follow Up Recommendations  SNF     Does the patient have the potential to tolerate intense rehabilitation     Barriers to Discharge        Equipment Recommendations  None recommended by PT    Recommendations for Other Services    Frequency Min 5X/week   Progress towards PT Goals Progress towards PT goals: Progressing toward goals  Plan Current plan remains appropriate    Precautions / Restrictions Precautions Precautions: Posterior Hip;Fall Restrictions Weight Bearing Restrictions: Yes LLE Weight Bearing: Non weight bearing   Pertinent Vitals/Pain 9/10 L LE pain with PT    Mobility  Bed Mobility Bed Mobility: Supine to Sit;Sitting - Scoot to Edge of Bed Supine to Sit: 1: +2 Total assist;HOB elevated Supine to Sit: Patient Percentage: 30% Sitting - Scoot to Edge of Bed: 1: +2 Total assist Sitting - Scoot to Edge of Bed: Patient Percentage: 30% Details for Bed Mobility Assistance: pt with increased pain and fatigue this date requiring increased assist Transfers Transfers: Sit to Stand;Stand to Sit Sit to Stand: 1: +2 Total assist;With upper extremity  assist;From bed Sit to Stand: Patient Percentage: 20% Stand to Sit: 1: +2 Total assist;Without upper extremity assist;To chair/3-in-1 Stand to Sit: Patient Percentage: 20% Details for Transfer Assistance: attempted 3 times prior to achieving full upright position this date. pt c/o "My R leg just can't hold me today." Pt diapheretic with BP at 96/48. RN present and aware. Pt unable to maintain L LE NWB. Ambulation/Gait Ambulation/Gait Assistance: Not tested (comment)    Exercises Total Joint Exercises Ankle Circles/Pumps: AROM;Both;10 reps;Supine Quad Sets: AROM;Left;10 reps;Supine Long Arc Quad: AROM;Right;10 reps;Seated Knee Flexion: AROM;Right;10 reps;Supine   PT Diagnosis:    PT Problem List:   PT Treatment Interventions:     PT Goals (current goals can now be found in the care plan section)    Visit Information  Assistance Needed: +2 History of Present Illness: Pt s/p ORIF to L distal femur fx. 79 y.o. male who just had a L hip replacement 6 weeks ago (also has h/o L knee replacement 9 years ago), who unfortunately suffered a fall when his leg gave out on him (no LOC).  Unfortunately he landed on his L leg.  Leg was grossly deformed and movement very painful, (pain worse with movement, better with rest).    Subjective Data  Subjective: Pt with report "I'm not feeling as good today"   Cognition  Cognition Arousal/Alertness: Awake/alert Behavior During Therapy: WFL for tasks assessed/performed Overall Cognitive Status: Within Functional Limits for tasks assessed    Balance  Balance Balance Assessed: Yes  Static Sitting Balance Static Sitting - Balance Support: Bilateral upper extremity supported;Feet supported Static Sitting - Level of Assistance: 4: Min assist Static Sitting - Comment/# of Minutes: 6 min Static Standing Balance Static Standing - Balance Support: Bilateral upper extremity supported Static Standing - Level of Assistance: 1: +2 Total assist Static Standing  - Comment/# of Minutes: 30 sec. R knee blocked, pt unable to tolerate/maintain this date compared to yesterday  End of Session PT - End of Session Equipment Utilized During Treatment: Gait belt;Oxygen Activity Tolerance: Patient limited by fatigue;Patient limited by pain Patient left: in bed;with call bell/phone within reach Nurse Communication: Mobility status;Need for lift equipment   GP     Marcene Brawn 01/24/2013, 3:34 PM  Lewis Shock, PT, DPT Pager #: [phone] Office #: [phone]

## 2013-01-24 NOTE — Clinical Social Work Note (Signed)
Clinical Social Worker continuing to follow patient for support and discharge planning needs.  [facility] has extended a bed offer for patient return once medically ready.  CSW spoke with patient at bedside who is agreeable with semi-private room.  CSW has attempted to contact facility to accept bed offer - await return call.  CSW remains available for support and to facilitate patient discharge needs once medically stable.  Macario Golds, [place] [phone]

## 2013-01-25 ENCOUNTER — Inpatient Hospital Stay (HOSPITAL_COMMUNITY): Payer: Medicare Other

## 2013-01-25 LAB — GLUCOSE, CAPILLARY: Glucose-Capillary: 138 mg/dL — ABNORMAL HIGH (ref 70–99)

## 2013-01-25 LAB — BASIC METABOLIC PANEL WITH GFR
BUN: 15 mg/dL (ref 6–23)
CO2: 25 meq/L (ref 19–32)
Calcium: 7.5 mg/dL — ABNORMAL LOW (ref 8.4–10.5)
Chloride: 99 meq/L (ref 96–112)
Creatinine, Ser: 0.62 mg/dL (ref 0.50–1.35)
GFR calc Af Amer: >90 mL/min
GFR calc non Af Amer: 89 mL/min — ABNORMAL LOW
Glucose, Bld: 121 mg/dL — ABNORMAL HIGH (ref 70–99)
Potassium: 3.2 meq/L — ABNORMAL LOW (ref 3.5–5.1)
Sodium: 133 meq/L — ABNORMAL LOW (ref 135–145)

## 2013-01-25 LAB — CBC
Hemoglobin: 8.5 g/dL — ABNORMAL LOW (ref 13.0–17.0)
MCH: 30.9 pg (ref 26.0–34.0)
MCHC: 35 g/dL (ref 30.0–36.0)
Platelets: 150 10*3/uL (ref 150–400)
RBC: 2.75 MIL/uL — ABNORMAL LOW (ref 4.22–5.81)
RDW: 14.7 % (ref 11.5–15.5)
WBC: 9.5 10*3/uL (ref 4.0–10.5)

## 2013-01-25 LAB — TYPE AND SCREEN: Antibody Screen: NEGATIVE

## 2013-01-25 LAB — MAGNESIUM: Magnesium: 2 mg/dL (ref 1.5–2.5)

## 2013-01-25 LAB — PHOSPHORUS: Phosphorus: 1.8 mg/dL — ABNORMAL LOW (ref 2.3–4.6)

## 2013-01-25 MED ORDER — POTASSIUM PHOSPHATE DIBASIC 3 MMOLE/ML IV SOLN
30.0000 mmol | Freq: Once | INTRAVENOUS | Status: AC
  Administered 2013-01-25: 30 mmol via INTRAVENOUS
  Filled 2013-01-25: qty 10

## 2013-01-25 MED ORDER — FENTANYL CITRATE 0.05 MG/ML IJ SOLN
12.5000 ug | INTRAMUSCULAR | Status: DC | PRN
  Administered 2013-01-25: 12.5 ug via INTRAVENOUS

## 2013-01-25 MED ORDER — POTASSIUM CHLORIDE CRYS ER 20 MEQ PO TBCR
40.0000 meq | EXTENDED_RELEASE_TABLET | Freq: Once | ORAL | Status: AC
  Administered 2013-01-25: 40 meq via ORAL
  Filled 2013-01-25: qty 2

## 2013-01-25 MED ORDER — FENTANYL CITRATE 0.05 MG/ML IJ SOLN
INTRAMUSCULAR | Status: AC
  Filled 2013-01-25: qty 2

## 2013-01-25 NOTE — Progress Notes (Signed)
Occupational Therapy Treatment Patient Details Name: Austin [name] MRN: [id number] DOB: [birth year] Today's Date: 01/25/2013 Time: 0820-0902 OT Time Calculation (min): 42 min  OT Assessment / Plan / Recommendation  History of present illness Pt s/p ORIF to L distal femur fx. 79 y.o. male who just had a L hip replacement 6 weeks ago (also has h/o L knee replacement 9 years ago), who unfortunately suffered a fall when his leg gave out on him (no LOC).  Unfortunately he landed on his L leg.  Leg was grossly deformed and movement very painful, (pain worse with movement, better with rest).   OT comments  Pt very limited due to pain and overall fatigue (limited pain meds available due to low BP). Pt attempted to perform sit<>stand with +2 total assist but unable to successfully obtain full upright posture.  Will continue to follow acutely.  Follow Up Recommendations  SNF    Barriers to Discharge       Equipment Recommendations  None recommended by OT    Recommendations for Other Services    Frequency Min 2X/week   Progress towards OT Goals Progress towards OT goals: Progressing toward goals  Plan Discharge plan remains appropriate    Precautions / Restrictions Precautions Precautions: Posterior Hip;Fall Restrictions Weight Bearing Restrictions: Yes LLE Weight Bearing: Non weight bearing   Pertinent Vitals/Pain See vitals   ADL  Eating/Feeding: Performed;Set up Where Assessed - Eating/Feeding: Bed level Upper Body Dressing: Performed;Minimal assistance Where Assessed - Upper Body Dressing: Supported sitting Lower Body Dressing: Performed;+1 Total assistance (donning socks) Where Assessed - Lower Body Dressing: Supported sitting Equipment Used: Gait belt Transfers/Ambulation Related to ADLs: Pt unable to obtain full upright standing posture with +2 total assist but able to lift off bed enough for PT/OT to remove soiled linens and place clean sheet beneath pt. ADL Comments: Pt's  with soiled linens and gown upon OT/PT arrival.  Pt with significant pain with all aspects of bed mobility and while sitting EOB.  Attempted sit<>stand but was unsuccessful. Pt able to lift off bed enough for PT/OT to place clean linens on bed. Assist pt with donning clean gown as well.     OT Diagnosis:    OT Problem List:   OT Treatment Interventions:     OT Goals(current goals can now be found in the care plan section) Acute Rehab OT Goals Patient Stated Goal: to get to [facility] OT Goal Formulation: With patient Time For Goal Achievement: 02/06/13 Potential to Achieve Goals: Good ADL Goals Pt Will Transfer to Toilet: with mod assist;bedside commode;stand pivot transfer Pt Will Perform Toileting - Clothing Manipulation and hygiene: sitting/lateral leans;with min guard assist Pt/caregiver will Perform Home Exercise Program: Increased strength;Both right and left upper extremity;With theraband Additional ADL Goal #1: Bed mobility with min A in preparation for ADL  Visit Information  Last OT Received On: 01/25/13 Assistance Needed: +3 or more History of Present Illness: Pt s/p ORIF to L distal femur fx. 79 y.o. male who just had a L hip replacement 6 weeks ago (also has h/o L knee replacement 9 years ago), who unfortunately suffered a fall when his leg gave out on him (no LOC).  Unfortunately he landed on his L leg.  Leg was grossly deformed and movement very painful, (pain worse with movement, better with rest).    Subjective Data      Prior Functioning       Cognition  Cognition Arousal/Alertness: Awake/alert Behavior During Therapy: WFL for tasks assessed/performed  Overall Cognitive Status: Within Functional Limits for tasks assessed    Mobility  Bed Mobility Bed Mobility: Supine to Sit;Sitting - Scoot to Edge of Bed;Sit to Supine Supine to Sit: 1: +2 Total assist;HOB elevated Supine to Sit: Patient Percentage: 30% Sitting - Scoot to Edge of Bed: 1: +2 Total  assist Sitting - Scoot to Edge of Bed: Patient Percentage: 30% Sit to Supine: 1: +2 Total assist;HOB flat Sit to Supine: Patient Percentage: 20% Details for Bed Mobility Assistance: Pt limited by fatigue and LLE pain. Incr time for all aspects of bed mobility. Transfers Transfers: Sit to Stand;Stand to Sit Sit to Stand: 1: +2 Total assist;With upper extremity assist;From bed Sit to Stand: Patient Percentage: 10% Stand to Sit: 1: +2 Total assist;Without upper extremity assist;To chair/3-in-1 Stand to Sit: Patient Percentage: 10% Details for Transfer Assistance: Attempted to stand however pt unable to complete upright posture.  Very limited due to pain.     Exercises      Balance Balance Balance Assessed: Yes Static Sitting Balance Static Sitting - Balance Support: Bilateral upper extremity supported;Feet supported Static Sitting - Level of Assistance: 4: Min assist;5: Stand by assistance Static Sitting - Comment/# of Minutes: Pt initally required assist for upright posture due to right lateral lean in order to avoid putting weight through left hip. Pt eventually able to maintain balance (still with right lateral lean using rail) with stand by assist while sitting EOB 10 minutes.   End of Session OT - End of Session Equipment Utilized During Treatment: Gait belt Activity Tolerance: Patient limited by fatigue;Patient limited by pain Patient left: in bed;with call bell/phone within reach;with nursing/sitter in room Nurse Communication: Mobility status;Patient requests pain meds  GO    01/25/2013 Cipriano Mile OTR/L Pager [phone] Office [phone]  Cipriano Mile 01/25/2013, 2:07 PM

## 2013-01-25 NOTE — Progress Notes (Signed)
Physical Therapy Treatment Patient Details Name: Austin [name] MRN: [id number] DOB: 09-Dec-1926 Today's Date: 01/25/2013 Time: 0820-0900 PT Time Calculation (min): 40 min  PT Assessment / Plan / Recommendation  History of Present Illness Pt s/p ORIF to L distal femur fx. 79 y.o. male who just had a L hip replacement 6 weeks ago (also has h/o L knee replacement 9 years ago), who unfortunately suffered a fall when his leg gave out on him (no LOC).  Unfortunately he landed on his L leg.  Leg was grossly deformed and movement very painful, (pain worse with movement, better with rest).   PT Comments   Pt very limited due to overall fatigue and increase pain with mobility due to limited pain meds due to low BP.  Attempted to stand however pt unable to complete therefore return to supine.  Plan for next session to complete lateral scoot with drop arm chair to get OOB.  Pt reports lift too painful due to incisional pain.    Follow Up Recommendations  SNF     Equipment Recommendations  None recommended by PT    Frequency Min 5X/week   Progress towards PT Goals Progress towards PT goals: Progressing toward goals  Plan Current plan remains appropriate    Precautions / Restrictions Precautions Precautions: Posterior Hip;Fall Restrictions Weight Bearing Restrictions: Yes LLE Weight Bearing: Non weight bearing (left LE)   Pertinent Vitals/Pain No pain initially however pain increased to 8/10 left LE with mobility    Mobility  Bed Mobility Bed Mobility: Supine to Sit;Sitting - Scoot to Edge of Bed Supine to Sit: 1: +2 Total assist;HOB elevated Supine to Sit: Patient Percentage: 30% Sitting - Scoot to Edge of Bed: 1: +2 Total assist Sitting - Scoot to Edge of Bed: Patient Percentage: 30% Details for Bed Mobility Assistance: Pt limited due to overall fatigue and pain in left LE.  Pt needs extra time to complete task with max cues for technique. Transfers Transfers: Sit to Stand;Stand to  Sit Sit to Stand: 1: +2 Total assist;With upper extremity assist;From bed Sit to Stand: Patient Percentage: 10% Stand to Sit: 1: +2 Total assist;Without upper extremity assist;To chair/3-in-1 Stand to Sit: Patient Percentage: 10% Details for Transfer Assistance: Attempted to stand however pt unable to complete upright posture.  Very limited due to pain.  Ambulation/Gait Ambulation/Gait Assistance: Not tested (comment)    Exercises Total Joint Exercises Ankle Circles/Pumps: AROM;Both;10 reps;Supine   PT Diagnosis:    PT Problem List:   PT Treatment Interventions:     PT Goals (current goals can now be found in the care plan section) Acute Rehab PT Goals Patient Stated Goal: to get to [facility] PT Goal Formulation: With patient Time For Goal Achievement: 02/06/13 Potential to Achieve Goals: Good  Visit Information  Last PT Received On: 01/25/13 Assistance Needed: +3 or more PT/OT Co-Evaluation/Treatment: Yes History of Present Illness: Pt s/p ORIF to L distal femur fx. 79 y.o. male who just had a L hip replacement 6 weeks ago (also has h/o L knee replacement 9 years ago), who unfortunately suffered a fall when his leg gave out on him (no LOC).  Unfortunately he landed on his L leg.  Leg was grossly deformed and movement very painful, (pain worse with movement, better with rest).    Subjective Data  Subjective: Pt with report "I'm not feeling as good today" Patient Stated Goal: to get to [facility]   Cognition  Cognition Arousal/Alertness: Awake/alert Behavior During Therapy: [facility] for tasks  assessed/performed Overall Cognitive Status: Within Functional Limits for tasks assessed    Balance  Balance Balance Assessed: Yes Static Sitting Balance Static Sitting - Balance Support: Bilateral upper extremity supported;Feet supported Static Sitting - Level of Assistance: 4: Min assist;5: Stand by assistance Static Sitting - Comment/# of Minutes: Initial min (A) to maintain  upright posture.  Pt continues to stay leaned onto right UE holding onto rail.  Pt sat EOB for ~  End of Session PT - End of Session Equipment Utilized During Treatment: Gait belt;Oxygen Activity Tolerance: Patient limited by fatigue;Patient limited by pain Patient left: in bed;with call bell/phone within reach Nurse Communication: Mobility status;Need for lift equipment   GP     Austin [name] 01/25/2013, 11:08 AM  Jake Shark, PT DPT [phone]

## 2013-01-25 NOTE — Progress Notes (Signed)
eLink Physician-Brief Progress Note Patient Name: HILLEL CARD DOB: 09/23/1926 MRN: [id number]  Date of Service  01/25/2013   HPI/Events of Note  Hypokalemia and hypophosphatemia  eICU Interventions  Potassium and Phos replaced   Intervention Category Intermediate Interventions: Electrolyte abnormality - evaluation and management  Nicholad Kautzman 01/25/2013, 5:13 AM

## 2013-01-25 NOTE — Progress Notes (Signed)
Orthopedic Tech Progress Note Patient Details:  Austin [name] Oct 20, 1926 [id number]  Patient ID: Austin [name], male   DOB: 1926/06/29, 79 y.o.   MRN: [id number]   Austin [name] 01/25/2013, 1:20 PMTrapeze bar

## 2013-01-25 NOTE — Clinical Social Work Note (Signed)
CSW spoke with [facility] admission coordinator. [facility] stated that they are prepared to accept the pt as a resident as soon as pt is medically discharged from [facility]. CSW to continue to follow and assist with discharge planning needs.  Darlyn Chamber, MSW, LCSWA Clinical Social Work [phone]

## 2013-01-25 NOTE — Progress Notes (Signed)
Utilization review completed.  

## 2013-01-25 NOTE — Progress Notes (Signed)
PULMONARY  / CRITICAL CARE MEDICINE  Name: Austin [name] MRN: [id number] DOB: 1926-10-31    ADMISSION DATE:  01/20/2013 CONSULTATION DATE: 01/23/13  REFERRING MD :  Gaylord Shih PRIMARY SERVICE:  PCCM  CHIEF COMPLAINT:  Post-op hypotension  BRIEF PATIENT DESCRIPTION: 79 yo male with past medical history of HTN, AAA, a-flutter, left hip replacement taken to the OR for left distal femur fracture. Post-op placed on Cardizem gtt, hypotensive. PCCM asked to evaluate.  SIGNIFICANT EVENTS / STUDIES:  10/7  OR for periprosthetic femur fracture, hypotension post-op 10/7- remains tachy, not on Cardizem or neo, dig load 10/8- hypotensive, edema on CXR 10/8 - Bilateral duplex U/S LE no DVT or superficial thrombosis 10/9- drop in BP lasix associated?  LINES / TUBES: Foley 10/7 >>> Right IJ CVL 10/7 >>>  CULTURES: MRSA 10/7>>> Neg  ANTIBIOTICS: Cefazolin 10/7 x 1  SUBJECTIVE: rate controlled, edema on pcxr improved  VITAL SIGNS: Temp:  [97.6 F (36.4 C)-98.5 F (36.9 C)] 98 F (36.7 C) (10/09 0800) Pulse Rate:  [64-109] 72 (10/09 0800) Resp:  [10-31] 10 (10/09 0800) BP: (87-118)/(42-63) 108/59 mmHg (10/09 0800) SpO2:  [94 %-99 %] 97 % (10/09 0800)   INTAKE / OUTPUT: Intake/Output     10/08 0701 - 10/09 0700 10/09 0701 - 10/10 0700   P.O. 1080    I.V. (mL/kg) 200 (1.5)    Blood 425    Other     Total Intake(mL/kg) 1705 (13.1)    Urine (mL/kg/hr) 870 (0.3) 400 (1.1)   Total Output 870 400   Net +835 -400        Stool Occurrence 1 x     PHYSICAL EXAMINATION: General:  Appears acutely ill,on 4L N/C, resting in bed Neuro:  A&Ox3, responds appropriately HEENT: jvd present Cardiovascular:  Irregularly, regular s1 s 2 Lungs:  Coarse slight bases  Abdomen:  Soft, nontender, + BS Musculoskeletal:  Moves all extremities, edema 1 plus Ext: left upper leg with surgical dressing in place, clean/dry Skin:  Intact, warm and dry   LABS:  Recent Labs Lab 01/20/13 0048  01/20/13 0049 01/20/13 0815  01/23/13 1017  01/23/13 1200 01/24/13 0510 01/25/13 0425  HGB  --  11.8*  --   < >  --   < > 8.6* 7.6* 8.5*  WBC  --  6.8  --   < >  --   < > 9.8 9.2 9.5  PLT  --  173  --   < >  --   < > 141* 146* 150  NA  --  138  --   < > 129*  --   --  127* 133*  K  --  4.3  --   < > 4.0  --   --  3.5 3.2*  CL  --  102  --   < > 96  --   --  94* 99  CO2  --  23  --   < > 23  --   --  23 25  GLUCOSE  --  133*  --   < > 136*  --   --  128* 121*  BUN  --  15  --   < > 16  --   --  14 15  CREATININE  --  0.86  --   < > 0.70  --   --  0.69 0.62  CALCIUM  --  8.5  --   < > 7.4*  --   --  7.6* 7.5*  MG  --   --   --   --   --   --   --   --  2.0  PHOS  --   --   --   --   --   --   --   --  1.8*  AST  --  16  --   --   --   --   --   --   --   ALT  --  10  --   --   --   --   --   --   --   ALKPHOS  --  125*  --   --   --   --   --   --   --   BILITOT  --  0.3  --   --   --   --   --   --   --   PROT  --  6.6  --   --   --   --   --   --   --   ALBUMIN  --  3.1*  --   --   --   --   --   --   --   APTT  --  47*  --   --   --   --   --   --   --   INR  --  2.50* 1.68*  --   --   --   --   --   --   TROPONINI <0.30  --   --   --   --   --   --   --   --   < > = values in this interval not displayed.  Recent Labs Lab 01/23/13 0057  GLUCAP 130*   CXR 10/9: edema, improved aeration bases  ASSESSMENT / PLAN:  PULMONARY A:   Pulm edema from prior afib rvr P:   -Goal SpO2>92 -Supplemental O2 as needed - post op IS -hold lasix with BP drop last night  CARDIOVASCULAR A:  CAD Atrial flutter Hx of AAA Hypotension- lasix , anti htn related? Afib now rvr-rate controlled 10/8 P:  -responded to digoxin, level in 4 days (ordered for sat)assess renal fxn closely, he is clinically therapeutic now -Continue preadmission Zocor, ASA -metoprolol, cozaar -amio if tachy returns -doppler legs - neg -continue xaralto  RENAL A:   Hyponatremia Hypokalemia Hypophosphatemia  P:   -kvo and lytes again in am  -kcl , k phos -lasix on hold x 24 hrs , likely restart in am   GASTROINTESTINAL/GU A:   No acute issue P:   - continue normal diet  HEMATOLOGIC A:  Anemia of acute blood loss- pt received 1 U PRBC 10/8 for hgb 7.6  Dilution anemia a  concern Coagulopathy P:  -Xarelto -Trend CBC in am  -continue Iron -consider additional transfusion if Hgb drops <7 -lasix likely in am    INFECTIOUS A:   No acute issues P:   -monitor cbc and fever curve  -wound care  ENDOCRINE  A:   Hx hypothyroidism P:   -Continue home Synthroid -monitor CBGs  NEUROLOGIC A:   Acute encephalopathy- resolved Post op pain P:   -Dilaudid / Oxycodone PRN- caution with hypotension  -Robaxin PRN - PT when hemodynamics improve - PT scheduled to move to SNF for rehab  Corrina Baglia, [employer]  I have personally obtained history, examined  patient, evaluated and interpreted laboratory and imaging results, reviewed medical records, edited assessment / plan and placed orders.  I have fully examined this patient and agree with above findings.    And edited  In full   01/25/2013, 9:44 AM  Mcarthur Rossetti. Tyson Alias, MD, FACP Pgr: [phone] [facility]

## 2013-01-26 ENCOUNTER — Inpatient Hospital Stay (HOSPITAL_COMMUNITY): Payer: Medicare Other

## 2013-01-26 ENCOUNTER — Encounter (HOSPITAL_COMMUNITY): Payer: Self-pay | Admitting: Orthopedic Surgery

## 2013-01-26 LAB — BASIC METABOLIC PANEL
BUN: 12 mg/dL (ref 6–23)
Calcium: 7.8 mg/dL — ABNORMAL LOW (ref 8.4–10.5)
Creatinine, Ser: 0.6 mg/dL (ref 0.50–1.35)
GFR calc Af Amer: >90 mL/min (ref >90)
GFR calc non Af Amer: 90 mL/min — ABNORMAL LOW (ref >90)
Glucose, Bld: 108 mg/dL — ABNORMAL HIGH (ref 70–99)
Potassium: 3.3 mEq/L — ABNORMAL LOW (ref 3.5–5.1)
Sodium: 132 mEq/L — ABNORMAL LOW (ref 135–145)

## 2013-01-26 LAB — MAGNESIUM: Magnesium: 2 mg/dL (ref 1.5–2.5)

## 2013-01-26 LAB — CBC
HCT: 25.4 % — ABNORMAL LOW (ref 39.0–52.0)
Hemoglobin: 8.7 g/dL — ABNORMAL LOW (ref 13.0–17.0)
MCH: 30.9 pg (ref 26.0–34.0)
MCHC: 34.3 g/dL (ref 30.0–36.0)
Platelets: 198 10*3/uL (ref 150–400)
RDW: 15.1 % (ref 11.5–15.5)

## 2013-01-26 MED ORDER — ZOLPIDEM TARTRATE 5 MG PO TABS
5.0000 mg | ORAL_TABLET | Freq: Every evening | ORAL | Status: DC | PRN
  Administered 2013-01-26: 5 mg via ORAL
  Filled 2013-01-26: qty 1

## 2013-01-26 MED ORDER — POTASSIUM CHLORIDE CRYS ER 20 MEQ PO TBCR
40.0000 meq | EXTENDED_RELEASE_TABLET | Freq: Once | ORAL | Status: AC
  Administered 2013-01-26: 40 meq via ORAL
  Filled 2013-01-26: qty 2

## 2013-01-26 MED ORDER — POTASSIUM PHOSPHATE DIBASIC 3 MMOLE/ML IV SOLN
30.0000 mmol | Freq: Once | INTRAVENOUS | Status: AC
  Administered 2013-01-26: 30 mmol via INTRAVENOUS
  Filled 2013-01-26: qty 10

## 2013-01-26 NOTE — Progress Notes (Signed)
eLink Physician-Brief Progress Note Patient Name: Austin [name] DOB: [birth year] MRN: [id number]  Date of Service  01/26/2013   HPI/Events of Note  Hypokalemia and hypophos   eICU Interventions  Potassium and phos replaced   Intervention Category Intermediate Interventions: Electrolyte abnormality - evaluation and management  DETERDING,ELIZABETH 01/26/2013, 5:21 AM

## 2013-01-26 NOTE — Progress Notes (Signed)
Patient ID: Austin [name], male   DOB: 29-Jul-1926, 79 y.o.   MRN: [id number] Subjective: 4 Days Post-Op Procedure(s) (LRB): OPEN REDUCTION INTERNAL FIXATION (ORIF) DISTAL FEMUR FRACTURE (Left)    Patient reports pain as moderate.  Minimal activity.  Difficult time even with transfers given limitations of weight bearing on the left  Objective:   VITALS:   Filed Vitals:   01/26/13 0400  BP: 121/54  Pulse: 79  Temp: 97.9 F (36.6 C)  Resp: 22    Neurovascular intact Incision: dressing C/D/I  LABS  Recent Labs  01/24/13 0510 01/25/13 0425 01/26/13 0439  HGB 7.6* 8.5* 8.7*  HCT 22.3* 24.3* 25.4*  WBC 9.2 9.5 8.9  PLT 146* 150 198     Recent Labs  01/24/13 0510 01/25/13 0425 01/26/13 0439  NA 127* 133* 132*  K 3.5 3.2* 3.3*  BUN 14 15 12   CREATININE 0.69 0.62 0.60  GLUCOSE 128* 121* 108*    No results found for this basename: LABPT, INR,  in the last 72 hours   Assessment/Plan: 4 Days Post-Op Procedure(s) (LRB): OPEN REDUCTION INTERNAL FIXATION (ORIF) DISTAL FEMUR FRACTURE (Left)   Up with therapy, progress as much as possible  In step down now out of unit To floor once stable D/C plan pending further medical stability

## 2013-01-26 NOTE — Progress Notes (Signed)
Physical Therapy Treatment Patient Details Name: Austin [name] MRN: [id number] DOB: [birth year] Today's Date: 01/26/2013 Time: 8295-6213 PT Time Calculation (min): 26 min  PT Assessment / Plan / Recommendation  History of Present Illness Pt s/p ORIF to L distal femur fx. 79 y.o. male who just had a L hip replacement 6 weeks ago (also has h/o L knee replacement 9 years ago), who unfortunately suffered a fall when his leg gave out on him (no LOC).  Unfortunately he landed on his L leg.  Leg was grossly deformed and movement very painful, (pain worse with movement, better with rest).   PT Comments   Pt able to perform lateral transfer with +2 (A) however pt able to (A) with UE to advance hips from bed to recliner.    Follow Up Recommendations  SNF     Equipment Recommendations  None recommended by PT    Frequency Min 5X/week   Progress towards PT Goals Progress towards PT goals: Progressing toward goals  Plan Current plan remains appropriate    Precautions / Restrictions Precautions Precautions: Posterior Hip;Fall Restrictions Weight Bearing Restrictions: Yes LLE Weight Bearing: Non weight bearing   Pertinent Vitals/Pain C/o left hip pain but does not rate    Mobility  Bed Mobility Bed Mobility: Supine to Sit;Sitting - Scoot to Edge of Bed;Sit to Supine Supine to Sit: 1: +2 Total assist;HOB elevated Supine to Sit: Patient Percentage: 40% Sitting - Scoot to Edge of Bed: 1: +2 Total assist Sitting - Scoot to Edge of Bed: Patient Percentage: 30% Details for Bed Mobility Assistance: (A) with trunk and left LE OOB with max cues for technique.  Transfers Transfers: Lateral/Scoot Transfers Sit to Stand: 1: +2 Total assist;With upper extremity assist;From bed Sit to Stand: Patient Percentage: 50% Details for Transfer Assistance: Pt performed lateral transfer and able to assist with UE with therapist used pad to (A) hips.   Ambulation/Gait Ambulation/Gait Assistance: Not tested  (comment)    Exercises Total Joint Exercises Ankle Circles/Pumps: AROM;Both;10 reps;Supine Quad Sets: AROM;Left;10 reps;Supine Heel Slides: AAROM;Strengthening;Left;10 reps   PT Diagnosis:    PT Problem List:   PT Treatment Interventions:     PT Goals (current goals can now be found in the care plan section) Acute Rehab PT Goals Patient Stated Goal: To go to [facility] to get therapy.  PT Goal Formulation: With patient Potential to Achieve Goals: Good  Visit Information  Last PT Received On: 01/26/13 Assistance Needed: +3 or more History of Present Illness: Pt s/p ORIF to L distal femur fx. 79 y.o. male who just had a L hip replacement 6 weeks ago (also has h/o L knee replacement 9 years ago), who unfortunately suffered a fall when his leg gave out on him (no LOC).  Unfortunately he landed on his L leg.  Leg was grossly deformed and movement very painful, (pain worse with movement, better with rest).    Subjective Data  Subjective: "Do you want the honest truth? No I'm not doing alright." Patient Stated Goal: To go to [facility] to get therapy.    Cognition  Cognition Arousal/Alertness: Awake/alert Behavior During Therapy: WFL for tasks assessed/performed Overall Cognitive Status: Within Functional Limits for tasks assessed    Balance     End of Session PT - End of Session Equipment Utilized During Treatment: Gait belt;Oxygen Activity Tolerance: Patient limited by fatigue;Patient limited by pain Patient left: in chair;with call bell/phone within reach Nurse Communication: Mobility status   GP     Austin [name] 01/26/2013,  4:40 PM  [place], [place] DPT [phone]

## 2013-01-26 NOTE — Progress Notes (Signed)
Pt. trasnferred to room 3w03; vss, pt. Alert and oriented; patient belongings sent with patient.  Report given to [place], Charity fundraiser.  Vivi Martens RN

## 2013-01-26 NOTE — Progress Notes (Signed)
PULMONARY  / CRITICAL CARE MEDICINE  Name: Austin [name] MRN: [id number] DOB: 20-Mar-1927    ADMISSION DATE:  01/20/2013 CONSULTATION DATE: 01/23/13  REFERRING MD :  Gaylord Shih PRIMARY SERVICE:  PCCM  CHIEF COMPLAINT:  Post-op hypotension  BRIEF PATIENT DESCRIPTION: 79 yo male with past medical history of HTN, AAA, a-flutter, left hip replacement taken to the OR for left distal femur fracture. Post-op placed on Cardizem gtt, hypotensive. PCCM consulted for evaluation and management.   SIGNIFICANT EVENTS / STUDIES:  10/7  OR for periprosthetic femur fracture, hypotension post-op 10/7- remains tachy, not on Cardizem or neo, dig load 10/8- hypotensive, edema on CXR 10/8 - Bilateral duplex U/S LE no DVT or superficial thrombosis 10/9- drop in BP lasix associated? 10/10 -  Remains rate controlled  LINES / TUBES: Foley 10/7 >>> Right IJ CVL 10/7 >>>  CULTURES: MRSA 10/7>>> Neg  ANTIBIOTICS: Cefazolin 10/7 x 1  SUBJECTIVE: rate controlled, edema on pcxr improved, pain in left leg  VITAL SIGNS: Temp:  [97.6 F (36.4 C)-98.5 F (36.9 C)] 97.9 F (36.6 C) (10/10 0400) Pulse Rate:  [68-91] 87 (10/10 0700) Resp:  [10-26] 26 (10/10 0700) BP: (101-121)/(47-60) 112/57 mmHg (10/10 0700) SpO2:  [94 %-100 %] 98 % (10/10 0700)   INTAKE / OUTPUT: Intake/Output     10/09 0701 - 10/10 0700 10/10 0701 - 10/11 0700   P.O. 120    I.V. (mL/kg) 520 (4)    Blood     IV Piggyback     Total Intake(mL/kg) 640 (4.9)    Urine (mL/kg/hr) 1950 (0.6)    Total Output 1950     Net -1310          Urine Occurrence 1 x     PHYSICAL EXAMINATION: General: elderly male, NAD, resting in bed Neuro:  A&Ox3, responds appropriately HEENT: NCAT,  jvd present Cardiovascular:  RRR, s1 s 2 Lungs:  Clear to auscultation bilaterally, mildly diminished at bases Abdomen:  Soft, nontender, + BS Musculoskeletal:  Moves all extremities, edema 1 plus Ext: left upper leg with surgical dressing in place,  clean/dry Skin:  Intact, warm and dry   LABS:  Recent Labs Lab 01/20/13 0048 01/20/13 0049 01/20/13 0815  01/24/13 0510 01/25/13 0425 01/26/13 0439  HGB  --  11.8*  --   < > 7.6* 8.5* 8.7*  WBC  --  6.8  --   < > 9.2 9.5 8.9  PLT  --  173  --   < > 146* 150 198  NA  --  138  --   < > 127* 133* 132*  K  --  4.3  --   < > 3.5 3.2* 3.3*  CL  --  102  --   < > 94* 99 98  CO2  --  23  --   < > 23 25 25   GLUCOSE  --  133*  --   < > 128* 121* 108*  BUN  --  15  --   < > 14 15 12   CREATININE  --  0.86  --   < > 0.69 0.62 0.60  CALCIUM  --  8.5  --   < > 7.6* 7.5* 7.8*  MG  --   --   --   --   --  2.0 2.0  PHOS  --   --   --   --   --  1.8* 2.5  AST  --  16  --   --   --   --   --  ALT  --  10  --   --   --   --   --   ALKPHOS  --  125*  --   --   --   --   --   BILITOT  --  0.3  --   --   --   --   --   PROT  --  6.6  --   --   --   --   --   ALBUMIN  --  3.1*  --   --   --   --   --   APTT  --  47*  --   --   --   --   --   INR  --  2.50* 1.68*  --   --   --   --   TROPONINI <0.30  --   --   --   --   --   --   < > = values in this interval not displayed.  Recent Labs Lab 01/23/13 0057 01/25/13 1203  GLUCAP 130* 138*   CXR 10/10: mild edema, improving   ASSESSMENT / PLAN:  PULMONARY A:   Pulm edema from prior afib rvr P:   -Goal SpO2>92, supplement as needed - post op IS -low threshold lasix -rate control important  CARDIOVASCULAR A:  CAD Atrial flutter Hx of AAA Hypotension- improved, likely related to lasix  Afib now rvr-rate controlled 10/8 P:  -responding well to digoxin, level check in am, assess renal fxn closely, he is clinically therapeutic now -Continue preadmission Zocor, ASA -metoprolol, cozaar being held as BP WNL, would restart metoprolol first when indicated -continue xarelto  RENAL A:  Hyponatremia Hypokalemia Hypophosphatemia  P:   -kcl -lasix x 1 likely -kvo  GASTROINTESTINAL/GU A:   No acute issue P:   - continue normal  diet  HEMATOLOGIC A:  Anemia of acute blood loss- pt received 1 U PRBC 10/8 for hgb 7.6  Dilution anemia a  concern Coagulopathy P:  -Xarelto -continue Iron -limit phlebeotomy  INFECTIOUS A:   No acute issues P:   -monitor cbc and fever curve  -wound care - dc line  ENDOCRINE  A:   Hx hypothyroidism P:   -Continue home Synthroid -monitor CBGs  NEUROLOGIC A:   Acute encephalopathy- resolved Post op pain P:   -Dilaudid / Oxycodone PRN- caution with hypotension  -Robaxin PRN - PT when hemodynamics improve - PT scheduled to move to SNF for rehab   Corrina Baglia, [employer]  I have personally obtained history, examined patient, evaluated and interpreted laboratory and imaging results, reviewed medical records, edited assessment / plan and placed orders.  I have fully examined this patient and agree with above findings.    And edited  In full  To traid, to tele  01/26/2013, 7:53 AM  Mcarthur Rossetti. Tyson Alias, MD, FACP Pgr: [phone] [facility] [facility]

## 2013-01-27 LAB — BASIC METABOLIC PANEL
BUN: 12 mg/dL (ref 6–23)
CO2: 24 mEq/L (ref 19–32)
Calcium: 7.9 mg/dL — ABNORMAL LOW (ref 8.4–10.5)
GFR calc Af Amer: >90 mL/min (ref >90)
GFR calc non Af Amer: 88 mL/min — ABNORMAL LOW (ref >90)
Glucose, Bld: 112 mg/dL — ABNORMAL HIGH (ref 70–99)
Sodium: 134 mEq/L — ABNORMAL LOW (ref 135–145)

## 2013-01-27 MED ORDER — DIGOXIN 125 MCG PO TABS: 0.1250 mg | ORAL_TABLET | Freq: Every day | ORAL | Status: DC

## 2013-01-27 MED ORDER — DIGOXIN 125 MCG PO TABS: 0.1250 mg | ORAL_TABLET | Freq: Every day | ORAL | Status: AC

## 2013-01-27 MED ORDER — ZOLPIDEM TARTRATE 10 MG PO TABS: 10.0000 mg | ORAL_TABLET | Freq: Every evening | ORAL | Status: DC | PRN

## 2013-01-27 MED ORDER — METHOCARBAMOL 500 MG PO TABS: 500.0000 mg | ORAL_TABLET | Freq: Four times a day (QID) | ORAL | Status: DC | PRN

## 2013-01-27 MED ORDER — PANTOPRAZOLE SODIUM 40 MG PO TBEC: 40.0000 mg | DELAYED_RELEASE_TABLET | Freq: Every day | ORAL | Status: DC

## 2013-01-27 MED ORDER — OXYCODONE HCL 5 MG PO TABS: 5.0000 mg | ORAL_TABLET | ORAL | Status: DC | PRN

## 2013-01-27 MED ORDER — ENSURE COMPLETE PO LIQD: 237.0000 mL | Freq: Two times a day (BID) | ORAL | Status: DC

## 2013-01-27 NOTE — Progress Notes (Signed)
Attempted to call [facility] several times to give report the number this nurse was given  [phone] by CSW goes to a voicemail answered by some lab. Heavily accented voice unable to understand what lab answered.  PTAR arrived daughter and son in room, were told to be here at 1300 by CSW, rather agitated that [facility] didn't arrive until this time.transferred to stretcher by two attendants and this nurse. Tolerated well.

## 2013-01-27 NOTE — Discharge Summary (Signed)
Physician Discharge Summary  Patient ID: Austin [name] MRN: [id number] DOB/AGE: 1927/01/18 79 y.o.  Admit date: 01/20/2013 Discharge date: 01/27/2013  Primary Care Physician:  Ailene Ravel, MD  Discharge Diagnoses:   Post op hypotension with circulatory/hypovolemic shock  . atrial fibrillation with RVR with history of Chronic atrial flutter on xarelto  . Hyperlipidemia . Essential hypertension . Coronary artery disease . Obese . Traumatic hematoma of left lower leg Acute metabolic encephalopathy resolved Acute blood loss anemia Pulmonary edema from atrial fibrillation with RVR improved Left Distal femur fracture status post open reduction internal fixation  Consults:  Critical care, Dr. Tyson Alias                     Orthopedics, Dr. Charlann Boxer  Recommendations for Outpatient Follow-up:  1. please hold Toprol-XL if patient's BP is on the lower side (SBP less than 100) 2. he was on losartan pre-admission, has been held to do to hypertension 3. he is on xarelto  4. per orthopedics NWB LLE but ROM, passive exercises and active therapy are encouraged with his left leg  Encouraged to work on upper extremity strengthening and right lower extremity strengthening as much as he can   Allergies:  No Known Allergies   Discharge Medications:   Medication List    STOP taking these medications       aspirin 81 MG tablet     losartan 50 MG tablet  Commonly known as:  COZAAR      TAKE these medications       cholecalciferol 1000 UNITS tablet  Commonly known as:  VITAMIN D  Take 1,000 Units by mouth daily.     digoxin 0.125 MG tablet  Commonly known as:  LANOXIN  Take 1 tablet (0.125 mg total) by mouth daily.  Start taking on:  01/28/2013     feeding supplement (ENSURE COMPLETE) Liqd  Take 237 mLs by mouth 2 (two) times daily between meals.     ferrous sulfate 325 (65 FE) MG tablet  Take 1 tablet (325 mg total) by mouth 3 (three) times daily after meals.     levothyroxine 75 MCG tablet  Commonly known as:  SYNTHROID, LEVOTHROID  Take 75 mcg by mouth daily before breakfast.     lovastatin 40 MG tablet  Commonly known as:  MEVACOR  Take 40 mg by mouth every morning.     methocarbamol 500 MG tablet  Commonly known as:  ROBAXIN  Take 1 tablet (500 mg total) by mouth every 6 (six) hours as needed (muscle spasms).     metoprolol succinate 25 MG 24 hr tablet  Commonly known as:  TOPROL-XL  Take 25 mg by mouth every morning.     multivitamin with minerals Tabs tablet  Take 1 tablet by mouth daily.     oxyCODONE 5 MG immediate release tablet  Commonly known as:  Oxy IR/ROXICODONE  Take 1 tablet (5 mg total) by mouth every 4 (four) hours as needed for pain.     pantoprazole 40 MG tablet  Commonly known as:  PROTONIX  Take 1 tablet (40 mg total) by mouth daily.     trolamine salicylate 10 % cream  Commonly known as:  ASPERCREME  Apply 1 application topically as needed (for muscle pain).     XARELTO 20 MG Tabs tablet  Generic drug:  Rivaroxaban  Take 20 mg by mouth daily.     zolpidem 10 MG tablet  Commonly known as:  AMBIEN  Take  1 tablet (10 mg total) by mouth at bedtime as needed for sleep.         Brief H and P: For complete details please refer to admission H and P, but in brief Austin [name] is a 79 y.o. male who just had a L hip replacement 6 weeks ago (also has h/o L knee replacement 9 years ago), who unfortunately suffered a fall when his leg gave out on him (no LOC). Unfortunately he landed on his L leg. Leg was grossly deformed and movement very painful, (pain worse with movement, better with rest).  In the ED, his distal femur was confirmed to be broken, the hip and knee replacements appear to be intact. He is also developing a hematoma in that leg (likely secondary to Xarelto he takes for his A.Fib), ortho consulted.    Hospital Course:  Patient is an 79 year old male with past medical history of hypertension, AAA,  chronic atrial flutter on xarelto, left hip replacement recently presented with a wall and left distal femur fracture. Patient underwent open reduction and internal fixation of the left distal femur on 01/22/2013. Postoperatively however patient developed atrial fibrillation with RVR, placed on Cardizem drip and developed hypotension on 01/23/13. Patient was found to be confused and encephalopathic. He was transferred to intensive care unit under the critical care service. Patient was transferred out to telemetry floor on 01/26/2013.   Coronary artery disease with atrial flutter, atrial fibrillation with RVR: Patient was admitted to ICU on 01/23/2013 after he developed date of admission with RVR and hypotension. Initially he was placed on Cardizem drip but had to be discontinued due to hypotension. Cozaar, metoprolol were held. Subsequently patient was started on digoxin with good response for the rapid ventricular rate. Digoxin is transitioned to oral route to be started tomorrow. Patient can restart Toprol XL as tolerated with his BP, hold for SBP less than 100. Losartan has been held due to borderline hypotension. Continue xarelto.  Acute metabolic encephalopathy: Resolved , likely due to narcotics and acute illness. Please use oxycodone with caution.  Distal femur fracture, left: Patient underwent ORIF on 01/22/2013, patient has been followed closely by orthopedic service. Per orthopedics, NWB LLE but ROM, passive exercises and active therapy are encouraged with left leg. He will follow with Dr. Charlann Boxer in 2 weeks.   Patient has left lower extremity edema, bilateral lower extremity Dopplers were obtained which was negative for any DVT or superficial thrombosis.    Day of Discharge BP 131/73  Pulse 91  Temp(Src) 98.4 F (36.9 C) (Oral)  Resp 20  Ht 6\' 5"  (1.956 m)  Wt 130.6 kg (287 lb 14.7 oz)  BMI 34.14 kg/m2  SpO2 95%  Physical Exam: General: Alert and awake oriented not in any acute  distress. CVS: S1-S2 clear no murmur rubs or gallops Chest: clear to auscultation bilaterally, no wheezing rales or rhonchi Abdomen: soft nontender, nondistended, normal bowel sounds Extremities: no cyanosis, clubbing 1+ edema in left lower extremity    The results of significant diagnostics from this hospitalization (including imaging, microbiology, ancillary and laboratory) are listed below for reference.    LAB RESULTS: Basic Metabolic Panel:  Recent Labs Lab 01/26/13 0439 01/27/13 0430  NA 132* 134*  K 3.3* 3.6  CL 98 100  CO2 25 24  GLUCOSE 108* 112*  BUN 12 12  CREATININE 0.60 0.63  CALCIUM 7.8* 7.9*  MG 2.0  --   PHOS 2.5  --    Liver Function Tests: No  results found for this basename: AST, ALT, ALKPHOS, BILITOT, PROT, ALBUMIN,  in the last 168 hours No results found for this basename: LIPASE, AMYLASE,  in the last 168 hours No results found for this basename: AMMONIA,  in the last 168 hours CBC:  Recent Labs Lab 01/23/13 1200  01/25/13 0425 01/26/13 0439  WBC 9.8  < > 9.5 8.9  NEUTROABS 7.5  --   --   --   HGB 8.6*  < > 8.5* 8.7*  HCT 24.4*  < > 24.3* 25.4*  MCV 90.0  < > 88.4 90.1  PLT 141*  < > 150 198  < > = values in this interval not displayed. Cardiac Enzymes: No results found for this basename: CKTOTAL, CKMB, CKMBINDEX, TROPONINI,  in the last 168 hours BNP: No components found with this basename: POCBNP,  CBG:  Recent Labs Lab 01/23/13 0057 01/25/13 1203  GLUCAP 130* 138*    Significant Diagnostic Studies:  Dg Hip Complete Left  01/20/2013   *RADIOLOGY REPORT*  Clinical Data: Left hip pain, fall.  LEFT HIP - COMPLETE 2+ VIEW  Comparison: None available at time of study interpretation.  Findings: Frontal view of the pelvis and three images of the left hip. Status post left hip total arthroplasty, with intact well seated components. Included view of the distal femur, distal to the femoral stem demonstrates a partially imaged comminuted  fracture.  Severe right hip osteoarthrosis.  Phleboliths in the pelvis. Severe vascular calcifications.  Surgical clips in the right inguinal region.  Moderate degenerative change of the lumbar spine.  IMPRESSION: Partially imaged distal left femur fracture, consider dedicated femur radiograph if clinically indicated.  Status post left hip total arthroplasty without radiographic findings of hardware failure.  Severe degenerative change of the right hip.   Original Report Authenticated By: Awilda Metro   Dg Knee 1-2 Views Left  01/20/2013   **ADDENDUM** CREATED: 01/20/2013 02:03:46  Findings:  The frontal radiograph of the knee is obliqued due to patient condition, in frog-leg position.  **END ADDENDUM** SIGNED BY: Courtnay  Bloomer  01/20/2013   *RADIOLOGY REPORT*  Clinical Data: Pain, fall  LEFT KNEE - 1-2 VIEW  Comparison: None available at time of study interpretation.  Findings: Frontal radiograph.  Comminuted mildly impacted distal femur fracture, immediately proximal to the femoral component of patient's total knee arthroplasty.  No periprosthetic lucency.  Fracture fragments are in gross alignment.  No dislocation.  No destructive bony lesions.  Moderate vascular calcifications. Small suprapatellar joint effusion.  IMPRESSION: Comminuted, grossly aligned distal femur fracture extending to the femoral component of patient's total knee arthroplasty.  No dislocation.   Original Report Authenticated By: Awilda Metro      Disposition and Follow-up:     Discharge Orders   Future Appointments Provider Department Dept Phone   10/28/2014 8:30 AM [facility] [address] [phone]   Eat a light meal the night before the exam but please avoid gaseous foods.   Nothing to eat or drink for at least 8 hours prior to the exam. No gum chewing or smoking the morning of the exam. Please take your morning medications with small sips of water, especially blood pressure  medication. If you have several vascular lab exams and will see physician, please bring a snack with you.   10/28/2014 9:00 AM Nada Libman, MD [facility] [phone]   Future Orders Complete By Expires   Diet - low sodium heart healthy  As directed    Increase activity slowly  As directed        DISPOSITION: Skilled nursing facility  DIET:Heart healthy diet  ACTIVITY:As tolerated  DISCHARGE FOLLOW-UP Follow-up Information   Follow up with Shelda Pal, MD. Schedule an appointment as soon as possible for a visit in 2 weeks.   Specialty:  Orthopedic Surgery   Contact information:   [address] [place] [address] [phone]       Follow up with [facility], MD. Schedule an appointment as soon as possible for a visit in 10 days. (For hospital followup)    Specialty:  Family Medicine   Contact information:   Dr. Burnell Blanks [address] [place] [address] [phone]       Time spent on Discharge: 40 mins  Signed:   Jared Whorley M.D. [facility] 01/27/2013, 10:54 AM Pager: [phone]

## 2013-01-27 NOTE — Progress Notes (Signed)
ANTICOAGULATION CONSULT NOTE - Follow-up Consult  Pharmacy Consult for Xarelto Indication: atrial fibrillation  No Known Allergies  Patient Measurements: Height: 6\' 5"  (195.6 cm) Weight: 287 lb 14.7 oz (130.6 kg) IBW/kg (Calculated) : 89.1 Vital Signs: Temp: 98.4 F (36.9 C) (10/11 0438) Temp src: Oral (10/11 0438) BP: 131/73 mmHg (10/11 0438) Pulse Rate: 91 (10/11 0438)  Labs:  Recent Labs  01/25/13 0425 01/26/13 0439 01/27/13 0430  HGB 8.5* 8.7*  --   HCT 24.3* 25.4*  --   PLT 150 198  --   CREATININE 0.62 0.60 0.63    Estimated Creatinine Clearance: 102.8 ml/min (by C-G formula based on Cr of 0.63).  Assessment: 79 yo male s/p ORIF left femur fracture. Pt on Xarelto PTA for h/o Afib - resumed post-op. Hgb low but stable. No bleeding noted. CrCl remains stable. Pt educated 10/10.     Plan:  Continue Xarelto 20 mg po daily with supper F/u CBC, signs and symptoms of bleeding  Christoper Fabian, PharmD, BCPS Clinical pharmacist, pager [phone] 01/27/2013,8:05 AM

## 2013-01-27 NOTE — Progress Notes (Signed)
Patient ID: Austin [name], male   DOB: Jun 14, 1926, 79 y.o.   MRN: [id number] Subjective: 5 Days Post-Op Procedure(s) (LRB): OPEN REDUCTION INTERNAL FIXATION (ORIF) DISTAL FEMUR FRACTURE (Left)    Patient reports pain as mild. Not much activity at this point, based on restrictions - reviewed my goals and intentions for him  Objective:   VITALS:   Filed Vitals:   01/27/13 0438  BP: 131/73  Pulse: 91  Temp: 98.4 F (36.9 C)  Resp: 20    Neurovascular intact Incision: dressing C/D/I  LABS  Recent Labs  01/25/13 0425 01/26/13 0439  HGB 8.5* 8.7*  HCT 24.3* 25.4*  WBC 9.5 8.9  PLT 150 198     Recent Labs  01/25/13 0425 01/26/13 0439 01/27/13 0430  NA 133* 132* 134*  K 3.2* 3.3* 3.6  BUN 15 12 12   CREATININE 0.62 0.60 0.63  GLUCOSE 121* 108* 112*    No results found for this basename: LABPT, INR,  in the last 72 hours   Assessment/Plan: 5 Days Post-Op Procedure(s) (LRB): OPEN REDUCTION INTERNAL FIXATION (ORIF) DISTAL FEMUR FRACTURE (Left)   Up with therapy Discharge to SNF Monday apparently  NWB LLE but ROM, passive exercises and active therapy are encouraged with his left leg Encouraged to work on upper extremity strengthening and right lower extremity strengthening as much as he can  RTC 2 weeks  Dressing to left thigh as needed

## 2013-01-27 NOTE — Clinical Social Work Placement (Signed)
     Clinical Social Work Department CLINICAL SOCIAL WORK PLACEMENT NOTE 01/27/2013  Patient:  Austin [name], Austin [name]  Account Number:  [id number] Admit date:  01/20/2013  Clinical Social Worker:  Macario Golds, LCSW  Date/time:  01/23/2013 04:00 PM  Clinical Social Work is seeking post-discharge placement for this patient at the following level of care:   SKILLED NURSING   (*CSW will update this form in Epic as items are completed)   01/23/2013  Patient/family provided with [facility] Department of Clinical Social Works list of facilities offering this level of care within the geographic area requested by the patient (or if unable, by the patients family).  01/23/2013  Patient/family informed of their freedom to choose among providers that offer the needed level of care, that participate in Medicare, Medicaid or managed care program needed by the patient, have an available bed and are willing to accept the patient.  01/23/2013  Patient/family informed of [facility] ownership interest in [facility], as well as of the fact that they are under no obligation to receive care at this facility.  PASARR submitted to EDS on 01/23/2013 PASARR number received from EDS on 01/23/2013  FL2 transmitted to all facilities in geographic area requested by pt/family on  01/23/2013 FL2 transmitted to all facilities within larger geographic area on   Patient informed that his/her managed care company has contracts with or will negotiate with  certain facilities, including the following:     Patient/family informed of bed offers received:  01/27/2013 Patient chooses bed at [facility], [place] Physician recommends and patient chooses bed at    [facility] be [facility] [place] on  01/27/2013 Patient to be transferred to facility by Ambulance  Sharin Mons)  The following physician request were entered in Epic:   Additional Comments: 10/7 -  Patient only agreeable to [facility] [place]  01/27/13. Ok per MD for d/c today to [facility].  OK per SNF, patient, daughter and nursing.They are pleased with d/c plan. CSW signing off.  Lorri Frederick. Jazzlynn Rawe, LCSWA  [phone]

## 2013-05-03 ENCOUNTER — Encounter (HOSPITAL_COMMUNITY): Payer: Self-pay | Admitting: Emergency Medicine

## 2013-05-03 ENCOUNTER — Emergency Department (HOSPITAL_COMMUNITY): Payer: Medicare Other

## 2013-05-03 ENCOUNTER — Inpatient Hospital Stay (HOSPITAL_COMMUNITY)
Admission: EM | Admit: 2013-05-03 | Discharge: 2013-05-07 | DRG: 871 | Disposition: A | Discharge status: Home / Self Care | Payer: Medicare Other | Attending: Internal Medicine | Admitting: Internal Medicine

## 2013-05-03 DIAGNOSIS — I6350 Cerebral infarction due to unspecified occlusion or stenosis of unspecified cerebral artery: Secondary | ICD-10-CM

## 2013-05-03 DIAGNOSIS — F29 Unspecified psychosis not due to a substance or known physiological condition: Secondary | ICD-10-CM | POA: Diagnosis present

## 2013-05-03 DIAGNOSIS — E785 Hyperlipidemia, unspecified: Secondary | ICD-10-CM | POA: Diagnosis present

## 2013-05-03 DIAGNOSIS — I639 Cerebral infarction, unspecified: Secondary | ICD-10-CM | POA: Diagnosis present

## 2013-05-03 DIAGNOSIS — Z87891 Personal history of nicotine dependence: Secondary | ICD-10-CM

## 2013-05-03 DIAGNOSIS — I4892 Unspecified atrial flutter: Secondary | ICD-10-CM

## 2013-05-03 DIAGNOSIS — Z9981 Dependence on supplemental oxygen: Secondary | ICD-10-CM

## 2013-05-03 DIAGNOSIS — J189 Pneumonia, unspecified organism: Secondary | ICD-10-CM | POA: Diagnosis present

## 2013-05-03 DIAGNOSIS — I2510 Atherosclerotic heart disease of native coronary artery without angina pectoris: Secondary | ICD-10-CM | POA: Diagnosis present

## 2013-05-03 DIAGNOSIS — A0472 Enterocolitis due to Clostridium difficile, not specified as recurrent: Secondary | ICD-10-CM | POA: Diagnosis present

## 2013-05-03 DIAGNOSIS — E871 Hypo-osmolality and hyponatremia: Secondary | ICD-10-CM | POA: Diagnosis present

## 2013-05-03 DIAGNOSIS — I714 Abdominal aortic aneurysm, without rupture, unspecified: Secondary | ICD-10-CM | POA: Diagnosis present

## 2013-05-03 DIAGNOSIS — D320 Benign neoplasm of cerebral meninges: Secondary | ICD-10-CM | POA: Diagnosis present

## 2013-05-03 DIAGNOSIS — G9340 Encephalopathy, unspecified: Secondary | ICD-10-CM

## 2013-05-03 DIAGNOSIS — I10 Essential (primary) hypertension: Secondary | ICD-10-CM | POA: Diagnosis present

## 2013-05-03 DIAGNOSIS — E43 Unspecified severe protein-calorie malnutrition: Secondary | ICD-10-CM | POA: Diagnosis present

## 2013-05-03 DIAGNOSIS — J4489 Other specified chronic obstructive pulmonary disease: Secondary | ICD-10-CM | POA: Diagnosis present

## 2013-05-03 DIAGNOSIS — J449 Chronic obstructive pulmonary disease, unspecified: Secondary | ICD-10-CM | POA: Diagnosis present

## 2013-05-03 DIAGNOSIS — F1020 Alcohol dependence, uncomplicated: Secondary | ICD-10-CM | POA: Diagnosis present

## 2013-05-03 DIAGNOSIS — Z7901 Long term (current) use of anticoagulants: Secondary | ICD-10-CM

## 2013-05-03 DIAGNOSIS — E039 Hypothyroidism, unspecified: Secondary | ICD-10-CM | POA: Diagnosis present

## 2013-05-03 DIAGNOSIS — Z8612 Personal history of poliomyelitis: Secondary | ICD-10-CM

## 2013-05-03 DIAGNOSIS — M129 Arthropathy, unspecified: Secondary | ICD-10-CM | POA: Diagnosis present

## 2013-05-03 DIAGNOSIS — A419 Sepsis, unspecified organism: Secondary | ICD-10-CM

## 2013-05-03 DIAGNOSIS — R197 Diarrhea, unspecified: Secondary | ICD-10-CM | POA: Diagnosis present

## 2013-05-03 DIAGNOSIS — R4182 Altered mental status, unspecified: Secondary | ICD-10-CM

## 2013-05-03 LAB — COMPREHENSIVE METABOLIC PANEL
ALBUMIN: 2.3 g/dL — AB (ref 3.5–5.2)
ALT: 17 U/L (ref 0–53)
AST: 20 U/L (ref 0–37)
Alkaline Phosphatase: 94 U/L (ref 39–117)
BILIRUBIN TOTAL: 0.7 mg/dL (ref 0.3–1.2)
BUN: 9 mg/dL (ref 6–23)
CHLORIDE: 97 meq/L (ref 96–112)
CO2: 24 mEq/L (ref 19–32)
CREATININE: 0.67 mg/dL (ref 0.50–1.35)
Calcium: 8.7 mg/dL (ref 8.4–10.5)
GFR calc Af Amer: >90 mL/min (ref >90)
GFR calc non Af Amer: 86 mL/min — ABNORMAL LOW (ref >90)
Glucose, Bld: 108 mg/dL — ABNORMAL HIGH (ref 70–99)
POTASSIUM: 3.4 meq/L — AB (ref 3.7–5.3)
Sodium: 134 mEq/L — ABNORMAL LOW (ref 137–147)
Total Protein: 6.5 g/dL (ref 6.0–8.3)

## 2013-05-03 LAB — CBC WITH DIFFERENTIAL/PLATELET
Basophils Absolute: 0 10*3/uL (ref 0.0–0.1)
Basophils Relative: 0 % (ref 0–1)
Eosinophils Absolute: 0 10*3/uL (ref 0.0–0.7)
Eosinophils Relative: 0 % (ref 0–5)
HEMATOCRIT: 36.7 % — AB (ref 39.0–52.0)
HEMOGLOBIN: 12.3 g/dL — AB (ref 13.0–17.0)
LYMPHS ABS: 1.3 10*3/uL (ref 0.7–4.0)
LYMPHS PCT: 7 % — AB (ref 12–46)
MCH: 29.4 pg (ref 26.0–34.0)
MCHC: 33.5 g/dL (ref 30.0–36.0)
MCV: 87.8 fL (ref 78.0–100.0)
MONO ABS: 1.6 10*3/uL — AB (ref 0.1–1.0)
MONOS PCT: 9 % (ref 3–12)
NEUTROS ABS: 15.7 10*3/uL — AB (ref 1.7–7.7)
NEUTROS PCT: 84 % — AB (ref 43–77)
Platelets: 211 10*3/uL (ref 150–400)
RBC: 4.18 MIL/uL — AB (ref 4.22–5.81)
RDW: 16.5 % — ABNORMAL HIGH (ref 11.5–15.5)
WBC: 18.7 10*3/uL — AB (ref 4.0–10.5)

## 2013-05-03 LAB — URINALYSIS, ROUTINE W REFLEX MICROSCOPIC
Bilirubin Urine: NEGATIVE
GLUCOSE, UA: NEGATIVE mg/dL
Hgb urine dipstick: NEGATIVE
Ketones, ur: NEGATIVE mg/dL
LEUKOCYTES UA: NEGATIVE
NITRITE: NEGATIVE
Protein, ur: NEGATIVE mg/dL
SPECIFIC GRAVITY, URINE: 1.023 (ref 1.005–1.030)
Urobilinogen, UA: 0.2 mg/dL (ref 0.0–1.0)
pH: 5 (ref 5.0–8.0)

## 2013-05-03 LAB — POCT I-STAT 3, VENOUS BLOOD GAS (G3P V)
Acid-Base Excess: 1 mmol/L (ref 0.0–2.0)
BICARBONATE: 24.9 meq/L — AB (ref 20.0–24.0)
O2 Saturation: 87 %
PCO2 VEN: 36.5 mmHg — AB (ref 45.0–50.0)
TCO2: 26 mmol/L (ref 0–100)
pH, Ven: 7.441 — ABNORMAL HIGH (ref 7.250–7.300)
pO2, Ven: 50 mmHg — ABNORMAL HIGH (ref 30.0–45.0)

## 2013-05-03 LAB — PRO B NATRIURETIC PEPTIDE: Pro B Natriuretic peptide (BNP): 622.4 pg/mL — ABNORMAL HIGH (ref 0–450)

## 2013-05-03 LAB — DIGOXIN LEVEL: Digoxin Level: 0.3 ng/mL — ABNORMAL LOW (ref 0.8–2.0)

## 2013-05-03 LAB — PROTIME-INR
INR: 1.69 — ABNORMAL HIGH (ref 0.00–1.49)
Prothrombin Time: 19.4 seconds — ABNORMAL HIGH (ref 11.6–15.2)

## 2013-05-03 LAB — LIPASE, BLOOD: LIPASE: 12 U/L (ref 11–59)

## 2013-05-03 LAB — INFLUENZA PANEL BY PCR (TYPE A & B)
H1N1 flu by pcr: NOT DETECTED
INFLAPCR: NEGATIVE
Influenza B By PCR: NEGATIVE

## 2013-05-03 LAB — TROPONIN I: Troponin I: <0.3 ng/mL (ref <0.30)

## 2013-05-03 LAB — CG4 I-STAT (LACTIC ACID): Lactic Acid, Venous: 1.42 mmol/L (ref 0.5–2.2)

## 2013-05-03 MED ORDER — POTASSIUM CHLORIDE 10 MEQ/100ML IV SOLN
10.0000 meq | INTRAVENOUS | Status: AC
  Administered 2013-05-03 – 2013-05-04 (×4): 10 meq via INTRAVENOUS
  Filled 2013-05-03 (×3): qty 100

## 2013-05-03 MED ORDER — SODIUM CHLORIDE 0.9 % IV BOLUS (SEPSIS)
250.0000 mL | Freq: Once | INTRAVENOUS | Status: AC
  Administered 2013-05-03: 1000 mL via INTRAVENOUS

## 2013-05-03 MED ORDER — OSELTAMIVIR PHOSPHATE 75 MG PO CAPS
75.0000 mg | ORAL_CAPSULE | Freq: Two times a day (BID) | ORAL | Status: DC
  Filled 2013-05-03 (×3): qty 1

## 2013-05-03 MED ORDER — VANCOMYCIN HCL 10 G IV SOLR
2500.0000 mg | Freq: Once | INTRAVENOUS | Status: AC
  Administered 2013-05-03: 2500 mg via INTRAVENOUS
  Filled 2013-05-03: qty 2500

## 2013-05-03 MED ORDER — OXYCODONE HCL 5 MG PO TABS: 5.0000 mg | ORAL_TABLET | ORAL | Status: DC | PRN

## 2013-05-03 MED ORDER — ONDANSETRON HCL 4 MG PO TABS: 4.0000 mg | ORAL_TABLET | Freq: Four times a day (QID) | ORAL | Status: DC | PRN

## 2013-05-03 MED ORDER — ASPIRIN 300 MG RE SUPP
300.0000 mg | Freq: Every day | RECTAL | Status: DC
  Administered 2013-05-03 – 2013-05-04 (×2): 300 mg via RECTAL
  Filled 2013-05-03 (×3): qty 1

## 2013-05-03 MED ORDER — METHOCARBAMOL 500 MG PO TABS
500.0000 mg | ORAL_TABLET | Freq: Four times a day (QID) | ORAL | Status: DC | PRN
  Filled 2013-05-03: qty 1

## 2013-05-03 MED ORDER — DIGOXIN 125 MCG PO TABS
0.1250 mg | ORAL_TABLET | Freq: Every day | ORAL | Status: DC
  Administered 2013-05-04 – 2013-05-07 (×4): 0.125 mg via ORAL
  Filled 2013-05-03 (×4): qty 1

## 2013-05-03 MED ORDER — LEVOTHYROXINE SODIUM 75 MCG PO TABS
75.0000 ug | ORAL_TABLET | Freq: Every day | ORAL | Status: DC
  Administered 2013-05-04 – 2013-05-07 (×4): 75 ug via ORAL
  Filled 2013-05-03 (×5): qty 1

## 2013-05-03 MED ORDER — SODIUM CHLORIDE 0.9 % IJ SOLN
3.0000 mL | Freq: Two times a day (BID) | INTRAMUSCULAR | Status: DC
  Administered 2013-05-04 – 2013-05-06 (×5): 3 mL via INTRAVENOUS

## 2013-05-03 MED ORDER — THIAMINE HCL 100 MG/ML IJ SOLN
100.0000 mg | Freq: Every day | INTRAMUSCULAR | Status: DC
  Administered 2013-05-03 – 2013-05-04 (×2): 100 mg via INTRAVENOUS
  Filled 2013-05-03 (×3): qty 1

## 2013-05-03 MED ORDER — ONDANSETRON HCL 4 MG/2ML IJ SOLN
4.0000 mg | Freq: Once | INTRAMUSCULAR | Status: AC
  Administered 2013-05-03: 4 mg via INTRAVENOUS
  Filled 2013-05-03: qty 2

## 2013-05-03 MED ORDER — LEVALBUTEROL HCL 0.63 MG/3ML IN NEBU: 0.6300 mg | INHALATION_SOLUTION | Freq: Four times a day (QID) | RESPIRATORY_TRACT | Status: DC | PRN

## 2013-05-03 MED ORDER — DEXTROSE 5 % IV SOLN
1.0000 g | Freq: Two times a day (BID) | INTRAVENOUS | Status: DC
  Administered 2013-05-04 – 2013-05-06 (×5): 1 g via INTRAVENOUS
  Filled 2013-05-03 (×7): qty 1

## 2013-05-03 MED ORDER — RIVAROXABAN 20 MG PO TABS
20.0000 mg | ORAL_TABLET | Freq: Every day | ORAL | Status: DC
  Administered 2013-05-04 – 2013-05-06 (×3): 20 mg via ORAL
  Filled 2013-05-03 (×5): qty 1

## 2013-05-03 MED ORDER — SODIUM CHLORIDE 0.9 % IV SOLN: INTRAVENOUS | Status: DC

## 2013-05-03 MED ORDER — ACETAMINOPHEN 325 MG PO TABS
650.0000 mg | ORAL_TABLET | Freq: Four times a day (QID) | ORAL | Status: DC | PRN
Start: 2013-05-03 — End: 2013-05-07

## 2013-05-03 MED ORDER — SODIUM CHLORIDE 0.9 % IV SOLN
INTRAVENOUS | Status: DC
  Administered 2013-05-03 (×3): via INTRAVENOUS

## 2013-05-03 MED ORDER — ONDANSETRON HCL 4 MG/2ML IJ SOLN: 4.0000 mg | Freq: Four times a day (QID) | INTRAMUSCULAR | Status: DC | PRN

## 2013-05-03 MED ORDER — BUPROPION HCL ER (SR) 100 MG PO TB12
100.0000 mg | ORAL_TABLET | Freq: Every day | ORAL | Status: DC
  Administered 2013-05-04 – 2013-05-07 (×4): 100 mg via ORAL
  Filled 2013-05-03 (×4): qty 1

## 2013-05-03 MED ORDER — VANCOMYCIN HCL 10 G IV SOLR
1250.0000 mg | Freq: Two times a day (BID) | INTRAVENOUS | Status: DC
  Administered 2013-05-04 – 2013-05-06 (×5): 1250 mg via INTRAVENOUS
  Filled 2013-05-03 (×7): qty 1250

## 2013-05-03 MED ORDER — SIMVASTATIN 20 MG PO TABS
20.0000 mg | ORAL_TABLET | Freq: Every day | ORAL | Status: DC
  Administered 2013-05-04 – 2013-05-06 (×3): 20 mg via ORAL
  Filled 2013-05-03 (×4): qty 1

## 2013-05-03 MED ORDER — ACETAMINOPHEN 650 MG RE SUPP: 650.0000 mg | Freq: Four times a day (QID) | RECTAL | Status: DC | PRN

## 2013-05-03 MED ORDER — SODIUM CHLORIDE 0.9 % IV SOLN
INTRAVENOUS | Status: DC
  Administered 2013-05-04: 01:00:00 via INTRAVENOUS
  Administered 2013-05-04: 50 mL/h via INTRAVENOUS
  Administered 2013-05-04 – 2013-05-05 (×2): via INTRAVENOUS

## 2013-05-03 MED ORDER — FLUOXETINE HCL 20 MG PO CAPS
20.0000 mg | ORAL_CAPSULE | Freq: Every day | ORAL | Status: DC
Start: 2013-05-04 — End: 2013-05-05
  Administered 2013-05-04: 20 mg via ORAL
  Filled 2013-05-03 (×2): qty 1

## 2013-05-03 MED ORDER — DEXTROSE 5 % IV SOLN
1.0000 g | Freq: Once | INTRAVENOUS | Status: AC
  Administered 2013-05-03: 1 g via INTRAVENOUS
  Filled 2013-05-03: qty 1

## 2013-05-03 MED ORDER — PANTOPRAZOLE SODIUM 40 MG PO TBEC: 40.0000 mg | DELAYED_RELEASE_TABLET | Freq: Every day | ORAL | Status: DC

## 2013-05-03 NOTE — Progress Notes (Addendum)
ANTIBIOTIC CONSULT NOTE - INITIAL  Pharmacy Consult for vancomycin and cefepime Indication: rule out pneumonia  No Known Allergies  Patient Measurements: Height: 6' 5.17" (196 cm) Weight: 286 lb 9.6 oz (130 kg) IBW/kg (Calculated) : 89.48   Vital Signs: Temp: 100.6 F (38.1 C) (01/15 1240) Temp src: Rectal (01/15 1240) BP: 111/86 mmHg (01/15 1230) Pulse Rate: 120 (01/15 1230) Intake/Output from previous day:   Intake/Output from this shift:    Labs:  Recent Labs  05/03/13 1319  WBC 18.7*  HGB 12.3*  PLT 211  CREATININE 0.67   Estimated Creatinine Clearance: 102.8 ml/min (by C-G formula based on Cr of 0.67). No results found for this basename: VANCOTROUGH, VANCOPEAK, VANCORANDOM, GENTTROUGH, GENTPEAK, GENTRANDOM, TOBRATROUGH, TOBRAPEAK, TOBRARND, AMIKACINPEAK, AMIKACINTROU, AMIKACIN,  in the last 72 hours   Microbiology: No results found for this or any previous visit (from the past 720 hour(s)).  Medical History: Past Medical History  Diagnosis Date  . Hypertension   . Hyperlipidemia   . AAA (abdominal aortic aneurysm)   . Arthritis   . CAD (coronary artery disease)   . Thyroid disease     hypothyroidism  . Dysrhythmia     atrial flutter  . Shortness of breath     a little with exertion  . Polio 1934  . Atrial flutter     chronic anticoagulation  . COPD (chronic obstructive pulmonary disease)   . History of nuclear stress test 04/2010    dipyridamole; small, mostly fixed basal to mid inferior and inferoseptal defect (gut artifact v. scar); post stress EF 62%; non-diagnostic for ischemia; low risk     Medications:  Patient recently completed 10d course of vancomycin and zosyn  Assessment: 80 year old male from nursing home presents to [facility] with fever, weakness, and diarrhea. He has recently completed a course of broad antibiotics. CXR concerning for possible pneumonia, current temp is 100.6 with wbc count of 18.7, renal function is normal.  Of note  patient also takes xarelto for afib. CBC appears normal, no bleeding issues have been noted.   Goal of Therapy:  Vancomycin trough level 15-20 mcg/ml  Plan:  Expected duration 8 days with resolution of temperature and/or normalization of WBC Measure antibiotic drug levels at steady state Follow up culture results Vancomycin 2.5g IV x1 now then 1250mg  q12 hours Cefepime 1g q12 hours Continue xarelto 20mg  daily  Erin Hearing PharmD., BCPS Clinical Pharmacist Pager [phone] 05/03/2013 4:07 PM

## 2013-05-03 NOTE — H&P (Signed)
[facility] History and Physical  Austin [name] JXB:[id number] DOB: 05-25-1926 DOA: 05/03/2013   PCP: [name], MD  Specialists: He is followed by Dr. Quay Burow. Dr. Alvan Dame is his ortho.  Chief Complaint: Fever, altered mental status, and cough  HPI: Austin [name] is a 80 y.o. male with a past medical history of atrial flutter, hypertension, hospitalization in October for femur fracture. Patient was discharged from the hospital at that time to a skilled nursing facility for rehabilitation. Towards the end of stay in the skilled nursing facility patient developed a pneumonia. It looks like he was treated with vancomycin and Zosyn. The course of antibiotics finished on January 13. He came home to live with his daughter this past Friday. His cough had improved. However, in the last few days the family noted that the patient has been coughing more. They have noticed some brownish expectoration. Patient denies any shortness of breath, or chest pain, but family has seen that the patient has been short of breath. No nausea, vomiting, although they have noticed a few episodes of diarrhea. He's lost about 40 pounds since his fracture. He is also been warm to touch in the house. Denies any headaches. Apparently, because of the weight loss, he has been given some medications to stimulate his appetite. This morning patient was quite short of breath and confused, incoherent. He was found to be tachycardic at home and, so EMS was called and the patient was brought into the hospital. Patient currently knows he is at [facility]. He could tell me the month and the year. He could identify his son and his daughter.  Home Medications: Prior to Admission medications   Medication Sig Start Date End Date Taking? Authorizing Provider  albuterol (PROVENTIL) (2.5 MG/3ML) 0.083% nebulizer solution Take 2.5 mg by nebulization every 6 (six) hours as needed for wheezing or shortness of breath.   Yes  Historical Provider, MD  buPROPion (WELLBUTRIN SR) 100 MG 12 hr tablet Take 100 mg by mouth daily.   Yes Historical Provider, MD  cholecalciferol (VITAMIN D) 1000 UNITS tablet Take 1,000 Units by mouth daily.   Yes Historical Provider, MD  dextromethorphan 15 MG/5ML syrup Take 10 mLs by mouth 3 (three) times daily as needed for cough.   Yes Historical Provider, MD  digoxin (LANOXIN) 0.125 MG tablet Take 1 tablet (0.125 mg total) by mouth daily. 01/28/13  Yes Ripudeep Krystal Eaton, MD  feeding supplement, ENSURE COMPLETE, (ENSURE COMPLETE) LIQD Take 237 mLs by mouth 2 (two) times daily between meals. 01/27/13  Yes Ripudeep Krystal Eaton, MD  ferrous sulfate 325 (65 FE) MG tablet Take 325 mg by mouth daily with breakfast.   Yes Historical Provider, MD  FLUoxetine (PROZAC) 20 MG capsule Take 20 mg by mouth daily.   Yes Historical Provider, MD  levothyroxine (SYNTHROID, LEVOTHROID) 75 MCG tablet Take 75 mcg by mouth daily before breakfast.   Yes Historical Provider, MD  lovastatin (MEVACOR) 40 MG tablet Take 40 mg by mouth at bedtime.   Yes Historical Provider, MD  methocarbamol (ROBAXIN) 500 MG tablet Take 1 tablet (500 mg total) by mouth every 6 (six) hours as needed (muscle spasms). 01/27/13  Yes Ripudeep Krystal Eaton, MD  Multiple Vitamin (MULTIVITAMIN WITH MINERALS) TABS tablet Take 1 tablet by mouth daily.   Yes Historical Provider, MD  pantoprazole (PROTONIX) 40 MG tablet Take 1 tablet (40 mg total) by mouth daily. 01/27/13  Yes Ripudeep Krystal Eaton, MD  potassium chloride SA (K-DUR,KLOR-CON) 20 MEQ  tablet Take 20 mEq by mouth daily.   Yes Historical Provider, MD  Rivaroxaban (XARELTO) 20 MG TABS Take 20 mg by mouth daily.   Yes Historical Provider, MD  trolamine salicylate (ASPERCREME) 10 % cream Apply 1 application topically as needed (for muscle pain).   Yes Historical Provider, MD  Piperacillin Sod-Tazobactam So (ZOSYN IV) Inject 3.375 g into the vein every 8 (eight) hours. 04/21/13   Historical Provider, MD  vancomycin  (VANCOCIN) 1000 MG injection Inject 1,000 mg into the vein daily. 04/21/13   Historical Provider, MD    Allergies: No Known Allergies  Past Medical History: Past Medical History  Diagnosis Date  . Hypertension   . Hyperlipidemia   . AAA (abdominal aortic aneurysm)   . Arthritis   . CAD (coronary artery disease)   . Thyroid disease     hypothyroidism  . Dysrhythmia     atrial flutter  . Shortness of breath     a little with exertion  . Polio 1934  . Atrial flutter     chronic anticoagulation  . COPD (chronic obstructive pulmonary disease)   . History of nuclear stress test 04/2010    dipyridamole; small, mostly fixed basal to mid inferior and inferoseptal defect (gut artifact v. scar); post stress EF 62%; non-diagnostic for ischemia; low risk     Past Surgical History  Procedure Laterality Date  . Abdominal aortic aneurysm repair  05/21/2010    aorto bi-iliac BPG  . Appendectomy  1949  . Popliteal synovial cyst excision Left   . Joint replacement      Knee and Hip replacements  . Total hip arthroplasty Left 1994  . Knee arthroplasty Left 2004  . Total hip revision Left 12/04/2012    Procedure: LEFT TOTAL HIP REVISION;  Surgeon: Mauri Pole, MD;  Location: [facility];  Service: Orthopedics;  Laterality: Left;  . Cardiac catheterization  2009    in [place]; 50-60% RCA  . Transthoracic echocardiogram  04/2010    EF =>55%; RV borderline dilated; LA mildly dilated; mild mitral annular calcif & mild MR; mild TR; mild calcif of AV leaflets with mild AV stenosis; aortic root sclerosis/calcif  . Orif femur fracture Left 01/22/2013    Procedure: OPEN REDUCTION INTERNAL FIXATION (ORIF) DISTAL FEMUR FRACTURE;  Surgeon: Mauri Pole, MD;  Location: [place];  Service: Orthopedics;  Laterality: Left;    Social History: He recently came to live with his daughter from a skilled nursing facility. He is on home oxygen. Quit smoking about 40 years ago. He was drinking alcohol up until 3 months ago, and  he gave up completely. He has been nonweightbearing since his recent fractured. He supposed to have a followup with Dr. Alvan Dame this month.  Family History:  Family History  Problem Relation Age of Onset  . Other Father     varicose veins  . Hyperlipidemia Son   . Hypertension Son   . AAA (abdominal aortic aneurysm) Son   . Lung cancer Brother      Review of Systems - Unable due to acute encephalopathy  Physical Examination  Filed Vitals:   05/03/13 1630 05/03/13 1700 05/03/13 1730 05/03/13 1745  BP: 101/65 103/63 111/68 105/62  Pulse: 122 121 121 123  Temp:      TempSrc:      Resp: 26 24 24 23   Height:      Weight:      SpO2: 96% 95% 97% 98%    General appearance: alert, cooperative,  appears stated age and no distress Head: Normocephalic, without obvious abnormality, atraumatic Eyes: conjunctivae/corneas clear. PERRL, EOM's intact.  Throat: dry mm Neck: no adenopathy, no carotid bruit, no JVD, supple, symmetrical, trachea midline and thyroid not enlarged, symmetric, no tenderness/mass/nodules Back: symmetric, no curvature. ROM normal. No CVA tenderness. Resp: Coarse breath sounds bilaterally with crackles in the left side. Few scattered wheezes. Cardiac: S1, S2 is tachycardia, regular. No S3, S4. No rubs, murmurs, or bruit. No pedal edema. No JVD. GI: soft, non-tender; bowel sounds normal; no masses,  no organomegaly Extremities: extremities normal, atraumatic, no cyanosis or edema Pulses: 2+ and symmetric Skin: Skin color, texture, turgor normal. No rashes or lesions Lymph nodes: Cervical, supraclavicular, and axillary nodes normal. Neurologic: He is alert. He is oriented to place, person, month and year. No cranial nerve deficits identified. No motor strength deficits. Bilateral upper and lower extremities. Gait was not assessed.  Laboratory Data: Results for orders placed during the hospital encounter of 05/03/13 (from the past 48 hour(s))  COMPREHENSIVE METABOLIC PANEL      Status: Abnormal   Collection Time    05/03/13  1:19 PM      Result Value Range   Sodium 134 (*) 137 - 147 mEq/L   Potassium 3.4 (*) 3.7 - 5.3 mEq/L   Chloride 97  96 - 112 mEq/L   CO2 24  19 - 32 mEq/L   Glucose, Bld 108 (*) 70 - 99 mg/dL   BUN 9  6 - 23 mg/dL   Creatinine, Ser 0.67  0.50 - 1.35 mg/dL   Calcium 8.7  8.4 - 10.5 mg/dL   Total Protein 6.5  6.0 - 8.3 g/dL   Albumin 2.3 (*) 3.5 - 5.2 g/dL   AST 20  0 - 37 U/L   ALT 17  0 - 53 U/L   Alkaline Phosphatase 94  39 - 117 U/L   Total Bilirubin 0.7  0.3 - 1.2 mg/dL   GFR calc non Af Amer 86 (*) >90 mL/min   GFR calc Af Amer >90  >90 mL/min   Comment: (NOTE)     The eGFR has been calculated using the CKD EPI equation.     This calculation has not been validated in all clinical situations.     eGFR's persistently <90 mL/min signify possible Chronic Kidney     Disease.  LIPASE, BLOOD     Status: None   Collection Time    05/03/13  1:19 PM      Result Value Range   Lipase 12  11 - 59 U/L  CBC WITH DIFFERENTIAL     Status: Abnormal   Collection Time    05/03/13  1:19 PM      Result Value Range   WBC 18.7 (*) 4.0 - 10.5 K/uL   RBC 4.18 (*) 4.22 - 5.81 MIL/uL   Hemoglobin 12.3 (*) 13.0 - 17.0 g/dL   HCT 36.7 (*) 39.0 - 52.0 %   MCV 87.8  78.0 - 100.0 fL   MCH 29.4  26.0 - 34.0 pg   MCHC 33.5  30.0 - 36.0 g/dL   RDW 16.5 (*) 11.5 - 15.5 %   Platelets 211  150 - 400 K/uL   Neutrophils Relative % 84 (*) 43 - 77 %   Neutro Abs 15.7 (*) 1.7 - 7.7 K/uL   Lymphocytes Relative 7 (*) 12 - 46 %   Lymphs Abs 1.3  0.7 - 4.0 K/uL   Monocytes Relative 9  3 -  12 %   Monocytes Absolute 1.6 (*) 0.1 - 1.0 K/uL   Eosinophils Relative 0  0 - 5 %   Eosinophils Absolute 0.0  0.0 - 0.7 K/uL   Basophils Relative 0  0 - 1 %   Basophils Absolute 0.0  0.0 - 0.1 K/uL  PRO B NATRIURETIC PEPTIDE     Status: Abnormal   Collection Time    05/03/13  1:19 PM      Result Value Range   Pro B Natriuretic peptide (BNP) 622.4 (*) 0 - 450 pg/mL   TROPONIN I     Status: None   Collection Time    05/03/13  1:19 PM      Result Value Range   Troponin I <0.30  <0.30 ng/mL   Comment:            Due to the release kinetics of cTnI,     a negative result within the first hours     of the onset of symptoms does not rule out     myocardial infarction with certainty.     If myocardial infarction is still suspected,     repeat the test at appropriate intervals.  URINALYSIS, ROUTINE W REFLEX MICROSCOPIC     Status: Abnormal   Collection Time    05/03/13  3:44 PM      Result Value Range   Color, Urine AMBER (*) YELLOW   Comment: BIOCHEMICALS MAY BE AFFECTED BY COLOR   APPearance CLEAR  CLEAR   Specific Gravity, Urine 1.023  1.005 - 1.030   pH 5.0  5.0 - 8.0   Glucose, UA NEGATIVE  NEGATIVE mg/dL   Hgb urine dipstick NEGATIVE  NEGATIVE   Bilirubin Urine NEGATIVE  NEGATIVE   Ketones, ur NEGATIVE  NEGATIVE mg/dL   Protein, ur NEGATIVE  NEGATIVE mg/dL   Urobilinogen, UA 0.2  0.0 - 1.0 mg/dL   Nitrite NEGATIVE  NEGATIVE   Leukocytes, UA NEGATIVE  NEGATIVE   Comment: MICROSCOPIC NOT DONE ON URINES WITH NEGATIVE PROTEIN, BLOOD, LEUKOCYTES, NITRITE, OR GLUCOSE <1000 mg/dL.  PROTIME-INR     Status: Abnormal   Collection Time    05/03/13  4:00 PM      Result Value Range   Prothrombin Time 19.4 (*) 11.6 - 15.2 seconds   INR 1.69 (*) 0.00 - 1.49  POCT I-STAT 3, BLOOD GAS (G3P V)     Status: Abnormal   Collection Time    05/03/13  5:29 PM      Result Value Range   pH, Ven 7.441 (*) 7.250 - 7.300   pCO2, Ven 36.5 (*) 45.0 - 50.0 mmHg   pO2, Ven 50.0 (*) 30.0 - 45.0 mmHg   Bicarbonate 24.9 (*) 20.0 - 24.0 mEq/L   TCO2 26  0 - 100 mmol/L   O2 Saturation 87.0     Acid-Base Excess 1.0  0.0 - 2.0 mmol/L   Sample type VENOUS    CG4 I-STAT (LACTIC ACID)     Status: None   Collection Time    05/03/13  5:30 PM      Result Value Range   Lactic Acid, Venous 1.42  0.5 - 2.2 mmol/L    Radiology Reports: Ct Head Wo Contrast  05/03/2013    CLINICAL DATA:  Fever and confusion  EXAM: CT HEAD WITHOUT CONTRAST  TECHNIQUE: Contiguous axial images were obtained from the base of the skull through the vertex without intravenous contrast. Study was obtained within 24 hr of patient's arrival  at the emergency department.  COMPARISON:  None.  FINDINGS: There is mild diffuse atrophy. There is a small focus of increased attenuation in the posterior left parafalcine region 3 measuring 1.4 x 1.1 cm, felt 1 to represent a partially calcified meningioma. There is no surrounding edema in this area. There is no other evidence of mass. There is no appreciable acute hemorrhage. There is no extra-axial fluid or midline shift.  There is patchy small vessel disease in the centra semiovale bilaterally. There is decreased attenuation throughout the left pons compared to the right, concerning for recent infarct. No other findings felt to represent potential acute infarct identified.  The bony calvarium appears intact. The mastoid air cells are clear. There is mucosal thickening in the left maxillary antrum.  IMPRESSION: Suspect recent infarct in the left pons.  Atrophy with periventricular small vessel disease.  Small partially calcified meningioma posterior left parafalcine region. No associated edema or mass effect.  Left maxillary sinus disease.   Electronically Signed   By: Lowella Grip M.D.   On: 05/03/2013 13:54   Dg Chest Port 1 View  05/03/2013   CLINICAL DATA:  Fever.  EXAM: PORTABLE CHEST - 1 VIEW  COMPARISON:  January 26, 2013.  FINDINGS: Stable cardiomediastinal silhouette. New linear density seen in left perihilar region concerning for subsegmental listhesis or possibly early pneumonia. No pleural effusion or pneumothorax is noted. Stable bilateral interstitial opacities are noted most consistent with scarring.  IMPRESSION: New linear opacity seen in left midlung concerning for subsegmental atelectasis or possibly pneumonia. Followup radiographs are  recommended.   Electronically Signed   By: Sabino Dick M.D.   On: 05/03/2013 13:44    Electrocardiogram: Difficult rhythm to interpret. Could be sinus tachycardia, but could also be atrial flutter. 120 beats per minute. No acute ischemic changes. PVCs are noted.  Problem List  Principal Problem:   HCAP (healthcare-associated pneumonia) Active Problems:   Chronic atrial flutter   Coronary artery disease   Essential hypertension   Acute encephalopathy   CVA (cerebrovascular accident)   Assessment: This is 80 year old, Caucasian male, presents with fever, altered mental status, worsening cough. Chest x-ray suggests pneumonia in the left lung. According to the son patient had bilateral pneumonia at the SNF and then subsequently, was found to have right-sided pneumonia. So, this could be a new finding. This is most likely healthcare associated. Differential diagnoses include influenza. Considering his diarrhea he could have C. difficile as well. However, his abdomen is completely benign. He also has an abnormal CT head.  Plan: #1 healthcare associated pneumonia: He'll be few vancomycin and cefepime and tamiflu. Await influenza PCR. Blood cultures have been obtained. He'll be maintained on oxygen. Since he is tachycardic he'll be observed in step down unit for tonight. Lactic acid, reassuringly, is normal.  #2 abnormal CT head with possible recent stroke: He does not have any focal deficits at this time. Daughter did mention some coughing spells with liquids. So, we'll get a swallow screen. We'll get MRI of the brain. Echocardiogram and carotid Dopplers will be obtained. This likely happened at least a few weeks ago and so not a tpa candidate.   #3 tachycardia: Could be sinus tach versus atrial flutter. Due to fever and dehydration. He'll be monitored on telemetry. He'll be given IV fluids. He is on digoxin and a dig Level will be checked. If heart rate doesn't get controlled may have to utilize  other rate limiting drugs.   #4 acute encephalopathy: No focal  deficits. No meningeal signs. Most likely due to infection. Continue to monitor. Await MRI brain.  #5 on chronic anticoagulation with Xarelto: This will be continued.  #6 History of hypothyroidism: Continue with l-thyroxine.  #7 recent left femur fracture: He continues to be nonweightbearing on the left lower extremity. He has a followup with his orthopedic surgeon (Dr. Alvan Dame) sometime this month.   DVT Prophylaxis: He is on full anticoagulation Code Status: Full code Family Communication: Discussed with the son and daughter  Disposition Plan: Admit to step down   Further management decisions will depend on results of further testing and patient's response to treatment.  [facility]  [facility] Pager [phone]  If 7PM-7AM, please contact night-coverage [email or web address] Password [facility]  05/03/2013, 6:39 PM

## 2013-05-03 NOTE — ED Notes (Signed)
Fever noticed this am by NH staff.  + Weakness. + Diarrhea.  Given NS 548ml's and Tylenol 1gram PO by EMS.

## 2013-05-03 NOTE — ED Notes (Signed)
The family was at the bedside, but left to the cafeteria.  I attempted to call the son to ask some questions but was unable to contact him.

## 2013-05-03 NOTE — ED Notes (Signed)
Patient transported to CT 

## 2013-05-03 NOTE — ED Notes (Signed)
Attempt to call report nurse not available 

## 2013-05-03 NOTE — ED Notes (Signed)
Attempt to call report states charge nurse unavailable.

## 2013-05-03 NOTE — ED Provider Notes (Signed)
CSN: [id number]     Arrival date & time 05/03/13  1159 History   First MD Initiated Contact with Patient 05/03/13 1211     Chief Complaint  Patient presents with  . Fever   (Consider location/radiation/quality/duration/timing/severity/associated sxs/prior Treatment) Patient is a 80 y.o. male presenting with fever. The history is provided by the patient and a relative. The history is limited by the condition of the patient.  Fever Associated symptoms: confusion and diarrhea    level V caveat applies due to confusion and altered mental status.  Patient with recent pneumonia stopped IV antibiotics at home on Tuesday was doing fine for a couple of days. Patient during the night became confused. Fever and the one episode of diarrhea bowel movement. Patient had been doing quite well until a fall back in October that resulted in fracture of the left hip at a site where he had a hip replacement. Patient was in rehabilitation and nursing home facility until a few days ago. Brought home and has home health nurse care. Patient was recently treated for pneumonia.  Past Medical History  Diagnosis Date  . Hypertension   . Hyperlipidemia   . AAA (abdominal aortic aneurysm)   . Arthritis   . CAD (coronary artery disease)   . Thyroid disease     hypothyroidism  . Dysrhythmia     atrial flutter  . Shortness of breath     a little with exertion  . Polio 1934  . Atrial flutter     chronic anticoagulation  . COPD (chronic obstructive pulmonary disease)   . History of nuclear stress test 04/2010    dipyridamole; small, mostly fixed basal to mid inferior and inferoseptal defect (gut artifact v. scar); post stress EF 62%; non-diagnostic for ischemia; low risk    Past Surgical History  Procedure Laterality Date  . Abdominal aortic aneurysm repair  05/21/2010    aorto bi-iliac BPG  . Appendectomy  1949  . Popliteal synovial cyst excision Left   . Joint replacement      Knee and Hip replacements  .  Total hip arthroplasty Left 1994  . Knee arthroplasty Left 2004  . Total hip revision Left 12/04/2012    Procedure: LEFT TOTAL HIP REVISION;  Surgeon: Mauri Pole, MD;  Location: [facility];  Service: Orthopedics;  Laterality: Left;  . Cardiac catheterization  2009    in [place]; 50-60% RCA  . Transthoracic echocardiogram  04/2010    EF =>55%; RV borderline dilated; LA mildly dilated; mild mitral annular calcif & mild MR; mild TR; mild calcif of AV leaflets with mild AV stenosis; aortic root sclerosis/calcif  . Orif femur fracture Left 01/22/2013    Procedure: OPEN REDUCTION INTERNAL FIXATION (ORIF) DISTAL FEMUR FRACTURE;  Surgeon: Mauri Pole, MD;  Location: [place];  Service: Orthopedics;  Laterality: Left;   Family History  Problem Relation Age of Onset  . Other Father     varicose veins  . Hyperlipidemia Son   . Hypertension Son   . AAA (abdominal aortic aneurysm) Son   . Lung cancer Brother    History  Substance Use Topics  . Smoking status: Former Smoker -- 1.00 packs/day for 25 years    Types: Cigarettes    Quit date: 04/19/1966  . Smokeless tobacco: Never Used     Comment: 100 pack years per 08/2012 note  . Alcohol Use: 0.0 oz/week     Comment: glass of wine daily, scotch 3 or 4 times a week  Review of Systems  Unable to perform ROS Constitutional: Positive for fever.  Gastrointestinal: Positive for diarrhea.  Psychiatric/Behavioral: Positive for confusion.   level V caveat applies due to the patient's altered mental status.  Allergies  Review of patient's allergies indicates no known allergies.  Home Medications   Current Outpatient Rx  Name  Route  Sig  Dispense  Refill  . albuterol (PROVENTIL) (2.5 MG/3ML) 0.083% nebulizer solution   Nebulization   Take 2.5 mg by nebulization every 6 (six) hours as needed for wheezing or shortness of breath.         [name] buPROPion (WELLBUTRIN SR) 100 MG 12 hr tablet   Oral   Take 100 mg by mouth daily.         .  cholecalciferol (VITAMIN D) 1000 UNITS tablet   Oral   Take 1,000 Units by mouth daily.         [name] dextromethorphan 15 MG/5ML syrup   Oral   Take 10 mLs by mouth 3 (three) times daily as needed for cough.         . digoxin (LANOXIN) 0.125 MG tablet   Oral   Take 1 tablet (0.125 mg total) by mouth daily.         . feeding supplement, ENSURE COMPLETE, (ENSURE COMPLETE) LIQD   Oral   Take 237 mLs by mouth 2 (two) times daily between meals.         . ferrous sulfate 325 (65 FE) MG tablet   Oral   Take 325 mg by mouth daily with breakfast.         . FLUoxetine (PROZAC) 20 MG capsule   Oral   Take 20 mg by mouth daily.         [name] levothyroxine (SYNTHROID, LEVOTHROID) 75 MCG tablet   Oral   Take 75 mcg by mouth daily before breakfast.         . lovastatin (MEVACOR) 40 MG tablet   Oral   Take 40 mg by mouth at bedtime.         . methocarbamol (ROBAXIN) 500 MG tablet   Oral   Take 1 tablet (500 mg total) by mouth every 6 (six) hours as needed (muscle spasms).   30 tablet   0   . Multiple Vitamin (MULTIVITAMIN WITH MINERALS) TABS tablet   Oral   Take 1 tablet by mouth daily.         . pantoprazole (PROTONIX) 40 MG tablet   Oral   Take 1 tablet (40 mg total) by mouth daily.         . potassium chloride SA (K-DUR,KLOR-CON) 20 MEQ tablet   Oral   Take 20 mEq by mouth daily.         . Rivaroxaban (XARELTO) 20 MG TABS   Oral   Take 20 mg by mouth daily.         [name] trolamine salicylate (ASPERCREME) 10 % cream   Topical   Apply 1 application topically as needed (for muscle pain).         . Piperacillin Sod-Tazobactam So (ZOSYN IV)   Intravenous   Inject 3.375 g into the vein every 8 (eight) hours.         . vancomycin (VANCOCIN) 1000 MG injection   Intravenous   Inject 1,000 mg into the vein daily.          BP 106/70  Pulse 121  Temp(Src) 100.6 F (38.1 C) (Rectal)  Resp 21  Ht 6' 5.17" (1.96 m)  Wt 286 lb 9.6 oz (130 kg)  BMI 33.84  kg/m2  SpO2 94% Physical Exam  Nursing note and vitals reviewed. Constitutional: He appears well-developed and well-nourished. No distress.  HENT:  Head: Normocephalic and atraumatic.  Mucous membranes are dry  Eyes: Conjunctivae are normal. Pupils are equal, round, and reactive to light.  Neck: Normal range of motion. Neck supple.  Cardiovascular: Regular rhythm.   Tachycardic  Pulmonary/Chest: Effort normal and breath sounds normal. No respiratory distress.  Abdominal: Soft. Bowel sounds are normal. There is no tenderness.  Musculoskeletal: Normal range of motion. He exhibits no edema.  Neurological: No cranial nerve deficit. He exhibits normal muscle tone. Coordination normal.  Skin: Skin is warm. No rash noted.    ED Course  Procedures (including critical care time) Labs Review Labs Reviewed  COMPREHENSIVE METABOLIC PANEL - Abnormal; Notable for the following:    Sodium 134 (*)    Potassium 3.4 (*)    Glucose, Bld 108 (*)    Albumin 2.3 (*)    GFR calc non Af Amer 86 (*)    All other components within normal limits  URINALYSIS, ROUTINE W REFLEX MICROSCOPIC - Abnormal; Notable for the following:    Color, Urine AMBER (*)    All other components within normal limits  CBC WITH DIFFERENTIAL - Abnormal; Notable for the following:    WBC 18.7 (*)    RBC 4.18 (*)    Hemoglobin 12.3 (*)    HCT 36.7 (*)    RDW 16.5 (*)    Neutrophils Relative % 84 (*)    Neutro Abs 15.7 (*)    Lymphocytes Relative 7 (*)    Monocytes Absolute 1.6 (*)    All other components within normal limits  PRO B NATRIURETIC PEPTIDE - Abnormal; Notable for the following:    Pro B Natriuretic peptide (BNP) 622.4 (*)    All other components within normal limits  PROTIME-INR - Abnormal; Notable for the following:    Prothrombin Time 19.4 (*)    INR 1.69 (*)    All other components within normal limits  POCT I-STAT 3, BLOOD GAS (G3P V) - Abnormal; Notable for the following:    pH, Ven 7.441 (*)     pCO2, Ven 36.5 (*)    pO2, Ven 50.0 (*)    Bicarbonate 24.9 (*)    All other components within normal limits  CULTURE, BLOOD (ROUTINE X 2)  CULTURE, BLOOD (ROUTINE X 2)  CLOSTRIDIUM DIFFICILE BY PCR  LIPASE, BLOOD  TROPONIN I  URINALYSIS, ROUTINE W REFLEX MICROSCOPIC  INFLUENZA PANEL BY PCR (TYPE A & B, H1N1)  CG4 I-STAT (LACTIC ACID)   Results for orders placed during the hospital encounter of 05/03/13  COMPREHENSIVE METABOLIC PANEL      Result Value Range   Sodium 134 (*) 137 - 147 mEq/L   Potassium 3.4 (*) 3.7 - 5.3 mEq/L   Chloride 97  96 - 112 mEq/L   CO2 24  19 - 32 mEq/L   Glucose, Bld 108 (*) 70 - 99 mg/dL   BUN 9  6 - 23 mg/dL   Creatinine, Ser 0.67  0.50 - 1.35 mg/dL   Calcium 8.7  8.4 - 10.5 mg/dL   Total Protein 6.5  6.0 - 8.3 g/dL   Albumin 2.3 (*) 3.5 - 5.2 g/dL   AST 20  0 - 37 U/L   ALT 17  0 - 53 U/L   Alkaline Phosphatase 94  39 - 117 U/L   Total Bilirubin 0.7  0.3 - 1.2 mg/dL   GFR calc non Af Amer 86 (*) >90 mL/min   GFR calc Af Amer >90  >90 mL/min  LIPASE, BLOOD      Result Value Range   Lipase 12  11 - 59 U/L  URINALYSIS, ROUTINE W REFLEX MICROSCOPIC      Result Value Range   Color, Urine AMBER (*) YELLOW   APPearance CLEAR  CLEAR   Specific Gravity, Urine 1.023  1.005 - 1.030   pH 5.0  5.0 - 8.0   Glucose, UA NEGATIVE  NEGATIVE mg/dL   Hgb urine dipstick NEGATIVE  NEGATIVE   Bilirubin Urine NEGATIVE  NEGATIVE   Ketones, ur NEGATIVE  NEGATIVE mg/dL   Protein, ur NEGATIVE  NEGATIVE mg/dL   Urobilinogen, UA 0.2  0.0 - 1.0 mg/dL   Nitrite NEGATIVE  NEGATIVE   Leukocytes, UA NEGATIVE  NEGATIVE  CBC WITH DIFFERENTIAL      Result Value Range   WBC 18.7 (*) 4.0 - 10.5 K/uL   RBC 4.18 (*) 4.22 - 5.81 MIL/uL   Hemoglobin 12.3 (*) 13.0 - 17.0 g/dL   HCT 36.7 (*) 39.0 - 52.0 %   MCV 87.8  78.0 - 100.0 fL   MCH 29.4  26.0 - 34.0 pg   MCHC 33.5  30.0 - 36.0 g/dL   RDW 16.5 (*) 11.5 - 15.5 %   Platelets 211  150 - 400 K/uL   Neutrophils Relative %  84 (*) 43 - 77 %   Neutro Abs 15.7 (*) 1.7 - 7.7 K/uL   Lymphocytes Relative 7 (*) 12 - 46 %   Lymphs Abs 1.3  0.7 - 4.0 K/uL   Monocytes Relative 9  3 - 12 %   Monocytes Absolute 1.6 (*) 0.1 - 1.0 K/uL   Eosinophils Relative 0  0 - 5 %   Eosinophils Absolute 0.0  0.0 - 0.7 K/uL   Basophils Relative 0  0 - 1 %   Basophils Absolute 0.0  0.0 - 0.1 K/uL  PRO B NATRIURETIC PEPTIDE      Result Value Range   Pro B Natriuretic peptide (BNP) 622.4 (*) 0 - 450 pg/mL  TROPONIN I      Result Value Range   Troponin I <0.30  <0.30 ng/mL  PROTIME-INR      Result Value Range   Prothrombin Time 19.4 (*) 11.6 - 15.2 seconds   INR 1.69 (*) 0.00 - 1.49  CG4 I-STAT (LACTIC ACID)      Result Value Range   Lactic Acid, Venous 1.42  0.5 - 2.2 mmol/L  POCT I-STAT 3, BLOOD GAS (G3P V)      Result Value Range   pH, Ven 7.441 (*) 7.250 - 7.300   pCO2, Ven 36.5 (*) 45.0 - 50.0 mmHg   pO2, Ven 50.0 (*) 30.0 - 45.0 mmHg   Bicarbonate 24.9 (*) 20.0 - 24.0 mEq/L   TCO2 26  0 - 100 mmol/L   O2 Saturation 87.0     Acid-Base Excess 1.0  0.0 - 2.0 mmol/L   Sample type VENOUS      Imaging Review Ct Head Wo Contrast  05/03/2013   CLINICAL DATA:  Fever and confusion  EXAM: CT HEAD WITHOUT CONTRAST  TECHNIQUE: Contiguous axial images were obtained from the base of the skull through the vertex without intravenous contrast. Study was obtained within 24 hr of patient's arrival at the emergency department.  COMPARISON:  None.  FINDINGS: There is mild diffuse atrophy. There is a small focus of increased attenuation in the posterior left parafalcine region 3 measuring 1.4 x 1.1 cm, felt 1 to represent a partially calcified meningioma. There is no surrounding edema in this area. There is no other evidence of mass. There is no appreciable acute hemorrhage. There is no extra-axial fluid or midline shift.  There is patchy small vessel disease in the centra semiovale bilaterally. There is decreased attenuation throughout the left  pons compared to the right, concerning for recent infarct. No other findings felt to represent potential acute infarct identified.  The bony calvarium appears intact. The mastoid air cells are clear. There is mucosal thickening in the left maxillary antrum.  IMPRESSION: Suspect recent infarct in the left pons.  Atrophy with periventricular small vessel disease.  Small partially calcified meningioma posterior left parafalcine region. No associated edema or mass effect.  Left maxillary sinus disease.   Electronically Signed   By: Lowella Grip M.D.   On: 05/03/2013 13:54   Dg Chest Port 1 View  05/03/2013   CLINICAL DATA:  Fever.  EXAM: PORTABLE CHEST - 1 VIEW  COMPARISON:  January 26, 2013.  FINDINGS: Stable cardiomediastinal silhouette. New linear density seen in left perihilar region concerning for subsegmental listhesis or possibly early pneumonia. No pleural effusion or pneumothorax is noted. Stable bilateral interstitial opacities are noted most consistent with scarring.  IMPRESSION: New linear opacity seen in left midlung concerning for subsegmental atelectasis or possibly pneumonia. Followup radiographs are recommended.   Electronically Signed   By: Sabino Dick M.D.   On: 05/03/2013 13:44    EKG Interpretation   None      Date: 05/03/2013  Rate: 119  Rhythm: sinus tachycardia and premature ventricular contractions (PVC)  QRS Axis: normal  Intervals: normal  ST/T Wave abnormalities: early repolarization  Conduction Disutrbances:nonspecific intraventricular conduction delay  Narrative Interpretation:   Old EKG Reviewed: none available Fair amount of artifact.   MDM   1. HCAP (healthcare-associated pneumonia)   2. Altered mental status   3. CVA (cerebral infarction)    Patient with worsening mental status weakness and occasional diarrhea starting overnight. Patient came from home is not from the nursing home. Recently had treatment for pneumonia IV antibiotics that were being  given at home were stopped on Tuesday. Patient today with persistent tachycardia not hypotensive like a gas and not elevated. Could be early sepsis. Chest x-ray suggestive of developing pneumonia may be recurrent from the same place from before. Started on healthcare acquired pneumonia antibiotics. Cultures done prior to that. Patient will require admission. Patient also with decreased mental status head CT shows question of a pontine stroke. Patient with marked leukocytosis suggestive of some sort of infection. Urinalysis negative. Patient will require admission. Most likely step down type bed.  Venous ABG shows no significant acidosis. Patient oxygen saturations on 2 L of oxygen is in the mid 90s. Patient's primary care doctor is at [facility] technically an unassigned admission.  C. diff pending influenza panel pending  Patient will be admitted by the hospitalist service to step down.  Mervin Kung, MD 05/03/13 1800

## 2013-05-04 ENCOUNTER — Inpatient Hospital Stay (HOSPITAL_COMMUNITY): Payer: Medicare Other

## 2013-05-04 DIAGNOSIS — A0472 Enterocolitis due to Clostridium difficile, not specified as recurrent: Secondary | ICD-10-CM

## 2013-05-04 DIAGNOSIS — I359 Nonrheumatic aortic valve disorder, unspecified: Secondary | ICD-10-CM

## 2013-05-04 DIAGNOSIS — R4182 Altered mental status, unspecified: Secondary | ICD-10-CM

## 2013-05-04 DIAGNOSIS — E43 Unspecified severe protein-calorie malnutrition: Secondary | ICD-10-CM | POA: Diagnosis present

## 2013-05-04 LAB — COMPREHENSIVE METABOLIC PANEL
ALBUMIN: 2.1 g/dL — AB (ref 3.5–5.2)
ALT: 12 U/L (ref 0–53)
AST: 15 U/L (ref 0–37)
Alkaline Phosphatase: 130 U/L — ABNORMAL HIGH (ref 39–117)
BUN: 8 mg/dL (ref 6–23)
CALCIUM: 8.4 mg/dL (ref 8.4–10.5)
CO2: 23 mEq/L (ref 19–32)
CREATININE: 0.59 mg/dL (ref 0.50–1.35)
Chloride: 101 mEq/L (ref 96–112)
GFR calc Af Amer: >90 mL/min (ref >90)
Glucose, Bld: 88 mg/dL (ref 70–99)
Potassium: 3.6 mEq/L — ABNORMAL LOW (ref 3.7–5.3)
Sodium: 135 mEq/L — ABNORMAL LOW (ref 137–147)
Total Bilirubin: 0.6 mg/dL (ref 0.3–1.2)
Total Protein: 6 g/dL (ref 6.0–8.3)

## 2013-05-04 LAB — HEMOGLOBIN A1C
Hgb A1c MFr Bld: 6.1 % — ABNORMAL HIGH (ref <5.7)
Mean Plasma Glucose: 128 mg/dL — ABNORMAL HIGH (ref <117)

## 2013-05-04 LAB — CBC
HCT: 33.7 % — ABNORMAL LOW (ref 39.0–52.0)
HEMOGLOBIN: 11.3 g/dL — AB (ref 13.0–17.0)
MCH: 29.6 pg (ref 26.0–34.0)
MCHC: 33.5 g/dL (ref 30.0–36.0)
MCV: 88.2 fL (ref 78.0–100.0)
Platelets: 192 10*3/uL (ref 150–400)
RBC: 3.82 MIL/uL — ABNORMAL LOW (ref 4.22–5.81)
RDW: 16.4 % — ABNORMAL HIGH (ref 11.5–15.5)
WBC: 17.7 10*3/uL — ABNORMAL HIGH (ref 4.0–10.5)

## 2013-05-04 LAB — HIV ANTIBODY (ROUTINE TESTING W REFLEX): HIV: NONREACTIVE

## 2013-05-04 LAB — MRSA PCR SCREENING: MRSA BY PCR: NEGATIVE

## 2013-05-04 LAB — CLOSTRIDIUM DIFFICILE BY PCR: CDIFFPCR: POSITIVE — AB

## 2013-05-04 LAB — STREP PNEUMONIAE URINARY ANTIGEN: STREP PNEUMO URINARY ANTIGEN: NEGATIVE

## 2013-05-04 MED ORDER — ADULT MULTIVITAMIN W/MINERALS CH
1.0000 | ORAL_TABLET | Freq: Every day | ORAL | Status: DC
  Administered 2013-05-04 – 2013-05-07 (×4): 1 via ORAL
  Filled 2013-05-04 (×4): qty 1

## 2013-05-04 MED ORDER — VANCOMYCIN 50 MG/ML ORAL SOLUTION
125.0000 mg | Freq: Four times a day (QID) | ORAL | Status: DC
  Administered 2013-05-04 – 2013-05-06 (×7): 125 mg via ORAL
  Filled 2013-05-04 (×10): qty 2.5

## 2013-05-04 MED ORDER — POTASSIUM CHLORIDE CRYS ER 20 MEQ PO TBCR
40.0000 meq | EXTENDED_RELEASE_TABLET | Freq: Once | ORAL | Status: AC
  Administered 2013-05-04: 40 meq via ORAL
  Filled 2013-05-04: qty 2

## 2013-05-04 MED ORDER — BOOST / RESOURCE BREEZE PO LIQD
1.0000 | Freq: Two times a day (BID) | ORAL | Status: DC
  Administered 2013-05-04 – 2013-05-07 (×6): 1 via ORAL

## 2013-05-04 MED ORDER — CHLORHEXIDINE GLUCONATE 0.12 % MT SOLN
15.0000 mL | Freq: Two times a day (BID) | OROMUCOSAL | Status: DC
  Administered 2013-05-04 – 2013-05-06 (×4): 15 mL via OROMUCOSAL
  Filled 2013-05-04 (×8): qty 15

## 2013-05-04 MED ORDER — BIOTENE DRY MOUTH MT LIQD
15.0000 mL | Freq: Two times a day (BID) | OROMUCOSAL | Status: DC
  Administered 2013-05-04 – 2013-05-06 (×2): 15 mL via OROMUCOSAL

## 2013-05-04 NOTE — Progress Notes (Signed)
PT Cancellation Note  Patient Details Name: Austin [name] MRN: [id number] DOB: 19-Nov-1926   Cancelled Treatment:    Reason Eval/Treat Not Completed: Patient at procedure or test/unavailable. Pt at MRI, will re-attempt evaluation at next available time.    Elie Confer [place], [place] 05/04/2013, 11:00 AM

## 2013-05-04 NOTE — Progress Notes (Signed)
Patient is feeding self and have noticed that the patient appears to be coughing esp. after taking liquids.

## 2013-05-04 NOTE — Progress Notes (Signed)
  Echocardiogram 2D Echocardiogram has been performed.  Austin [name] 05/04/2013, 3:31 PM

## 2013-05-04 NOTE — Progress Notes (Signed)
INITIAL NUTRITION ASSESSMENT  Pt meets criteria for SEVERE MALNUTRITION in the context of social/environmental circumstances as evidenced by severe fat and muscle mass loss and weight loss > 7.5% in 3 months.  DOCUMENTATION CODES Per approved criteria  -Severe  malnutrition in the context of social or environmental circumstances   INTERVENTION:  1. Boost Breeze PO BID between meals with each providing 250 kcals and 9 grams of protein.  2. MVI daily  3. RD to continue to follow the nutrition care plan.  NUTRITION DIAGNOSIS: Unintentional weight loss related to food acceptance at nursing facility as evidenced by 48 lbs weight loss since October 2014.   Goal: Pt to meet >/= 90% of their estimated needs.  Monitor:  PO intake, weight trends, oral supplement acceptance  Reason for Assessment: Health hx  80 y.o. male  Admitting Dx: HCAP (healthcare-associated pneumonia)  ASSESSMENT: Pt has hx of atrial flutter, hypertension, CAD, and COPD. Pt recently had stayed at a SNF where towards the end of the stay had developed pneumonia. Per pt's son he reports pt has had a 40 lbs weight loss while staying at the SNF. Pt reports the weight loss occurred from not eating while staying at the SNF because he disliked the food offered there. Per pt family, the pt had been on appetite stimulant medication but it did not help with his appetite. Pt reports that he was hungry and has an appetite currently. Percent meal completion is unknown as he has not eaten yet due to a procedure. Pt reports disliking ensure complete and Boost breeze was ordered instead. Pt is on dysphagia 3 diet. Pt does not currently have teeth but is pending on new dentures.    Pt is c diff positive, has had at least 3 loose stools today. Influenza negative.  Nutrition Focused Physical Exam:  Subcutaneous Fat:  Orbital Region: Mild depletion Upper Arm Region: Severe depletion Thoracic and Lumbar Region: N/A  Muscle:  Temple  Region: Severe depletion Clavicle Bone Region: Mild depletion Clavicle and Acromion Bone Region: N/A Scapular Bone Region: N/A Dorsal Hand: Mild depletion Patellar Region: WNL Anterior Thigh Region: Mild depletion Posterior Calf Region: Severe depletion  Edema: none  Potassium is low at 3.6, but trending up. Pt is currently ordered for KCl. Sodium is currently low at 135.  Height: Ht Readings from Last 1 Encounters:  05/03/13 6\' 3"  (1.905 m)    Weight: Wt Readings from Last 1 Encounters:  05/03/13 239 lb 3.2 oz (108.5 kg)    Ideal Body Weight: 196 lbs  % Ideal Body Weight: 122%   Wt Readings from Last 10 Encounters:  05/03/13 239 lb 3.2 oz (108.5 kg)  01/20/13 287 lb 14.7 oz (130.6 kg)  01/20/13 287 lb 14.7 oz (130.6 kg)  12/13/12 280 lb (127.007 kg)  12/06/12 314 lb 8 oz (142.656 kg)  12/06/12 314 lb 8 oz (142.656 kg)  11/24/12 287 lb (130.182 kg)  11/15/12 289 lb (131.09 kg)  10/23/12 289 lb (131.09 kg)    Usual Body Weight: 287 lbs  % Usual Body Weight: 83%  BMI:  Body mass index is 29.9 kg/(m^2). Overweight  Estimated Nutritional Needs: Kcal: 2100-2300 kcals Protein: 100-110 grams Fluid: 2.1 L-2.3 L/day  Skin: abrasion- skin tear leg bilateral  Diet Order: Dysphagia 3 diet  EDUCATION NEEDS: -No education needs identified at this time   Intake/Output Summary (Last 24 hours) at 05/04/13 1509 Last data filed at 05/04/13 0830  Gross per 24 hour  Intake  0 ml  Output    526 ml  Net   -526 ml    Last BM: 1/16   Labs:   Recent Labs Lab 05/03/13 1319 05/04/13 0248  NA 134* 135*  K 3.4* 3.6*  CL 97 101  CO2 24 23  BUN 9 8  CREATININE 0.67 0.59  CALCIUM 8.7 8.4  GLUCOSE 108* 88    CBG (last 3)  No results found for this basename: GLUCAP,  in the last 72 hours  Scheduled Meds: . antiseptic oral rinse  15 mL Mouth Rinse q12n4p  . aspirin  300 mg Rectal Daily  . buPROPion  100 mg Oral Daily  . ceFEPime (MAXIPIME) IV  1 g  Intravenous Q12H  . chlorhexidine  15 mL Mouth Rinse BID  . digoxin  0.125 mg Oral Daily  . FLUoxetine  20 mg Oral Daily  . levothyroxine  75 mcg Oral QAC breakfast  . potassium chloride  40 mEq Oral Once  . Rivaroxaban  20 mg Oral QPC supper  . simvastatin  20 mg Oral q1800  . sodium chloride  3 mL Intravenous Q12H  . thiamine  100 mg Intravenous Daily  . vancomycin  1,250 mg Intravenous Q12H  . vancomycin  125 mg Oral QID    Continuous Infusions: . sodium chloride 100 mL/hr at 05/04/13 1000    Past Medical History  Diagnosis Date  . Hypertension   . Hyperlipidemia   . AAA (abdominal aortic aneurysm)   . Arthritis   . CAD (coronary artery disease)   . Thyroid disease     hypothyroidism  . Dysrhythmia     atrial flutter  . Shortness of breath     a little with exertion  . Polio 1934  . Atrial flutter     chronic anticoagulation  . COPD (chronic obstructive pulmonary disease)   . History of nuclear stress test 04/2010    dipyridamole; small, mostly fixed basal to mid inferior and inferoseptal defect (gut artifact v. scar); post stress EF 62%; non-diagnostic for ischemia; low risk     Past Surgical History  Procedure Laterality Date  . Abdominal aortic aneurysm repair  05/21/2010    aorto bi-iliac BPG  . Appendectomy  1949  . Popliteal synovial cyst excision Left   . Joint replacement      Knee and Hip replacements  . Total hip arthroplasty Left 1994  . Knee arthroplasty Left 2004  . Total hip revision Left 12/04/2012    Procedure: LEFT TOTAL HIP REVISION;  Surgeon: Mauri Pole, MD;  Location: [facility];  Service: Orthopedics;  Laterality: Left;  . Cardiac catheterization  2009    in [place]; 50-60% RCA  . Transthoracic echocardiogram  04/2010    EF =>55%; RV borderline dilated; LA mildly dilated; mild mitral annular calcif & mild MR; mild TR; mild calcif of AV leaflets with mild AV stenosis; aortic root sclerosis/calcif  . Orif femur fracture Left 01/22/2013     Procedure: OPEN REDUCTION INTERNAL FIXATION (ORIF) DISTAL FEMUR FRACTURE;  Surgeon: Mauri Pole, MD;  Location: [place];  Service: Orthopedics;  Laterality: Left;    Kallie Locks Dietetic Intern Pager: [phone]  I agree with the above information and made appropriate revisions. Inda Coke MS, RD, LDN Pager: [phone] After-hours pager: [phone]

## 2013-05-04 NOTE — Progress Notes (Signed)
Occupational Therapy Evaluation Patient Details Name: Austin [name] MRN: [id number] DOB: [birth year] Today's Date: 05/04/2013 Time: 7353-2992 OT Time Calculation (min): 27 min  OT Assessment / Plan / Recommendation History of present illness Pt s/p ORIF to L distal femur fx. 80 y.o. male who just had a L hip replacement 6 weeks ago (also has h/o L knee replacement 9 years ago), who unfortunately suffered a fall when his leg gave out on him (no LOC).  Unfortunately he landed on his L leg.  Leg was grossly deformed and movement very painful, (pain worse with movement, better with rest).   Clinical Impression   PTA, pt had just been D/C from [facility]. Family completing ADL and using a hoyer lift. Pt has all nec DME for home D/C. Will benefit from skilled OT services to facilitate D/C home with 24/7 assistance of family and prevent further loss in mobility or functional status. Will contact Dr. Alvan Dame regarding WBS.    OT Assessment  Patient needs continued OT Services    Follow Up Recommendations  Home health OT;Supervision/Assistance - 24 hour (would benefit from rehab at SNF, but famiy plans to take pt )    Barriers to Discharge      Equipment Recommendations  None recommended by OT    Recommendations for Other Services    Frequency  Min 2X/week    Precautions / Restrictions Precautions Precautions: Fall Restrictions LLE Weight Bearing: Non weight bearing   Pertinent Vitals/Pain Vitals stable. O2 91 with activity RA    ADL  Eating/Feeding: Other (comment) (dysphagia 3 thin liquids) Grooming: Set up Where Assessed - Grooming: Supported sitting Upper Body Bathing: Minimal assistance Where Assessed - Upper Body Bathing: Supported sitting Lower Body Bathing: Maximal assistance Where Assessed - Lower Body Bathing: Rolling right and/or left Upper Body Dressing: Moderate assistance Where Assessed - Upper Body Dressing: Supported sitting Lower Body Dressing: +1 Total  assistance Where Assessed - Lower Body Dressing: Rolling right and/or left Toileting - Clothing Manipulation and Hygiene: +1 Total assistance Transfers/Ambulation Related to ADLs: Using hoyer lift at home ADL Comments: family assisting with all ADL    OT Diagnosis: Generalized weakness  OT Problem List: Decreased strength;Decreased range of motion;Decreased activity tolerance;Impaired balance (sitting and/or standing);Decreased knowledge of use of DME or AE;Pain OT Treatment Interventions: Self-care/ADL training;Therapeutic exercise;Energy conservation;DME and/or AE instruction;Therapeutic activities;Patient/family education;Balance training   OT Goals(Current goals can be found in the care plan section) Acute Rehab OT Goals Patient Stated Goal: go home OT Goal Formulation: With patient Time For Goal Achievement: 05/18/13 Potential to Achieve Goals: Good  Visit Information  Last OT Received On: 05/04/13 Assistance Needed: +2 History of Present Illness: Pt s/p ORIF to L distal femur fx. 80 y.o. male who just had a L hip replacement 6 weeks ago (also has h/o L knee replacement 9 years ago), who unfortunately suffered a fall when his leg gave out on him (no LOC).  Unfortunately he landed on his L leg.  Leg was grossly deformed and movement very painful, (pain worse with movement, better with rest).       Prior [place] expects to be discharged to:: Private residence Living Arrangements: Children Available Help at Discharge: Available 24 hours/day Type of Home: House Home Access: Stairs to enter [employer] of Steps: 2 Home Layout: Two level;Able to live on main level with bedroom/bathroom Home Equipment: Gilford Rile - 2 wheels;Cane - single point;Bedside commode;Wheelchair - manual;Hospital bed (hoyer lift) Prior Function  Level of Independence: Needs assistance Gait / Transfers Assistance Needed: used RW Comments: Pt states that before  breaking his hip he was mod I with ADL and moblity         Vision/Perception     Cognition  Cognition Arousal/Alertness: Awake/alert Behavior During Therapy: WFL for tasks assessed/performed Overall Cognitive Status: No family/caregiver present to determine baseline cognitive functioning    Extremity/Trunk Assessment Upper Extremity Assessment Upper Extremity Assessment: Generalized weakness;LUE deficits/detail (only able to complete 90 FF. ? premorbid) Lower Extremity Assessment Lower Extremity Assessment: Defer to PT evaluation (only able to achieve 90 knee flex LLE. rigid movements) Cervical / Trunk Assessment Cervical / Trunk Assessment: Kyphotic     Mobility Bed Mobility Overal bed mobility: Needs Assistance Bed Mobility: Supine to Sit Supine to sit: Max assist;HOB elevated General bed mobility comments: stiff/rigid movements Transfers Overall transfer level: Needs assistance General transfer comment: still NWB LLE?     Exercise     Balance General Comments General comments (skin integrity, edema, etc.): exoriated buttocks   End of Session OT - End of Session Activity Tolerance: Patient tolerated treatment well Patient left: in bed;with call bell/phone within reach Nurse Communication: Mobility status;Need for lift equipment (will need maximove)  GO     Shariff Lasky,HILLARY 05/04/2013, 4:42 PM [facility], OTR/L  [phone] 05/04/2013

## 2013-05-04 NOTE — Consult Note (Signed)
Referring Physician: [place] Pink    Chief Complaint: Stroke  HPI:                                                                                                                                         Austin [name] is an 80 y.o. male with history of HTN, Hyperlipidemia,  Aflutter on Xarelto who was recently hospitalized for a femur fracture and sent to a SNF.  While in the SNF he developed PNA was treated with vancomycin and Zosyn. The course of antibiotics finished on January 13. HE went home to live with his daughter last Friday .  Over last few days he was noted to be SOB and warm to touch.  On morning of admission he was noted to be confused and tachycardic.  HE was brought to the hospital. CXR showed infiltrates. MRI was obtained due to confusion.  MRI findings are consistent with three punctate acute infarcts in bilateral parietal and left frontal lobes. No pontine infarct as suspected by CT. No evidence of intracranial stenosis noted on MRA. Patient denies missing any home doses of Xarelto and he has been continued on Xarelto while in hospital. Currently he is alert, oriented to date, year and place , able to follow all commands with no weakness noted.    Blood cultures pending C. Diff positive  Echo pending Carotids pending A1c and FLP pending  Date last known well: Unable to determine Time last known well: Unable to determine tPA Given: No: on Xarelto  Past Medical History  Diagnosis Date  . Hypertension   . Hyperlipidemia   . AAA (abdominal aortic aneurysm)   . Arthritis   . CAD (coronary artery disease)   . Thyroid disease     hypothyroidism  . Dysrhythmia     atrial flutter  . Shortness of breath     a little with exertion  . Polio 1934  . Atrial flutter     chronic anticoagulation  . COPD (chronic obstructive pulmonary disease)   . History of nuclear stress test 04/2010    dipyridamole; small, mostly fixed basal to mid inferior and inferoseptal defect (gut  artifact v. scar); post stress EF 62%; non-diagnostic for ischemia; low risk     Past Surgical History  Procedure Laterality Date  . Abdominal aortic aneurysm repair  05/21/2010    aorto bi-iliac [facility]  . Appendectomy  1949  . Popliteal synovial cyst excision Left   . Joint replacement      Knee and Hip replacements  . Total hip arthroplasty Left 1994  . Knee arthroplasty Left 2004  . Total hip revision Left 12/04/2012    Procedure: LEFT TOTAL HIP REVISION;  Surgeon: Mauri Pole, MD;  Location: [facility];  Service: Orthopedics;  Laterality: Left;  . Cardiac catheterization  2009    in [place]; 50-60% RCA  . Transthoracic echocardiogram  04/2010  EF =>55%; RV borderline dilated; LA mildly dilated; mild mitral annular calcif & mild MR; mild TR; mild calcif of AV leaflets with mild AV stenosis; aortic root sclerosis/calcif  . Orif femur fracture Left 01/22/2013    Procedure: OPEN REDUCTION INTERNAL FIXATION (ORIF) DISTAL FEMUR FRACTURE;  Surgeon: Mauri Pole, MD;  Location: [facility];  Service: Orthopedics;  Laterality: Left;    Family History  Problem Relation Age of Onset  . Other Father     varicose veins  . Hyperlipidemia Son   . Hypertension Son   . AAA (abdominal aortic aneurysm) Son   . Lung cancer Brother    Social History:  reports that he quit smoking about 47 years ago. His smoking use included Cigarettes. He has a 25 pack-year smoking history. He has never used smokeless tobacco. He reports that he drinks alcohol. He reports that he does not use illicit drugs.  Allergies: No Known Allergies  Medications:                                                                                                                           Prior to Admission:  Prescriptions prior to admission  Medication Sig Dispense Refill  . albuterol (PROVENTIL) (2.5 MG/3ML) 0.083% nebulizer solution Take 2.5 mg by nebulization every 6 (six) hours as needed for wheezing or shortness of breath.      [name]  buPROPion (WELLBUTRIN SR) 100 MG 12 hr tablet Take 100 mg by mouth daily.      . cholecalciferol (VITAMIN D) 1000 UNITS tablet Take 1,000 Units by mouth daily.      [name] dextromethorphan 15 MG/5ML syrup Take 10 mLs by mouth 3 (three) times daily as needed for cough.      . digoxin (LANOXIN) 0.125 MG tablet Take 1 tablet (0.125 mg total) by mouth daily.      . feeding supplement, ENSURE COMPLETE, (ENSURE COMPLETE) LIQD Take 237 mLs by mouth 2 (two) times daily between meals.      . ferrous sulfate 325 (65 FE) MG tablet Take 325 mg by mouth daily with breakfast.      . FLUoxetine (PROZAC) 20 MG capsule Take 20 mg by mouth daily.      [name] levothyroxine (SYNTHROID, LEVOTHROID) 75 MCG tablet Take 75 mcg by mouth daily before breakfast.      . lovastatin (MEVACOR) 40 MG tablet Take 40 mg by mouth at bedtime.      . methocarbamol (ROBAXIN) 500 MG tablet Take 1 tablet (500 mg total) by mouth every 6 (six) hours as needed (muscle spasms).  30 tablet  0  . Multiple Vitamin (MULTIVITAMIN WITH MINERALS) TABS tablet Take 1 tablet by mouth daily.      . pantoprazole (PROTONIX) 40 MG tablet Take 1 tablet (40 mg total) by mouth daily.      . potassium chloride SA (K-DUR,KLOR-CON) 20 MEQ tablet Take 20 mEq by mouth daily.      . Rivaroxaban (XARELTO)  20 MG TABS Take 20 mg by mouth daily.      [name] trolamine salicylate (ASPERCREME) 10 % cream Apply 1 application topically as needed (for muscle pain).      . Piperacillin Sod-Tazobactam So (ZOSYN IV) Inject 3.375 g into the vein every 8 (eight) hours.      . vancomycin (VANCOCIN) 1000 MG injection Inject 1,000 mg into the vein daily.       Scheduled: . antiseptic oral rinse  15 mL Mouth Rinse q12n4p  . aspirin  300 mg Rectal Daily  . buPROPion  100 mg Oral Daily  . ceFEPime (MAXIPIME) IV  1 g Intravenous Q12H  . chlorhexidine  15 mL Mouth Rinse BID  . digoxin  0.125 mg Oral Daily  . FLUoxetine  20 mg Oral Daily  . levothyroxine  75 mcg Oral QAC breakfast  . potassium  chloride  40 mEq Oral Once  . Rivaroxaban  20 mg Oral QPC supper  . simvastatin  20 mg Oral q1800  . sodium chloride  3 mL Intravenous Q12H  . thiamine  100 mg Intravenous Daily  . vancomycin  1,250 mg Intravenous Q12H  . vancomycin  125 mg Oral QID    ROS:                                                                                                                                       History obtained from the patient  General ROS: negative for - chills, fatigue, fever, night sweats, weight gain or weight loss Psychological ROS: negative for - behavioral disorder, hallucinations, memory difficulties, mood swings or suicidal ideation Ophthalmic ROS: negative for - blurry vision, double vision, eye pain or loss of vision ENT ROS: negative for - epistaxis, nasal discharge, oral lesions, sore throat, tinnitus or vertigo Allergy and Immunology ROS: negative for - hives or itchy/watery eyes Hematological and Lymphatic ROS: negative for - bleeding problems, bruising or swollen lymph nodes Endocrine ROS: negative for - galactorrhea, hair pattern changes, polydipsia/polyuria or temperature intolerance Respiratory ROS: positive for - cough, hemoptysis, shortness of breath  Cardiovascular ROS: negative for - chest pain, dyspnea on exertion, edema or irregular heartbeat Gastrointestinal ROS: negative for - abdominal pain, diarrhea, hematemesis, nausea/vomiting or stool incontinence Genito-Urinary ROS: negative for - dysuria, hematuria, incontinence or urinary frequency/urgency Musculoskeletal ROS: negative for - joint swelling or muscular weakness Neurological ROS: as noted in HPI Dermatological ROS: negative for rash and skin lesion changes  Neurologic Examination:  Blood pressure 110/61, pulse 83, temperature 98.5 F (36.9 C), temperature source Axillary, resp. rate 20, height 6\' 3"  (1.905 m),  weight 108.5 kg (239 lb 3.2 oz), SpO2 96.00%.  Mental Status: Alert, oriented, thought content appropriate.  Speech hypophonic but fluent without evidence of aphasia.  Able to follow 3 step commands without difficulty. Cranial Nerves: II: Discs flat bilaterally; Visual fields grossly normal, pupils equal, round, reactive to light and accommodation III,IV, VI: ptosis not present, extra-ocular motions intact bilaterally V,VII: smile symmetric, facial light touch sensation normal bilaterally VIII: hearing normal bilaterally IX,X: gag reflex present XI: bilateral shoulder shrug XII: midline tongue extension   Motor: Right : Upper extremity   5/5    Left:     Upper extremity   5/5  Lower extremity   5/5     Lower extremity   5/5 Tone and bulk:normal tone throughout; no atrophy noted Sensory: Pinprick and light touch intact throughout, bilaterally Deep Tendon Reflexes:  Right: Upper Extremity   Left: Upper extremity   biceps (C-5 to C-6) 2/4   biceps (C-5 to C-6) 2/4 tricep (C7) 2/4    triceps (C7) 2/4 Brachioradialis (C6) 2/4  Brachioradialis (C6) 2/4  Lower Extremity Lower Extremity  quadriceps (L-2 to L-4) 2/4   quadriceps (L-2 to L-4) 1/4 (S/P knee surgery) Achilles (S1) 1/4   Achilles (S1) 1/4  Plantars: Right: downgoing   Left: downgoing Cerebellar: normal finger-to-nose,  normal heel-to-shin test Gait: not tested due to multiple leads.  CV: pulses palpable throughout    Lab Results: Basic Metabolic Panel:  Recent Labs Lab 05/03/13 1319 05/04/13 0248  NA 134* 135*  K 3.4* 3.6*  CL 97 101  CO2 24 23  GLUCOSE 108* 88  BUN 9 8  CREATININE 0.67 0.59  CALCIUM 8.7 8.4    Liver Function Tests:  Recent Labs Lab 05/03/13 1319 05/04/13 0248  AST 20 15  ALT 17 12  ALKPHOS 94 130*  BILITOT 0.7 0.6  PROT 6.5 6.0  ALBUMIN 2.3* 2.1*    Recent Labs Lab 05/03/13 1319  LIPASE 12   No results found for this basename: AMMONIA,  in the last 168  hours  CBC:  Recent Labs Lab 05/03/13 1319 05/04/13 0248  WBC 18.7* 17.7*  NEUTROABS 15.7*  --   HGB 12.3* 11.3*  HCT 36.7* 33.7*  MCV 87.8 88.2  PLT 211 192    Cardiac Enzymes:  Recent Labs Lab 05/03/13 1319  TROPONINI <0.30    Lipid Panel: No results found for this basename: CHOL, TRIG, HDL, CHOLHDL, VLDL, LDLCALC,  in the last 168 hours  CBG: No results found for this basename: GLUCAP,  in the last 168 hours  Microbiology: Results for orders placed during the hospital encounter of 05/03/13  CULTURE, BLOOD (ROUTINE X 2)     Status: None   Collection Time    05/03/13  3:05 PM      Result Value Range Status   Specimen Description BLOOD ARM RIGHT   Final   Special Requests BOTTLES DRAWN AEROBIC ONLY 5CC   Final   Culture  Setup Time     Final   Value: 05/03/2013 20:19     Performed at [facility]   Culture     Final   Value:        BLOOD CULTURE RECEIVED NO GROWTH TO DATE CULTURE WILL BE HELD FOR 5 DAYS BEFORE ISSUING A FINAL NEGATIVE REPORT     Performed at [facility]  Report Status PENDING   Incomplete  CULTURE, BLOOD (ROUTINE X 2)     Status: None   Collection Time    05/03/13  4:00 PM      Result Value Range Status   Specimen Description BLOOD LEFT FOREARM   Final   Special Requests BOTTLES DRAWN AEROBIC AND ANAEROBIC 5CC   Final   Culture  Setup Time     Final   Value: 05/03/2013 21:11     Performed at [facility]   Culture     Final   Value:        BLOOD CULTURE RECEIVED NO GROWTH TO DATE CULTURE WILL BE HELD FOR 5 DAYS BEFORE ISSUING A FINAL NEGATIVE REPORT     Performed at [facility]   Report Status PENDING   Incomplete  CLOSTRIDIUM DIFFICILE BY PCR     Status: Abnormal   Collection Time    05/03/13  8:20 PM      Result Value Range Status   C difficile by pcr POSITIVE (*) NEGATIVE Final   Comment: CRITICAL RESULT CALLED TO, READ BACK BY AND VERIFIED WITH:     Tracie Harrier RN 12:40 05/04/13 ([email or web address])  MRSA  PCR SCREENING     Status: None   Collection Time    05/04/13  7:08 AM      Result Value Range Status   MRSA by PCR NEGATIVE  NEGATIVE Final   Comment:            The GeneXpert MRSA Assay (FDA     approved for NASAL specimens     only), is one component of a     comprehensive MRSA colonization     surveillance program. It is not     intended to diagnose MRSA     infection nor to guide or     monitor treatment for     MRSA infections.    Coagulation Studies:  Recent Labs  05/03/13 1600  LABPROT 19.4*  INR 1.69*    Imaging: Ct Head Wo Contrast  05/03/2013   CLINICAL DATA:  Fever and confusion  EXAM: CT HEAD WITHOUT CONTRAST  TECHNIQUE: Contiguous axial images were obtained from the base of the skull through the vertex without intravenous contrast. Study was obtained within 24 hr of patient's arrival at the emergency department.  COMPARISON:  None.  FINDINGS: There is mild diffuse atrophy. There is a small focus of increased attenuation in the posterior left parafalcine region 3 measuring 1.4 x 1.1 cm, felt 1 to represent a partially calcified meningioma. There is no surrounding edema in this area. There is no other evidence of mass. There is no appreciable acute hemorrhage. There is no extra-axial fluid or midline shift.  There is patchy small vessel disease in the centra semiovale bilaterally. There is decreased attenuation throughout the left pons compared to the right, concerning for recent infarct. No other findings felt to represent potential acute infarct identified.  The bony calvarium appears intact. The mastoid air cells are clear. There is mucosal thickening in the left maxillary antrum.  IMPRESSION: Suspect recent infarct in the left pons.  Atrophy with periventricular small vessel disease.  Small partially calcified meningioma posterior left parafalcine region. No associated edema or mass effect.  Left maxillary sinus disease.   Electronically Signed   By: Lowella Grip M.D.    On: 05/03/2013 13:54   Mr Jodene Nam Head Wo Contrast  05/04/2013   CLINICAL DATA:  Fever and confusion. Possible pontine  infarct on head CT.  EXAM: MRI HEAD WITHOUT CONTRAST  MRA HEAD WITHOUT CONTRAST  TECHNIQUE: Multiplanar, multiecho pulse sequences of the brain and surrounding structures were obtained without intravenous contrast. Angiographic images of the head were obtained using MRA technique without contrast.  COMPARISON:  Head CT 05/03/2013  FINDINGS: MRI HEAD FINDINGS  There is a punctate focus acute infarct involving medial right parietal cortex (series 5, image 28). An additional punctate focus of acute infarct is noted involving the left parietal cortex (series 5, image 29). There is also likely a third punctate focus of acute infarct involving left frontal cortex (series 5, image 30), although this cannot be confirmed on the ADC map and could be artifactual. Multiple foci of T2 hyperintensity within the subcortical and deep cerebral white matter are nonspecific but compatible with mild chronic small vessel ischemic disease. There is moderate generalized cerebral atrophy. There is no signal abnormality in the pons to correspond to the region of apparent low density on the CT, which was likely artifactual. 1.3 x 0.9 cm left parafalcine mass is seen in the parietal region corresponding to partially calcified lesion described on head CT. There is no intracranial hemorrhage, midline shift, or extra-axial fluid collection. Major intracranial vascular flow voids are unremarkable. Prior left cataract surgery is noted. A small amount of left maxillary sinus fluid is present. There is a small left mastoid effusion.  MRA HEAD FINDINGS  The visualized distal vertebral arteries are patent. Left vertebral artery is slightly dominant, with the right vertebral artery being small distal to the PICA origin. PICA origins are patent bilaterally. AICA and SCA origins are also patent bilaterally. Basilar artery is patent  without stenosis. The PCA origins and visualized branches are patent and unremarkable. A right posterior communicating artery is identified.  Internal carotid arteries are patent from skullbase to carotid terminus. ACA and MCA origins of the branch vessels are patent. Incidental note is made of a median artery of the corpus callosum. No intracranial aneurysm is identified.  IMPRESSION: 1. Three punctate foci of acute infarct involving the bilateral parietal lobes and left frontal lobe. 2. No evidence of pontine infarct. 3. Small left parafalcine extra-axial mass, likely a meningioma. 4. No evidence of major intracranial arterial occlusion or flow limiting stenosis.   Electronically Signed   By: Logan Bores   On: 05/04/2013 13:53   Mr Brain Wo Contrast  05/04/2013   CLINICAL DATA:  Fever and confusion. Possible pontine infarct on head CT.  EXAM: MRI HEAD WITHOUT CONTRAST  MRA HEAD WITHOUT CONTRAST  TECHNIQUE: Multiplanar, multiecho pulse sequences of the brain and surrounding structures were obtained without intravenous contrast. Angiographic images of the head were obtained using MRA technique without contrast.  COMPARISON:  Head CT 05/03/2013  FINDINGS: MRI HEAD FINDINGS  There is a punctate focus acute infarct involving medial right parietal cortex (series 5, image 28). An additional punctate focus of acute infarct is noted involving the left parietal cortex (series 5, image 29). There is also likely a third punctate focus of acute infarct involving left frontal cortex (series 5, image 30), although this cannot be confirmed on the ADC map and could be artifactual. Multiple foci of T2 hyperintensity within the subcortical and deep cerebral white matter are nonspecific but compatible with mild chronic small vessel ischemic disease. There is moderate generalized cerebral atrophy. There is no signal abnormality in the pons to correspond to the region of apparent low density on the CT, which was likely artifactual.  1.3  x 0.9 cm left parafalcine mass is seen in the parietal region corresponding to partially calcified lesion described on head CT. There is no intracranial hemorrhage, midline shift, or extra-axial fluid collection. Major intracranial vascular flow voids are unremarkable. Prior left cataract surgery is noted. A small amount of left maxillary sinus fluid is present. There is a small left mastoid effusion.  MRA HEAD FINDINGS  The visualized distal vertebral arteries are patent. Left vertebral artery is slightly dominant, with the right vertebral artery being small distal to the PICA origin. PICA origins are patent bilaterally. AICA and SCA origins are also patent bilaterally. Basilar artery is patent without stenosis. The PCA origins and visualized branches are patent and unremarkable. A right posterior communicating artery is identified.  Internal carotid arteries are patent from skullbase to carotid terminus. ACA and MCA origins of the branch vessels are patent. Incidental note is made of a median artery of the corpus callosum. No intracranial aneurysm is identified.  IMPRESSION: 1. Three punctate foci of acute infarct involving the bilateral parietal lobes and left frontal lobe. 2. No evidence of pontine infarct. 3. Small left parafalcine extra-axial mass, likely a meningioma. 4. No evidence of major intracranial arterial occlusion or flow limiting stenosis.   Electronically Signed   By: Logan Bores   On: 05/04/2013 13:53   Dg Chest Port 1 View  05/03/2013   CLINICAL DATA:  Fever.  EXAM: PORTABLE CHEST - 1 VIEW  COMPARISON:  January 26, 2013.  FINDINGS: Stable cardiomediastinal silhouette. New linear density seen in left perihilar region concerning for subsegmental listhesis or possibly early pneumonia. No pleural effusion or pneumothorax is noted. Stable bilateral interstitial opacities are noted most consistent with scarring.  IMPRESSION: New linear opacity seen in left midlung concerning for subsegmental  atelectasis or possibly pneumonia. Followup radiographs are recommended.   Electronically Signed   By: Sabino Dick M.D.   On: 05/03/2013 13:44    Etta Quill PA-C [facility] [phone]  05/04/2013, 3:06 PM   Patient seen and examined.  Clinical course and management discussed.  Necessary edits performed.  I agree with the above.  Assessment and plan of care developed and discussed below.   Assessment: 80 y.o. male with PNA and confusion.  Due to confusion MRI of the brain was performed.  MRI has been reviewed and shows punctate acute infarcts int he parietal lobes bilaterally and the left frontal lobe.  Patient's confusion seems to have cleared.  Neurological examination nonfocal.  On Xarelto.    Stroke Risk Factors - hyperlipidemia, hypertension and Aflutter  Recommendations: 1. HgbA1c, fasting lipid panel 2. MRI, MRA  of the brain without contrast 3. PT consult, OT consult, Speech consult 4. Echocardiogram 5. Carotid dopplers 6. Prophylactic therapy-Continue Xarelto 7. Risk factor modification 8. Telemetry monitoring 9. Frequent neuro checks   Alexis Goodell, MD [facility] [phone]  05/04/2013  4:06 PM

## 2013-05-04 NOTE — Progress Notes (Signed)
Down to MRI via bed.

## 2013-05-04 NOTE — Progress Notes (Addendum)
[facility] PROGRESS NOTE  JONERIK SLIKER ONG:[id number] DOB: 01/30/1927 DOA: 05/03/2013  PCP: [name], MD  Brief HPI: Austin [name] is a 80 y.o. male with a past medical history of atrial flutter, hypertension, hospitalization in October for femur fracture. Patient was discharged from the hospital at that time to a skilled nursing facility for rehabilitation. Towards the end of stay in the skilled nursing facility patient developed a pneumonia. It looks like he was treated with vancomycin and Zosyn. The course of antibiotics finished on January 13. He came home to live with his daughter on 1/9. His cough had improved. However, in the last few days prior to admission the family noted that the patient has been coughing more. He was also confused. So he was brought in to the hospital. He was found to CXR suspicious for infiltrate. There was also a history of diarrhea at home.  Past medical history:  Past Medical History  Diagnosis Date  . Hypertension   . Hyperlipidemia   . AAA (abdominal aortic aneurysm)   . Arthritis   . CAD (coronary artery disease)   . Thyroid disease     hypothyroidism  . Dysrhythmia     atrial flutter  . Shortness of breath     a little with exertion  . Polio 1934  . Atrial flutter     chronic anticoagulation  . COPD (chronic obstructive pulmonary disease)   . History of nuclear stress test 04/2010    dipyridamole; small, mostly fixed basal to mid inferior and inferoseptal defect (gut artifact v. scar); post stress EF 62%; non-diagnostic for ischemia; low risk     Consultants: None  Procedures: None  Antibiotics: Vanc 1/15--> Cefepime 1/15--> Tamiflu 1/15-->1/16 Oral Vanc 1/16-->  Subjective: Patient feels better today. Less short of breath. Has had 3 loose stools today. Denies any pain in abdomen. No nausea or vomiting.  Objective: Vital Signs  Filed Vitals:   05/03/13 2300 05/04/13 0830 05/04/13 0832 05/04/13 0851  BP:  120/68  110/61 110/61  Pulse: 100  83   Temp: 97.4 F (36.3 C) 98.5 F (36.9 C)    TempSrc: Oral Axillary    Resp: 25  20   Height:      Weight:      SpO2: 99%  96%     Intake/Output Summary (Last 24 hours) at 05/04/13 1351 Last data filed at 05/04/13 0830  Gross per 24 hour  Intake      0 ml  Output    526 ml  Net   -526 ml   Filed Weights   05/03/13 1500 05/03/13 2020  Weight: 130 kg (286 lb 9.6 oz) 108.5 kg (239 lb 3.2 oz)    General appearance: alert, cooperative, fatigued and no distress Head: Normocephalic, without obvious abnormality, atraumatic Throat: lips, mucosa, and tongue normal; teeth and gums normal Resp: coarse breath sounds at bases with few crackls. No wheezing. Cardio: regular rate and rhythm, S1, S2 normal, no murmur, click, rub or gallop GI: soft, non-tender; bowel sounds normal; no masses,  no organomegaly Extremities: extremities normal, atraumatic, no cyanosis or edema Pulses: 2+ and symmetric Neurologic: Alert. No focal deficits.  Lab Results:  Basic Metabolic Panel:  Recent Labs Lab 05/03/13 1319 05/04/13 0248  NA 134* 135*  K 3.4* 3.6*  CL 97 101  CO2 24 23  GLUCOSE 108* 88  BUN 9 8  CREATININE 0.67 0.59  CALCIUM 8.7 8.4   Liver Function Tests:  Recent Labs Lab  05/03/13 1319 05/04/13 0248  AST 20 15  ALT 17 12  ALKPHOS 94 130*  BILITOT 0.7 0.6  PROT 6.5 6.0  ALBUMIN 2.3* 2.1*    Recent Labs Lab 05/03/13 1319  LIPASE 12   CBC:  Recent Labs Lab 05/03/13 1319 05/04/13 0248  WBC 18.7* 17.7*  NEUTROABS 15.7*  --   HGB 12.3* 11.3*  HCT 36.7* 33.7*  MCV 87.8 88.2  PLT 211 192   Cardiac Enzymes:  Recent Labs Lab 05/03/13 1319  TROPONINI <0.30   BNP (last 3 results)  Recent Labs  05/03/13 1319  PROBNP 622.4*     Recent Results (from the past 240 hour(s))  CULTURE, BLOOD (ROUTINE X 2)     Status: None   Collection Time    05/03/13  3:05 PM      Result Value Range Status   Specimen Description  BLOOD ARM RIGHT   Final   Special Requests BOTTLES DRAWN AEROBIC ONLY 5CC   Final   Culture  Setup Time     Final   Value: 05/03/2013 20:19     Performed at [facility]   Culture     Final   Value:        BLOOD CULTURE RECEIVED NO GROWTH TO DATE CULTURE WILL BE HELD FOR 5 DAYS BEFORE ISSUING A FINAL NEGATIVE REPORT     Performed at [facility]   Report Status PENDING   Incomplete  CULTURE, BLOOD (ROUTINE X 2)     Status: None   Collection Time    05/03/13  4:00 PM      Result Value Range Status   Specimen Description BLOOD LEFT FOREARM   Final   Special Requests BOTTLES DRAWN AEROBIC AND ANAEROBIC 5CC   Final   Culture  Setup Time     Final   Value: 05/03/2013 21:11     Performed at [facility]   Culture     Final   Value:        BLOOD CULTURE RECEIVED NO GROWTH TO DATE CULTURE WILL BE HELD FOR 5 DAYS BEFORE ISSUING A FINAL NEGATIVE REPORT     Performed at [facility]   Report Status PENDING   Incomplete  CLOSTRIDIUM DIFFICILE BY PCR     Status: Abnormal   Collection Time    05/03/13  8:20 PM      Result Value Range Status   C difficile by pcr POSITIVE (*) NEGATIVE Final   Comment: CRITICAL RESULT CALLED TO, READ BACK BY AND VERIFIED WITH:     Tracie Harrier RN 12:40 05/04/13 ([email or web address])  MRSA PCR SCREENING     Status: None   Collection Time    05/04/13  7:08 AM      Result Value Range Status   MRSA by PCR NEGATIVE  NEGATIVE Final   Comment:            The GeneXpert MRSA Assay (FDA     approved for NASAL specimens     only), is one component of a     comprehensive MRSA colonization     surveillance program. It is not     intended to diagnose MRSA     infection nor to guide or     monitor treatment for     MRSA infections.      Studies/Results: Ct Head Wo Contrast  05/03/2013   CLINICAL DATA:  Fever and confusion  EXAM: CT HEAD WITHOUT CONTRAST  TECHNIQUE: Contiguous  axial images were obtained from the base of the skull through the vertex  without intravenous contrast. Study was obtained within 24 hr of patient's arrival at the emergency department.  COMPARISON:  None.  FINDINGS: There is mild diffuse atrophy. There is a small focus of increased attenuation in the posterior left parafalcine region 3 measuring 1.4 x 1.1 cm, felt 1 to represent a partially calcified meningioma. There is no surrounding edema in this area. There is no other evidence of mass. There is no appreciable acute hemorrhage. There is no extra-axial fluid or midline shift.  There is patchy small vessel disease in the centra semiovale bilaterally. There is decreased attenuation throughout the left pons compared to the right, concerning for recent infarct. No other findings felt to represent potential acute infarct identified.  The bony calvarium appears intact. The mastoid air cells are clear. There is mucosal thickening in the left maxillary antrum.  IMPRESSION: Suspect recent infarct in the left pons.  Atrophy with periventricular small vessel disease.  Small partially calcified meningioma posterior left parafalcine region. No associated edema or mass effect.  Left maxillary sinus disease.   Electronically Signed   By: Lowella Grip M.D.   On: 05/03/2013 13:54   Dg Chest Port 1 View  05/03/2013   CLINICAL DATA:  Fever.  EXAM: PORTABLE CHEST - 1 VIEW  COMPARISON:  January 26, 2013.  FINDINGS: Stable cardiomediastinal silhouette. New linear density seen in left perihilar region concerning for subsegmental listhesis or possibly early pneumonia. No pleural effusion or pneumothorax is noted. Stable bilateral interstitial opacities are noted most consistent with scarring.  IMPRESSION: New linear opacity seen in left midlung concerning for subsegmental atelectasis or possibly pneumonia. Followup radiographs are recommended.   Electronically Signed   By: Sabino Dick M.D.   On: 05/03/2013 13:44    Medications:  Scheduled: . aspirin  300 mg Rectal Daily  . buPROPion  100 mg  Oral Daily  . ceFEPime (MAXIPIME) IV  1 g Intravenous Q12H  . digoxin  0.125 mg Oral Daily  . FLUoxetine  20 mg Oral Daily  . levothyroxine  75 mcg Oral QAC breakfast  . pantoprazole  40 mg Oral Daily  . Rivaroxaban  20 mg Oral QPC supper  . simvastatin  20 mg Oral q1800  . sodium chloride  3 mL Intravenous Q12H  . thiamine  100 mg Intravenous Daily  . vancomycin  1,250 mg Intravenous Q12H   Continuous: . sodium chloride 100 mL/hr at 05/04/13 1000   JSH:FWYOVZCHYIFOY, acetaminophen, levalbuterol, methocarbamol, ondansetron (ZOFRAN) IV, ondansetron, oxyCODONE  Assessment/Plan:  Principal Problem:   HCAP (healthcare-associated pneumonia) Active Problems:   Chronic atrial flutter   Coronary artery disease   Essential hypertension   Acute encephalopathy   CVA (cerebrovascular accident)    Healthcare associated pneumonia Continue broad spectrum coverage for now. However de-escalate quickly based on cultures considering positive c diff. Influenza was negative. Can stop Tamiflu.   C diff Diarrhea Abdomen is soft. Will initiate c diff treatment protocol with oral vanc. Contact precautions.  Abnormal CT head with possible recent stroke He does not have any focal deficits at this time. MRI is pending. Seen my SLP and on Dys 3 diet. Echocardiogram and carotid Dopplers will be obtained. This likely happened at least a few weeks ago and so not a tpa candidate.   Tachycardia/Likely A Flutter HR is better. Will repeat EKG. Continue Digoxin. Monitor on tele.  Acute encephalopathy No focal deficits. No meningeal signs. Most likely  due to infection/dehydration. Await MRI brain.   On chronic anticoagulation with Xarelto for Atrial Flutter This will be continued.   History of hypothyroidism Continue with l-thyroxine.   Left femur fracture s/p repair in October He continues to be nonweightbearing on the left lower extremity. He has a followup with his orthopedic surgeon (Dr. Alvan Dame)  sometime this month.   DVT Prophylaxis: He is on full anticoagulation  Code Status: Full code  Family Communication: Tried calling daughter with no response. Will try again later.  Disposition Plan: [facility] for transfer to floor.    LOS: 1 day   [facility] Pager [phone] 05/04/2013, 1:51 PM  If 8PM-8AM, please contact night-coverage at [email or web address], password [email or web address]   MRI report reviewed. Small acute infarcts noted bilaterally. Patient without focal deficits. Further work up is pending. Will consult neurology.  Spence Soberano 2:10 PM

## 2013-05-04 NOTE — Progress Notes (Signed)
Utilization review completed.  

## 2013-05-04 NOTE — Evaluation (Signed)
Clinical/Bedside Swallow Evaluation Patient Details  Name: Austin [name] MRN: [id number] Date of Birth: 1926/12/05  Today's Date: 05/04/2013 Time: 1017-1030 SLP Time Calculation (min): 13 min  Past Medical History:  Past Medical History  Diagnosis Date  . Hypertension   . Hyperlipidemia   . AAA (abdominal aortic aneurysm)   . Arthritis   . CAD (coronary artery disease)   . Thyroid disease     hypothyroidism  . Dysrhythmia     atrial flutter  . Shortness of breath     a little with exertion  . Polio 1934  . Atrial flutter     chronic anticoagulation  . COPD (chronic obstructive pulmonary disease)   . History of nuclear stress test 04/2010    dipyridamole; small, mostly fixed basal to mid inferior and inferoseptal defect (gut artifact v. scar); post stress EF 62%; non-diagnostic for ischemia; low risk    Past Surgical History:  Past Surgical History  Procedure Laterality Date  . Abdominal aortic aneurysm repair  05/21/2010    aorto bi-iliac [facility]  . Appendectomy  1949  . Popliteal synovial cyst excision Left   . Joint replacement      Knee and Hip replacements  . Total hip arthroplasty Left 1994  . Knee arthroplasty Left 2004  . Total hip revision Left 12/04/2012    Procedure: LEFT TOTAL HIP REVISION;  Surgeon: Mauri Pole, MD;  Location: [facility];  Service: Orthopedics;  Laterality: Left;  . Cardiac catheterization  2009    in [place]; 50-60% RCA  . Transthoracic echocardiogram  04/2010    EF =>55%; RV borderline dilated; LA mildly dilated; mild mitral annular calcif & mild MR; mild TR; mild calcif of AV leaflets with mild AV stenosis; aortic root sclerosis/calcif  . Orif femur fracture Left 01/22/2013    Procedure: OPEN REDUCTION INTERNAL FIXATION (ORIF) DISTAL FEMUR FRACTURE;  Surgeon: Mauri Pole, MD;  Location: [facility];  Service: Orthopedics;  Laterality: Left;   HPI:  80 year old, Caucasian male, presents with fever, altered mental status, worsening cough. Chest  x-ray suggests pneumonia in the left lung. According to the son patient had bilateral pneumonia at the SNF and then subsequently, was found to have right-sided pneumonia.  Dx: HCAP; Active Problems: chronic atrial flutter, coronary artery disease, essential hypertension, acute encephalopathy, CVA.  Dtr reports coughing with liquids recently.    Assessment / Plan / Recommendation Clinical Impression  Pt presents with generally functional swallow with mildly prolonged mastication given absence of most teeth; swift swallow trigger; occasional wet phonation and multiple swallows after thin liquid consumption.  Audible swallow.  Recommend beginning a dysphagia 3 (mechanical diet) until dentures are available.  Thin liquids.  Meds whole with puree.  SLP will f/u x1 given mild symptoms to ensure safety with this diet and adequate airway protection.   If coughing with liquids noted over weekend, please text page SLP [phone].)    Aspiration Risk  Mild    Diet Recommendation Dysphagia 3 (Mechanical Soft);Thin liquid   Liquid Administration via: Cup;Straw Medication Administration: Whole meds with puree Supervision: Patient able to self feed;Staff to assist with self feeding Compensations: Slow rate;Small sips/bites Postural Changes and/or Swallow Maneuvers: Seated upright 90 degrees    Other  Recommendations Oral Care Recommendations: Oral care BID   Follow Up Recommendations  None    Frequency and Duration min 1 x/week  1 week       SLP Swallow Goals     Swallow Study Prior Functional Status  General Date of Onset: 05/03/13 HPI: 80 year old, Caucasian male, presents with fever, altered mental status, worsening cough. Chest x-ray suggests pneumonia in the left lung. According to the son patient had bilateral pneumonia at the SNF and then subsequently, was found to have right-sided pneumonia.  Dx: HCAP; Active Problems: chronic atrial flutter, coronary artery disease, essential  hypertension, acute encephalopathy, CVA.  Dtr reports coughing with liquids recently.  Type of Study: Bedside swallow evaluation Previous Swallow Assessment: none Diet Prior to this Study: NPO Temperature Spikes Noted: No Respiratory Status: Nasal cannula Behavior/Cognition: Alert;Cooperative Oral Cavity - Dentition: Missing dentition Self-Feeding Abilities: Needs assist;Able to feed self Patient Positioning: Upright in bed Baseline Vocal Quality: Hoarse Volitional Cough: Weak Volitional Swallow: Able to elicit    Oral/Motor/Sensory Function Overall Oral Motor/Sensory Function: Appears within functional limits for tasks assessed   Ice Chips Ice chips: Within functional limits Presentation: Spoon   Thin Liquid Thin Liquid: Impaired Presentation: Cup;Straw Pharyngeal  Phase Impairments: Multiple swallows;Wet Vocal Quality (intermittent wet voice; no cough)    Nectar Thick Nectar Thick Liquid: Not tested   Honey Thick Honey Thick Liquid: Not tested   Puree Puree: Within functional limits Presentation: [place]. [place], [place] CCC/SLP Pager [phone]     Solid: Within functional limits Presentation: Self Fed       Juan Quam Laurice 05/04/2013,11:23 AM

## 2013-05-04 NOTE — Progress Notes (Signed)
Patient is being transferred to 3W bed#2 via bed. Phone report was called to [facility]. Have notified patient's son of the transfer with new room number. Patient also aware of the transfer.

## 2013-05-05 DIAGNOSIS — I6350 Cerebral infarction due to unspecified occlusion or stenosis of unspecified cerebral artery: Secondary | ICD-10-CM

## 2013-05-05 LAB — BASIC METABOLIC PANEL
BUN: 8 mg/dL (ref 6–23)
CALCIUM: 8.4 mg/dL (ref 8.4–10.5)
CO2: 25 mEq/L (ref 19–32)
Chloride: 101 mEq/L (ref 96–112)
Creatinine, Ser: 0.61 mg/dL (ref 0.50–1.35)
GFR, EST NON AFRICAN AMERICAN: 89 mL/min — AB (ref >90)
Glucose, Bld: 91 mg/dL (ref 70–99)
POTASSIUM: 3.3 meq/L — AB (ref 3.7–5.3)
SODIUM: 137 meq/L (ref 137–147)

## 2013-05-05 LAB — LEGIONELLA ANTIGEN, URINE: Legionella Antigen, Urine: NEGATIVE

## 2013-05-05 LAB — CBC
HCT: 35 % — ABNORMAL LOW (ref 39.0–52.0)
HEMOGLOBIN: 11.5 g/dL — AB (ref 13.0–17.0)
MCH: 29.2 pg (ref 26.0–34.0)
MCHC: 32.9 g/dL (ref 30.0–36.0)
MCV: 88.8 fL (ref 78.0–100.0)
PLATELETS: 184 10*3/uL (ref 150–400)
RBC: 3.94 MIL/uL — ABNORMAL LOW (ref 4.22–5.81)
RDW: 16.6 % — ABNORMAL HIGH (ref 11.5–15.5)
WBC: 11.5 10*3/uL — ABNORMAL HIGH (ref 4.0–10.5)

## 2013-05-05 LAB — LIPID PANEL
CHOL/HDL RATIO: 3.8 ratio
CHOLESTEROL: 84 mg/dL (ref 0–200)
HDL: 22 mg/dL — ABNORMAL LOW (ref >39)
LDL CALC: 44 mg/dL (ref 0–99)
TRIGLYCERIDES: 89 mg/dL (ref <150)
VLDL: 18 mg/dL (ref 0–40)

## 2013-05-05 MED ORDER — ASPIRIN EC 325 MG PO TBEC
325.0000 mg | DELAYED_RELEASE_TABLET | Freq: Every day | ORAL | Status: DC
  Administered 2013-05-05 – 2013-05-07 (×3): 325 mg via ORAL
  Filled 2013-05-05 (×3): qty 1

## 2013-05-05 MED ORDER — POTASSIUM CHLORIDE 10 MEQ/100ML IV SOLN
10.0000 meq | INTRAVENOUS | Status: AC
  Administered 2013-05-05 (×4): 10 meq via INTRAVENOUS
  Filled 2013-05-05 (×4): qty 100

## 2013-05-05 MED ORDER — FLUOXETINE HCL 10 MG PO CAPS
10.0000 mg | ORAL_CAPSULE | Freq: Every day | ORAL | Status: DC
  Administered 2013-05-05 – 2013-05-07 (×3): 10 mg via ORAL
  Filled 2013-05-05 (×3): qty 1

## 2013-05-05 MED ORDER — VITAMIN B-1 100 MG PO TABS
100.0000 mg | ORAL_TABLET | Freq: Every day | ORAL | Status: DC
  Administered 2013-05-05 – 2013-05-07 (×3): 100 mg via ORAL
  Filled 2013-05-05 (×3): qty 1

## 2013-05-05 NOTE — Progress Notes (Signed)
Stroke Team Progress Note  HISTORY  Austin [name] is an 80 y.o. male with history of HTN, Hyperlipidemia, Aflutter on Xarelto who was recently hospitalized for a femur fracture and sent to a SNF. While in the SNF he developed PNA was treated with vancomycin and Zosyn. The course of antibiotics finished on January 13. HE went home to live with his daughter last Friday . Over last few days he was noted to be SOB and warm to touch. On morning of admission he was noted to be confused and tachycardic. HE was brought to the hospital. CXR showed infiltrates. MRI was obtained due to confusion. MRI findings are consistent with three punctate acute infarcts in bilateral parietal and left frontal lobes. No pontine infarct as suspected by CT. No evidence of intracranial stenosis noted on MRA. Patient denies missing any home doses of Xarelto and he has been continued on Xarelto while in hospital. Currently he is alert, oriented to date, year and place , able to follow all commands with no weakness noted.   Patient was not a TPA candidate secondary to delay in presentation.   SUBJECTIVE No family is at bedside. The patient indicates that he feels well, does have intermittent cough. No headache, weakness or numbness of the extremities.  OBJECTIVE Most recent Vital Signs: Filed Vitals:   05/04/13 1500 05/04/13 2000 05/05/13 0000 05/05/13 0400  BP: 124/55 109/69 134/78 130/73  Pulse:  101 67 76  Temp: 97.9 F (36.6 C) 98.1 F (36.7 C) 98.3 F (36.8 C) 98.1 F (36.7 C)  TempSrc: Axillary     Resp:  20 18 16   Height:      Weight:      SpO2:  90% 95% 96%   CBG (last 3)  No results found for this basename: GLUCAP,  in the last 72 hours  IV Fluid Intake:   . sodium chloride 50 mL/hr at 05/04/13 2242    MEDICATIONS  . antiseptic oral rinse  15 mL Mouth Rinse q12n4p  . aspirin EC  325 mg Oral Daily  . buPROPion  100 mg Oral Daily  . ceFEPime (MAXIPIME) IV  1 g Intravenous Q12H  . chlorhexidine   15 mL Mouth Rinse BID  . digoxin  0.125 mg Oral Daily  . feeding supplement (RESOURCE BREEZE)  1 Container Oral BID BM  . FLUoxetine  10 mg Oral Daily  . levothyroxine  75 mcg Oral QAC breakfast  . multivitamin with minerals  1 tablet Oral Daily  . potassium chloride  10 mEq Intravenous Q1 Hr x 4  . Rivaroxaban  20 mg Oral QPC supper  . simvastatin  20 mg Oral q1800  . sodium chloride  3 mL Intravenous Q12H  . thiamine  100 mg Oral Daily  . vancomycin  1,250 mg Intravenous Q12H  . vancomycin  125 mg Oral QID   PRN:  acetaminophen, acetaminophen, levalbuterol, methocarbamol, ondansetron (ZOFRAN) IV, ondansetron, oxyCODONE  Diet:  Dysphagia 3, thin liquids Activity:   Up with assistance DVT Prophylaxis:  Xarelto  CLINICALLY SIGNIFICANT STUDIES Basic Metabolic Panel:  Recent Labs Lab 05/04/13 0248 05/05/13 0223  NA 135* 137  K 3.6* 3.3*  CL 101 101  CO2 23 25  GLUCOSE 88 91  BUN 8 8  CREATININE 0.59 0.61  CALCIUM 8.4 8.4   Liver Function Tests:  Recent Labs Lab 05/03/13 1319 05/04/13 0248  AST 20 15  ALT 17 12  ALKPHOS 94 130*  BILITOT 0.7 0.6  PROT 6.5 6.0  ALBUMIN 2.3* 2.1*   CBC:  Recent Labs Lab 05/03/13 1319 05/04/13 0248 05/05/13 0223  WBC 18.7* 17.7* 11.5*  NEUTROABS 15.7*  --   --   HGB 12.3* 11.3* 11.5*  HCT 36.7* 33.7* 35.0*  MCV 87.8 88.2 88.8  PLT 211 192 184   Coagulation:  Recent Labs Lab 05/03/13 1600  LABPROT 19.4*  INR 1.69*   Cardiac Enzymes:  Recent Labs Lab 05/03/13 1319  TROPONINI <0.30   Urinalysis:  Recent Labs Lab 05/03/13 1544  COLORURINE AMBER*  LABSPEC 1.023  PHURINE 5.0  GLUCOSEU NEGATIVE  HGBUR NEGATIVE  BILIRUBINUR NEGATIVE  KETONESUR NEGATIVE  PROTEINUR NEGATIVE  UROBILINOGEN 0.2  NITRITE NEGATIVE  LEUKOCYTESUR NEGATIVE   Lipid Panel    Component Value Date/Time   CHOL 84 05/05/2013 0215   TRIG 89 05/05/2013 0215   HDL 22* 05/05/2013 0215   CHOLHDL 3.8 05/05/2013 0215   VLDL 18 05/05/2013 0215    LDLCALC 44 05/05/2013 0215   HgbA1C  Lab Results  Component Value Date   HGBA1C 6.1* 05/04/2013    Urine Drug Screen:   No results found for this basename: labopia, cocainscrnur, labbenz, amphetmu, thcu, labbarb    Alcohol Level: No results found for this basename: ETH,  in the last 168 hours  Ct Head Wo Contrast  05/03/2013   CLINICAL DATA:  Fever and confusion  EXAM: CT HEAD WITHOUT CONTRAST  TECHNIQUE: Contiguous axial images were obtained from the base of the skull through the vertex without intravenous contrast. Study was obtained within 24 hr of patient's arrival at the emergency department.  COMPARISON:  None.  FINDINGS: There is mild diffuse atrophy. There is a small focus of increased attenuation in the posterior left parafalcine region 3 measuring 1.4 x 1.1 cm, felt 1 to represent a partially calcified meningioma. There is no surrounding edema in this area. There is no other evidence of mass. There is no appreciable acute hemorrhage. There is no extra-axial fluid or midline shift.  There is patchy small vessel disease in the centra semiovale bilaterally. There is decreased attenuation throughout the left pons compared to the right, concerning for recent infarct. No other findings felt to represent potential acute infarct identified.  The bony calvarium appears intact. The mastoid air cells are clear. There is mucosal thickening in the left maxillary antrum.  IMPRESSION: Suspect recent infarct in the left pons.  Atrophy with periventricular small vessel disease.  Small partially calcified meningioma posterior left parafalcine region. No associated edema or mass effect.  Left maxillary sinus disease.   Electronically Signed   By: Lowella Grip M.D.   On: 05/03/2013 13:54   Mr Jodene Nam Head Wo Contrast  05/04/2013   CLINICAL DATA:  Fever and confusion. Possible pontine infarct on head CT.  EXAM: MRI HEAD WITHOUT CONTRAST  MRA HEAD WITHOUT CONTRAST  TECHNIQUE: Multiplanar, multiecho pulse  sequences of the brain and surrounding structures were obtained without intravenous contrast. Angiographic images of the head were obtained using MRA technique without contrast.  COMPARISON:  Head CT 05/03/2013  FINDINGS: MRI HEAD FINDINGS  There is a punctate focus acute infarct involving medial right parietal cortex (series 5, image 28). An additional punctate focus of acute infarct is noted involving the left parietal cortex (series 5, image 29). There is also likely a third punctate focus of acute infarct involving left frontal cortex (series 5, image 30), although this cannot be confirmed on the ADC map and could be artifactual. Multiple foci of T2 hyperintensity within  the subcortical and deep cerebral white matter are nonspecific but compatible with mild chronic small vessel ischemic disease. There is moderate generalized cerebral atrophy. There is no signal abnormality in the pons to correspond to the region of apparent low density on the CT, which was likely artifactual. 1.3 x 0.9 cm left parafalcine mass is seen in the parietal region corresponding to partially calcified lesion described on head CT. There is no intracranial hemorrhage, midline shift, or extra-axial fluid collection. Major intracranial vascular flow voids are unremarkable. Prior left cataract surgery is noted. A small amount of left maxillary sinus fluid is present. There is a small left mastoid effusion.  MRA HEAD FINDINGS  The visualized distal vertebral arteries are patent. Left vertebral artery is slightly dominant, with the right vertebral artery being small distal to the PICA origin. PICA origins are patent bilaterally. AICA and SCA origins are also patent bilaterally. Basilar artery is patent without stenosis. The PCA origins and visualized branches are patent and unremarkable. A right posterior communicating artery is identified.  Internal carotid arteries are patent from skullbase to carotid terminus. ACA and MCA origins of the  branch vessels are patent. Incidental note is made of a median artery of the corpus callosum. No intracranial aneurysm is identified.  IMPRESSION: 1. Three punctate foci of acute infarct involving the bilateral parietal lobes and left frontal lobe. 2. No evidence of pontine infarct. 3. Small left parafalcine extra-axial mass, likely a meningioma. 4. No evidence of major intracranial arterial occlusion or flow limiting stenosis.   Electronically Signed   By: Logan Bores   On: 05/04/2013 13:53   Mr Brain Wo Contrast  05/04/2013   CLINICAL DATA:  Fever and confusion. Possible pontine infarct on head CT.  EXAM: MRI HEAD WITHOUT CONTRAST  MRA HEAD WITHOUT CONTRAST  TECHNIQUE: Multiplanar, multiecho pulse sequences of the brain and surrounding structures were obtained without intravenous contrast. Angiographic images of the head were obtained using MRA technique without contrast.  COMPARISON:  Head CT 05/03/2013  FINDINGS: MRI HEAD FINDINGS  There is a punctate focus acute infarct involving medial right parietal cortex (series 5, image 28). An additional punctate focus of acute infarct is noted involving the left parietal cortex (series 5, image 29). There is also likely a third punctate focus of acute infarct involving left frontal cortex (series 5, image 30), although this cannot be confirmed on the ADC map and could be artifactual. Multiple foci of T2 hyperintensity within the subcortical and deep cerebral white matter are nonspecific but compatible with mild chronic small vessel ischemic disease. There is moderate generalized cerebral atrophy. There is no signal abnormality in the pons to correspond to the region of apparent low density on the CT, which was likely artifactual. 1.3 x 0.9 cm left parafalcine mass is seen in the parietal region corresponding to partially calcified lesion described on head CT. There is no intracranial hemorrhage, midline shift, or extra-axial fluid collection. Major intracranial  vascular flow voids are unremarkable. Prior left cataract surgery is noted. A small amount of left maxillary sinus fluid is present. There is a small left mastoid effusion.  MRA HEAD FINDINGS  The visualized distal vertebral arteries are patent. Left vertebral artery is slightly dominant, with the right vertebral artery being small distal to the PICA origin. PICA origins are patent bilaterally. AICA and SCA origins are also patent bilaterally. Basilar artery is patent without stenosis. The PCA origins and visualized branches are patent and unremarkable. A right posterior communicating artery is identified.  Internal carotid arteries are patent from skullbase to carotid terminus. ACA and MCA origins of the branch vessels are patent. Incidental note is made of a median artery of the corpus callosum. No intracranial aneurysm is identified.  IMPRESSION: 1. Three punctate foci of acute infarct involving the bilateral parietal lobes and left frontal lobe. 2. No evidence of pontine infarct. 3. Small left parafalcine extra-axial mass, likely a meningioma. 4. No evidence of major intracranial arterial occlusion or flow limiting stenosis.   Electronically Signed   By: Logan Bores   On: 05/04/2013 13:53   Dg Chest Port 1 View  05/03/2013   CLINICAL DATA:  Fever.  EXAM: PORTABLE CHEST - 1 VIEW  COMPARISON:  January 26, 2013.  FINDINGS: Stable cardiomediastinal silhouette. New linear density seen in left perihilar region concerning for subsegmental listhesis or possibly early pneumonia. No pleural effusion or pneumothorax is noted. Stable bilateral interstitial opacities are noted most consistent with scarring.  IMPRESSION: New linear opacity seen in left midlung concerning for subsegmental atelectasis or possibly pneumonia. Followup radiographs are recommended.   Electronically Signed   By: Sabino Dick M.D.   On: 05/03/2013 13:44    CT of the brain   IMPRESSION:  Suspect recent infarct in the left pons.  Atrophy with  periventricular small vessel disease.  Small partially calcified meningioma posterior left parafalcine  region. No associated edema or mass effect.  Left maxillary sinus disease.  MRI of the brain   IMPRESSION:  1. Three punctate foci of acute infarct involving the bilateral  parietal lobes and left frontal lobe.  2. No evidence of pontine infarct.  3. Small left parafalcine extra-axial mass, likely a meningioma.  4. No evidence of major intracranial arterial occlusion or flow  limiting stenosis.  MRA of the brain   See above 2D Echocardiogram   Study Conclusions  - Left ventricle: The cavity size was normal. Wall thickness was normal. Systolic function was normal. The estimated ejection fraction was in the range of 55% to 60%. Wall motion was normal; there were no regional wall motion abnormalities. - Aortic valve: There was mild stenosis. Valve area: 1.1cm^2(VTI). Valve area: 1.11cm^2 (Vmax). - Right ventricle: The cavity size was moderately dilated. - Pulmonary arteries: Systolic pressure was mildly increased. PA peak pressure: 45mm Hg (S).   Carotid Doppler    CXR   IMPRESSION:  New linear opacity seen in left midlung concerning for subsegmental  atelectasis or possibly pneumonia. Followup radiographs are  recommended.   EKG   Sinus tachycardia Multiform ventricular premature complexes Nonspecific intraventricular conduction delay Borderline repolarization abnormality  Therapy Recommendations Pending  Physical Exam  General: The patient is alert and cooperative at the time of the examination.  Skin: No significant peripheral edema is noted.   Neurologic Exam  Mental status: The patient is oriented x 3.  Cranial nerves: Facial symmetry is present. Speech is dysphonic, not aphasic. Extraocular movements are full. Visual fields are full.  Motor: The patient has good strength in all 4 extremities.  Sensory examination: Soft touch sensation is symmetric on  the face, arms, and legs.  Coordination: The patient has good finger-nose-finger and heel-to-shin bilaterally.  Gait and station: The gait was not tested  Reflexes: Deep tendon reflexes are symmetric.    ASSESSMENT Austin [name] is a 80 y.o. male presenting with confusion, pneumonia.TPA was not administered secondary to minimal deficit. MRI the brain has shown punctate infarcts posteriorly, and in the left frontal area. Infarct suggest an  embolic etiology. The patient is on Xarelto prior to admission.     Atrial flutter, on Xarelto  Hypertension  Pneumonia  Abdominal aortic aneurysm  Dyslipidemia  Coronary artery disease  COPD  Hospital day # 2  TREATMENT/PLAN  Continue Xarelto for now    the patient is at baseline, likely does not need therapy ongoing  carotid Doppler study is pending  Mobilize patient  Physical therapy to see  Lenor Coffin  05/05/2013 10:08 AM

## 2013-05-05 NOTE — Progress Notes (Signed)
VASCULAR LAB PRELIMINARY  PRELIMINARY  PRELIMINARY  PRELIMINARY  Carotid duplex completed.    Preliminary report:  Bilateral:  1-39% ICA stenosis.  Vertebral artery flow is antegrade.     Thierry Dobosz, RVS 05/05/2013, 2:13 PM

## 2013-05-05 NOTE — Evaluation (Signed)
Physical Therapy Evaluation Patient Details Name: Austin [name] MRN: [id number] DOB: 06-23-1926 Today's Date: 05/05/2013 Time: 4782-9562 PT Time Calculation (min): 28 min  PT Assessment / Plan / Recommendation History of Present Illness  Pt s/p ORIF to L distal femur fx. 80 y.o. male who just had a L hip replacement 6 weeks ago (also has h/o L knee replacement 9 years ago), who unfortunately suffered a fall when his leg gave out on him (no LOC).  Unfortunately he landed on his L leg.  Leg was grossly deformed and movement very painful, (pain worse with movement, better with rest).  Clinical Impression  Pt admitted with problems stated above. Pt currently with functional limitations due to the deficits listed below (see PT Problem List).  Pt will benefit from skilled PT to increase their independence and safety with mobility to allow discharge to home with available assist.      PT Assessment  Patient needs continued PT services    Follow Up Recommendations  Home health PT    Does the patient have the potential to tolerate intense rehabilitation      Barriers to Discharge        Equipment Recommendations  None recommended by PT    Recommendations for Other Services     Frequency Min 3X/week    Precautions / Restrictions Precautions Precautions: Fall Restrictions Weight Bearing Restrictions: Yes LLE Weight Bearing: Non weight bearing   Pertinent Vitals/Pain       Mobility  Bed Mobility Overal bed mobility: Needs Assistance Bed Mobility: Supine to Sit Supine to sit: HOB elevated;Mod assist General bed mobility comments: stiff/rigid movements Transfers Overall transfer level: Needs assistance Transfers: Sit to/from Stand;Squat Pivot Transfers Sit to Stand: +2 physical assistance (unable to attain stand and was aborted) Squat pivot transfers: Mod assist;+2 physical assistance General transfer comment: still NWB LLE?    Exercises     PT Diagnosis: Difficulty  walking;Generalized weakness  PT Problem List: Decreased strength;Decreased activity tolerance;Decreased mobility PT Treatment Interventions: Gait training;Therapeutic exercise;Neuromuscular re-education     PT Goals(Current goals can be found in the care plan section) Acute Rehab PT Goals Patient Stated Goal: go home PT Goal Formulation: With patient Time For Goal Achievement: 05/05/13 Potential to Achieve Goals: Good  Visit Information  Last PT Received On: 05/05/13 Assistance Needed: +2 History of Present Illness: Pt s/p ORIF to L distal femur fx. 80 y.o. male who just had a L hip replacement 6 weeks ago (also has h/o L knee replacement 9 years ago), who unfortunately suffered a fall when his leg gave out on him (no LOC).  Unfortunately he landed on his L leg.  Leg was grossly deformed and movement very painful, (pain worse with movement, better with rest).       Prior [place] expects to be discharged to:: Private residence Living Arrangements: Children Available Help at Discharge: Available 24 hours/day Type of Home: House Home Access: Stairs to enter [employer] of Steps: 2 Home Layout: Two level;Able to live on main level with bedroom/bathroom Home Equipment: Gilford Rile - 2 wheels;Cane - single point;Bedside commode;Wheelchair - manual;Hospital bed (hoyer lift) Additional Comments: pt desires to return to [place]. [place] Prior Function Level of Independence: Needs assistance Gait / Transfers Assistance Needed: used RW Comments: Pt states that before breaking his hip he was mod I with ADL and moblity Communication Communication: No difficulties Dominant Hand: Right    Cognition  Cognition Arousal/Alertness: Awake/alert Behavior During Therapy: [facility]  for tasks assessed/performed Overall Cognitive Status: No family/caregiver present to determine baseline cognitive functioning    Extremity/Trunk Assessment Upper Extremity  Assessment Upper Extremity Assessment: Defer to OT evaluation Lower Extremity Assessment Lower Extremity Assessment: Generalized weakness;RLE deficits/detail;LLE deficits/detail RLE Deficits / Details: stiff and grossly 3+/5 LLE Deficits / Details: stiff and generally weak.  not fully tested. Cervical / Trunk Assessment Cervical / Trunk Assessment: Kyphotic   Balance Balance Overall balance assessment: Needs assistance Sitting-balance support: No upper extremity supported;Feet supported Sitting balance-Leahy Scale: Good Sitting balance - Comments: Could accept w/s challenges to scoot to EOB  End of Session PT - End of Session Equipment Utilized During Treatment: Gait belt Activity Tolerance: Patient tolerated treatment well Patient left: in chair;with call bell/phone within reach Nurse Communication: Mobility status  GP     Candra Wegner, Tessie Fass 05/05/2013, 4:55 PM

## 2013-05-05 NOTE — Plan of Care (Signed)
Problem: Phase I Progression Outcomes Goal: OOB as tolerated unless otherwise ordered Outcome: Completed/Met Date Met:  05/05/13 Tol OOB up in chair with NAD

## 2013-05-05 NOTE — Progress Notes (Signed)
   CARE MANAGEMENT NOTE 05/05/2013  Patient:  LEELAN, RAJEWSKI   Account Number:  [id number]  Date Initiated:  05/04/2013  Documentation initiated by:  Marvetta Gibbons  Subjective/Objective Assessment:   Pt admitted with HCAP     Action/Plan:   PTA pt was at home- recently d/c'd from SNF-rehab and was living with his daugher- NCM to follow for d/c needs   Anticipated DC Date:  05/08/2013   Anticipated DC Plan:        [place]  CM consult      [facility] Choice  Resumption Of Svcs/PTA Provider   Choice offered to / List presented to:             [place]   Status of service:  In process, will continue to follow Medicare Important Message given?   (If response is "NO", the following Medicare IM given date fields will be blank) Date Medicare IM given:   Date Additional Medicare IM given:    Discharge Disposition:    Per UR Regulation:  Reviewed for med. necessity/level of care/duration of stay  If discussed at [place] of Stay Meetings, dates discussed:    Comments:  05/05/13 15:33 CM notified of probable disharge of pt 05/06/13 and will resume HH with [place].  CM will notify [facility] when orders are placed.  "Sticky" note for actual orders placed.  CM wil continue to follow for disposition needs.  Mariane Masters, BSN, CM [phone].

## 2013-05-05 NOTE — Progress Notes (Signed)
[facility] PROGRESS NOTE  JAQUON GINGERICH QQP:[id number] DOB: Oct 08, 1926 DOA: 05/03/2013  PCP: [name], MD  Brief HPI: Austin [name] is a 80 y.o. male with a past medical history of atrial flutter, hypertension, hospitalization in October for femur fracture. Patient was discharged from the hospital at that time to a skilled nursing facility for rehabilitation. Towards the end of stay in the skilled nursing facility patient developed a pneumonia. It looks like he was treated with vancomycin and Zosyn. The course of antibiotics finished on January 13. He came home to live with his daughter on 1/9. His cough had improved. However, in the last few days prior to admission the family noted that the patient has been coughing more. He was also confused. So he was brought in to the hospital. He was found to CXR suspicious for infiltrate. There was also a history of diarrhea at home.  Past medical history:  Past Medical History  Diagnosis Date  . Hypertension   . Hyperlipidemia   . AAA (abdominal aortic aneurysm)   . Arthritis   . CAD (coronary artery disease)   . Thyroid disease     hypothyroidism  . Dysrhythmia     atrial flutter  . Shortness of breath     a little with exertion  . Polio 1934  . Atrial flutter     chronic anticoagulation  . COPD (chronic obstructive pulmonary disease)   . History of nuclear stress test 04/2010    dipyridamole; small, mostly fixed basal to mid inferior and inferoseptal defect (gut artifact v. scar); post stress EF 62%; non-diagnostic for ischemia; low risk     Consultants: Neurology  Procedures:  2D ECHO 1/16 Study Conclusions - Left ventricle: The cavity size was normal. Wall thickness was normal. Systolic function was normal. The estimated ejection fraction was in the range of 55% to 60%. Wall motion was normal; there were no regional wall motion abnormalities. - Aortic valve: There was mild stenosis. Valve area: 1.1cm^2(VTI).  Valve area: 1.11cm^2 (Vmax). - Right ventricle: The cavity size was moderately dilated. - Pulmonary arteries: Systolic pressure was mildly increased. PA peak pressure: 49mm Hg (S).  Carotid Dopplers  Pending  Antibiotics: Vanc 1/15--> Cefepime 1/15--> Tamiflu 1/15-->1/16 Oral Vanc 1/16-->  Subjective: Patient continues to improve. No BM's since last night. Denies abdominal pain. Denies shortness of breath. No nausea or vomiting.  Objective: Vital Signs  Filed Vitals:   05/04/13 1500 05/04/13 2000 05/05/13 0000 05/05/13 0400  BP: 124/55 109/69 134/78 130/73  Pulse:  101 67 76  Temp: 97.9 F (36.6 C) 98.1 F (36.7 C) 98.3 F (36.8 C) 98.1 F (36.7 C)  TempSrc: Axillary     Resp:  20 18 16   Height:      Weight:      SpO2:  90% 95% 96%    Intake/Output Summary (Last 24 hours) at 05/05/13 0818 Last data filed at 05/05/13 0600  Gross per 24 hour  Intake    840 ml  Output   1500 ml  Net   -660 ml   Filed Weights   05/03/13 1500 05/03/13 2020  Weight: 130 kg (286 lb 9.6 oz) 108.5 kg (239 lb 3.2 oz)    General appearance: alert, cooperative, fatigued and no distress Resp: Improved air entry bilaterally. coarse breath sounds at bases with few crackls. No wheezing. Cardio: regular rate and rhythm, S1, S2 normal, no murmur, click, rub or gallop GI: soft, non-tender; bowel sounds normal; no masses,  no  organomegaly Extremities: extremities normal, atraumatic, no cyanosis or edema Pulses: 2+ and symmetric Neurologic: Alert. No focal deficits.  Lab Results:  Basic Metabolic Panel:  Recent Labs Lab 05/03/13 1319 05/04/13 0248 05/05/13 0223  NA 134* 135* 137  K 3.4* 3.6* 3.3*  CL 97 101 101  CO2 24 23 25   GLUCOSE 108* 88 91  BUN 9 8 8   CREATININE 0.67 0.59 0.61  CALCIUM 8.7 8.4 8.4   Liver Function Tests:  Recent Labs Lab 05/03/13 1319 05/04/13 0248  AST 20 15  ALT 17 12  ALKPHOS 94 130*  BILITOT 0.7 0.6  PROT 6.5 6.0  ALBUMIN 2.3* 2.1*    Recent  Labs Lab 05/03/13 1319  LIPASE 12   CBC:  Recent Labs Lab 05/03/13 1319 05/04/13 0248 05/05/13 0223  WBC 18.7* 17.7* 11.5*  NEUTROABS 15.7*  --   --   HGB 12.3* 11.3* 11.5*  HCT 36.7* 33.7* 35.0*  MCV 87.8 88.2 88.8  PLT 211 192 184   Cardiac Enzymes:  Recent Labs Lab 05/03/13 1319  TROPONINI <0.30   BNP (last 3 results)  Recent Labs  05/03/13 1319  PROBNP 622.4*     Recent Results (from the past 240 hour(s))  CULTURE, BLOOD (ROUTINE X 2)     Status: None   Collection Time    05/03/13  3:05 PM      Result Value Range Status   Specimen Description BLOOD ARM RIGHT   Final   Special Requests BOTTLES DRAWN AEROBIC ONLY 5CC   Final   Culture  Setup Time     Final   Value: 05/03/2013 20:19     Performed at [facility]   Culture     Final   Value:        BLOOD CULTURE RECEIVED NO GROWTH TO DATE CULTURE WILL BE HELD FOR 5 DAYS BEFORE ISSUING A FINAL NEGATIVE REPORT     Performed at [facility]   Report Status PENDING   Incomplete  CULTURE, BLOOD (ROUTINE X 2)     Status: None   Collection Time    05/03/13  4:00 PM      Result Value Range Status   Specimen Description BLOOD LEFT FOREARM   Final   Special Requests BOTTLES DRAWN AEROBIC AND ANAEROBIC 5CC   Final   Culture  Setup Time     Final   Value: 05/03/2013 21:11     Performed at [facility]   Culture     Final   Value:        BLOOD CULTURE RECEIVED NO GROWTH TO DATE CULTURE WILL BE HELD FOR 5 DAYS BEFORE ISSUING A FINAL NEGATIVE REPORT     Performed at [facility]   Report Status PENDING   Incomplete  CLOSTRIDIUM DIFFICILE BY PCR     Status: Abnormal   Collection Time    05/03/13  8:20 PM      Result Value Range Status   C difficile by pcr POSITIVE (*) NEGATIVE Final   Comment: CRITICAL RESULT CALLED TO, READ BACK BY AND VERIFIED WITH:     Tracie Harrier RN 12:40 05/04/13 ([email or web address])  MRSA PCR SCREENING     Status: None   Collection Time    05/04/13  7:08 AM       Result Value Range Status   MRSA by PCR NEGATIVE  NEGATIVE Final   Comment:            The GeneXpert MRSA  Assay (FDA     approved for NASAL specimens     only), is one component of a     comprehensive MRSA colonization     surveillance program. It is not     intended to diagnose MRSA     infection nor to guide or     monitor treatment for     MRSA infections.      Studies/Results: Ct Head Wo Contrast  05/03/2013   CLINICAL DATA:  Fever and confusion  EXAM: CT HEAD WITHOUT CONTRAST  TECHNIQUE: Contiguous axial images were obtained from the base of the skull through the vertex without intravenous contrast. Study was obtained within 24 hr of patient's arrival at the emergency department.  COMPARISON:  None.  FINDINGS: There is mild diffuse atrophy. There is a small focus of increased attenuation in the posterior left parafalcine region 3 measuring 1.4 x 1.1 cm, felt 1 to represent a partially calcified meningioma. There is no surrounding edema in this area. There is no other evidence of mass. There is no appreciable acute hemorrhage. There is no extra-axial fluid or midline shift.  There is patchy small vessel disease in the centra semiovale bilaterally. There is decreased attenuation throughout the left pons compared to the right, concerning for recent infarct. No other findings felt to represent potential acute infarct identified.  The bony calvarium appears intact. The mastoid air cells are clear. There is mucosal thickening in the left maxillary antrum.  IMPRESSION: Suspect recent infarct in the left pons.  Atrophy with periventricular small vessel disease.  Small partially calcified meningioma posterior left parafalcine region. No associated edema or mass effect.  Left maxillary sinus disease.   Electronically Signed   By: Lowella Grip M.D.   On: 05/03/2013 13:54   Mr Jodene Nam Head Wo Contrast  05/04/2013   CLINICAL DATA:  Fever and confusion. Possible pontine infarct on head CT.  EXAM: MRI  HEAD WITHOUT CONTRAST  MRA HEAD WITHOUT CONTRAST  TECHNIQUE: Multiplanar, multiecho pulse sequences of the brain and surrounding structures were obtained without intravenous contrast. Angiographic images of the head were obtained using MRA technique without contrast.  COMPARISON:  Head CT 05/03/2013  FINDINGS: MRI HEAD FINDINGS  There is a punctate focus acute infarct involving medial right parietal cortex (series 5, image 28). An additional punctate focus of acute infarct is noted involving the left parietal cortex (series 5, image 29). There is also likely a third punctate focus of acute infarct involving left frontal cortex (series 5, image 30), although this cannot be confirmed on the ADC map and could be artifactual. Multiple foci of T2 hyperintensity within the subcortical and deep cerebral white matter are nonspecific but compatible with mild chronic small vessel ischemic disease. There is moderate generalized cerebral atrophy. There is no signal abnormality in the pons to correspond to the region of apparent low density on the CT, which was likely artifactual. 1.3 x 0.9 cm left parafalcine mass is seen in the parietal region corresponding to partially calcified lesion described on head CT. There is no intracranial hemorrhage, midline shift, or extra-axial fluid collection. Major intracranial vascular flow voids are unremarkable. Prior left cataract surgery is noted. A small amount of left maxillary sinus fluid is present. There is a small left mastoid effusion.  MRA HEAD FINDINGS  The visualized distal vertebral arteries are patent. Left vertebral artery is slightly dominant, with the right vertebral artery being small distal to the PICA origin. PICA origins are patent bilaterally. AICA and SCA  origins are also patent bilaterally. Basilar artery is patent without stenosis. The PCA origins and visualized branches are patent and unremarkable. A right posterior communicating artery is identified.  Internal  carotid arteries are patent from skullbase to carotid terminus. ACA and MCA origins of the branch vessels are patent. Incidental note is made of a median artery of the corpus callosum. No intracranial aneurysm is identified.  IMPRESSION: 1. Three punctate foci of acute infarct involving the bilateral parietal lobes and left frontal lobe. 2. No evidence of pontine infarct. 3. Small left parafalcine extra-axial mass, likely a meningioma. 4. No evidence of major intracranial arterial occlusion or flow limiting stenosis.   Electronically Signed   By: Logan Bores   On: 05/04/2013 13:53   Mr Brain Wo Contrast  05/04/2013   CLINICAL DATA:  Fever and confusion. Possible pontine infarct on head CT.  EXAM: MRI HEAD WITHOUT CONTRAST  MRA HEAD WITHOUT CONTRAST  TECHNIQUE: Multiplanar, multiecho pulse sequences of the brain and surrounding structures were obtained without intravenous contrast. Angiographic images of the head were obtained using MRA technique without contrast.  COMPARISON:  Head CT 05/03/2013  FINDINGS: MRI HEAD FINDINGS  There is a punctate focus acute infarct involving medial right parietal cortex (series 5, image 28). An additional punctate focus of acute infarct is noted involving the left parietal cortex (series 5, image 29). There is also likely a third punctate focus of acute infarct involving left frontal cortex (series 5, image 30), although this cannot be confirmed on the ADC map and could be artifactual. Multiple foci of T2 hyperintensity within the subcortical and deep cerebral white matter are nonspecific but compatible with mild chronic small vessel ischemic disease. There is moderate generalized cerebral atrophy. There is no signal abnormality in the pons to correspond to the region of apparent low density on the CT, which was likely artifactual. 1.3 x 0.9 cm left parafalcine mass is seen in the parietal region corresponding to partially calcified lesion described on head CT. There is no  intracranial hemorrhage, midline shift, or extra-axial fluid collection. Major intracranial vascular flow voids are unremarkable. Prior left cataract surgery is noted. A small amount of left maxillary sinus fluid is present. There is a small left mastoid effusion.  MRA HEAD FINDINGS  The visualized distal vertebral arteries are patent. Left vertebral artery is slightly dominant, with the right vertebral artery being small distal to the PICA origin. PICA origins are patent bilaterally. AICA and SCA origins are also patent bilaterally. Basilar artery is patent without stenosis. The PCA origins and visualized branches are patent and unremarkable. A right posterior communicating artery is identified.  Internal carotid arteries are patent from skullbase to carotid terminus. ACA and MCA origins of the branch vessels are patent. Incidental note is made of a median artery of the corpus callosum. No intracranial aneurysm is identified.  IMPRESSION: 1. Three punctate foci of acute infarct involving the bilateral parietal lobes and left frontal lobe. 2. No evidence of pontine infarct. 3. Small left parafalcine extra-axial mass, likely a meningioma. 4. No evidence of major intracranial arterial occlusion or flow limiting stenosis.   Electronically Signed   By: Logan Bores   On: 05/04/2013 13:53   Dg Chest Port 1 View  05/03/2013   CLINICAL DATA:  Fever.  EXAM: PORTABLE CHEST - 1 VIEW  COMPARISON:  January 26, 2013.  FINDINGS: Stable cardiomediastinal silhouette. New linear density seen in left perihilar region concerning for subsegmental listhesis or possibly early pneumonia. No pleural  effusion or pneumothorax is noted. Stable bilateral interstitial opacities are noted most consistent with scarring.  IMPRESSION: New linear opacity seen in left midlung concerning for subsegmental atelectasis or possibly pneumonia. Followup radiographs are recommended.   Electronically Signed   By: Sabino Dick M.D.   On: 05/03/2013 13:44     Medications:  Scheduled: . antiseptic oral rinse  15 mL Mouth Rinse q12n4p  . aspirin EC  325 mg Oral Daily  . buPROPion  100 mg Oral Daily  . ceFEPime (MAXIPIME) IV  1 g Intravenous Q12H  . chlorhexidine  15 mL Mouth Rinse BID  . digoxin  0.125 mg Oral Daily  . feeding supplement (RESOURCE BREEZE)  1 Container Oral BID BM  . FLUoxetine  20 mg Oral Daily  . levothyroxine  75 mcg Oral QAC breakfast  . multivitamin with minerals  1 tablet Oral Daily  . Rivaroxaban  20 mg Oral QPC supper  . simvastatin  20 mg Oral q1800  . sodium chloride  3 mL Intravenous Q12H  . thiamine  100 mg Intravenous Daily  . vancomycin  1,250 mg Intravenous Q12H  . vancomycin  125 mg Oral QID   Continuous: . sodium chloride 50 mL/hr at 05/04/13 2242   PYP:[id number], acetaminophen, levalbuterol, methocarbamol, ondansetron (ZOFRAN) IV, ondansetron, oxyCODONE  Assessment/Plan:  Principal Problem:   HCAP (healthcare-associated pneumonia) Active Problems:   Chronic atrial flutter   Coronary artery disease   Essential hypertension   Acute encephalopathy   CVA (cerebrovascular accident)   C. difficile diarrhea   Protein-calorie malnutrition, severe    Healthcare associated pneumonia Continue broad spectrum coverage for now. However de-escalate quickly based on cultures considering positive c diff. Influenza was negative. Blood culture negative so far. It has not been 48hrs yet since cultures were drawn.   C diff Diarrhea Seems to be improving. Abdomen is soft. Continue oral Vanc. Contact precautions.  Acute CVA He does not have any focal deficits at this time. MRI report reviewed. Neurology has seen. [facility] report reviewed. Seen by SLP and on Dys 3 diet. Carotid Dopplers pending.   A Flutter HR is better. Will repeat EKG. Continue Digoxin. Monitor on tele.  Acute encephalopathy Improved and seems to be at baseline. Most likely due to infection/dehydration. Daughter states he has been  somewhat confused ever since he was started on Prozac and Welbutrin. She requests he be taken off. I explained to her that this has to be done gradually. Will decrease dose of Prozac.   On chronic anticoagulation with Xarelto for Atrial Flutter This will be continued.   History of hypothyroidism Continue with l-thyroxine.   Left femur fracture s/p repair in October He continues to be nonweightbearing on the left lower extremity. He has a followup with his orthopedic surgeon (Dr. Alvan Dame) sometime this month.   DVT Prophylaxis: He is on full anticoagulation  Code Status: Full code  Family Communication: Discussed with daughter Disposition Plan: Anticipate discharge 1/19 with HH.    LOS: 2 days   [facility] Pager [phone] 05/05/2013, 8:18 AM  If 8PM-8AM, please contact night-coverage at [email or web address], password [facility]

## 2013-05-05 NOTE — Progress Notes (Signed)
1 sm episode of loose mucousy yellow stool this shift

## 2013-05-06 LAB — BASIC METABOLIC PANEL
BUN: 5 mg/dL — ABNORMAL LOW (ref 6–23)
CALCIUM: 8.2 mg/dL — AB (ref 8.4–10.5)
CHLORIDE: 99 meq/L (ref 96–112)
CO2: 24 mEq/L (ref 19–32)
Creatinine, Ser: 0.54 mg/dL (ref 0.50–1.35)
GFR calc Af Amer: >90 mL/min (ref >90)
GFR calc non Af Amer: >90 mL/min (ref >90)
Glucose, Bld: 89 mg/dL (ref 70–99)
POTASSIUM: 3.2 meq/L — AB (ref 3.7–5.3)
SODIUM: 136 meq/L — AB (ref 137–147)

## 2013-05-06 LAB — CBC
HCT: 35.4 % — ABNORMAL LOW (ref 39.0–52.0)
HEMOGLOBIN: 11.9 g/dL — AB (ref 13.0–17.0)
MCH: 29.2 pg (ref 26.0–34.0)
MCHC: 33.6 g/dL (ref 30.0–36.0)
MCV: 87 fL (ref 78.0–100.0)
PLATELETS: 204 10*3/uL (ref 150–400)
RBC: 4.07 MIL/uL — AB (ref 4.22–5.81)
RDW: 16.5 % — ABNORMAL HIGH (ref 11.5–15.5)
WBC: 7.7 10*3/uL (ref 4.0–10.5)

## 2013-05-06 MED ORDER — VANCOMYCIN 50 MG/ML ORAL SOLUTION
125.0000 mg | Freq: Four times a day (QID) | ORAL | Status: DC
  Administered 2013-05-06 – 2013-05-07 (×5): 125 mg via ORAL
  Filled 2013-05-06 (×8): qty 2.5

## 2013-05-06 MED ORDER — POTASSIUM CHLORIDE CRYS ER 20 MEQ PO TBCR
40.0000 meq | EXTENDED_RELEASE_TABLET | ORAL | Status: AC
  Administered 2013-05-06 (×2): 40 meq via ORAL
  Filled 2013-05-06 (×2): qty 2

## 2013-05-06 MED ORDER — LEVOFLOXACIN 500 MG PO TABS
500.0000 mg | ORAL_TABLET | Freq: Every day | ORAL | Status: DC
  Administered 2013-05-06 – 2013-05-07 (×2): 500 mg via ORAL
  Filled 2013-05-06 (×2): qty 1

## 2013-05-06 NOTE — Progress Notes (Signed)
Pt incontinent of UA x 2 this shift. Tol OOB up in chair using hoyer lift, tol well

## 2013-05-06 NOTE — Progress Notes (Signed)
[facility] PROGRESS NOTE  MARQUEE FUCHS UEK:[id number] DOB: [birth year] DOA: 05/03/2013  PCP: [name], MD  Brief HPI: Austin [name] is a 80 y.o. male with a past medical history of atrial flutter, hypertension, hospitalization in October for femur fracture. Patient was discharged from the hospital at that time to a skilled nursing facility for rehabilitation. Towards the end of stay in the skilled nursing facility patient developed a pneumonia. It looks like he was treated with vancomycin and Zosyn. The course of antibiotics finished on January 13. He came home to live with his daughter on 1/9. His cough had improved. However, in the last few days prior to admission the family noted that the patient has been coughing more. He was also confused. So he was brought in to the hospital. He was found to CXR suspicious for infiltrate. There was also a history of diarrhea at home.  Past medical history:  Past Medical History  Diagnosis Date  . Hypertension   . Hyperlipidemia   . AAA (abdominal aortic aneurysm)   . Arthritis   . CAD (coronary artery disease)   . Thyroid disease     hypothyroidism  . Dysrhythmia     atrial flutter  . Shortness of breath     a little with exertion  . Polio 1934  . Atrial flutter     chronic anticoagulation  . COPD (chronic obstructive pulmonary disease)   . History of nuclear stress test 04/2010    dipyridamole; small, mostly fixed basal to mid inferior and inferoseptal defect (gut artifact v. scar); post stress EF 62%; non-diagnostic for ischemia; low risk     Consultants: Neurology  Procedures:  2D ECHO 1/16 Study Conclusions - Left ventricle: The cavity size was normal. Wall thickness was normal. Systolic function was normal. The estimated ejection fraction was in the range of 55% to 60%. Wall motion was normal; there were no regional wall motion abnormalities. - Aortic valve: There was mild stenosis. Valve area: 1.1cm^2(VTI).  Valve area: 1.11cm^2 (Vmax). - Right ventricle: The cavity size was moderately dilated. - Pulmonary arteries: Systolic pressure was mildly increased. PA peak pressure: 69mm Hg (S).  Carotid Dopplers 1/17 Bilateral: 1-39% ICA stenosis. Vertebral artery flow is antegrade.   Antibiotics: Vanc 1/15-->1/18 Cefepime 1/15-->1/18 Tamiflu 1/15-->1/16 Oral Vanc 1/16--> Levaquin 1/18-->  Subjective: Patient feels better. One loose stool today. Denies abdominal pain. Cough is better. Breathing better.   Objective: Vital Signs  Filed Vitals:   05/05/13 1400 05/05/13 2230 05/06/13 0056 05/06/13 0516  BP: 125/76 112/63 157/69 130/82  Pulse: 84 69 72 106  Temp: 99.4 F (37.4 C) 99.1 F (37.3 C) 98.7 F (37.1 C) 98.9 F (37.2 C)  TempSrc: Oral Oral Oral Oral  Resp: 17 18 18 18   Height:      Weight:      SpO2: 97% 97% 95% 94%    Intake/Output Summary (Last 24 hours) at 05/06/13 1044 Last data filed at 05/06/13 0939  Gross per 24 hour  Intake    556 ml  Output    775 ml  Net   -219 ml   Filed Weights   05/03/13 1500 05/03/13 2020  Weight: 130 kg (286 lb 9.6 oz) 108.5 kg (239 lb 3.2 oz)    General appearance: alert, cooperative, fatigued and no distress Resp: Improved air entry bilaterally. coarse breath sounds at bases with few crackls. No wheezing. Cardio: regular rate and rhythm, S1, S2 normal, no murmur, click, rub or gallop GI: soft,  non-tender; bowel sounds normal; no masses,  no organomegaly Extremities: extremities normal, atraumatic, no cyanosis or edema Neurologic: Alert. No focal deficits.  Lab Results:  Basic Metabolic Panel:  Recent Labs Lab 05/03/13 1319 05/04/13 0248 05/05/13 0223 05/06/13 0522  NA 134* 135* 137 136*  K 3.4* 3.6* 3.3* 3.2*  CL 97 101 101 99  CO2 24 23 25 24   GLUCOSE 108* 88 91 89  BUN 9 8 8  5*  CREATININE 0.67 0.59 0.61 0.54  CALCIUM 8.7 8.4 8.4 8.2*   Liver Function Tests:  Recent Labs Lab 05/03/13 1319 05/04/13 0248  AST 20  15  ALT 17 12  ALKPHOS 94 130*  BILITOT 0.7 0.6  PROT 6.5 6.0  ALBUMIN 2.3* 2.1*    Recent Labs Lab 05/03/13 1319  LIPASE 12   CBC:  Recent Labs Lab 05/03/13 1319 05/04/13 0248 05/05/13 0223 05/06/13 0522  WBC 18.7* 17.7* 11.5* 7.7  NEUTROABS 15.7*  --   --   --   HGB 12.3* 11.3* 11.5* 11.9*  HCT 36.7* 33.7* 35.0* 35.4*  MCV 87.8 88.2 88.8 87.0  PLT 211 192 184 204   Cardiac Enzymes:  Recent Labs Lab 05/03/13 1319  TROPONINI <0.30   BNP (last 3 results)  Recent Labs  05/03/13 1319  PROBNP 622.4*     Recent Results (from the past 240 hour(s))  CULTURE, BLOOD (ROUTINE X 2)     Status: None   Collection Time    05/03/13  3:05 PM      Result Value Range Status   Specimen Description BLOOD ARM RIGHT   Final   Special Requests BOTTLES DRAWN AEROBIC ONLY 5CC   Final   Culture  Setup Time     Final   Value: 05/03/2013 20:19     Performed at [facility]   Culture     Final   Value:        BLOOD CULTURE RECEIVED NO GROWTH TO DATE CULTURE WILL BE HELD FOR 5 DAYS BEFORE ISSUING A FINAL NEGATIVE REPORT     Performed at [facility]   Report Status PENDING   Incomplete  CULTURE, BLOOD (ROUTINE X 2)     Status: None   Collection Time    05/03/13  4:00 PM      Result Value Range Status   Specimen Description BLOOD LEFT FOREARM   Final   Special Requests BOTTLES DRAWN AEROBIC AND ANAEROBIC 5CC   Final   Culture  Setup Time     Final   Value: 05/03/2013 21:11     Performed at [facility]   Culture     Final   Value:        BLOOD CULTURE RECEIVED NO GROWTH TO DATE CULTURE WILL BE HELD FOR 5 DAYS BEFORE ISSUING A FINAL NEGATIVE REPORT     Performed at [facility]   Report Status PENDING   Incomplete  CLOSTRIDIUM DIFFICILE BY PCR     Status: Abnormal   Collection Time    05/03/13  8:20 PM      Result Value Range Status   C difficile by pcr POSITIVE (*) NEGATIVE Final   Comment: CRITICAL RESULT CALLED TO, READ BACK BY AND  VERIFIED WITH:     Tracie Harrier RN 12:40 05/04/13 ([email or web address])  MRSA PCR SCREENING     Status: None   Collection Time    05/04/13  7:08 AM      Result Value Range Status  MRSA by PCR NEGATIVE  NEGATIVE Final   Comment:            The GeneXpert MRSA Assay (FDA     approved for NASAL specimens     only), is one component of a     comprehensive MRSA colonization     surveillance program. It is not     intended to diagnose MRSA     infection nor to guide or     monitor treatment for     MRSA infections.      Studies/Results: Mr Virgel Paling Wo Contrast  05/04/2013   CLINICAL DATA:  Fever and confusion. Possible pontine infarct on head CT.  EXAM: MRI HEAD WITHOUT CONTRAST  MRA HEAD WITHOUT CONTRAST  TECHNIQUE: Multiplanar, multiecho pulse sequences of the brain and surrounding structures were obtained without intravenous contrast. Angiographic images of the head were obtained using MRA technique without contrast.  COMPARISON:  Head CT 05/03/2013  FINDINGS: MRI HEAD FINDINGS  There is a punctate focus acute infarct involving medial right parietal cortex (series 5, image 28). An additional punctate focus of acute infarct is noted involving the left parietal cortex (series 5, image 29). There is also likely a third punctate focus of acute infarct involving left frontal cortex (series 5, image 30), although this cannot be confirmed on the ADC map and could be artifactual. Multiple foci of T2 hyperintensity within the subcortical and deep cerebral white matter are nonspecific but compatible with mild chronic small vessel ischemic disease. There is moderate generalized cerebral atrophy. There is no signal abnormality in the pons to correspond to the region of apparent low density on the CT, which was likely artifactual. 1.3 x 0.9 cm left parafalcine mass is seen in the parietal region corresponding to partially calcified lesion described on head CT. There is no intracranial hemorrhage, midline shift, or  extra-axial fluid collection. Major intracranial vascular flow voids are unremarkable. Prior left cataract surgery is noted. A small amount of left maxillary sinus fluid is present. There is a small left mastoid effusion.  MRA HEAD FINDINGS  The visualized distal vertebral arteries are patent. Left vertebral artery is slightly dominant, with the right vertebral artery being small distal to the PICA origin. PICA origins are patent bilaterally. AICA and SCA origins are also patent bilaterally. Basilar artery is patent without stenosis. The PCA origins and visualized branches are patent and unremarkable. A right posterior communicating artery is identified.  Internal carotid arteries are patent from skullbase to carotid terminus. ACA and MCA origins of the branch vessels are patent. Incidental note is made of a median artery of the corpus callosum. No intracranial aneurysm is identified.  IMPRESSION: 1. Three punctate foci of acute infarct involving the bilateral parietal lobes and left frontal lobe. 2. No evidence of pontine infarct. 3. Small left parafalcine extra-axial mass, likely a meningioma. 4. No evidence of major intracranial arterial occlusion or flow limiting stenosis.   Electronically Signed   By: Logan Bores   On: 05/04/2013 13:53   Mr Brain Wo Contrast  05/04/2013   CLINICAL DATA:  Fever and confusion. Possible pontine infarct on head CT.  EXAM: MRI HEAD WITHOUT CONTRAST  MRA HEAD WITHOUT CONTRAST  TECHNIQUE: Multiplanar, multiecho pulse sequences of the brain and surrounding structures were obtained without intravenous contrast. Angiographic images of the head were obtained using MRA technique without contrast.  COMPARISON:  Head CT 05/03/2013  FINDINGS: MRI HEAD FINDINGS  There is a punctate focus acute infarct involving medial  right parietal cortex (series 5, image 28). An additional punctate focus of acute infarct is noted involving the left parietal cortex (series 5, image 29). There is also  likely a third punctate focus of acute infarct involving left frontal cortex (series 5, image 30), although this cannot be confirmed on the ADC map and could be artifactual. Multiple foci of T2 hyperintensity within the subcortical and deep cerebral white matter are nonspecific but compatible with mild chronic small vessel ischemic disease. There is moderate generalized cerebral atrophy. There is no signal abnormality in the pons to correspond to the region of apparent low density on the CT, which was likely artifactual. 1.3 x 0.9 cm left parafalcine mass is seen in the parietal region corresponding to partially calcified lesion described on head CT. There is no intracranial hemorrhage, midline shift, or extra-axial fluid collection. Major intracranial vascular flow voids are unremarkable. Prior left cataract surgery is noted. A small amount of left maxillary sinus fluid is present. There is a small left mastoid effusion.  MRA HEAD FINDINGS  The visualized distal vertebral arteries are patent. Left vertebral artery is slightly dominant, with the right vertebral artery being small distal to the PICA origin. PICA origins are patent bilaterally. AICA and SCA origins are also patent bilaterally. Basilar artery is patent without stenosis. The PCA origins and visualized branches are patent and unremarkable. A right posterior communicating artery is identified.  Internal carotid arteries are patent from skullbase to carotid terminus. ACA and MCA origins of the branch vessels are patent. Incidental note is made of a median artery of the corpus callosum. No intracranial aneurysm is identified.  IMPRESSION: 1. Three punctate foci of acute infarct involving the bilateral parietal lobes and left frontal lobe. 2. No evidence of pontine infarct. 3. Small left parafalcine extra-axial mass, likely a meningioma. 4. No evidence of major intracranial arterial occlusion or flow limiting stenosis.   Electronically Signed   By: Logan Bores   On: 05/04/2013 13:53    Medications:  Scheduled: . antiseptic oral rinse  15 mL Mouth Rinse q12n4p  . aspirin EC  325 mg Oral Daily  . buPROPion  100 mg Oral Daily  . chlorhexidine  15 mL Mouth Rinse BID  . digoxin  0.125 mg Oral Daily  . feeding supplement (RESOURCE BREEZE)  1 Container Oral BID BM  . FLUoxetine  10 mg Oral Daily  . levofloxacin  500 mg Oral Daily  . levothyroxine  75 mcg Oral QAC breakfast  . multivitamin with minerals  1 tablet Oral Daily  . potassium chloride  40 mEq Oral Q4H  . Rivaroxaban  20 mg Oral QPC supper  . simvastatin  20 mg Oral q1800  . sodium chloride  3 mL Intravenous Q12H  . thiamine  100 mg Oral Daily  . vancomycin  125 mg Oral Q6H   Continuous:   JKD:[id number], acetaminophen, levalbuterol, methocarbamol, ondansetron (ZOFRAN) IV, ondansetron, oxyCODONE  Assessment/Plan:  Principal Problem:   HCAP (healthcare-associated pneumonia) Active Problems:   Chronic atrial flutter   Coronary artery disease   Essential hypertension   Acute encephalopathy   CVA (cerebrovascular accident)   C. difficile diarrhea   Protein-calorie malnutrition, severe    Healthcare associated pneumonia All cultures negative so far. Change to oral Levaquin. Influenza was negative.   C diff Diarrhea Seems to be improving. Abdomen is soft. Continue oral Vanc. Contact precautions.  Acute CVA He does not have any focal deficits at this time. MRI report reviewed. Neurology has  seen. ECHo and carotid reports reviewed. Seen by SLP and on Dys 3 diet. No further work up planned. Seen by PT. Home health.  A Flutter HR is better. Repeat EKG shows stable a flutter with PVC's. Continue Digoxin. Monitor on tele.  Acute encephalopathy Improved and seems to be at baseline. Most likely due to infection/dehydration. Daughter states he has been somewhat confused ever since he was started on Prozac and Welbutrin at SNF about 3 months ago. She requests he be taken  off of these medications. I explained to her that this has to be done gradually. Have decreased dose of Prozac.   On chronic anticoagulation with Xarelto for Atrial Flutter This will be continued.   History of hypothyroidism Continue with l-thyroxine.   Left femur fracture s/p repair in October He continues to be nonweightbearing on the left lower extremity. He has a followup with his orthopedic surgeon (Dr. Alvan Dame) sometime this month.   DVT Prophylaxis: He is on full anticoagulation  Code Status: Full code  Family Communication: Discussed with daughter 1/17 Disposition Plan: Anticipate discharge 1/19 with HH.    LOS: 3 days   [facility] Pager [phone] 05/06/2013, 10:44 AM  If 8PM-8AM, please contact night-coverage at [email or web address], password [facility]

## 2013-05-06 NOTE — Progress Notes (Addendum)
ANTIBIOTIC CONSULT NOTE - INITIAL  Pharmacy Consult for vancomycin and cefepime Indication: rule out pneumonia  No Known Allergies  Patient Measurements: Height: 6\' 3"  (190.5 cm) Weight: 239 lb 3.2 oz (108.5 kg) IBW/kg (Calculated) : 84.5   Vital Signs: Temp: 98.9 F (37.2 C) (01/18 0516) Temp src: Oral (01/18 0516) BP: 130/82 mmHg (01/18 0516) Pulse Rate: 106 (01/18 0516) Intake/Output from previous day: 01/17 0701 - 01/18 0700 In: 553 [I.V.:553] Out: 1075 [Urine:1075] Intake/Output from this shift: Total I/O In: 3 [I.V.:3] Out: -   Labs:  Recent Labs  05/04/13 0248 05/05/13 0223 05/06/13 0522  WBC 17.7* 11.5* 7.7  HGB 11.3* 11.5* 11.9*  PLT 192 184 204  CREATININE 0.59 0.61 0.54   Estimated Creatinine Clearance: 91.5 ml/min (by C-G formula based on Cr of 0.54). No results found for this basename: VANCOTROUGH, Corlis Leak, VANCORANDOM, [place], GENTPEAK, GENTRANDOM, TOBRATROUGH, TOBRAPEAK, TOBRARND, AMIKACINPEAK, AMIKACINTROU, AMIKACIN,  in the last 72 hours   Microbiology: Recent Results (from the past 720 hour(s))  CULTURE, BLOOD (ROUTINE X 2)     Status: None   Collection Time    05/03/13  3:05 PM      Result Value Range Status   Specimen Description BLOOD ARM RIGHT   Final   Special Requests BOTTLES DRAWN AEROBIC ONLY 5CC   Final   Culture  Setup Time     Final   Value: 05/03/2013 20:19     Performed at [facility]   Culture     Final   Value:        BLOOD CULTURE RECEIVED NO GROWTH TO DATE CULTURE WILL BE HELD FOR 5 DAYS BEFORE ISSUING A FINAL NEGATIVE REPORT     Performed at [facility]   Report Status PENDING   Incomplete  CULTURE, BLOOD (ROUTINE X 2)     Status: None   Collection Time    05/03/13  4:00 PM      Result Value Range Status   Specimen Description BLOOD LEFT FOREARM   Final   Special Requests BOTTLES DRAWN AEROBIC AND ANAEROBIC 5CC   Final   Culture  Setup Time     Final   Value: 05/03/2013 21:11     Performed at  [facility]   Culture     Final   Value:        BLOOD CULTURE RECEIVED NO GROWTH TO DATE CULTURE WILL BE HELD FOR 5 DAYS BEFORE ISSUING A FINAL NEGATIVE REPORT     Performed at [facility]   Report Status PENDING   Incomplete  CLOSTRIDIUM DIFFICILE BY PCR     Status: Abnormal   Collection Time    05/03/13  8:20 PM      Result Value Range Status   C difficile by pcr POSITIVE (*) NEGATIVE Final   Comment: CRITICAL RESULT CALLED TO, READ BACK BY AND VERIFIED WITH:     Tracie Harrier RN 12:40 05/04/13 ([email or web address])  MRSA PCR SCREENING     Status: None   Collection Time    05/04/13  7:08 AM      Result Value Range Status   MRSA by PCR NEGATIVE  NEGATIVE Final   Comment:            The GeneXpert MRSA Assay (FDA     approved for NASAL specimens     only), is one component of a     comprehensive MRSA colonization     surveillance program. It is not  intended to diagnose MRSA     infection nor to guide or     monitor treatment for     MRSA infections.    Medical History: Past Medical History  Diagnosis Date  . Hypertension   . Hyperlipidemia   . AAA (abdominal aortic aneurysm)   . Arthritis   . CAD (coronary artery disease)   . Thyroid disease     hypothyroidism  . Dysrhythmia     atrial flutter  . Shortness of breath     a little with exertion  . Polio 1934  . Atrial flutter     chronic anticoagulation  . COPD (chronic obstructive pulmonary disease)   . History of nuclear stress test 04/2010    dipyridamole; small, mostly fixed basal to mid inferior and inferoseptal defect (gut artifact v. scar); post stress EF 62%; non-diagnostic for ischemia; low risk     Medications:  Patient recently completed 10d course of vancomycin and zosyn  Assessment: 80 year old male from nursing home presents to [facility] with fever, weakness, and diarrhea. He has recently completed a course of broad antibiotics. Has been on vanc/cefepime. Pt now also has c.diff so de-escalate abx as  much as possible  Of note patient also takes xarelto for afib. CBC normal, no bleeding issues have been noted.   Goal of Therapy:  Vancomycin trough level 15-20 mcg/ml  Plan:   Cont vanc 1.25g IV q12 Cont cefepime 1g IV q12 Cont PO vanc 125mg  QID x14 days Cont xarelto 20mg  PO qday Consider de-escalate abx

## 2013-05-06 NOTE — Progress Notes (Signed)
Stroke Team Progress Note  HISTORY  Austin [name] is an 80 y.o. male with history of HTN, Hyperlipidemia, Aflutter on Xarelto who was recently hospitalized for a femur fracture and sent to a SNF. While in the SNF he developed PNA was treated with vancomycin and Zosyn. The course of antibiotics finished on January 13. HE went home to live with his daughter last Friday . Over last few days he was noted to be SOB and warm to touch. On morning of admission he was noted to be confused and tachycardic. HE was brought to the hospital. CXR showed infiltrates. MRI was obtained due to confusion. MRI findings are consistent with three punctate acute infarcts in bilateral parietal and left frontal lobes. No pontine infarct as suspected by CT. No evidence of intracranial stenosis noted on MRA. Patient denies missing any home doses of Xarelto and he has been continued on Xarelto while in hospital. Currently he is alert, oriented to date, year and place , able to follow all commands with no weakness noted.   Patient was not a TPA candidate secondary to delay in presentation.   SUBJECTIVE No family is at bedside. The patient indicates that he feels well, does have intermittent cough. No headache, weakness or numbness of the extremities.  OBJECTIVE Most recent Vital Signs: Filed Vitals:   05/05/13 1400 05/05/13 2230 05/06/13 0056 05/06/13 0516  BP: 125/76 112/63 157/69 130/82  Pulse: 84 69 72 106  Temp: 99.4 F (37.4 C) 99.1 F (37.3 C) 98.7 F (37.1 C) 98.9 F (37.2 C)  TempSrc: Oral Oral Oral Oral  Resp: 17 18 18 18   Height:      Weight:      SpO2: 97% 97% 95% 94%   CBG (last 3)  No results found for this basename: GLUCAP,  in the last 72 hours  IV Fluid Intake:   . sodium chloride 50 mL/hr at 05/05/13 2203    MEDICATIONS  . antiseptic oral rinse  15 mL Mouth Rinse q12n4p  . aspirin EC  325 mg Oral Daily  . buPROPion  100 mg Oral Daily  . ceFEPime (MAXIPIME) IV  1 g Intravenous Q12H  .  chlorhexidine  15 mL Mouth Rinse BID  . digoxin  0.125 mg Oral Daily  . feeding supplement (RESOURCE BREEZE)  1 Container Oral BID BM  . FLUoxetine  10 mg Oral Daily  . levothyroxine  75 mcg Oral QAC breakfast  . multivitamin with minerals  1 tablet Oral Daily  . Rivaroxaban  20 mg Oral QPC supper  . simvastatin  20 mg Oral q1800  . sodium chloride  3 mL Intravenous Q12H  . thiamine  100 mg Oral Daily  . vancomycin  1,250 mg Intravenous Q12H  . vancomycin  125 mg Oral QID   PRN:  acetaminophen, acetaminophen, levalbuterol, methocarbamol, ondansetron (ZOFRAN) IV, ondansetron, oxyCODONE  Diet:  Dysphagia 3, thin liquids Activity:   Up with assistance DVT Prophylaxis:  Xarelto  CLINICALLY SIGNIFICANT STUDIES Basic Metabolic Panel:   Recent Labs Lab 05/05/13 0223 05/06/13 0522  NA 137 136*  K 3.3* 3.2*  CL 101 99  CO2 25 24  GLUCOSE 91 89  BUN 8 5*  CREATININE 0.61 0.54  CALCIUM 8.4 8.2*   Liver Function Tests:   Recent Labs Lab 05/03/13 1319 05/04/13 0248  AST 20 15  ALT 17 12  ALKPHOS 94 130*  BILITOT 0.7 0.6  PROT 6.5 6.0  ALBUMIN 2.3* 2.1*   CBC:  Recent Labs  Lab 05/03/13 1319  05/05/13 0223 05/06/13 0522  WBC 18.7*  < > 11.5* 7.7  NEUTROABS 15.7*  --   --   --   HGB 12.3*  < > 11.5* 11.9*  HCT 36.7*  < > 35.0* 35.4*  MCV 87.8  < > 88.8 87.0  PLT 211  < > 184 204  < > = values in this interval not displayed. Coagulation:   Recent Labs Lab 05/03/13 1600  LABPROT 19.4*  INR 1.69*   Cardiac Enzymes:   Recent Labs Lab 05/03/13 1319  TROPONINI <0.30   Urinalysis:   Recent Labs Lab 05/03/13 1544  COLORURINE AMBER*  LABSPEC 1.023  PHURINE 5.0  GLUCOSEU NEGATIVE  HGBUR NEGATIVE  BILIRUBINUR NEGATIVE  KETONESUR NEGATIVE  PROTEINUR NEGATIVE  UROBILINOGEN 0.2  NITRITE NEGATIVE  LEUKOCYTESUR NEGATIVE   Lipid Panel    Component Value Date/Time   CHOL 84 05/05/2013 0215   TRIG 89 05/05/2013 0215   HDL 22* 05/05/2013 0215   CHOLHDL 3.8  05/05/2013 0215   VLDL 18 05/05/2013 0215   LDLCALC 44 05/05/2013 0215   HgbA1C  Lab Results  Component Value Date   HGBA1C 6.1* 05/04/2013    Urine Drug Screen:   No results found for this basename: labopia,  cocainscrnur,  labbenz,  amphetmu,  thcu,  labbarb    Alcohol Level: No results found for this basename: ETH,  in the last 168 hours  Mr [facility] Wo Contrast  05/04/2013   CLINICAL DATA:  Fever and confusion. Possible pontine infarct on head CT.  EXAM: MRI HEAD WITHOUT CONTRAST  MRA HEAD WITHOUT CONTRAST  TECHNIQUE: Multiplanar, multiecho pulse sequences of the brain and surrounding structures were obtained without intravenous contrast. Angiographic images of the head were obtained using MRA technique without contrast.  COMPARISON:  Head CT 05/03/2013  FINDINGS: MRI HEAD FINDINGS  There is a punctate focus acute infarct involving medial right parietal cortex (series 5, image 28). An additional punctate focus of acute infarct is noted involving the left parietal cortex (series 5, image 29). There is also likely a third punctate focus of acute infarct involving left frontal cortex (series 5, image 30), although this cannot be confirmed on the ADC map and could be artifactual. Multiple foci of T2 hyperintensity within the subcortical and deep cerebral white matter are nonspecific but compatible with mild chronic small vessel ischemic disease. There is moderate generalized cerebral atrophy. There is no signal abnormality in the pons to correspond to the region of apparent low density on the CT, which was likely artifactual. 1.3 x 0.9 cm left parafalcine mass is seen in the parietal region corresponding to partially calcified lesion described on head CT. There is no intracranial hemorrhage, midline shift, or extra-axial fluid collection. Major intracranial vascular flow voids are unremarkable. Prior left cataract surgery is noted. A small amount of left maxillary sinus fluid is present. There is a small  left mastoid effusion.  MRA HEAD FINDINGS  The visualized distal vertebral arteries are patent. Left vertebral artery is slightly dominant, with the right vertebral artery being small distal to the PICA origin. PICA origins are patent bilaterally. AICA and SCA origins are also patent bilaterally. Basilar artery is patent without stenosis. The PCA origins and visualized branches are patent and unremarkable. A right posterior communicating artery is identified.  Internal carotid arteries are patent from skullbase to carotid terminus. ACA and MCA origins of the branch vessels are patent. Incidental note is made of a median artery of  the corpus callosum. No intracranial aneurysm is identified.  IMPRESSION: 1. Three punctate foci of acute infarct involving the bilateral parietal lobes and left frontal lobe. 2. No evidence of pontine infarct. 3. Small left parafalcine extra-axial mass, likely a meningioma. 4. No evidence of major intracranial arterial occlusion or flow limiting stenosis.   Electronically Signed   By: Logan Bores   On: 05/04/2013 13:53   Mr Brain Wo Contrast  05/04/2013   CLINICAL DATA:  Fever and confusion. Possible pontine infarct on head CT.  EXAM: MRI HEAD WITHOUT CONTRAST  MRA HEAD WITHOUT CONTRAST  TECHNIQUE: Multiplanar, multiecho pulse sequences of the brain and surrounding structures were obtained without intravenous contrast. Angiographic images of the head were obtained using MRA technique without contrast.  COMPARISON:  Head CT 05/03/2013  FINDINGS: MRI HEAD FINDINGS  There is a punctate focus acute infarct involving medial right parietal cortex (series 5, image 28). An additional punctate focus of acute infarct is noted involving the left parietal cortex (series 5, image 29). There is also likely a third punctate focus of acute infarct involving left frontal cortex (series 5, image 30), although this cannot be confirmed on the ADC map and could be artifactual. Multiple foci of T2  hyperintensity within the subcortical and deep cerebral white matter are nonspecific but compatible with mild chronic small vessel ischemic disease. There is moderate generalized cerebral atrophy. There is no signal abnormality in the pons to correspond to the region of apparent low density on the CT, which was likely artifactual. 1.3 x 0.9 cm left parafalcine mass is seen in the parietal region corresponding to partially calcified lesion described on head CT. There is no intracranial hemorrhage, midline shift, or extra-axial fluid collection. Major intracranial vascular flow voids are unremarkable. Prior left cataract surgery is noted. A small amount of left maxillary sinus fluid is present. There is a small left mastoid effusion.  MRA HEAD FINDINGS  The visualized distal vertebral arteries are patent. Left vertebral artery is slightly dominant, with the right vertebral artery being small distal to the PICA origin. PICA origins are patent bilaterally. AICA and SCA origins are also patent bilaterally. Basilar artery is patent without stenosis. The PCA origins and visualized branches are patent and unremarkable. A right posterior communicating artery is identified.  Internal carotid arteries are patent from skullbase to carotid terminus. ACA and MCA origins of the branch vessels are patent. Incidental note is made of a median artery of the corpus callosum. No intracranial aneurysm is identified.  IMPRESSION: 1. Three punctate foci of acute infarct involving the bilateral parietal lobes and left frontal lobe. 2. No evidence of pontine infarct. 3. Small left parafalcine extra-axial mass, likely a meningioma. 4. No evidence of major intracranial arterial occlusion or flow limiting stenosis.   Electronically Signed   By: Logan Bores   On: 05/04/2013 13:53    CT of the brain   IMPRESSION:  Suspect recent infarct in the left pons.  Atrophy with periventricular small vessel disease.  Small partially calcified  meningioma posterior left parafalcine  region. No associated edema or mass effect.  Left maxillary sinus disease.  MRI of the brain   IMPRESSION:  1. Three punctate foci of acute infarct involving the bilateral  parietal lobes and left frontal lobe.  2. No evidence of pontine infarct.  3. Small left parafalcine extra-axial mass, likely a meningioma.  4. No evidence of major intracranial arterial occlusion or flow  limiting stenosis.  MRA of the brain  See above 2D Echocardiogram   Study Conclusions  - Left ventricle: The cavity size was normal. Wall thickness was normal. Systolic function was normal. The estimated ejection fraction was in the range of 55% to 60%. Wall motion was normal; there were no regional wall motion abnormalities. - Aortic valve: There was mild stenosis. Valve area: 1.1cm^2(VTI). Valve area: 1.11cm^2 (Vmax). - Right ventricle: The cavity size was moderately dilated. - Pulmonary arteries: Systolic pressure was mildly increased. PA peak pressure: 25mm Hg (S).   Carotid Doppler   Carotid duplex completed.  Preliminary report: Bilateral: 1-39% ICA stenosis. Vertebral artery flow is antegrade.   CXR   IMPRESSION:  New linear opacity seen in left midlung concerning for subsegmental  atelectasis or possibly pneumonia. Followup radiographs are  recommended.   EKG   Sinus tachycardia Multiform ventricular premature complexes Nonspecific intraventricular conduction delay Borderline repolarization abnormality  Therapy Recommendations HH PT  Physical Exam  General: The patient is alert and cooperative at the time of the examination.  Skin: 1 to 2 plus edema in the legs below the knees   Neurologic Exam  Mental status: The patient is oriented x 3.  Cranial nerves: Facial symmetry is present. Speech is dysphonic, not aphasic. Extraocular movements are full. Visual fields are full.  Motor: The patient has good strength in all 4  extremities.  Sensory examination: Soft touch sensation is symmetric on the face, arms, and legs.  Coordination: The patient has good finger-nose-finger and heel-to-shin bilaterally.  Gait and station: The gait was not tested  Reflexes: Deep tendon reflexes are symmetric.    ASSESSMENT [name] is a 80 y.o. male presenting with confusion, pneumonia.TPA was not administered secondary to minimal deficit. MRI the brain has shown punctate infarcts posteriorly, and in the left frontal area. Infarct suggest an embolic etiology. The patient is on Xarelto prior to admission.     Atrial flutter, on Xarelto  Hypertension  Pneumonia  Abdominal aortic aneurysm  Dyslipidemia  Coronary artery disease  COPD  Hospital day # 3  TREATMENT/PLAN  Continue Xarelto for now    the patient is at baseline, likely does not need therapy ongoing  Mobilize patient  Physical therapy eval- HH PT  Stroke work up is complete, will S/O  [employer] KEITH  05/06/2013 9:05 AM

## 2013-05-07 LAB — BASIC METABOLIC PANEL
BUN: 3 mg/dL — ABNORMAL LOW (ref 6–23)
CHLORIDE: 99 meq/L (ref 96–112)
CO2: 23 mEq/L (ref 19–32)
Calcium: 8.4 mg/dL (ref 8.4–10.5)
Creatinine, Ser: 0.53 mg/dL (ref 0.50–1.35)
GFR calc Af Amer: >90 mL/min (ref >90)
GLUCOSE: 98 mg/dL (ref 70–99)
Potassium: 3.5 mEq/L — ABNORMAL LOW (ref 3.7–5.3)
SODIUM: 136 meq/L — AB (ref 137–147)

## 2013-05-07 MED ORDER — FLUOXETINE HCL 10 MG PO CAPS: 10.0000 mg | ORAL_CAPSULE | Freq: Every day | ORAL | Status: DC

## 2013-05-07 MED ORDER — VANCOMYCIN HCL 125 MG PO CAPS: 125.0000 mg | ORAL_CAPSULE | Freq: Four times a day (QID) | ORAL | Status: DC

## 2013-05-07 MED ORDER — LEVOFLOXACIN 500 MG PO TABS: 500.0000 mg | ORAL_TABLET | Freq: Every day | ORAL | Status: DC

## 2013-05-07 MED ORDER — POTASSIUM CHLORIDE CRYS ER 20 MEQ PO TBCR
40.0000 meq | EXTENDED_RELEASE_TABLET | Freq: Once | ORAL | Status: AC
  Administered 2013-05-07: 40 meq via ORAL
  Filled 2013-05-07: qty 2

## 2013-05-07 NOTE — Discharge Instructions (Signed)
STROKE/TIA DISCHARGE INSTRUCTIONS SMOKING Cigarette smoking nearly doubles your risk of having a stroke & is the single most alterable risk factor  If you smoke or have smoked in the last 12 months, you are advised to quit smoking for your health.  Most of the excess cardiovascular risk related to smoking disappears within a year of stopping.  Ask you doctor about anti-smoking medications  [place] Quit Line: 1-800-QUIT NOW  Free Smoking Cessation Classes [phone]  CHOLESTEROL Know your levels; limit fat & cholesterol in your diet  Lipid Panel     Component Value Date/Time   CHOL 84 05/05/2013 0215   TRIG 89 05/05/2013 0215   HDL 22* 05/05/2013 0215   CHOLHDL 3.8 05/05/2013 0215   VLDL 18 05/05/2013 0215   LDLCALC 44 05/05/2013 0215      Many patients benefit from treatment even if their cholesterol is at goal.  Goal: Total Cholesterol (CHOL) less than 160  Goal:  Triglycerides (TRIG) less than 150  Goal:  HDL greater than 40  Goal:  LDL (LDLCALC) less than 100   BLOOD PRESSURE American Stroke Association blood pressure target is less that 120/80 mm/Hg  Your discharge blood pressure is:  BP: 148/79 mmHg  Monitor your blood pressure  Limit your salt and alcohol intake  Many individuals will require more than one medication for high blood pressure  DIABETES (A1c is a blood sugar average for last 3 months) Goal HGBA1c is under 7% (HBGA1c is blood sugar average for last 3 months)  Diabetes: No known diagnosis of diabetes    Lab Results  Component Value Date   HGBA1C 6.1* 05/04/2013     Your HGBA1c can be lowered with medications, healthy diet, and exercise.  Check your blood sugar as directed by your physician  Call your physician if you experience unexplained or low blood sugars.  PHYSICAL ACTIVITY/REHABILITATION Goal is 30 minutes at least 4 days per week  Activity: Increase activity slowly, Therapies: Physical Therapy: Home Health and Occupational Therapy: Home  Health Return to work: NA  Activity decreases your risk of heart attack and stroke and makes your heart stronger.  It helps control your weight and blood pressure; helps you relax and can improve your mood.  Participate in a regular exercise program.  Talk with your doctor about the best form of exercise for you (dancing, walking, swimming, cycling).  DIET/WEIGHT Goal is to maintain a healthy weight  Your discharge diet is: Dysphagia thin liquids Your height is:  Height: 6\' 3"  (190.5 cm) Your current weight is: Weight: 108.5 kg (239 lb 3.2 oz) Your Body Mass Index (BMI) is:  BMI (Calculated): 30  Following the type of diet specifically designed for you will help prevent another stroke.  Your goal weight range is:  152-192  Your goal Body Mass Index (BMI) is 19-24.  Healthy food habits can help reduce 3 risk factors for stroke:  High cholesterol, hypertension, and excess weight.  RESOURCES Stroke/Support Group:  Call [phone]   STROKE EDUCATION PROVIDED/REVIEWED AND GIVEN TO PATIENT Stroke warning signs and symptoms How to activate emergency medical system (call 911). Medications prescribed at discharge. Need for follow-up after discharge. Personal risk factors for stroke. Pneumonia vaccine given: Uptodate. Not given this hosp. Flu vaccine given: Yes 1/17 My questions have been answered, the writing is legible, and I understand these instructions.  I will adhere to these goals & educational materials that have been provided to me after my discharge from the hospital.  Clostridium Difficile Infection Clostridium difficile (C. difficile) is a bacteria found in the intestinal tract or colon. Under certain conditions, it causes diarrhea and sometimes severe disease. The severe form of the disease is known as pseudomembranous colitis (often called C. difficile colitis). This disease can damage the lining of the colon or cause the colon to become enlarged (toxic megacolon).  CAUSES   Your colon normally contains many different bacteria, including C. difficile. The balance of bacteria in your colon can change during illness. This is especially true when you take antibiotic medicine. Taking antibiotics may allow the C. difficile to grow, multiply excessively, and make a toxin that then causes illness. The elderly and people with certain medical conditions have a greater risk of getting C. difficile infections. SYMPTOMS   Watery diarrhea.  Fever.  Fatigue.  Loss of appetite.  Nausea.  Abdominal swelling, pain, or tenderness.  Dehydration. DIAGNOSIS  Your symptoms may make your caregiver suspicious of a C. difficile infection, especially if you have used antibiotics in the preceding weeks. However, there are only 2 ways to know for certain whether you have a C. difficile infection:  A lab test that finds the toxin in your stool.  The specific appearance of an abnormality (pseudomembrane) in your colon. This can only be seen by doing a sigmoidoscopy or colonoscopy. These procedures involve passing an instrument through your rectum to look at the inside of your colon. Your caregiver will help determine if these tests are necessary. TREATMENT   Most people are successfully treated with one of two specific antibiotics, usually given by mouth. Other antibiotics you are receiving are stopped if possible.  Intravenous (IV) fluids and correction of electrolyte imbalance may be necessary.  Rarely, surgery may be needed to remove the infected part of the intestines.  Careful hand washing by you and your caregivers is important to prevent the spread of infection. In the hospital, your caregivers may also put on gowns and gloves to prevent the spread of the C. difficile bacteria. Your room is also cleaned regularly with a solution containing bleach or a product that is known to kill C. difficile. HOME CARE INSTRUCTIONS  Drink enough fluids to keep your urine clear or pale  yellow. Avoid milk, caffeine, and alcohol.  Ask your caregiver for specific rehydration instructions.  Try eating small, frequent meals rather than large meals.  Take your antibiotics as directed. Finish them even if you start to feel better.  Do not use medicines to slow diarrhea. This could delay healing or cause complications.  Wash your hands thoroughly after using the bathroom and before preparing food.  Make sure people who live with you wash their hands often, too.  Carefully disinfect all surfaces with a product that contains chlorine bleach. SEEK MEDICAL CARE IF:  Diarrhea persists longer than expected or recurs after completing your course of antibiotic treatment for the C. difficile infection.  You have trouble staying hydrated. SEEK IMMEDIATE MEDICAL CARE IF:  You develop a new fever.  You have increasing abdominal pain or tenderness.  There is blood in your stools, or your stools are dark black and tarry.  You cannot hold down food or liquids. MAKE SURE YOU:   Understand these instructions.  Will watch your condition.  Will get help right away if you are not doing well or get worse. Document Released: 01/13/2005 Document Revised: 07/31/2012 Document Reviewed: 09/11/2010 [facility] Patient Information 2014 [place], [place].   Home Health Services to be Provided by: [facility]  Health Services: [phone] Registered Nurse, Physical and Occupational Therapies.

## 2013-05-07 NOTE — Care Management Note (Signed)
    Page 1 of 2   05/07/2013     10:43:04 AM   CARE MANAGEMENT NOTE 05/07/2013  Patient:  Austin [name], Austin [name]   Account Number:  [id number]  Date Initiated:  05/04/2013  Documentation initiated by:  Marvetta Gibbons  Subjective/Objective Assessment:   Pt admitted with HCAP     Action/Plan:   PTA pt was at home- recently d/c'd from SNF-rehab and was living with his daugher- NCM to follow for d/c needs   Anticipated DC Date:  05/08/2013   Anticipated DC Plan:        DC Planning Services  CM consult      [facility] Choice  Resumption Of Svcs/PTA Provider   Choice offered to / List presented to:  C-4 Adult Children        [place] arranged  HH-1 RN  HH-10 DISEASE MANAGEMENT  HH-2 PT  HH-3 OT      [facility] agency  [employer]   Status of service:  Completed, signed off Medicare Important Message given?   (If response is "NO", the following Medicare IM given date fields will be blank) Date Medicare IM given:   Date Additional Medicare IM given:    Discharge Disposition:  [place]  Per UR Regulation:  Reviewed for med. necessity/level of care/duration of stay  If discussed at [place] of Stay Meetings, dates discussed:    Comments:  05-07-13 1041 Jacqlyn Krauss, RN,BSN [phone] CM did speak to pt's daughter and they would like [facility]. CM did call and make referral and SOC to begin within 24-48 hrs post d/c. No further needs from CM at this time.    05/05/13 15:33 CM notified of probable disharge of pt 05/06/13 and will resume HH with [place].  CM will notify [facility] when orders are placed.  "Sticky" note for actual orders placed.  CM wil continue to follow for disposition needs.  Mariane Masters, BSN, CM [phone].

## 2013-05-07 NOTE — Discharge Summary (Signed)
[facility]  Physician Discharge Summary   Patient ID: CHANZ CAHALL MRN: [id number] DOB/AGE: December 10, 1926 80 y.o.  Admit date: 05/03/2013 Discharge date: 05/07/2013  PCP: [name], MD  DISCHARGE DIAGNOSES:  Principal Problem:   HCAP (healthcare-associated pneumonia) Active Problems:   Chronic atrial flutter   Coronary artery disease   Essential hypertension   CVA (cerebrovascular accident)   C. difficile diarrhea   Protein-calorie malnutrition, severe   RECOMMENDATIONS FOR OUTPATIENT FOLLOW UP: 1. Patient being set up with home health.  2. Daughter requesting that he be weaned off of one or both antidepressants. Have decreased dose of Prozac. Please follow up.  DISCHARGE CONDITION: fair  Diet recommendation: Dysphagia 3 diet with thin liquids  Filed Weights   05/03/13 1500 05/03/13 2020  Weight: 130 kg (286 lb 9.6 oz) 108.5 kg (239 lb 3.2 oz)    INITIAL HISTORY: CAL GINDLESPERGER is a 80 y.o. male with a past medical history of atrial flutter, hypertension, hospitalization in October for femur fracture. Patient was discharged from the hospital at that time to a skilled nursing facility for rehabilitation. Towards the end of stay in the skilled nursing facility patient developed a pneumonia. It looks like he was treated with vancomycin and Zosyn. The course of antibiotics finished on January 13. He came home to live with his daughter on 1/9. His cough had improved. However, in the last few days prior to admission the family noted that the patient has been coughing more. He was also confused. So he was brought in to the hospital. He was found to CXR suspicious for infiltrate. There was also a history of diarrhea at home.  Consultations:  Neurology  Procedures: 2D ECHO 1/16  Study Conclusions - Left ventricle: The cavity size was normal. Wall thickness was normal. Systolic function was normal. The estimated ejection fraction was in the range of 55% to  60%. Wall motion was normal; there were no regional wall motion abnormalities. - Aortic valve: There was mild stenosis. Valve area: 1.1cm^2(VTI). Valve area: 1.11cm^2 (Vmax). - Right ventricle: The cavity size was moderately dilated. - Pulmonary arteries: Systolic pressure was mildly increased. PA peak pressure: 69mm Hg (S).  Carotid Dopplers 1/17  Bilateral: 1-39% ICA stenosis. Vertebral artery flow is antegrade.   HOSPITAL COURSE:   Healthcare associated pneumonia  He was started on broad spectrum coverage. All cultures negative so far. He was then change to oral Levaquin. Influenza was negative. Patient respiratory status is stable and improved. He is saturating well on room air.  C diff Diarrhea  This seems to be improving. Due to severity of illness at presentation he was started on oral Vanc. His diarrhea is much better. Abdomen is non tender.   Acute CVA  He does not have any focal deficits at this time. MRI report reviewed. Neurology has seen. ECHO and carotid reports reviewed. Seen by SLP and on Dys 3 diet. No further work up planned. Seen by PT. Home health to be arranged. Continue Xarelto and statin.   Chronic A Flutter  He was initially tachycardic on presentation. This was likely due to sepsis. With fluids this has improved. Repeat EKG shows stable a flutter with PVC's. Continue Digoxin.   Acute encephalopathy  Likely due to sepsis and perhaps the acute stroke. Improved and seems to be at baseline. Daughter had also stated he has been somewhat confused ever since he was started on Prozac and Welbutrin at SNF about 3 months ago. She requested he be taken off  of these medications. I explained to her that this has to be done gradually. Have decreased dose of Prozac to 10mg  daily. PCP to further address.  On chronic anticoagulation with Xarelto for Atrial Flutter  This will be continued.   History of hypothyroidism  Continue with l-thyroxine.   Left femur fracture s/p repair  in October  He continues to be nonweightbearing on the left lower extremity. He has a followup with his orthopedic surgeon (Dr. Alvan Dame) sometime this month.   Overall patient remains stable. He is improved and ok for discharge. He and daughter agree with plans to go home.   PERTINENT LABS:  The results of significant diagnostics from this hospitalization (including imaging, microbiology, ancillary and laboratory) are listed below for reference.    Microbiology: Recent Results (from the past 240 hour(s))  CULTURE, BLOOD (ROUTINE X 2)     Status: None   Collection Time    05/03/13  3:05 PM      Result Value Range Status   Specimen Description BLOOD ARM RIGHT   Final   Special Requests BOTTLES DRAWN AEROBIC ONLY 5CC   Final   Culture  Setup Time     Final   Value: 05/03/2013 20:19     Performed at [facility]   Culture     Final   Value:        BLOOD CULTURE RECEIVED NO GROWTH TO DATE CULTURE WILL BE HELD FOR 5 DAYS BEFORE ISSUING A FINAL NEGATIVE REPORT     Performed at [facility]   Report Status PENDING   Incomplete  CULTURE, BLOOD (ROUTINE X 2)     Status: None   Collection Time    05/03/13  4:00 PM      Result Value Range Status   Specimen Description BLOOD LEFT FOREARM   Final   Special Requests BOTTLES DRAWN AEROBIC AND ANAEROBIC 5CC   Final   Culture  Setup Time     Final   Value: 05/03/2013 21:11     Performed at [facility]   Culture     Final   Value:        BLOOD CULTURE RECEIVED NO GROWTH TO DATE CULTURE WILL BE HELD FOR 5 DAYS BEFORE ISSUING A FINAL NEGATIVE REPORT     Performed at [facility]   Report Status PENDING   Incomplete  CLOSTRIDIUM DIFFICILE BY PCR     Status: Abnormal   Collection Time    05/03/13  8:20 PM      Result Value Range Status   C difficile by pcr POSITIVE (*) NEGATIVE Final   Comment: CRITICAL RESULT CALLED TO, READ BACK BY AND VERIFIED WITH:     Tracie Harrier RN 12:40 05/04/13 ([email or web address])  MRSA PCR SCREENING      Status: None   Collection Time    05/04/13  7:08 AM      Result Value Range Status   MRSA by PCR NEGATIVE  NEGATIVE Final   Comment:            The GeneXpert MRSA Assay (FDA     approved for NASAL specimens     only), is one component of a     comprehensive MRSA colonization     surveillance program. It is not     intended to diagnose MRSA     infection nor to guide or     monitor treatment for     MRSA infections.  Labs: Basic Metabolic Panel:  Recent Labs Lab 05/03/13 1319 05/04/13 0248 05/05/13 0223 05/06/13 0522 05/07/13 0410  NA 134* 135* 137 136* 136*  K 3.4* 3.6* 3.3* 3.2* 3.5*  CL 97 101 101 99 99  CO2 24 23 25 24 23   GLUCOSE 108* 88 91 89 98  BUN 9 8 8  5* 3*  CREATININE 0.67 0.59 0.61 0.54 0.53  CALCIUM 8.7 8.4 8.4 8.2* 8.4   Liver Function Tests:  Recent Labs Lab 05/03/13 1319 05/04/13 0248  AST 20 15  ALT 17 12  ALKPHOS 94 130*  BILITOT 0.7 0.6  PROT 6.5 6.0  ALBUMIN 2.3* 2.1*    Recent Labs Lab 05/03/13 1319  LIPASE 12   CBC:  Recent Labs Lab 05/03/13 1319 05/04/13 0248 05/05/13 0223 05/06/13 0522  WBC 18.7* 17.7* 11.5* 7.7  NEUTROABS 15.7*  --   --   --   HGB 12.3* 11.3* 11.5* 11.9*  HCT 36.7* 33.7* 35.0* 35.4*  MCV 87.8 88.2 88.8 87.0  PLT 211 192 184 204   Cardiac Enzymes:  Recent Labs Lab 05/03/13 1319  TROPONINI <0.30   BNP: BNP (last 3 results)  Recent Labs  05/03/13 1319  PROBNP 622.4*    IMAGING STUDIES Ct Head Wo Contrast  05/03/2013   CLINICAL DATA:  Fever and confusion  EXAM: CT HEAD WITHOUT CONTRAST  TECHNIQUE: Contiguous axial images were obtained from the base of the skull through the vertex without intravenous contrast. Study was obtained within 24 hr of patient's arrival at the emergency department.  COMPARISON:  None.  FINDINGS: There is mild diffuse atrophy. There is a small focus of increased attenuation in the posterior left parafalcine region 3 measuring 1.4 x 1.1 cm, felt 1 to represent a  partially calcified meningioma. There is no surrounding edema in this area. There is no other evidence of mass. There is no appreciable acute hemorrhage. There is no extra-axial fluid or midline shift.  There is patchy small vessel disease in the centra semiovale bilaterally. There is decreased attenuation throughout the left pons compared to the right, concerning for recent infarct. No other findings felt to represent potential acute infarct identified.  The bony calvarium appears intact. The mastoid air cells are clear. There is mucosal thickening in the left maxillary antrum.  IMPRESSION: Suspect recent infarct in the left pons.  Atrophy with periventricular small vessel disease.  Small partially calcified meningioma posterior left parafalcine region. No associated edema or mass effect.  Left maxillary sinus disease.   Electronically Signed   By: Lowella Grip M.D.   On: 05/03/2013 13:54   Mr Jodene Nam Head Wo Contrast  05/04/2013   CLINICAL DATA:  Fever and confusion. Possible pontine infarct on head CT.  EXAM: MRI HEAD WITHOUT CONTRAST  MRA HEAD WITHOUT CONTRAST  TECHNIQUE: Multiplanar, multiecho pulse sequences of the brain and surrounding structures were obtained without intravenous contrast. Angiographic images of the head were obtained using MRA technique without contrast.  COMPARISON:  Head CT 05/03/2013  FINDINGS: MRI HEAD FINDINGS  There is a punctate focus acute infarct involving medial right parietal cortex (series 5, image 28). An additional punctate focus of acute infarct is noted involving the left parietal cortex (series 5, image 29). There is also likely a third punctate focus of acute infarct involving left frontal cortex (series 5, image 30), although this cannot be confirmed on the ADC map and could be artifactual. Multiple foci of T2 hyperintensity within the subcortical and deep cerebral white matter are  nonspecific but compatible with mild chronic small vessel ischemic disease. There is  moderate generalized cerebral atrophy. There is no signal abnormality in the pons to correspond to the region of apparent low density on the CT, which was likely artifactual. 1.3 x 0.9 cm left parafalcine mass is seen in the parietal region corresponding to partially calcified lesion described on head CT. There is no intracranial hemorrhage, midline shift, or extra-axial fluid collection. Major intracranial vascular flow voids are unremarkable. Prior left cataract surgery is noted. A small amount of left maxillary sinus fluid is present. There is a small left mastoid effusion.  MRA HEAD FINDINGS  The visualized distal vertebral arteries are patent. Left vertebral artery is slightly dominant, with the right vertebral artery being small distal to the PICA origin. PICA origins are patent bilaterally. AICA and SCA origins are also patent bilaterally. Basilar artery is patent without stenosis. The PCA origins and visualized branches are patent and unremarkable. A right posterior communicating artery is identified.  Internal carotid arteries are patent from skullbase to carotid terminus. ACA and MCA origins of the branch vessels are patent. Incidental note is made of a median artery of the corpus callosum. No intracranial aneurysm is identified.  IMPRESSION: 1. Three punctate foci of acute infarct involving the bilateral parietal lobes and left frontal lobe. 2. No evidence of pontine infarct. 3. Small left parafalcine extra-axial mass, likely a meningioma. 4. No evidence of major intracranial arterial occlusion or flow limiting stenosis.   Electronically Signed   By: Logan Bores   On: 05/04/2013 13:53   Mr Brain Wo Contrast  05/04/2013   CLINICAL DATA:  Fever and confusion. Possible pontine infarct on head CT.  EXAM: MRI HEAD WITHOUT CONTRAST  MRA HEAD WITHOUT CONTRAST  TECHNIQUE: Multiplanar, multiecho pulse sequences of the brain and surrounding structures were obtained without intravenous contrast. Angiographic  images of the head were obtained using MRA technique without contrast.  COMPARISON:  Head CT 05/03/2013  FINDINGS: MRI HEAD FINDINGS  There is a punctate focus acute infarct involving medial right parietal cortex (series 5, image 28). An additional punctate focus of acute infarct is noted involving the left parietal cortex (series 5, image 29). There is also likely a third punctate focus of acute infarct involving left frontal cortex (series 5, image 30), although this cannot be confirmed on the ADC map and could be artifactual. Multiple foci of T2 hyperintensity within the subcortical and deep cerebral white matter are nonspecific but compatible with mild chronic small vessel ischemic disease. There is moderate generalized cerebral atrophy. There is no signal abnormality in the pons to correspond to the region of apparent low density on the CT, which was likely artifactual. 1.3 x 0.9 cm left parafalcine mass is seen in the parietal region corresponding to partially calcified lesion described on head CT. There is no intracranial hemorrhage, midline shift, or extra-axial fluid collection. Major intracranial vascular flow voids are unremarkable. Prior left cataract surgery is noted. A small amount of left maxillary sinus fluid is present. There is a small left mastoid effusion.  MRA HEAD FINDINGS  The visualized distal vertebral arteries are patent. Left vertebral artery is slightly dominant, with the right vertebral artery being small distal to the PICA origin. PICA origins are patent bilaterally. AICA and SCA origins are also patent bilaterally. Basilar artery is patent without stenosis. The PCA origins and visualized branches are patent and unremarkable. A right posterior communicating artery is identified.  Internal carotid arteries are patent from skullbase  to carotid terminus. ACA and MCA origins of the branch vessels are patent. Incidental note is made of a median artery of the corpus callosum. No intracranial  aneurysm is identified.  IMPRESSION: 1. Three punctate foci of acute infarct involving the bilateral parietal lobes and left frontal lobe. 2. No evidence of pontine infarct. 3. Small left parafalcine extra-axial mass, likely a meningioma. 4. No evidence of major intracranial arterial occlusion or flow limiting stenosis.   Electronically Signed   By: Logan Bores   On: 05/04/2013 13:53   Dg Chest Port 1 View  05/03/2013   CLINICAL DATA:  Fever.  EXAM: PORTABLE CHEST - 1 VIEW  COMPARISON:  January 26, 2013.  FINDINGS: Stable cardiomediastinal silhouette. New linear density seen in left perihilar region concerning for subsegmental listhesis or possibly early pneumonia. No pleural effusion or pneumothorax is noted. Stable bilateral interstitial opacities are noted most consistent with scarring.  IMPRESSION: New linear opacity seen in left midlung concerning for subsegmental atelectasis or possibly pneumonia. Followup radiographs are recommended.   Electronically Signed   By: Sabino Dick M.D.   On: 05/03/2013 13:44    DISCHARGE EXAMINATION: Filed Vitals:   05/06/13 0516 05/06/13 1422 05/06/13 2124 05/07/13 0637  BP: 130/82 115/67 136/78 148/79  Pulse: 106 79 85 66  Temp: 98.9 F (37.2 C)  98.2 F (36.8 C) 98.1 F (36.7 C)  TempSrc: Oral Oral Oral Oral  Resp: 18 18 18 20   Height:      Weight:      SpO2: 94% 95% 93% 92%   General appearance: alert, cooperative, appears stated age and no distress Resp: clear to auscultation bilaterally Cardio: regular rate and rhythm, S1, S2 normal, no murmur, click, rub or gallop GI: soft, non-tender; bowel sounds normal; no masses,  no organomegaly Neurologic: No focal deficits  DISPOSITION: Home with daughter. Home health  Discharge Orders   Future Appointments Provider Department Dept Phone   10/28/2014 8:30 AM [facility] [phone]   Eat a light meal the night before the exam but please avoid gaseous foods.    Nothing to eat or drink for at least 8 hours prior to the exam. No gum chewing or smoking the morning of the exam. Please take your morning medications with small sips of water, especially blood pressure medication. If you have several vascular lab exams and will see physician, please bring a snack with you.   10/28/2014 9:00 AM Serafina Mitchell, MD [facility] [phone]   Future Orders Complete By Expires   Discharge diet:  As directed    Comments:     Dysphagia 3 diet with thin liquids   Discharge instructions  As directed    Comments:     Please follow up with your PCP by end of week or early next week. Seek attention if you develop difficulty breathing or profuse diarrhea. The diarrhea will take at least 3-4 more days to resolve completely.   Increase activity slowly  As directed       ALLERGIES: No Known Allergies  Current Discharge Medication List    START taking these medications   Details  levofloxacin (LEVAQUIN) 500 MG tablet Take 1 tablet (500 mg total) by mouth daily. For 6 more days Qty: 6 tablet, Refills: 0    vancomycin (VANCOCIN) 125 MG capsule Take 1 capsule (125 mg total) by mouth 4 (four) times daily. For 12 more days Qty: 48 capsule, Refills: 0  CONTINUE these medications which have CHANGED   Details  FLUoxetine (PROZAC) 10 MG capsule Take 1 capsule (10 mg total) by mouth daily. Qty: 30 capsule, Refills: 0      CONTINUE these medications which have NOT CHANGED   Details  albuterol (PROVENTIL) (2.5 MG/3ML) 0.083% nebulizer solution Take 2.5 mg by nebulization every 6 (six) hours as needed for wheezing or shortness of breath.    buPROPion (WELLBUTRIN SR) 100 MG 12 hr tablet Take 100 mg by mouth daily.    cholecalciferol (VITAMIN D) 1000 UNITS tablet Take 1,000 Units by mouth daily.    dextromethorphan 15 MG/5ML syrup Take 10 mLs by mouth 3 (three) times daily as needed for cough.    digoxin (LANOXIN) 0.125 MG tablet Take 1  tablet (0.125 mg total) by mouth daily.    feeding supplement, ENSURE COMPLETE, (ENSURE COMPLETE) LIQD Take 237 mLs by mouth 2 (two) times daily between meals.    ferrous sulfate 325 (65 FE) MG tablet Take 325 mg by mouth daily with breakfast.    levothyroxine (SYNTHROID, LEVOTHROID) 75 MCG tablet Take 75 mcg by mouth daily before breakfast.    lovastatin (MEVACOR) 40 MG tablet Take 40 mg by mouth at bedtime.    methocarbamol (ROBAXIN) 500 MG tablet Take 1 tablet (500 mg total) by mouth every 6 (six) hours as needed (muscle spasms). Qty: 30 tablet, Refills: 0    Multiple Vitamin (MULTIVITAMIN WITH MINERALS) TABS tablet Take 1 tablet by mouth daily.    pantoprazole (PROTONIX) 40 MG tablet Take 1 tablet (40 mg total) by mouth daily.    potassium chloride SA (K-DUR,KLOR-CON) 20 MEQ tablet Take 20 mEq by mouth daily.    Rivaroxaban (XARELTO) 20 MG TABS Take 20 mg by mouth daily.    trolamine salicylate (ASPERCREME) 10 % cream Apply 1 application topically as needed (for muscle pain).      STOP taking these medications     Piperacillin Sod-Tazobactam So (ZOSYN IV)      vancomycin (VANCOCIN) 1000 MG injection        Follow-up Information   Follow up with [facility]. (home health services)    Contact information:   [place] [address] [phone]       Follow up with [facility], MD. Schedule an appointment as soon as possible for a visit in 1 week.   Specialty:  Family Medicine   Contact information:   Dr. Daiva Eves [address] [place] [address] [phone]       Follow up with Mauri Pole, MD. (as scheduled)    Specialty:  Orthopedic Surgery   Contact information:   [address] [place] [address] [place] [address] [phone]       TOTAL DISCHARGE TIME: 35 mins  [facility] Pager [phone]  05/07/2013, 9:27 AM

## 2013-05-09 LAB — CULTURE, BLOOD (ROUTINE X 2)
CULTURE: NO GROWTH
Culture: NO GROWTH

## 2013-05-12 ENCOUNTER — Inpatient Hospital Stay (HOSPITAL_COMMUNITY)
Admission: EM | Admit: 2013-05-12 | Discharge: 2013-05-16 | DRG: 871 | Disposition: A | Discharge status: Home Health | Payer: Medicare Other | Attending: Internal Medicine | Admitting: Internal Medicine

## 2013-05-12 ENCOUNTER — Encounter (HOSPITAL_COMMUNITY): Payer: Self-pay | Admitting: Emergency Medicine

## 2013-05-12 ENCOUNTER — Emergency Department (HOSPITAL_COMMUNITY): Payer: Medicare Other

## 2013-05-12 DIAGNOSIS — Z8673 Personal history of transient ischemic attack (TIA), and cerebral infarction without residual deficits: Secondary | ICD-10-CM

## 2013-05-12 DIAGNOSIS — I10 Essential (primary) hypertension: Secondary | ICD-10-CM

## 2013-05-12 DIAGNOSIS — A0472 Enterocolitis due to Clostridium difficile, not specified as recurrent: Secondary | ICD-10-CM | POA: Diagnosis present

## 2013-05-12 DIAGNOSIS — F05 Delirium due to known physiological condition: Secondary | ICD-10-CM | POA: Diagnosis present

## 2013-05-12 DIAGNOSIS — Z79899 Other long term (current) drug therapy: Secondary | ICD-10-CM

## 2013-05-12 DIAGNOSIS — E039 Hypothyroidism, unspecified: Secondary | ICD-10-CM | POA: Diagnosis present

## 2013-05-12 DIAGNOSIS — J4489 Other specified chronic obstructive pulmonary disease: Secondary | ICD-10-CM | POA: Diagnosis present

## 2013-05-12 DIAGNOSIS — J449 Chronic obstructive pulmonary disease, unspecified: Secondary | ICD-10-CM | POA: Diagnosis present

## 2013-05-12 DIAGNOSIS — R509 Fever, unspecified: Secondary | ICD-10-CM | POA: Diagnosis present

## 2013-05-12 DIAGNOSIS — Z96659 Presence of unspecified artificial knee joint: Secondary | ICD-10-CM

## 2013-05-12 DIAGNOSIS — R404 Transient alteration of awareness: Secondary | ICD-10-CM

## 2013-05-12 DIAGNOSIS — S72402A Unspecified fracture of lower end of left femur, initial encounter for closed fracture: Secondary | ICD-10-CM

## 2013-05-12 DIAGNOSIS — D62 Acute posthemorrhagic anemia: Secondary | ICD-10-CM

## 2013-05-12 DIAGNOSIS — I714 Abdominal aortic aneurysm, without rupture, unspecified: Secondary | ICD-10-CM

## 2013-05-12 DIAGNOSIS — T8119XA Other postprocedural shock, initial encounter: Secondary | ICD-10-CM

## 2013-05-12 DIAGNOSIS — Z801 Family history of malignant neoplasm of trachea, bronchus and lung: Secondary | ICD-10-CM

## 2013-05-12 DIAGNOSIS — E669 Obesity, unspecified: Secondary | ICD-10-CM

## 2013-05-12 DIAGNOSIS — B372 Candidiasis of skin and nail: Secondary | ICD-10-CM | POA: Diagnosis present

## 2013-05-12 DIAGNOSIS — J189 Pneumonia, unspecified organism: Secondary | ICD-10-CM

## 2013-05-12 DIAGNOSIS — Z8249 Family history of ischemic heart disease and other diseases of the circulatory system: Secondary | ICD-10-CM

## 2013-05-12 DIAGNOSIS — I2510 Atherosclerotic heart disease of native coronary artery without angina pectoris: Secondary | ICD-10-CM

## 2013-05-12 DIAGNOSIS — I4891 Unspecified atrial fibrillation: Secondary | ICD-10-CM

## 2013-05-12 DIAGNOSIS — E870 Hyperosmolality and hypernatremia: Secondary | ICD-10-CM | POA: Diagnosis present

## 2013-05-12 DIAGNOSIS — E872 Acidosis, unspecified: Secondary | ICD-10-CM | POA: Diagnosis present

## 2013-05-12 DIAGNOSIS — I4892 Unspecified atrial flutter: Secondary | ICD-10-CM | POA: Diagnosis present

## 2013-05-12 DIAGNOSIS — I959 Hypotension, unspecified: Secondary | ICD-10-CM

## 2013-05-12 DIAGNOSIS — E871 Hypo-osmolality and hyponatremia: Secondary | ICD-10-CM | POA: Diagnosis present

## 2013-05-12 DIAGNOSIS — Z8612 Personal history of poliomyelitis: Secondary | ICD-10-CM

## 2013-05-12 DIAGNOSIS — Z87891 Personal history of nicotine dependence: Secondary | ICD-10-CM

## 2013-05-12 DIAGNOSIS — E43 Unspecified severe protein-calorie malnutrition: Secondary | ICD-10-CM

## 2013-05-12 DIAGNOSIS — Z9089 Acquired absence of other organs: Secondary | ICD-10-CM

## 2013-05-12 DIAGNOSIS — S8012XA Contusion of left lower leg, initial encounter: Secondary | ICD-10-CM

## 2013-05-12 DIAGNOSIS — Z96649 Presence of unspecified artificial hip joint: Secondary | ICD-10-CM

## 2013-05-12 DIAGNOSIS — A419 Sepsis, unspecified organism: Principal | ICD-10-CM | POA: Diagnosis present

## 2013-05-12 DIAGNOSIS — J690 Pneumonitis due to inhalation of food and vomit: Secondary | ICD-10-CM | POA: Diagnosis present

## 2013-05-12 DIAGNOSIS — Z7901 Long term (current) use of anticoagulants: Secondary | ICD-10-CM

## 2013-05-12 DIAGNOSIS — E785 Hyperlipidemia, unspecified: Secondary | ICD-10-CM | POA: Diagnosis present

## 2013-05-12 DIAGNOSIS — R410 Disorientation, unspecified: Secondary | ICD-10-CM | POA: Diagnosis present

## 2013-05-12 DIAGNOSIS — R6520 Severe sepsis without septic shock: Secondary | ICD-10-CM

## 2013-05-12 DIAGNOSIS — I639 Cerebral infarction, unspecified: Secondary | ICD-10-CM

## 2013-05-12 LAB — COMPREHENSIVE METABOLIC PANEL
ALT: 16 U/L (ref 0–53)
AST: 22 U/L (ref 0–37)
Albumin: 2.5 g/dL — ABNORMAL LOW (ref 3.5–5.2)
Alkaline Phosphatase: 105 U/L (ref 39–117)
BILIRUBIN TOTAL: 0.5 mg/dL (ref 0.3–1.2)
BUN: 12 mg/dL (ref 6–23)
CHLORIDE: 98 meq/L (ref 96–112)
CO2: 24 meq/L (ref 19–32)
CREATININE: 0.62 mg/dL (ref 0.50–1.35)
Calcium: 9 mg/dL (ref 8.4–10.5)
GFR calc Af Amer: >90 mL/min (ref >90)
GFR, EST NON AFRICAN AMERICAN: 89 mL/min — AB (ref >90)
Glucose, Bld: 121 mg/dL — ABNORMAL HIGH (ref 70–99)
Potassium: 4.5 mEq/L (ref 3.7–5.3)
SODIUM: 135 meq/L — AB (ref 137–147)
Total Protein: 6.8 g/dL (ref 6.0–8.3)

## 2013-05-12 LAB — URINALYSIS, ROUTINE W REFLEX MICROSCOPIC
BILIRUBIN URINE: NEGATIVE
Glucose, UA: NEGATIVE mg/dL
HGB URINE DIPSTICK: NEGATIVE
Ketones, ur: NEGATIVE mg/dL
Leukocytes, UA: NEGATIVE
Nitrite: NEGATIVE
Protein, ur: NEGATIVE mg/dL
Specific Gravity, Urine: 1.022 (ref 1.005–1.030)
Urobilinogen, UA: 0.2 mg/dL (ref 0.0–1.0)
pH: 6.5 (ref 5.0–8.0)

## 2013-05-12 LAB — CBC WITH DIFFERENTIAL/PLATELET
Basophils Absolute: 0 10*3/uL (ref 0.0–0.1)
Basophils Relative: 0 % (ref 0–1)
Eosinophils Absolute: 0.1 10*3/uL (ref 0.0–0.7)
Eosinophils Relative: 1 % (ref 0–5)
HEMATOCRIT: 35.8 % — AB (ref 39.0–52.0)
Hemoglobin: 12.2 g/dL — ABNORMAL LOW (ref 13.0–17.0)
LYMPHS ABS: 1.2 10*3/uL (ref 0.7–4.0)
LYMPHS PCT: 9 % — AB (ref 12–46)
MCH: 29.4 pg (ref 26.0–34.0)
MCHC: 34.1 g/dL (ref 30.0–36.0)
MCV: 86.3 fL (ref 78.0–100.0)
MONO ABS: 0.9 10*3/uL (ref 0.1–1.0)
Monocytes Relative: 7 % (ref 3–12)
Neutro Abs: 11.3 10*3/uL — ABNORMAL HIGH (ref 1.7–7.7)
Neutrophils Relative %: 84 % — ABNORMAL HIGH (ref 43–77)
Platelets: 252 10*3/uL (ref 150–400)
RBC: 4.15 MIL/uL — AB (ref 4.22–5.81)
RDW: 16.8 % — AB (ref 11.5–15.5)
WBC: 13.6 10*3/uL — AB (ref 4.0–10.5)

## 2013-05-12 LAB — PROCALCITONIN: Procalcitonin: <0.1 ng/mL

## 2013-05-12 LAB — CG4 I-STAT (LACTIC ACID): Lactic Acid, Venous: 2.28 mmol/L — ABNORMAL HIGH (ref 0.5–2.2)

## 2013-05-12 MED ORDER — METOPROLOL TARTRATE 1 MG/ML IV SOLN: 2.5000 mg | Freq: Three times a day (TID) | INTRAVENOUS | Status: DC | PRN

## 2013-05-12 MED ORDER — BUPROPION HCL ER (SR) 100 MG PO TB12
100.0000 mg | ORAL_TABLET | Freq: Every day | ORAL | Status: DC
Start: 2013-05-13 — End: 2013-05-16
  Administered 2013-05-13 – 2013-05-16 (×4): 100 mg via ORAL
  Filled 2013-05-12 (×4): qty 1

## 2013-05-12 MED ORDER — VANCOMYCIN HCL IN DEXTROSE 1-5 GM/200ML-% IV SOLN
1000.0000 mg | Freq: Two times a day (BID) | INTRAVENOUS | Status: DC
  Filled 2013-05-12: qty 200

## 2013-05-12 MED ORDER — ACETAMINOPHEN 650 MG RE SUPP
650.0000 mg | Freq: Once | RECTAL | Status: AC
  Administered 2013-05-12: 650 mg via RECTAL
  Filled 2013-05-12: qty 1

## 2013-05-12 MED ORDER — DEXTROSE 5 % IV SOLN
1.0000 g | INTRAVENOUS | Status: DC
  Administered 2013-05-12 – 2013-05-15 (×4): 1 g via INTRAVENOUS
  Filled 2013-05-12 (×5): qty 10

## 2013-05-12 MED ORDER — SENNA 8.6 MG PO TABS
1.0000 | ORAL_TABLET | Freq: Two times a day (BID) | ORAL | Status: DC
  Administered 2013-05-12 – 2013-05-13 (×2): 8.6 mg via ORAL
  Filled 2013-05-12 (×4): qty 1

## 2013-05-12 MED ORDER — SODIUM CHLORIDE 0.9 % IV SOLN
INTRAVENOUS | Status: AC
  Administered 2013-05-13 (×2): via INTRAVENOUS

## 2013-05-12 MED ORDER — SIMVASTATIN 10 MG PO TABS
10.0000 mg | ORAL_TABLET | Freq: Every day | ORAL | Status: DC
  Administered 2013-05-12 – 2013-05-15 (×4): 10 mg via ORAL
  Filled 2013-05-12 (×5): qty 1

## 2013-05-12 MED ORDER — BIOTENE DRY MOUTH MT LIQD
15.0000 mL | Freq: Two times a day (BID) | OROMUCOSAL | Status: DC
  Administered 2013-05-12 – 2013-05-15 (×7): 15 mL via OROMUCOSAL

## 2013-05-12 MED ORDER — POTASSIUM CHLORIDE CRYS ER 20 MEQ PO TBCR
20.0000 meq | EXTENDED_RELEASE_TABLET | Freq: Every day | ORAL | Status: DC
  Administered 2013-05-13: 20 meq via ORAL
  Filled 2013-05-12 (×2): qty 1

## 2013-05-12 MED ORDER — RIVAROXABAN 20 MG PO TABS
20.0000 mg | ORAL_TABLET | Freq: Every day | ORAL | Status: DC
  Administered 2013-05-12 – 2013-05-15 (×4): 20 mg via ORAL
  Filled 2013-05-12 (×5): qty 1

## 2013-05-12 MED ORDER — VANCOMYCIN HCL 125 MG PO CAPS
125.0000 mg | ORAL_CAPSULE | Freq: Four times a day (QID) | ORAL | Status: DC
  Filled 2013-05-12 (×2): qty 1

## 2013-05-12 MED ORDER — CEFEPIME HCL 2 G IJ SOLR
2.0000 g | Freq: Once | INTRAMUSCULAR | Status: AC
  Administered 2013-05-12: 2 g via INTRAVENOUS

## 2013-05-12 MED ORDER — SODIUM CHLORIDE 0.9 % IV SOLN
2000.0000 mg | Freq: Once | INTRAVENOUS | Status: AC
  Administered 2013-05-12: 2000 mg via INTRAVENOUS
  Filled 2013-05-12: qty 2000

## 2013-05-12 MED ORDER — ALBUTEROL SULFATE (2.5 MG/3ML) 0.083% IN NEBU: 2.5000 mg | INHALATION_SOLUTION | Freq: Four times a day (QID) | RESPIRATORY_TRACT | Status: DC | PRN

## 2013-05-12 MED ORDER — VANCOMYCIN HCL IN DEXTROSE 1-5 GM/200ML-% IV SOLN: 1000.0000 mg | Freq: Once | INTRAVENOUS | Status: DC

## 2013-05-12 MED ORDER — DEXTROSE 5 % IV SOLN
1.0000 g | Freq: Three times a day (TID) | INTRAVENOUS | Status: DC
  Filled 2013-05-12 (×2): qty 1

## 2013-05-12 MED ORDER — VITAMIN D3 25 MCG (1000 UNIT) PO TABS
1000.0000 [IU] | ORAL_TABLET | Freq: Every day | ORAL | Status: DC
  Administered 2013-05-13 – 2013-05-16 (×4): 1000 [IU] via ORAL
  Filled 2013-05-12 (×4): qty 1

## 2013-05-12 MED ORDER — FLUOXETINE HCL 10 MG PO CAPS
10.0000 mg | ORAL_CAPSULE | Freq: Every day | ORAL | Status: DC
  Administered 2013-05-13 – 2013-05-16 (×4): 10 mg via ORAL
  Filled 2013-05-12 (×4): qty 1

## 2013-05-12 MED ORDER — VANCOMYCIN 50 MG/ML ORAL SOLUTION
125.0000 mg | Freq: Four times a day (QID) | ORAL | Status: DC
  Administered 2013-05-12 – 2013-05-16 (×15): 125 mg via ORAL
  Filled 2013-05-12 (×22): qty 2.5

## 2013-05-12 MED ORDER — LEVOTHYROXINE SODIUM 75 MCG PO TABS
75.0000 ug | ORAL_TABLET | Freq: Every day | ORAL | Status: DC
  Administered 2013-05-13 – 2013-05-16 (×4): 75 ug via ORAL
  Filled 2013-05-12 (×6): qty 1

## 2013-05-12 MED ORDER — ONDANSETRON HCL 4 MG/2ML IJ SOLN: 4.0000 mg | Freq: Four times a day (QID) | INTRAMUSCULAR | Status: DC | PRN

## 2013-05-12 MED ORDER — DIGOXIN 125 MCG PO TABS
0.1250 mg | ORAL_TABLET | Freq: Every day | ORAL | Status: DC
  Administered 2013-05-13 – 2013-05-16 (×4): 0.125 mg via ORAL
  Filled 2013-05-12 (×4): qty 1

## 2013-05-12 MED ORDER — METRONIDAZOLE IN NACL 5-0.79 MG/ML-% IV SOLN
500.0000 mg | Freq: Three times a day (TID) | INTRAVENOUS | Status: DC
  Administered 2013-05-12 – 2013-05-16 (×11): 500 mg via INTRAVENOUS
  Filled 2013-05-12 (×13): qty 100

## 2013-05-12 MED ORDER — FERROUS SULFATE 325 (65 FE) MG PO TABS
325.0000 mg | ORAL_TABLET | Freq: Every day | ORAL | Status: DC
  Administered 2013-05-13 – 2013-05-16 (×4): 325 mg via ORAL
  Filled 2013-05-12 (×6): qty 1

## 2013-05-12 MED ORDER — SODIUM CHLORIDE 0.9 % IJ SOLN
3.0000 mL | Freq: Two times a day (BID) | INTRAMUSCULAR | Status: DC
  Administered 2013-05-12 – 2013-05-16 (×6): 3 mL via INTRAVENOUS

## 2013-05-12 MED ORDER — SODIUM CHLORIDE 0.9 % IV SOLN
1000.0000 mL | INTRAVENOUS | Status: DC
  Administered 2013-05-12: 1000 mL via INTRAVENOUS

## 2013-05-12 MED ORDER — ADULT MULTIVITAMIN W/MINERALS CH
1.0000 | ORAL_TABLET | Freq: Every day | ORAL | Status: DC
  Administered 2013-05-13 – 2013-05-16 (×4): 1 via ORAL
  Filled 2013-05-12 (×4): qty 1

## 2013-05-12 MED ORDER — ONDANSETRON HCL 4 MG PO TABS: 4.0000 mg | ORAL_TABLET | Freq: Four times a day (QID) | ORAL | Status: DC | PRN

## 2013-05-12 MED ORDER — SODIUM CHLORIDE 0.9 % IV SOLN
1000.0000 mL | Freq: Once | INTRAVENOUS | Status: AC
Start: 2013-05-12 — End: 2013-05-12
  Administered 2013-05-12: 1000 mL via INTRAVENOUS

## 2013-05-12 NOTE — Progress Notes (Signed)
ANTIBIOTIC CONSULT NOTE - INITIAL  Pharmacy Consult for vancomycin + cefepime Indication: rule out sepsis  No Known Allergies  Patient Measurements:   Adjusted Body Weight:   Vital Signs: Temp: 100.3 F (37.9 C) (01/24 1308) Temp src: Oral (01/24 1308) BP: 115/65 mmHg (01/24 1430) Pulse Rate: 100 (01/24 1430) Intake/Output from previous day:   Intake/Output from this shift: Total I/O In: -  Out: 300 [Urine:300]  Labs:  Recent Labs  05/12/13 1344  WBC 13.6*  HGB 12.2*  PLT 252  CREATININE 0.62   The CrCl is unknown because both a height and weight (above a minimum accepted value) are required for this calculation. No results found for this basename: VANCOTROUGH, Corlis Leak, VANCORANDOM, GENTTROUGH, GENTPEAK, GENTRANDOM, TOBRATROUGH, TOBRAPEAK, TOBRARND, AMIKACINPEAK, AMIKACINTROU, AMIKACIN,  in the last 72 hours   Microbiology: Recent Results (from the past 720 hour(s))  CULTURE, BLOOD (ROUTINE X 2)     Status: None   Collection Time    05/03/13  3:05 PM      Result Value Range Status   Specimen Description BLOOD ARM RIGHT   Final   Special Requests BOTTLES DRAWN AEROBIC ONLY 5CC   Final   Culture  Setup Time     Final   Value: 05/03/2013 20:19     Performed at [facility]   Culture     Final   Value: NO GROWTH 5 DAYS     Performed at [facility]   Report Status 05/09/2013 FINAL   Final  CULTURE, BLOOD (ROUTINE X 2)     Status: None   Collection Time    05/03/13  4:00 PM      Result Value Range Status   Specimen Description BLOOD LEFT FOREARM   Final   Special Requests BOTTLES DRAWN AEROBIC AND ANAEROBIC 5CC   Final   Culture  Setup Time     Final   Value: 05/03/2013 21:11     Performed at [facility]   Culture     Final   Value: NO GROWTH 5 DAYS     Performed at [facility]   Report Status 05/09/2013 FINAL   Final  CLOSTRIDIUM DIFFICILE BY PCR     Status: Abnormal   Collection Time    05/03/13  8:20 PM      Result  Value Range Status   C difficile by pcr POSITIVE (*) NEGATIVE Final   Comment: CRITICAL RESULT CALLED TO, READ BACK BY AND VERIFIED WITH:     Tracie Harrier RN 12:40 05/04/13 ([email or web address])  MRSA PCR SCREENING     Status: None   Collection Time    05/04/13  7:08 AM      Result Value Range Status   MRSA by PCR NEGATIVE  NEGATIVE Final   Comment:            The GeneXpert MRSA Assay (FDA     approved for NASAL specimens     only), is one component of a     comprehensive MRSA colonization     surveillance program. It is not     intended to diagnose MRSA     infection nor to guide or     monitor treatment for     MRSA infections.    Medical History: Past Medical History  Diagnosis Date  . Hypertension   . Hyperlipidemia   . AAA (abdominal aortic aneurysm)   . Arthritis   . CAD (coronary artery disease)   . Thyroid  disease     hypothyroidism  . Dysrhythmia     atrial flutter  . Shortness of breath     a little with exertion  . Polio 1934  . Atrial flutter     chronic anticoagulation  . COPD (chronic obstructive pulmonary disease)   . History of nuclear stress test 04/2010    dipyridamole; small, mostly fixed basal to mid inferior and inferoseptal defect (gut artifact v. scar); post stress EF 62%; non-diagnostic for ischemia; low risk     Medications:  Anti-infectives   Start     Dose/Rate Route Frequency Ordered Stop   05/13/13 0400  vancomycin (VANCOCIN) IVPB 1000 mg/200 mL premix     1,000 mg 200 mL/hr over 60 Minutes Intravenous Every 12 hours 05/12/13 1437     05/12/13 2200  ceFEPIme (MAXIPIME) 1 g in dextrose 5 % 50 mL IVPB     1 g 100 mL/hr over 30 Minutes Intravenous 3 times per day 05/12/13 1437     05/12/13 1345  vancomycin (VANCOCIN) 2,000 mg in sodium chloride 0.9 % 500 mL IVPB     2,000 mg 250 mL/hr over 120 Minutes Intravenous  Once 05/12/13 1331     05/12/13 1330  ceFEPIme (MAXIPIME) 2 g in dextrose 5 % 50 mL IVPB     2 g 100 mL/hr over 30 Minutes Intravenous   Once 05/12/13 1320 05/12/13 1436   05/12/13 1330  vancomycin (VANCOCIN) IVPB 1000 mg/200 mL premix  Status:  Discontinued     1,000 mg 200 mL/hr over 60 Minutes Intravenous  Once 05/12/13 1320 05/12/13 1331     Assessment: 62 yom presented to the ED with AMS. To start empiric cefepime + vancomycin for possible sepsis. Tmax is 100.3, WBC 13.6 and lactic acid is elevated at 2.28. SCr is WNL at 0.64. Pt was on levaquin PTA and also PO vancomycin for treatment of Cdiff.   Vanc 1/24>> Cefepime 1/24>>  Goal of Therapy:  Vancomycin trough level 15-20 mcg/ml  Plan:  1. Vancomycin 2gm IV x 1 then 1gm IV Q12H 2. Cefepime 2gm IV x 1 then 1gm IV Q8H 3. F/u renal fxn, C&S, clinical status and trough at [place], Rande Lawman 05/12/2013,2:37 PM

## 2013-05-12 NOTE — ED Provider Notes (Signed)
CSN: [id number]     Arrival date & time 05/12/13  1301 History   First MD Initiated Contact with Patient 05/12/13 1304     Chief Complaint  Patient presents with  . Altered Mental Status  . Atrial Fibrillation  . Cough   (Consider location/radiation/quality/duration/timing/severity/associated sxs/prior Treatment) Patient is a 80 y.o. male presenting with altered mental status, atrial fibrillation, and cough. The history is provided by a relative and the patient.  Altered Mental Status Presenting symptoms: confusion   Severity:  Moderate Most recent episode:  Today Episode history:  Single Timing:  Constant Progression:  Unchanged Chronicity:  Recurrent Context: recent change in medication, recent illness and recent infection (recent hospitalization for HCAP)   Associated symptoms: fever and rash   Associated symptoms: no abdominal pain, no difficulty breathing, no nausea and no vomiting   Atrial Fibrillation Pertinent negatives include no abdominal pain.  Cough Associated symptoms: fever and rash    LEVEL 5 CAVEAT DUE TO ALTERED MENTAL STATUS Past Medical History  Diagnosis Date  . Hypertension   . Hyperlipidemia   . AAA (abdominal aortic aneurysm)   . Arthritis   . CAD (coronary artery disease)   . Thyroid disease     hypothyroidism  . Dysrhythmia     atrial flutter  . Shortness of breath     a little with exertion  . Polio 1934  . Atrial flutter     chronic anticoagulation  . COPD (chronic obstructive pulmonary disease)   . History of nuclear stress test 04/2010    dipyridamole; small, mostly fixed basal to mid inferior and inferoseptal defect (gut artifact v. scar); post stress EF 62%; non-diagnostic for ischemia; low risk    Past Surgical History  Procedure Laterality Date  . Abdominal aortic aneurysm repair  05/21/2010    aorto bi-iliac BPG  . Appendectomy  1949  . Popliteal synovial cyst excision Left   . Joint replacement      Knee and Hip replacements  .  Total hip arthroplasty Left 1994  . Knee arthroplasty Left 2004  . Total hip revision Left 12/04/2012    Procedure: LEFT TOTAL HIP REVISION;  Surgeon: Mauri Pole, MD;  Location: [facility];  Service: Orthopedics;  Laterality: Left;  . Cardiac catheterization  2009    in [place]; 50-60% RCA  . Transthoracic echocardiogram  04/2010    EF =>55%; RV borderline dilated; LA mildly dilated; mild mitral annular calcif & mild MR; mild TR; mild calcif of AV leaflets with mild AV stenosis; aortic root sclerosis/calcif  . Orif femur fracture Left 01/22/2013    Procedure: OPEN REDUCTION INTERNAL FIXATION (ORIF) DISTAL FEMUR FRACTURE;  Surgeon: Mauri Pole, MD;  Location: [place];  Service: Orthopedics;  Laterality: Left;   Family History  Problem Relation Age of Onset  . Other Father     varicose veins  . Hyperlipidemia Son   . Hypertension Son   . AAA (abdominal aortic aneurysm) Son   . Lung cancer Brother    History  Substance Use Topics  . Smoking status: Former Smoker -- 1.00 packs/day for 25 years    Types: Cigarettes    Quit date: 04/19/1966  . Smokeless tobacco: Never Used     Comment: 100 pack years per 08/2012 note  . Alcohol Use: 0.0 oz/week     Comment: glass of wine daily, scotch 3 or 4 times a week. Not in last 3 months    Review of Systems  Unable to perform  ROS: Mental status change  Constitutional: Positive for fever.  Respiratory: Positive for cough.   Gastrointestinal: Negative for nausea, vomiting and abdominal pain.  Skin: Positive for rash.  Psychiatric/Behavioral: Positive for confusion.    Allergies  Review of patient's allergies indicates no known allergies.  Home Medications   Current Outpatient Rx  Name  Route  Sig  Dispense  Refill  . albuterol (PROVENTIL) (2.5 MG/3ML) 0.083% nebulizer solution   Nebulization   Take 2.5 mg by nebulization every 6 (six) hours as needed for wheezing or shortness of breath.         [name] buPROPion (WELLBUTRIN SR) 100 MG 12 hr  tablet   Oral   Take 100 mg by mouth daily.         . cholecalciferol (VITAMIN D) 1000 UNITS tablet   Oral   Take 1,000 Units by mouth daily.         . digoxin (LANOXIN) 0.125 MG tablet   Oral   Take 1 tablet (0.125 mg total) by mouth daily.         . feeding supplement, ENSURE COMPLETE, (ENSURE COMPLETE) LIQD   Oral   Take 237 mLs by mouth 2 (two) times daily between meals.         . ferrous sulfate 325 (65 FE) MG tablet   Oral   Take 325 mg by mouth daily with breakfast.         . FLUoxetine (PROZAC) 10 MG capsule   Oral   Take 1 capsule (10 mg total) by mouth daily.   30 capsule   0   . levofloxacin (LEVAQUIN) 500 MG tablet   Oral   Take 1 tablet (500 mg total) by mouth daily. For 6 more days   6 tablet   0   . levothyroxine (SYNTHROID, LEVOTHROID) 75 MCG tablet   Oral   Take 75 mcg by mouth daily before breakfast.         . lovastatin (MEVACOR) 40 MG tablet   Oral   Take 40 mg by mouth at bedtime.         . Multiple Vitamin (MULTIVITAMIN WITH MINERALS) TABS tablet   Oral   Take 1 tablet by mouth daily.         . pantoprazole (PROTONIX) 40 MG tablet   Oral   Take 1 tablet (40 mg total) by mouth daily.         . potassium chloride SA (K-DUR,KLOR-CON) 20 MEQ tablet   Oral   Take 20 mEq by mouth daily.         . Rivaroxaban (XARELTO) 20 MG TABS   Oral   Take 20 mg by mouth daily.         [name] trolamine salicylate (ASPERCREME) 10 % cream   Topical   Apply 1 application topically as needed (for muscle pain).         . vancomycin (VANCOCIN) 125 MG capsule   Oral   Take 1 capsule (125 mg total) by mouth 4 (four) times daily. For 12 more days   48 capsule   0    BP 131/65  Temp(Src) 100.3 F (37.9 C) (Oral)  Resp 21  SpO2 94% Physical Exam  Nursing note and vitals reviewed. Constitutional: He is oriented to person, place, and time. He appears well-developed and well-nourished. No distress.  HENT:  Head: Normocephalic and  atraumatic.  Mouth/Throat: No oropharyngeal exudate.  Eyes: EOM are normal. Pupils are equal, round, and reactive  to light.  Neck: Normal range of motion. Neck supple.  Cardiovascular: Normal rate and regular rhythm.  Exam reveals no friction rub.   No murmur heard. Pulmonary/Chest: Effort normal and breath sounds normal. No respiratory distress. He has no wheezes. He has no rales.  Abdominal: He exhibits no distension. There is no tenderness. There is no rebound.  Musculoskeletal: Normal range of motion. He exhibits no edema.  Neurological: He is alert and oriented to person, place, and time. No cranial nerve deficit. He exhibits normal muscle tone. Coordination normal.  Skin: Skin is warm. Rash noted. He is not diaphoretic.       ED Course  Procedures (including critical care time) Labs Review Labs Reviewed  CULTURE, BLOOD (ROUTINE X 2)  CULTURE, BLOOD (ROUTINE X 2)  URINE CULTURE  CBC WITH DIFFERENTIAL  COMPREHENSIVE METABOLIC PANEL  URINALYSIS, ROUTINE W REFLEX MICROSCOPIC  PROCALCITONIN   Imaging Review No results found.  EKG Interpretation    Date/Time:  Saturday May 12 2013 13:08:14 EST Ventricular Rate:  117 PR Interval:    QRS Duration: 112 QT Interval:  443 QTC Calculation: 618 R Axis:   99 Text Interpretation:  Atrial fibrillation/Atrial flutter Paired ventricular premature complexes Probable lateral infarct, age indeterminate Borderline ST elevation, inferior leads Similar to previous Confirmed by [facility]  MD, [place] ([id number]) on 05/12/2013 1:51:44 PM            MDM   1. Delirium   2. Atrial fibrillation with RVR   3. C. difficile diarrhea   4. Fever   5. Hypernatremia   6. Lactic acidosis    81M here with delirium, tachycardia, fever. Multiple recent admissions s/p femur fracture, recently here for C. Diff colitis and HCAP. Currently on Vanc PO and Levaquin PO as an outpatient. Last admission 1 week ago. This morning not acting like himself,  confused per daughter, whom he lives with. Recently in a NH and on multiple anti-depressant meds. Patient lives at home, family having difficulty with management of bed sores - new bed sore now, stage 2 on R lateral sacrum. Also has groin yeast infection.  Here, appropriate but confused, febrile, tachycardic. Monitor shows Afib with RVR, initial heart rate in the 120s-130s, will treat with fluids and anti-pyretics initially. On 2L oxygen at baseline, lung with diffuse rhonchi, could be due to upper airway noise. Sepsis protocol initiated, IV vanc and cefepime initiated.  HR improved with IVF and anti-pyretics. No pneumonia on CXR. No abdominal pain. Leukocytosis present. Admitted to medicine.     Osvaldo Shipper, MD 05/12/13 [phone]

## 2013-05-12 NOTE — ED Notes (Signed)
Per EMS pt came from home where family noticed that he started acting more confused. No stroke symptoms with EMS and is oriented x3. Pt lungs have bilateral ronchi, non productive cough. Pt recently diagnosed with COPD and is on [facility] at home. Pt afib on the monitor, family denies hx of same. Pt is on xeralto. Pt currently being treated for cdiff.

## 2013-05-12 NOTE — ED Notes (Signed)
Lab results reported to EDP.

## 2013-05-12 NOTE — ED Notes (Signed)
Pt MAP at 77; last BP at 122/66

## 2013-05-12 NOTE — ED Notes (Addendum)
Goals for sepsis therapy reviewed: MAP currently above 65 (78), pulse rate under 125 (95 bpm), discussion reviewed with Dr. Mingo Amber about vasopressor use (no need at this time)

## 2013-05-12 NOTE — H&P (Signed)
[facility] History and Physical  Austin [name] TFT:[id number] DOB: 17-Oct-1926 DOA: 05/12/2013  Referring physician: Dr. Mingo Amber PCP: [name], MD   Chief Complaint: Confusion  HPI: Austin [name] is a 80 y.o. male  80 year old male with past medical history of atrial flutter on skeletal hospitalized in October for her femur fracture not weightbearing discharge to skilled nursing facility came back to the hospital for health care associated pneumonia and C. Difficile on 05/07/2013 on vancomycin by mouth and Levaquin. He relates he's been having difficulties with eating as he has been coughing. Per daughter 24 hours prior to admission he started getting confused to the point where this morning he was hallucinating and not recognizing them. She took her temperature at home and it was 101 said he decided to bring her to the ED. He relates no new rashes, chest pain falls or nausea vomiting. He continues to have frequent amounts of loose stools.   In the ED: He was found to Be in A. Fib with RVR was given a liter of normal saline and his heart rate went down to the 706C basic metabolic panel was done that shows a sodium 135 chloride 98 creatinine 0.5 at baseline, lactic acid of 2.2 white count 13 with an ANC of 13.1.chest x-ray shows  Moderate left lung infiltrate.  Review of Systems:  Constitutional:  No weight loss, night sweats, Fevers, chills, fatigue.  HEENT:  No headaches, Difficulty swallowing,Tooth/dental problems,Sore throat,  No sneezing, itching, ear ache, nasal congestion, post nasal drip,  Cardio-vascular:  No chest pain, Orthopnea, PND, swelling in lower extremities, anasarca, dizziness, palpitations  GI:  No heartburn, indigestion, abdominal pain, nausea, vomiting, diarrhea, change in bowel habits, loss of appetite  Skin:  no rash or lesions.  GU:  no dysuria, change in color of urine, no urgency or frequency. No flank pain.  Musculoskeletal:  No joint  pain or swelling. No decreased range of motion. No back pain.  Psych:  No change in mood or affect. No depression or anxiety. No memory loss.   Past Medical History  Diagnosis Date  . Hypertension   . Hyperlipidemia   . AAA (abdominal aortic aneurysm)   . Arthritis   . CAD (coronary artery disease)   . Thyroid disease     hypothyroidism  . Dysrhythmia     atrial flutter  . Shortness of breath     a little with exertion  . Polio 1934  . Atrial flutter     chronic anticoagulation  . COPD (chronic obstructive pulmonary disease)   . History of nuclear stress test 04/2010    dipyridamole; small, mostly fixed basal to mid inferior and inferoseptal defect (gut artifact v. scar); post stress EF 62%; non-diagnostic for ischemia; low risk    Past Surgical History  Procedure Laterality Date  . Abdominal aortic aneurysm repair  05/21/2010    aorto bi-iliac BPG  . Appendectomy  1949  . Popliteal synovial cyst excision Left   . Joint replacement      Knee and Hip replacements  . Total hip arthroplasty Left 1994  . Knee arthroplasty Left 2004  . Total hip revision Left 12/04/2012    Procedure: LEFT TOTAL HIP REVISION;  Surgeon: Mauri Pole, MD;  Location: [facility];  Service: Orthopedics;  Laterality: Left;  . Cardiac catheterization  2009    in [place]; 50-60% RCA  . Transthoracic echocardiogram  04/2010    EF =>55%; RV borderline dilated; LA mildly dilated; mild mitral  annular calcif & mild MR; mild TR; mild calcif of AV leaflets with mild AV stenosis; aortic root sclerosis/calcif  . Orif femur fracture Left 01/22/2013    Procedure: OPEN REDUCTION INTERNAL FIXATION (ORIF) DISTAL FEMUR FRACTURE;  Surgeon: Mauri Pole, MD;  Location: [place];  Service: Orthopedics;  Laterality: Left;   Social History:  reports that he quit smoking about 47 years ago. His smoking use included Cigarettes. He has a 25 pack-year smoking history. He has never used smokeless tobacco. He reports that he drinks alcohol.  He reports that he does not use illicit drugs.  No Known Allergies  Family History  Problem Relation Age of Onset  . Other Father     varicose veins  . Hyperlipidemia Son   . Hypertension Son   . AAA (abdominal aortic aneurysm) Son   . Lung cancer Brother      Prior to Admission medications   Medication Sig Start Date End Date Taking? Authorizing Provider  albuterol (PROVENTIL) (2.5 MG/3ML) 0.083% nebulizer solution Take 2.5 mg by nebulization every 6 (six) hours as needed for wheezing or shortness of breath.   Yes Historical Provider, MD  buPROPion (WELLBUTRIN SR) 100 MG 12 hr tablet Take 100 mg by mouth daily.   Yes Historical Provider, MD  cholecalciferol (VITAMIN D) 1000 UNITS tablet Take 1,000 Units by mouth daily.   Yes Historical Provider, MD  digoxin (LANOXIN) 0.125 MG tablet Take 1 tablet (0.125 mg total) by mouth daily. 01/28/13  Yes Ripudeep Krystal Eaton, MD  feeding supplement, ENSURE COMPLETE, (ENSURE COMPLETE) LIQD Take 237 mLs by mouth 2 (two) times daily between meals. 01/27/13  Yes Ripudeep Krystal Eaton, MD  ferrous sulfate 325 (65 FE) MG tablet Take 325 mg by mouth daily with breakfast.   Yes Historical Provider, MD  FLUoxetine (PROZAC) 10 MG capsule Take 1 capsule (10 mg total) by mouth daily. 05/07/13  Yes Bonnielee Haff, MD  levofloxacin (LEVAQUIN) 500 MG tablet Take 1 tablet (500 mg total) by mouth daily. For 6 more days 05/07/13  Yes Bonnielee Haff, MD  levothyroxine (SYNTHROID, LEVOTHROID) 75 MCG tablet Take 75 mcg by mouth daily before breakfast.   Yes Historical Provider, MD  lovastatin (MEVACOR) 40 MG tablet Take 40 mg by mouth at bedtime.   Yes Historical Provider, MD  Multiple Vitamin (MULTIVITAMIN WITH MINERALS) TABS tablet Take 1 tablet by mouth daily.   Yes Historical Provider, MD  pantoprazole (PROTONIX) 40 MG tablet Take 1 tablet (40 mg total) by mouth daily. 01/27/13  Yes Ripudeep Krystal Eaton, MD  potassium chloride SA (K-DUR,KLOR-CON) 20 MEQ tablet Take 20 mEq by mouth  daily.   Yes Historical Provider, MD  Rivaroxaban (XARELTO) 20 MG TABS Take 20 mg by mouth daily.   Yes Historical Provider, MD  trolamine salicylate (ASPERCREME) 10 % cream Apply 1 application topically as needed (for muscle pain).   Yes Historical Provider, MD  vancomycin (VANCOCIN) 125 MG capsule Take 1 capsule (125 mg total) by mouth 4 (four) times daily. For 12 more days 05/07/13  Yes Bonnielee Haff, MD   Physical Exam: Filed Vitals:   05/12/13 1615  BP: 119/68  Pulse: 112  Temp:   Resp: 18    BP 119/68  Pulse 112  Temp(Src) 100.5 F (38.1 C) (Rectal)  Resp 18  SpO2 97%  General:  Appears calm, in no acute distress he look emaciated. Eyes: PERRL, normal lids, irises & conjunctiva ENT: grossly normal hearing, lips & tongue Neck: no LAD, masses or  thyromegaly Cardiovascular: regular rate tachycardic normal research gallops. Respiratory: good air movement bilaterally with crackles on the left lung. Abdomen: soft, nt, nd Skin: there is one small palpable movable rubbery nodule 2 x 2 in size. Musculoskeletal: grossly normal tone BUE/BLE Psychiatric: grossly normal mood and affect, speech fluent and appropriate           Labs on Admission:  Basic Metabolic Panel:  Recent Labs Lab 05/06/13 0522 05/07/13 0410 05/12/13 1344  NA 136* 136* 135*  K 3.2* 3.5* 4.5  CL 99 99 98  CO2 24 23 24   GLUCOSE 89 98 121*  BUN 5* 3* 12  CREATININE 0.54 0.53 0.62  CALCIUM 8.2* 8.4 9.0   Liver Function Tests:  Recent Labs Lab 05/12/13 1344  AST 22  ALT 16  ALKPHOS 105  BILITOT 0.5  PROT 6.8  ALBUMIN 2.5*   No results found for this basename: LIPASE, AMYLASE,  in the last 168 hours No results found for this basename: AMMONIA,  in the last 168 hours CBC:  Recent Labs Lab 05/06/13 0522 05/12/13 1344  WBC 7.7 13.6*  NEUTROABS  --  11.3*  HGB 11.9* 12.2*  HCT 35.4* 35.8*  MCV 87.0 86.3  PLT 204 252   Cardiac Enzymes: No results found for this basename: CKTOTAL,  CKMB, CKMBINDEX, TROPONINI,  in the last 168 hours  BNP (last 3 results)  Recent Labs  05/03/13 1319  PROBNP 622.4*   CBG: No results found for this basename: GLUCAP,  in the last 168 hours  Radiological Exams on Admission: Dg Chest Port 1 View  05/12/2013   CLINICAL DATA:  Cough and congestion .  EXAM: PORTABLE CHEST - 1 VIEW  COMPARISON:  Chest x-ray 05/03/2013.  Chest x-ray 01/26/2013.  FINDINGS: Persistent atelectasis left upper lobe. Stable chronic interstitial changes consistent with interstitial fibrosis. No pleural effusion or pneumothorax. Stable cardiomegaly.  IMPRESSION: 1. Persistent subsegmental atelectasis left upper lobe. 2. Stable pulmonary interstitial fibrosis. 3. Stable cardiomegaly.   Electronically Signed   By: Marcello Moores  Register   On: 05/12/2013 14:12    EKG: Independently reviewed. A. Flutter with a heart rate of 115 have to fluids on telemetry decreased to 100-105.  Assessment/Plan Severe sepsis/  Fever/   Lactic acidosis: - I am concern about Aspiration PNA as he has been complaining of coughing with eating. - Check a swallowing evaluation. We'll cover him with vancomycin, Rocephin and Flagyl, (as treatment for HCAP )as he has C. Dif. Skin is intact. - Get blood cultures x2, check Influenza PCR. - Cont IV fluid for 24 hrs monitor I and O's. - Will get Orthopedic to evaluate weight bearing.  C. difficile diarrhea: - Cont vancomycin orally. - He cont to have diarrhea.  Hypernatremia: - Due to decrease intravascular vol. - Recheck in am.  Protein-calorie malnutrition, severe - Ensure TID nutrition consult.  Delirium: - Haldol PRN   Atrial fibrillation with RVR: - Resume home meds. - metoprolol IV for a HR.    Code Status: full Family Communication: daughter and son Disposition Plan: inpatient  Time spent: 3 minutes  Charlynne Cousins [facility] Pager [phone]

## 2013-05-13 DIAGNOSIS — I10 Essential (primary) hypertension: Secondary | ICD-10-CM

## 2013-05-13 DIAGNOSIS — I6350 Cerebral infarction due to unspecified occlusion or stenosis of unspecified cerebral artery: Secondary | ICD-10-CM

## 2013-05-13 DIAGNOSIS — I4892 Unspecified atrial flutter: Secondary | ICD-10-CM

## 2013-05-13 LAB — HIV ANTIBODY (ROUTINE TESTING W REFLEX): HIV: NONREACTIVE

## 2013-05-13 LAB — COMPREHENSIVE METABOLIC PANEL
ALT: 12 U/L (ref 0–53)
AST: 18 U/L (ref 0–37)
Albumin: 2.5 g/dL — ABNORMAL LOW (ref 3.5–5.2)
Alkaline Phosphatase: 100 U/L (ref 39–117)
BUN: 9 mg/dL (ref 6–23)
CHLORIDE: 98 meq/L (ref 96–112)
CO2: 24 mEq/L (ref 19–32)
Calcium: 8.9 mg/dL (ref 8.4–10.5)
Creatinine, Ser: 0.61 mg/dL (ref 0.50–1.35)
GFR calc Af Amer: >90 mL/min (ref >90)
GFR calc non Af Amer: 89 mL/min — ABNORMAL LOW (ref >90)
GLUCOSE: 95 mg/dL (ref 70–99)
Potassium: 3.8 mEq/L (ref 3.7–5.3)
Sodium: 135 mEq/L — ABNORMAL LOW (ref 137–147)
Total Bilirubin: 0.9 mg/dL (ref 0.3–1.2)
Total Protein: 6.7 g/dL (ref 6.0–8.3)

## 2013-05-13 LAB — HEMOGLOBIN A1C
Hgb A1c MFr Bld: 6.1 % — ABNORMAL HIGH (ref <5.7)
Mean Plasma Glucose: 128 mg/dL — ABNORMAL HIGH (ref <117)

## 2013-05-13 LAB — CBC
HCT: 36.5 % — ABNORMAL LOW (ref 39.0–52.0)
Hemoglobin: 12.1 g/dL — ABNORMAL LOW (ref 13.0–17.0)
MCH: 29 pg (ref 26.0–34.0)
MCHC: 33.2 g/dL (ref 30.0–36.0)
MCV: 87.5 fL (ref 78.0–100.0)
Platelets: 253 10*3/uL (ref 150–400)
RBC: 4.17 MIL/uL — ABNORMAL LOW (ref 4.22–5.81)
RDW: 16.7 % — ABNORMAL HIGH (ref 11.5–15.5)
WBC: 12.2 10*3/uL — AB (ref 4.0–10.5)

## 2013-05-13 LAB — URINE CULTURE
Colony Count: NO GROWTH
Culture: NO GROWTH

## 2013-05-13 LAB — LACTIC ACID, PLASMA: LACTIC ACID, VENOUS: 1.4 mmol/L (ref 0.5–2.2)

## 2013-05-13 LAB — INFLUENZA PANEL BY PCR (TYPE A & B)
H1N1 flu by pcr: NOT DETECTED
Influenza A By PCR: NEGATIVE
Influenza B By PCR: NEGATIVE

## 2013-05-13 LAB — TSH: TSH: 2.693 u[IU]/mL (ref 0.350–4.500)

## 2013-05-13 LAB — GLUCOSE, CAPILLARY: Glucose-Capillary: 76 mg/dL (ref 70–99)

## 2013-05-13 MED ORDER — GUAIFENESIN-DM 100-10 MG/5ML PO SYRP
5.0000 mL | ORAL_SOLUTION | ORAL | Status: DC | PRN
  Administered 2013-05-13: 5 mL via ORAL
  Filled 2013-05-13: qty 5

## 2013-05-13 MED ORDER — GUAIFENESIN ER 600 MG PO TB12
1200.0000 mg | ORAL_TABLET | Freq: Two times a day (BID) | ORAL | Status: DC
  Administered 2013-05-13 – 2013-05-16 (×7): 1200 mg via ORAL
  Filled 2013-05-13 (×9): qty 2

## 2013-05-13 MED ORDER — SACCHAROMYCES BOULARDII 250 MG PO CAPS
250.0000 mg | ORAL_CAPSULE | Freq: Two times a day (BID) | ORAL | Status: DC
  Administered 2013-05-13 – 2013-05-16 (×7): 250 mg via ORAL
  Filled 2013-05-13 (×9): qty 1

## 2013-05-13 MED ORDER — METOPROLOL TARTRATE 25 MG PO TABS
25.0000 mg | ORAL_TABLET | Freq: Two times a day (BID) | ORAL | Status: DC
  Administered 2013-05-13 – 2013-05-16 (×6): 25 mg via ORAL
  Filled 2013-05-13 (×9): qty 1

## 2013-05-13 NOTE — Progress Notes (Signed)
Patient had some PVC's 21 with 3 beat Vtach. Dr. Hartford Poli aware and orders received.

## 2013-05-13 NOTE — Progress Notes (Signed)
[facility] PROGRESS NOTE   Austin [name] GBT:[id number] DOB: 06/03/1926 DOA: 05/12/2013 PCP: [name], MD  HPI/Subjective: Complaining about a lot of cough, has a hoarse voice  Assessment/Plan: Principal Problem:   Severe sepsis Active Problems:   C. difficile diarrhea   Protein-calorie malnutrition, severe   Delirium   Fever   Lactic acidosis   Atrial fibrillation with RVR   Hypernatremia   Severe sepsis/ Fever/ Lactic acidosis:  - I am concern about Aspiration PNA as he has been complaining of coughing with eating.  - Check a swallowing evaluation. We'll cover him with vancomycin, Rocephin and Flagyl, (as treatment for HCAP )as he has C. Dif. Skin is intact.  -Blood cultures pending, influenza PCR is negative.  - Cont IV fluid for 24 hrs monitor I and O's.  - Will get Orthopedic to evaluate weight bearing.   C. difficile diarrhea:  - Cont vancomycin orally.  - He cont to have diarrhea.   Hyponatremia:  -Appears to be chronic, low normal with sodium 135. - Recheck in am.   Protein-calorie malnutrition, severe  - Ensure TID nutrition consult.   Delirium:  - Haldol PRN   Atrial fibrillation with RVR:  - Resume home meds, adding metoprolol 25 mg twice a day. On Xarelto. - metoprolol IV for a HR.  History of CVA -Recent history of CVA, likely secondary to atrial fibrillation. Patient is on Xarelto.  Code Status: Full code Family Communication: Plan discussed with the patient. Disposition Plan: Remains inpatient   Consultants:  None  Procedures:  None  Antibiotics:  None   Objective: Filed Vitals:   05/13/13 1042  BP:   Pulse: 84  Temp:   Resp:     Intake/Output Summary (Last 24 hours) at 05/13/13 1139 Last data filed at 05/13/13 0700  Gross per 24 hour  Intake   1203 ml  Output    850 ml  Net    353 ml   Filed Weights   05/12/13 1709  Weight: 101.606 kg (224 lb)    Exam: General: Alert and awake, oriented x3,  not in any acute distress. HEENT: anicteric sclera, pupils reactive to light and accommodation, EOMI CVS: S1-S2 clear, no murmur rubs or gallops Chest: clear to auscultation bilaterally, no wheezing, rales or rhonchi Abdomen: soft nontender, nondistended, normal bowel sounds, no organomegaly Extremities: no cyanosis, clubbing or edema noted bilaterally Neuro: Cranial nerves II-XII intact, no focal neurological deficits  Data Reviewed: Basic Metabolic Panel:  Recent Labs Lab 05/07/13 0410 05/12/13 1344 05/13/13 0540  NA 136* 135* 135*  K 3.5* 4.5 3.8  CL 99 98 98  CO2 23 24 24   GLUCOSE 98 121* 95  BUN 3* 12 9  CREATININE 0.53 0.62 0.61  CALCIUM 8.4 9.0 8.9   Liver Function Tests:  Recent Labs Lab 05/12/13 1344 05/13/13 0540  AST 22 18  ALT 16 12  ALKPHOS 105 100  BILITOT 0.5 0.9  PROT 6.8 6.7  ALBUMIN 2.5* 2.5*   No results found for this basename: LIPASE, AMYLASE,  in the last 168 hours No results found for this basename: AMMONIA,  in the last 168 hours CBC:  Recent Labs Lab 05/12/13 1344 05/13/13 0540  WBC 13.6* 12.2*  NEUTROABS 11.3*  --   HGB 12.2* 12.1*  HCT 35.8* 36.5*  MCV 86.3 87.5  PLT 252 253   Cardiac Enzymes: No results found for this basename: CKTOTAL, CKMB, CKMBINDEX, TROPONINI,  in the last 168 hours BNP (last 3 results)  Recent Labs  05/03/13 1319  PROBNP 622.4*   CBG: No results found for this basename: GLUCAP,  in the last 168 hours  Micro Recent Results (from the past 240 hour(s))  CULTURE, BLOOD (ROUTINE X 2)     Status: None   Collection Time    05/03/13  3:05 PM      Result Value Range Status   Specimen Description BLOOD ARM RIGHT   Final   Special Requests BOTTLES DRAWN AEROBIC ONLY 5CC   Final   Culture  Setup Time     Final   Value: 05/03/2013 20:19     Performed at [facility]   Culture     Final   Value: NO GROWTH 5 DAYS     Performed at [facility]   Report Status 05/09/2013 FINAL   Final    CULTURE, BLOOD (ROUTINE X 2)     Status: None   Collection Time    05/03/13  4:00 PM      Result Value Range Status   Specimen Description BLOOD LEFT FOREARM   Final   Special Requests BOTTLES DRAWN AEROBIC AND ANAEROBIC 5CC   Final   Culture  Setup Time     Final   Value: 05/03/2013 21:11     Performed at [facility]   Culture     Final   Value: NO GROWTH 5 DAYS     Performed at [facility]   Report Status 05/09/2013 FINAL   Final  CLOSTRIDIUM DIFFICILE BY PCR     Status: Abnormal   Collection Time    05/03/13  8:20 PM      Result Value Range Status   C difficile by pcr POSITIVE (*) NEGATIVE Final   Comment: CRITICAL RESULT CALLED TO, READ BACK BY AND VERIFIED WITH:     Tracie Harrier RN 12:40 05/04/13 ([email or web address])  MRSA PCR SCREENING     Status: None   Collection Time    05/04/13  7:08 AM      Result Value Range Status   MRSA by PCR NEGATIVE  NEGATIVE Final   Comment:            The GeneXpert MRSA Assay (FDA     approved for NASAL specimens     only), is one component of a     comprehensive MRSA colonization     surveillance program. It is not     intended to diagnose MRSA     infection nor to guide or     monitor treatment for     MRSA infections.     Studies: Dg Chest Port 1 View  05/12/2013   CLINICAL DATA:  Cough and congestion .  EXAM: PORTABLE CHEST - 1 VIEW  COMPARISON:  Chest x-ray 05/03/2013.  Chest x-ray 01/26/2013.  FINDINGS: Persistent atelectasis left upper lobe. Stable chronic interstitial changes consistent with interstitial fibrosis. No pleural effusion or pneumothorax. Stable cardiomegaly.  IMPRESSION: 1. Persistent subsegmental atelectasis left upper lobe. 2. Stable pulmonary interstitial fibrosis. 3. Stable cardiomegaly.   Electronically Signed   By: [place]   On: 05/12/2013 14:12    Scheduled Meds: . antiseptic oral rinse  15 mL Mouth Rinse BID  . buPROPion  100 mg Oral Daily  . cefTRIAXone (ROCEPHIN)  IV  1 g Intravenous Q24H   . cholecalciferol  1,000 Units Oral Daily  . digoxin  0.125 mg Oral Daily  . ferrous sulfate  325 mg Oral Q breakfast  .  FLUoxetine  10 mg Oral Daily  . levothyroxine  75 mcg Oral QAC breakfast  . metronidazole  500 mg Intravenous Q8H  . multivitamin with minerals  1 tablet Oral Daily  . potassium chloride SA  20 mEq Oral Daily  . Rivaroxaban  20 mg Oral q1800  . senna  1 tablet Oral BID  . simvastatin  10 mg Oral q1800  . sodium chloride  3 mL Intravenous Q12H  . vancomycin  125 mg Oral Q6H   Continuous Infusions: . sodium chloride 100 mL/hr at 05/13/13 0440       Time spent: 35 minutes    [facility]  [facility] Pager [phone] If 7PM-7AM, please contact night-coverage at [email or web address], password [facility] 05/13/2013, 11:39 AM  LOS: 1 day

## 2013-05-13 NOTE — Progress Notes (Signed)
SLP Cancellation Note  Patient Details Name: [name] MRN: [id number] DOB: 1926/07/01   Cancelled treatment: ST received order for BSE.  Evaluation deferred to 05/14/13.  Sharman Crate [place], [facility] 05/13/2013, 6:09 PM

## 2013-05-13 NOTE — Progress Notes (Signed)
When encouraged to deep breathe and cough, pt coughed up a moderate amount of thick, brown sputum.

## 2013-05-14 DIAGNOSIS — D62 Acute posthemorrhagic anemia: Secondary | ICD-10-CM

## 2013-05-14 DIAGNOSIS — I714 Abdominal aortic aneurysm, without rupture, unspecified: Secondary | ICD-10-CM

## 2013-05-14 DIAGNOSIS — J690 Pneumonitis due to inhalation of food and vomit: Secondary | ICD-10-CM

## 2013-05-14 LAB — BASIC METABOLIC PANEL
BUN: 12 mg/dL (ref 6–23)
CO2: 22 meq/L (ref 19–32)
CREATININE: 0.62 mg/dL (ref 0.50–1.35)
Calcium: 8.8 mg/dL (ref 8.4–10.5)
Chloride: 98 mEq/L (ref 96–112)
GFR calc Af Amer: >90 mL/min (ref >90)
GFR calc non Af Amer: 89 mL/min — ABNORMAL LOW (ref >90)
GLUCOSE: 86 mg/dL (ref 70–99)
Potassium: 5 mEq/L (ref 3.7–5.3)
Sodium: 134 mEq/L — ABNORMAL LOW (ref 137–147)

## 2013-05-14 LAB — CBC
HCT: 36.4 % — ABNORMAL LOW (ref 39.0–52.0)
Hemoglobin: 12.4 g/dL — ABNORMAL LOW (ref 13.0–17.0)
MCH: 29.7 pg (ref 26.0–34.0)
MCHC: 34.1 g/dL (ref 30.0–36.0)
MCV: 87.1 fL (ref 78.0–100.0)
PLATELETS: 243 10*3/uL (ref 150–400)
RBC: 4.18 MIL/uL — ABNORMAL LOW (ref 4.22–5.81)
RDW: 17.1 % — AB (ref 11.5–15.5)
WBC: 11.4 10*3/uL — AB (ref 4.0–10.5)

## 2013-05-14 LAB — LEGIONELLA ANTIGEN, URINE: LEGIONELLA ANTIGEN, URINE: NEGATIVE

## 2013-05-14 MED ORDER — BOOST / RESOURCE BREEZE PO LIQD
1.0000 | Freq: Three times a day (TID) | ORAL | Status: DC
  Administered 2013-05-14 – 2013-05-16 (×5): 1 via ORAL

## 2013-05-14 NOTE — Evaluation (Signed)
Clinical/Bedside Swallow Evaluation Patient Details  Name: JODI KAPPES MRN: [id number] Date of Birth: 1926/08/14  Today's Date: 05/14/2013 Time: 1696-7893 SLP Time Calculation (min): 14 min  Past Medical History:  Past Medical History  Diagnosis Date  . Hypertension   . Hyperlipidemia   . AAA (abdominal aortic aneurysm)   . Arthritis   . CAD (coronary artery disease)   . Thyroid disease     hypothyroidism  . Dysrhythmia     atrial flutter  . Shortness of breath     a little with exertion  . Polio 1934  . Atrial flutter     chronic anticoagulation  . COPD (chronic obstructive pulmonary disease)   . History of nuclear stress test 04/2010    dipyridamole; small, mostly fixed basal to mid inferior and inferoseptal defect (gut artifact v. scar); post stress EF 62%; non-diagnostic for ischemia; low risk    Past Surgical History:  Past Surgical History  Procedure Laterality Date  . Abdominal aortic aneurysm repair  05/21/2010    aorto bi-iliac [facility]  . Appendectomy  1949  . Popliteal synovial cyst excision Left   . Joint replacement      Knee and Hip replacements  . Total hip arthroplasty Left 1994  . Knee arthroplasty Left 2004  . Total hip revision Left 12/04/2012    Procedure: LEFT TOTAL HIP REVISION;  Surgeon: Mauri Pole, MD;  Location: [facility];  Service: Orthopedics;  Laterality: Left;  . Cardiac catheterization  2009    in [place]; 50-60% RCA  . Transthoracic echocardiogram  04/2010    EF =>55%; RV borderline dilated; LA mildly dilated; mild mitral annular calcif & mild MR; mild TR; mild calcif of AV leaflets with mild AV stenosis; aortic root sclerosis/calcif  . Orif femur fracture Left 01/22/2013    Procedure: OPEN REDUCTION INTERNAL FIXATION (ORIF) DISTAL FEMUR FRACTURE;  Surgeon: Mauri Pole, MD;  Location: [place];  Service: Orthopedics;  Laterality: Left;   HPI:  80 year old male with past medical history of atrial flutter on skeletal hospitalized in October for  femur fracture not weightbearing discharge to skilled nursing facility came back to the hospital for health care associated pneumonia and C. Difficile on 05/07/2013 on vancomycin by mouth and Levaquin. He relates he's been having difficulties with eating as he has been coughing. Per daughter 24 hours prior to admission he started getting confused to the point where this morning he was hallucinating and not recognizing them. She took her temperature at home and it was 101. Concern for aspiration pna per chart.    Assessment / Plan / Recommendation Clinical Impression  Pt recently seen by SLP with similar findings. Questionable wet vocal quality and family report of occasional choking. Otherwise pt appears WNL. Given second admission with pna and question of dysphagia, recommend objective test to determine if pt may be silently aspirating. Will plan for test tomorrow. Pt agrees.     Aspiration Risk  Mild    Diet Recommendation Regular;Thin liquid   Liquid Administration via: Cup;Straw Medication Administration: Whole meds with puree Supervision: Patient able to self feed;Staff to assist with self feeding Compensations: Slow rate;Small sips/bites Postural Changes and/or Swallow Maneuvers: Seated upright 90 degrees    Other  Recommendations Recommended Consults: MBS Oral Care Recommendations: Oral care BID   Follow Up Recommendations  Other (comment) (TBd after MBS)    Frequency and Duration        Pertinent Vitals/Pain NA    SLP Swallow Goals  Swallow Study Prior Functional Status       General HPI: 80 year old male with past medical history of atrial flutter on skeletal hospitalized in October for femur fracture not weightbearing discharge to skilled nursing facility came back to the hospital for health care associated pneumonia and C. Difficile on 05/07/2013 on vancomycin by mouth and Levaquin. He relates he's been having difficulties with eating as he has been coughing. Per  daughter 24 hours prior to admission he started getting confused to the point where this morning he was hallucinating and not recognizing them. She took her temperature at home and it was 101. Concern for aspiration pna per chart.  Type of Study: Bedside swallow evaluation Previous Swallow Assessment: BSE 05/04/13 Diet Prior to this Study: Regular;Thin liquids Temperature Spikes Noted: No Respiratory Status: Nasal cannula History of Recent Intubation: No Behavior/Cognition: Alert;Cooperative Oral Cavity - Dentition: Missing dentition Self-Feeding Abilities: Able to feed self Patient Positioning: Upright in bed Baseline Vocal Quality: Hoarse Volitional Cough: Weak Volitional Swallow: Able to elicit    Oral/Motor/Sensory Function Overall Oral Motor/Sensory Function: Appears within functional limits for tasks assessed   Ice Chips Ice chips: Not tested   Thin Liquid Thin Liquid: Impaired Presentation: Cup;Straw;Self Fed Pharyngeal  Phase Impairments: Wet Vocal Quality    Nectar Thick Nectar Thick Liquid: Not tested   Honey Thick Honey Thick Liquid: Not tested   Puree Puree: Within functional limits   Solid   GO    Solid: Within functional limits      Herbie Baltimore, MA CCC-SLP [phone]  Esperansa Sarabia, Katherene Ponto 05/14/2013,2:52 PM

## 2013-05-14 NOTE — Progress Notes (Signed)
[facility] PROGRESS NOTE   Austin [name] WSF:[id number] DOB: 1926/08/22 DOA: 05/12/2013 PCP: [name], MD  HPI/Subjective: Does not complain.  Still with no voice.  Very weak  Assessment/Plan: Principal Problem:   Severe sepsis Active Problems:   Aspiration pneumonia   C. difficile diarrhea   Protein-calorie malnutrition, severe   Delirium   Fever   Lactic acidosis   Atrial fibrillation with RVR   Hypernatremia   Severe sepsis/ Fever/ Lactic acidosis:  - concerned about Aspiration PNA as he has previously complained of coughing with eating.  - swallowing evaluation pending. - On IV rocephin and IV flagyl.  He is improving - so we will leave the coverage as it is. - Blood cultures pending, influenza PCR is negative.  - Cont IV fluid for 24 hrs monitor I and O's.  - Will get Orthopedic to evaluate weight bearing.   C. difficile diarrhea:  - Cont vancomycin orally.  - He reports that he doesn't know if he's had diarrhea.  Will ask nursing to track.  Hyponatremia:  -Appears to be chronic, low normal with sodium 135. -stable.  Recheck in am.   Protein-calorie malnutrition, severe  - Ensure TID nutrition consult  Delirium:  - Haldol PRN   Atrial fibrillation with RVR:  - Resume home meds, adding metoprolol 25 mg twice a day.  - On Xarelto. - metoprolol IV PRN  History of CVA -Recent history of CVA, likely secondary to atrial fibrillation. Patient is on Xarelto.  Code Status: Full code Family Communication: Plan discussed with the patient. Disposition Plan: Remains inpatient   Consultants:  None  Procedures:  None  Antibiotics:  None   Objective: Filed Vitals:   05/14/13 1001  BP: 124/63  Pulse: 58  Temp:   Resp:     Intake/Output Summary (Last 24 hours) at 05/14/13 1159 Last data filed at 05/14/13 0602  Gross per 24 hour  Intake    240 ml  Output    250 ml  Net    -10 ml   Filed Weights   05/12/13 1709  Weight:  101.606 kg (224 lb)    Exam: General: Alert and awake, oriented x3, not in any acute distress.  Voice is gone.  Patient whispers.  He appears weak HEENT: anicteric sclera, pupils reactive to light and accommodation, EOMI CVS: S1-S2 clear, no murmur rubs or gallops Chest: course breath sounds, unable to clear with cough. Abdomen: soft nontender, nondistended, normal bowel sounds, no organomegaly Extremities: no cyanosis, clubbing or edema noted bilaterally Neuro: Cranial nerves II-XII intact, no focal neurological deficits  Data Reviewed: Basic Metabolic Panel:  Recent Labs Lab 05/12/13 1344 05/13/13 0540 05/14/13 0503  NA 135* 135* 134*  K 4.5 3.8 5.0  CL 98 98 98  CO2 24 24 22   GLUCOSE 121* 95 86  BUN 12 9 12   CREATININE 0.62 0.61 0.62  CALCIUM 9.0 8.9 8.8   Liver Function Tests:  Recent Labs Lab 05/12/13 1344 05/13/13 0540  AST 22 18  ALT 16 12  ALKPHOS 105 100  BILITOT 0.5 0.9  PROT 6.8 6.7  ALBUMIN 2.5* 2.5*   CBC:  Recent Labs Lab 05/12/13 1344 05/13/13 0540 05/14/13 0503  WBC 13.6* 12.2* 11.4*  NEUTROABS 11.3*  --   --   HGB 12.2* 12.1* 12.4*  HCT 35.8* 36.5* 36.4*  MCV 86.3 87.5 87.1  PLT 252 253 243   BNP (last 3 results)  Recent Labs  05/03/13 1319  PROBNP 622.4*  CBG:  Recent Labs Lab 05/13/13 1729  GLUCAP 76    Micro Recent Results (from the past 240 hour(s))  CULTURE, BLOOD (ROUTINE X 2)     Status: None   Collection Time    05/12/13  1:31 PM      Result Value Range Status   Specimen Description BLOOD RIGHT ARM   Final   Special Requests BOTTLES DRAWN AEROBIC AND ANAEROBIC 5CC   Final   Culture  Setup Time     Final   Value: 05/12/2013 22:09     Performed at [facility]   Culture     Final   Value:        BLOOD CULTURE RECEIVED NO GROWTH TO DATE CULTURE WILL BE HELD FOR 5 DAYS BEFORE ISSUING A FINAL NEGATIVE REPORT     Performed at [facility]   Report Status PENDING   Incomplete  CULTURE, BLOOD  (ROUTINE X 2)     Status: None   Collection Time    05/12/13  1:42 PM      Result Value Range Status   Specimen Description BLOOD RIGHT HAND   Final   Special Requests BOTTLES DRAWN AEROBIC AND ANAEROBIC 10CC   Final   Culture  Setup Time     Final   Value: 05/12/2013 22:09     Performed at [facility]   Culture     Final   Value:        BLOOD CULTURE RECEIVED NO GROWTH TO DATE CULTURE WILL BE HELD FOR 5 DAYS BEFORE ISSUING A FINAL NEGATIVE REPORT     Performed at [facility]   Report Status PENDING   Incomplete  URINE CULTURE     Status: None   Collection Time    05/12/13  2:14 PM      Result Value Range Status   Specimen Description URINE, CATHETERIZED   Final   Special Requests NONE   Final   Culture  Setup Time     Final   Value: 05/12/2013 22:13     Performed at [place]     Final   Value: NO GROWTH     Performed at [facility]   Culture     Final   Value: NO GROWTH     Performed at [facility]   Report Status 05/13/2013 FINAL   Final  CULTURE, BLOOD (ROUTINE X 2)     Status: None   Collection Time    05/12/13  6:55 PM      Result Value Range Status   Specimen Description BLOOD RIGHT ARM   Final   Special Requests BOTTLES DRAWN AEROBIC AND ANAEROBIC [facility]   Final   Culture  Setup Time     Final   Value: 05/13/2013 05:37     Performed at [facility]   Culture     Final   Value:        BLOOD CULTURE RECEIVED NO GROWTH TO DATE CULTURE WILL BE HELD FOR 5 DAYS BEFORE ISSUING A FINAL NEGATIVE REPORT     Performed at [facility]   Report Status PENDING   Incomplete  CULTURE, BLOOD (ROUTINE X 2)     Status: None   Collection Time    05/12/13  7:00 PM      Result Value Range Status   Specimen Description BLOOD RIGHT HAND   Final   Special Requests BOTTLES DRAWN  AEROBIC ONLY 10CC   Final   Culture  Setup Time     Final   Value: 05/13/2013 05:37     Performed at [facility]   Culture      Final   Value:        BLOOD CULTURE RECEIVED NO GROWTH TO DATE CULTURE WILL BE HELD FOR 5 DAYS BEFORE ISSUING A FINAL NEGATIVE REPORT     Performed at [facility]   Report Status PENDING   Incomplete     Studies: Dg Chest Port 1 View  05/12/2013   CLINICAL DATA:  Cough and congestion .  EXAM: PORTABLE CHEST - 1 VIEW  COMPARISON:  Chest x-ray 05/03/2013.  Chest x-ray 01/26/2013.  FINDINGS: Persistent atelectasis left upper lobe. Stable chronic interstitial changes consistent with interstitial fibrosis. No pleural effusion or pneumothorax. Stable cardiomegaly.  IMPRESSION: 1. Persistent subsegmental atelectasis left upper lobe. 2. Stable pulmonary interstitial fibrosis. 3. Stable cardiomegaly.   Electronically Signed   By: [place]   On: 05/12/2013 14:12    Scheduled Meds: . antiseptic oral rinse  15 mL Mouth Rinse BID  . buPROPion  100 mg Oral Daily  . cefTRIAXone (ROCEPHIN)  IV  1 g Intravenous Q24H  . cholecalciferol  1,000 Units Oral Daily  . digoxin  0.125 mg Oral Daily  . ferrous sulfate  325 mg Oral Q breakfast  . FLUoxetine  10 mg Oral Daily  . guaiFENesin  1,200 mg Oral BID  . levothyroxine  75 mcg Oral QAC breakfast  . metoprolol tartrate  25 mg Oral BID  . metronidazole  500 mg Intravenous Q8H  . multivitamin with minerals  1 tablet Oral Daily  . Rivaroxaban  20 mg Oral q1800  . saccharomyces boulardii  250 mg Oral BID  . simvastatin  10 mg Oral q1800  . sodium chloride  3 mL Intravenous Q12H  . vancomycin  125 mg Oral Q6H   Continuous Infusions:       Time spent: 35 minutes  Karen Kitchens  [facility] Pager [phone] If 7PM-7AM, please contact night-coverage at [email or web address], password [facility] 05/14/2013, 11:59 AM  LOS: 2 days

## 2013-05-14 NOTE — Clinical Social Work Psychosocial (Signed)
Clinical Social Work Department BRIEF PSYCHOSOCIAL ASSESSMENT 05/14/2013  Patient:  Austin [name], Austin [name]     Account Number:  [id number]     Admit date:  05/12/2013  Clinical Social Worker:  Lovey Newcomer  Date/Time:  05/14/2013 03:30 PM  Referred by:  Physician  Date Referred:  05/14/2013 Referred for  SNF Placement   Other Referral:   Interview type:  Family Other interview type:   Patient and family were interviewed at bedside.    PSYCHOSOCIAL DATA Living Status:  FAMILY Admitted from facility:   Level of care:   Primary support name:  Nunzio Cory Primary support relationship to patient:  CHILD, ADULT Degree of support available:   Support is strong.    CURRENT CONCERNS Current Concerns  Post-Acute Placement   Other Concerns:    SOCIAL WORK ASSESSMENT / PLAN CSW met with patient and his two children at bedside to discuss disposition. Patient is actually admitted from home with daughter. Patient had a SNF stay at [facility], but has been home for a few days before being admitted to hospital. Patient states that he does NOT want to return to a SNF. Daughter and son are in agreement and would like for patient to return home with the daughter who provides 24/7 care for patient. Daughter and son were very vocal about their opinion that the patient was discharged too early during last hospitalization. Daughter and son are afraid that this will happen during this hospitalization. CSW stated that MD will determine when patient is medically stable for DC. Daughter and son seem to be under the impression that patient will remain in hospital until patient's medical problems are completely resolved. CSW explained that family needs to voice their concerns to MD. Daughter states she will be at hospital during the time of unit's progression and hopes that she can speak with MD after progression. Daughter states that the family had a bad experience with a SNF in the past when their  mother passed away. Patient states that he has [facility] and plans to use this agency when he returns to his daughter's. RNCM notified. CSW will sign off at this time.   Assessment/plan status:  No Further Intervention Required Other assessment/ plan:   NONE   Information/referral to community resources:   CSW contact information given to family.    PATIENT'S/FAMILY'S RESPONSE TO PLAN OF CARE: Patient and children are NOT agreeable to SNF placement at DC. They state that patient will DC to daughter's residence with Home. Patient and children were engaged, pleasant, and appreciative of CSW contact. Family plans to discuss concerns with MD. CSW signing off.       Liz Beach, [place], [place], [phone]

## 2013-05-14 NOTE — Progress Notes (Signed)
INITIAL NUTRITION ASSESSMENT  DOCUMENTATION CODES Per approved criteria  -Severe malnutrition in the context of chronic illness   INTERVENTION: Agree with probiotic. Pt does not like Ensure Complete, switch to [employer] - which pt will take. Diet texture and liquid consistency per SLP; recommend removal of any diet restrictions (i.e. Heart Healthy) to help with palatability of meals. RD to continue to follow nutrition care plan.  NUTRITION DIAGNOSIS: Inadequate oral intake related to poor appetite as evidenced by ongoing weight loss.   Goal: Intake to meet >90% of estimated nutrition needs.  Monitor:  weight trends, lab trends, I/O's, PO intake, supplement tolerance  Reason for Assessment: Malnutrition Screening Tool  80 y.o. male  Admitting Dx: Severe sepsis  ASSESSMENT: PMHx significant for HTN, HLD, AAA, hypothyroidism, COPD. Recent admission 2/2 femur fx, C diff colitis, and HCAP. Admitted with delirium, tachycardia and fever. Work-up reveals c diff and severe sepsis.  Per family, pt has had ongoing coughing with eating, MD suspects possible aspiration PNA. SLP evaluation pending at this time.  Noted that this RD saw this pt 10 days ago during previous hospitalization. Family reported during that admission that the patient has had a 40 lb weight loss while staying at the SNF 2/2 pt disliked the food offered there. Per pt family, the pt had been on appetite stimulant medication but it did not help with his appetite. Of note, pt has lost an additional 15 lb since last admission! Pt reports that he does not like the food here. Does not like Ensure, but is familiar with Gwyneth Revels and will drink it.   Pt is currently on Florastor.  Nutrition Focused Physical Exam:   Subcutaneous Fat:  Orbital Region: Mild depletion  Upper Arm Region: Severe depletion  Thoracic and Lumbar Region: N/A   Muscle:  Temple Region: Severe depletion  Clavicle Bone Region: Mild depletion   Clavicle and Acromion Bone Region: N/A  Scapular Bone Region: N/A  Dorsal Hand: Mild depletion  Patellar Region: WNL  Anterior Thigh Region: Mild depletion  Posterior Calf Region: Severe depletion   Edema: none  Pt meets criteria for severe MALNUTRITION in the context of chronic illness as evidenced by severe fat and muscle mass loss and 22% wt loss x 5 months.   Height: Ht Readings from Last 1 Encounters:  05/12/13 6\' 5"  (1.956 m)    Weight: Wt Readings from Last 1 Encounters:  05/12/13 224 lb (101.606 kg)    Ideal Body Weight: 208 lb  % Ideal Body Weight: 108%  Wt Readings from Last 10 Encounters:  05/12/13 224 lb (101.606 kg)  05/03/13 239 lb 3.2 oz (108.5 kg)  01/20/13 287 lb 14.7 oz (130.6 kg)  01/20/13 287 lb 14.7 oz (130.6 kg)  12/13/12 280 lb (127.007 kg)  12/06/12 314 lb 8 oz (142.656 kg)  12/06/12 314 lb 8 oz (142.656 kg)  11/24/12 287 lb (130.182 kg)  11/15/12 289 lb (131.09 kg)  10/23/12 289 lb (131.09 kg)    Usual Body Weight: 287 lb  % Usual Body Weight: 78%  BMI:  Body mass index is 26.56 kg/(m^2). Overweight  Estimated Nutritional Needs: Kcal: 2100 - 2300 Protein: 100 - 110 g Fluid: 2.1 - 2.3 liters daily  Skin: stage II pressure ulcer  Diet Order: Cardiac  EDUCATION NEEDS: -No education needs identified at this time   Intake/Output Summary (Last 24 hours) at 05/14/13 1218 Last data filed at 05/14/13 0602  Gross per 24 hour  Intake    240 ml  Output    250 ml  Net    -10 ml    Last BM: 1/25  Labs:   Recent Labs Lab 05/12/13 1344 05/13/13 0540 05/14/13 0503  NA 135* 135* 134*  K 4.5 3.8 5.0  CL 98 98 98  CO2 24 24 22   BUN 12 9 12   CREATININE 0.62 0.61 0.62  CALCIUM 9.0 8.9 8.8  GLUCOSE 121* 95 86    CBG (last 3)   Recent Labs  05/13/13 1729  GLUCAP 76    Scheduled Meds: . antiseptic oral rinse  15 mL Mouth Rinse BID  . buPROPion  100 mg Oral Daily  . cefTRIAXone (ROCEPHIN)  IV  1 g Intravenous Q24H  .  cholecalciferol  1,000 Units Oral Daily  . digoxin  0.125 mg Oral Daily  . ferrous sulfate  325 mg Oral Q breakfast  . FLUoxetine  10 mg Oral Daily  . guaiFENesin  1,200 mg Oral BID  . levothyroxine  75 mcg Oral QAC breakfast  . metoprolol tartrate  25 mg Oral BID  . metronidazole  500 mg Intravenous Q8H  . multivitamin with minerals  1 tablet Oral Daily  . Rivaroxaban  20 mg Oral q1800  . saccharomyces boulardii  250 mg Oral BID  . simvastatin  10 mg Oral q1800  . sodium chloride  3 mL Intravenous Q12H  . vancomycin  125 mg Oral Q6H    Continuous Infusions:   Past Medical History  Diagnosis Date  . Hypertension   . Hyperlipidemia   . AAA (abdominal aortic aneurysm)   . Arthritis   . CAD (coronary artery disease)   . Thyroid disease     hypothyroidism  . Dysrhythmia     atrial flutter  . Shortness of breath     a little with exertion  . Polio 1934  . Atrial flutter     chronic anticoagulation  . COPD (chronic obstructive pulmonary disease)   . History of nuclear stress test 04/2010    dipyridamole; small, mostly fixed basal to mid inferior and inferoseptal defect (gut artifact v. scar); post stress EF 62%; non-diagnostic for ischemia; low risk     Past Surgical History  Procedure Laterality Date  . Abdominal aortic aneurysm repair  05/21/2010    aorto bi-iliac [facility]  . Appendectomy  1949  . Popliteal synovial cyst excision Left   . Joint replacement      Knee and Hip replacements  . Total hip arthroplasty Left 1994  . Knee arthroplasty Left 2004  . Total hip revision Left 12/04/2012    Procedure: LEFT TOTAL HIP REVISION;  Surgeon: Mauri Pole, MD;  Location: [facility];  Service: Orthopedics;  Laterality: Left;  . Cardiac catheterization  2009    in [place]; 50-60% RCA  . Transthoracic echocardiogram  04/2010    EF =>55%; RV borderline dilated; LA mildly dilated; mild mitral annular calcif & mild MR; mild TR; mild calcif of AV leaflets with mild AV stenosis; aortic root  sclerosis/calcif  . Orif femur fracture Left 01/22/2013    Procedure: OPEN REDUCTION INTERNAL FIXATION (ORIF) DISTAL FEMUR FRACTURE;  Surgeon: Mauri Pole, MD;  Location: [place];  Service: Orthopedics;  Laterality: Left;    Inda Coke MS, RD, LDN Pager: [phone] After-hours pager: [phone]

## 2013-05-14 NOTE — Progress Notes (Signed)
Addendum  Patient seen and examined, chart and data base reviewed.  I agree with the above assessment and plan.  For full details please see Mrs. Imogene Burn PA note.  Severe sepsis likely secondary to aspiration pneumonia, patient is on Rocephin and Flagyl.  C. difficile diarrhea patient is on oral vancomycin and IV Flagyl.   Birdie Hopes, MD [facility] Pager: [phone] 05/14/2013, 4:18 PM

## 2013-05-15 ENCOUNTER — Inpatient Hospital Stay (HOSPITAL_COMMUNITY): Payer: Medicare Other

## 2013-05-15 LAB — CBC
HCT: 35.5 % — ABNORMAL LOW (ref 39.0–52.0)
Hemoglobin: 12.1 g/dL — ABNORMAL LOW (ref 13.0–17.0)
MCH: 29.5 pg (ref 26.0–34.0)
MCHC: 34.1 g/dL (ref 30.0–36.0)
MCV: 86.6 fL (ref 78.0–100.0)
Platelets: 234 10*3/uL (ref 150–400)
RBC: 4.1 MIL/uL — ABNORMAL LOW (ref 4.22–5.81)
RDW: 16.8 % — AB (ref 11.5–15.5)
WBC: 11.3 10*3/uL — ABNORMAL HIGH (ref 4.0–10.5)

## 2013-05-15 LAB — BASIC METABOLIC PANEL
BUN: 12 mg/dL (ref 6–23)
CALCIUM: 8.4 mg/dL (ref 8.4–10.5)
CO2: 25 mEq/L (ref 19–32)
CREATININE: 0.64 mg/dL (ref 0.50–1.35)
Chloride: 100 mEq/L (ref 96–112)
GFR, EST NON AFRICAN AMERICAN: 87 mL/min — AB (ref >90)
Glucose, Bld: 101 mg/dL — ABNORMAL HIGH (ref 70–99)
Potassium: 3.9 mEq/L (ref 3.7–5.3)
Sodium: 137 mEq/L (ref 137–147)

## 2013-05-15 LAB — GLUCOSE, CAPILLARY: Glucose-Capillary: 128 mg/dL — ABNORMAL HIGH (ref 70–99)

## 2013-05-15 MED ORDER — POTASSIUM CHLORIDE CRYS ER 20 MEQ PO TBCR
40.0000 meq | EXTENDED_RELEASE_TABLET | Freq: Once | ORAL | Status: AC
  Administered 2013-05-15: 40 meq via ORAL
  Filled 2013-05-15: qty 2

## 2013-05-15 MED ORDER — FUROSEMIDE 10 MG/ML IJ SOLN
40.0000 mg | Freq: Once | INTRAMUSCULAR | Status: AC
  Administered 2013-05-15: 40 mg via INTRAVENOUS
  Filled 2013-05-15: qty 4

## 2013-05-15 NOTE — Evaluation (Signed)
Physical Therapy Evaluation Patient Details Name: Austin [name] MRN: [id number] DOB: 11-28-1926 Today's Date: 05/15/2013 Time: 7829-5621 PT Time Calculation (min): 28 min  PT Assessment / Plan / Recommendation History of Present Illness  80 year old male with past medical history of atrial flutter on skeletal hospitalized in October for her femur fracture not weightbearing discharge to skilled nursing facility came back to the hospital for health care associated pneumonia and C. Difficile on 05/07/2013 on vancomycin by mouth and Levaquin. He relates he's been having difficulties with eating as he has been coughing. Per daughter 24 hours prior to admission he started getting confused to the point where this morning he was hallucinating and not recognizing them. She took her temperature at home and it was 101 said he decided to bring her to the ED. He relates no new rashes, chest pain falls or nausea vomiting. He continues to have frequent amounts of loose stools.  Clinical Impression  Pt admitted with sepsis likely as a result of PNA. Pt currently with functional limitations due to the deficits listed below (see PT Problem List).  Pt will benefit from skilled PT to increase their independence and safety with mobility to allow discharge to the venue listed below. Pt's daughter reports all equipment and necessary assistance is already set up at home.  Pt's daughter adamant for pt to d/c home.     PT Assessment  Patient needs continued PT services    Follow Up Recommendations  Home health PT;Supervision/Assistance - 24 hour    Does the patient have the potential to tolerate intense rehabilitation      Barriers to Discharge        Equipment Recommendations  None recommended by PT    Recommendations for Other Services     Frequency Min 3X/week    Precautions / Restrictions Precautions Precautions: Fall Restrictions Weight Bearing Restrictions: Yes LLE Weight Bearing: Non weight  bearing Other Position/Activity Restrictions: per daughter and pt. report from recent L hip ORIF   Pertinent Vitals/Pain Pt denied any pain      Mobility  Bed Mobility Overal bed mobility: Needs Assistance Bed Mobility: Supine to Sit;Sit to Supine Supine to sit: Min assist;HOB elevated Sit to supine: Mod assist General bed mobility comments: overall min A to sit EOB with cues for sequencing; mod A to return to supine for head and LE management.  Total A to scoot to [facility] Transfers Overall transfer level:  (not tested; pt. not transfering currently at home) Equipment used: None General transfer comment: pt still NWB to LLE therefore no transfers attempted    Exercises General Exercises - Lower Extremity Long Arc Quad: AROM;10 reps;Left;Seated Heel Slides: AROM;Left;10 reps;Supine Hip ABduction/ADduction: AROM;10 reps;Left;Seated Hip Flexion/Marching: AROM;10 reps;Left;Seated   PT Diagnosis: Difficulty walking;Generalized weakness  PT Problem List: Decreased strength;Decreased activity tolerance;Decreased mobility;Decreased knowledge of use of DME;Decreased safety awareness;Decreased knowledge of precautions PT Treatment Interventions: DME instruction;Gait training;Functional mobility training;Therapeutic activities;Therapeutic exercise;Balance training;Patient/family education     PT Goals(Current goals can be found in the care plan section) Acute Rehab PT Goals Patient Stated Goal: go home PT Goal Formulation: With patient Time For Goal Achievement: 05/22/13 Potential to Achieve Goals: Good  Visit Information  Last PT Received On: 05/15/13 Assistance Needed: +1 (+2 for OOB) History of Present Illness: 80 year old male with past medical history of atrial flutter on skeletal hospitalized in October for her femur fracture not weightbearing discharge to skilled nursing facility came back to the hospital for health care associated pneumonia and C.  Difficile on 05/07/2013 on  vancomycin by mouth and Levaquin. He relates he's been having difficulties with eating as he has been coughing. Per daughter 24 hours prior to admission he started getting confused to the point where this morning he was hallucinating and not recognizing them. She took her temperature at home and it was 101 said he decided to bring her to the ED. He relates no new rashes, chest pain falls or nausea vomiting. He continues to have frequent amounts of loose stools.       Prior Zena expects to be discharged to:: Private residence Living Arrangements: Children Available Help at Discharge: Available 24 hours/day Type of Home: House Home Access: Stairs to enter [employer] of Steps: 1 Home Layout: Two level;Able to live on main level with bedroom/bathroom Home Equipment: Hospital bed;Wheelchair - [employer] - 2 wheels;Cane - single point Optician, dispensing) Prior Function Level of Independence: Needs assistance Gait / Transfers Assistance Needed: since L hip ORIF pt nonambulatory and family using hoyer lift for transfers from bed to w/c or chair. ADL's / Homemaking Assistance Needed: total A needed Communication / Swallowing Assistance Needed: WFL; see SLP notes Comments: pt. ready to begin ambulating once pt. able to begin weightbearing; anticipate may happen soon as pt. had scheduled MD appt 1/29 with Dr. Alvan Dame. Communication Communication: No difficulties Dominant Hand: Right    Cognition  Cognition Arousal/Alertness: Awake/alert Behavior During Therapy: WFL for tasks assessed/performed Overall Cognitive Status: Within Functional Limits for tasks assessed    Extremity/Trunk Assessment Upper Extremity Assessment Upper Extremity Assessment: Overall WFL for tasks assessed Lower Extremity Assessment Lower Extremity Assessment: Generalized weakness (difficult to assess due to L hip ORIF)   Balance Balance Overall balance assessment: History of  Falls;Modified Independent Sitting-balance support: No upper extremity supported;Feet supported Sitting balance-Leahy Scale: Good  End of Session PT - End of Session Equipment Utilized During Treatment: Oxygen Activity Tolerance: Patient tolerated treatment well Patient left: in bed;with call bell/phone within reach;with nursing/sitter in room Nurse Communication: Need for lift equipment;Mobility status  GP     Siri Cole 05/15/2013, 11:44 AM  Laureen Abrahams, PT, DPT [phone]

## 2013-05-15 NOTE — Progress Notes (Signed)
[facility] PROGRESS NOTE  DELVECCHIO MADOLE FGH:[id number] DOB: Jan 21, 1927 DOA: 05/12/2013 PCP: [name], MD  HPI/Subjective: Austin [name] is a 80 yr old male with a PMH of HTN, hyperlipidemia, atrial flutter, and femur fracture in October.  Pt was d/c and came back to hospital for HCAP and C. Diff on 05/07/2013.  Pt has been placed on Vancomycin, Levaquin and Flagyl.  Prior to admission, according to daughter, pt started getting confused and febrile (101).    Pt is much better today from reading the reports.  Pt is awake and very oriented and is answering questions and talking.  Pt still appears short of breath.  Denies and chest pain and abdominal pain.    Assessment/Plan: Severe Sepsis/Fever/Lactic Acidosis -Likely secondary to Aspiration PNA, previously complained of coughing with eating -Speech therapy plan for objective test on 05/16/13 to determine if pt is aspirating -Flu PCR is negative, Legionella is negative -Blood Cx is showing no growth to date -Physical Therapy evaluated pt on 05/15/13.  They recommend PT 3 x a week and continued PT.  There are no equipment recommendations. -Pt is improving, continue IV rocephin and IV Flagyl  C. Difficile diarrhea -Continue oral Vancomycin -Pt is not complaining of any irregular bowel movements -Continue to monitor clinically  Hyponatremia -Appears chronic -Pt current Na level is 137 and is stable -Continue to monitor.  Re-check BMP tomorrow AM  Protein-calorie malnutrition, severe -Inadequate oral intake related to poor appetite as evidenced by ongoing weight loss -Nutrition was consulted, recommend probiotic and removal of any diet restrictions -Speech therapy recommends dysphagia 3 diet with a thin liquids. -Switch Ensure complete to Resource Breeze  Delirium -Likely secondary to sepsis and malnutrition -Appears to have clinically resolved -d/c haldol -Continue to monitor pt for change in baseline  Atrial  fibrillation with RVR -Pt is currently on Digoxin, Metroprolol IV PRN and BID -Continue Pt on Xarelto  Hx of CVA -Pt has a recent Hx of CVA, likely secondary to a fib -Continue pt on Xarelto      DVT Prophylaxis:  Pt is on Xarelto due to PMH of a fib and CVA  Code Status: Full Code Family Communication: Plan discussed with both patient and daughter Disposition Plan: Remain Inpatient likely to be discharged in the morning.   Consultants:  None  Procedures:  None  Antibiotics:  IV Rocephin  IV Flagyl  PO Vancocin  Objective: Filed Vitals:   05/14/13 1001 05/14/13 1346 05/14/13 2010 05/15/13 0533  BP: 124/63 132/77 117/66 118/70  Pulse: 58 65 96 59  Temp:  97.4 F (36.3 C) 97.8 F (36.6 C) 98.3 F (36.8 C)  TempSrc:  Oral Oral Oral  Resp:  20 20 18   Height:      Weight:      SpO2:  96% 95% 94%    Intake/Output Summary (Last 24 hours) at 05/15/13 1256 Last data filed at 05/15/13 0912  Gross per 24 hour  Intake    970 ml  Output   1201 ml  Net   -231 ml   Filed Weights   05/12/13 1709  Weight: 101.606 kg (224 lb)    Exam: General: Alert and awake, oriented x4, not in any acute distress.  Pt whispers but is regaining his voice  HEENT:  PERRLA, EOMI, Anicteic Sclera, Neck: Supple, no JVD, no masses, no cervical lymphadenopathy  Cardiovascular:S1 S2 auscultated, no rubs, murmurs or gallops.   Respiratory: Course breath sounds heard b/l.  Unable to clear with cough.  Short breaths noted  Abdomen: Soft, nontender, nondistended, + bowel sounds  Extremities: warm dry without cyanosis clubbing or edema b/l. Neuro:Cranial nerves grossly intact. Strength 5/5 in upper and lower extremities.  Sensation intact in upper and lower extremities    Psych: Normal affect and demeanor with intact judgement and insight.  Very friendly demeanor   Data Reviewed: Basic Metabolic Panel:  Recent Labs Lab 05/12/13 1344 05/13/13 0540 05/14/13 0503 05/15/13 0634  NA  135* 135* 134* 137  K 4.5 3.8 5.0 3.9  CL 98 98 98 100  CO2 24 24 22 25   GLUCOSE 121* 95 86 101*  BUN 12 9 12 12   CREATININE 0.62 0.61 0.62 0.64  CALCIUM 9.0 8.9 8.8 8.4   Liver Function Tests:  Recent Labs Lab 05/12/13 1344 05/13/13 0540  AST 22 18  ALT 16 12  ALKPHOS 105 100  BILITOT 0.5 0.9  PROT 6.8 6.7  ALBUMIN 2.5* 2.5*   CBC:  Recent Labs Lab 05/12/13 1344 05/13/13 0540 05/14/13 0503 05/15/13 0634  WBC 13.6* 12.2* 11.4* 11.3*  NEUTROABS 11.3*  --   --   --   HGB 12.2* 12.1* 12.4* 12.1*  HCT 35.8* 36.5* 36.4* 35.5*  MCV 86.3 87.5 87.1 86.6  PLT 252 253 243 234  BNP (last 3 results)  Recent Labs  05/03/13 1319  PROBNP 622.4*   CBG:  Recent Labs Lab 05/13/13 1729  GLUCAP 76    Recent Results (from the past 240 hour(s))  CULTURE, BLOOD (ROUTINE X 2)     Status: None   Collection Time    05/12/13  1:31 PM      Result Value Range Status   Specimen Description BLOOD RIGHT ARM   Final   Special Requests BOTTLES DRAWN AEROBIC AND ANAEROBIC 5CC   Final   Culture  Setup Time     Final   Value: 05/12/2013 22:09     Performed at [facility]   Culture     Final   Value:        BLOOD CULTURE RECEIVED NO GROWTH TO DATE CULTURE WILL BE HELD FOR 5 DAYS BEFORE ISSUING A FINAL NEGATIVE REPORT     Performed at [facility]   Report Status PENDING   Incomplete  CULTURE, BLOOD (ROUTINE X 2)     Status: None   Collection Time    05/12/13  1:42 PM      Result Value Range Status   Specimen Description BLOOD RIGHT HAND   Final   Special Requests BOTTLES DRAWN AEROBIC AND ANAEROBIC 10CC   Final   Culture  Setup Time     Final   Value: 05/12/2013 22:09     Performed at [facility]   Culture     Final   Value:        BLOOD CULTURE RECEIVED NO GROWTH TO DATE CULTURE WILL BE HELD FOR 5 DAYS BEFORE ISSUING A FINAL NEGATIVE REPORT     Performed at [facility]   Report Status PENDING   Incomplete  URINE CULTURE     Status: None    Collection Time    05/12/13  2:14 PM      Result Value Range Status   Specimen Description URINE, CATHETERIZED   Final   Special Requests NONE   Final   Culture  Setup Time     Final   Value: 05/12/2013 22:13     Performed at [facility]  Count     Final   Value: NO GROWTH     Performed at [facility]   Culture     Final   Value: NO GROWTH     Performed at [facility]   Report Status 05/13/2013 FINAL   Final  CULTURE, BLOOD (ROUTINE X 2)     Status: None   Collection Time    05/12/13  6:55 PM      Result Value Range Status   Specimen Description BLOOD RIGHT ARM   Final   Special Requests BOTTLES DRAWN AEROBIC AND ANAEROBIC [facility]   Final   Culture  Setup Time     Final   Value: 05/13/2013 05:37     Performed at [facility]   Culture     Final   Value:        BLOOD CULTURE RECEIVED NO GROWTH TO DATE CULTURE WILL BE HELD FOR 5 DAYS BEFORE ISSUING A FINAL NEGATIVE REPORT     Performed at [facility]   Report Status PENDING   Incomplete  CULTURE, BLOOD (ROUTINE X 2)     Status: None   Collection Time    05/12/13  7:00 PM      Result Value Range Status   Specimen Description BLOOD RIGHT HAND   Final   Special Requests BOTTLES DRAWN AEROBIC ONLY 10CC   Final   Culture  Setup Time     Final   Value: 05/13/2013 05:37     Performed at [facility]   Culture     Final   Value:        BLOOD CULTURE RECEIVED NO GROWTH TO DATE CULTURE WILL BE HELD FOR 5 DAYS BEFORE ISSUING A FINAL NEGATIVE REPORT     Performed at [facility]   Report Status PENDING   Incomplete     Studies: Dg Swallowing Func-speech Pathology  05/15/2013   Katherene Ponto [place], [place]     05/15/2013 10:58 AM Objective Swallowing Evaluation: Modified Barium Swallowing Study   Patient Details  Name: Austin [name] MRN: [id number] Date of Birth: August 09, 1926  Today's Date: 05/15/2013 Time: 1015-1040 SLP Time Calculation (min): 25 min  Past Medical  History:  Past Medical History  Diagnosis Date  . Hypertension   . Hyperlipidemia   . AAA (abdominal aortic aneurysm)   . Arthritis   . CAD (coronary artery disease)   . Thyroid disease     hypothyroidism  . Dysrhythmia     atrial flutter  . Shortness of breath     a little with exertion  . Polio 1934  . Atrial flutter     chronic anticoagulation  . COPD (chronic obstructive pulmonary disease)   . History of nuclear stress test 04/2010    dipyridamole; small, mostly fixed basal to mid inferior and  inferoseptal defect (gut artifact v. scar); post stress EF 62%;  non-diagnostic for ischemia; low risk    Past Surgical History:  Past Surgical History  Procedure Laterality Date  . Abdominal aortic aneurysm repair  05/21/2010    aorto bi-iliac [facility]  . Appendectomy  1949  . Popliteal synovial cyst excision Left   . Joint replacement      Knee and Hip replacements  . Total hip arthroplasty Left 1994  . Knee arthroplasty Left 2004  . Total hip revision Left 12/04/2012    Procedure: LEFT TOTAL HIP REVISION;  Surgeon: Mauri Pole,  MD;  Location: [facility];  Service: Orthopedics;  Laterality: Left;  . Cardiac catheterization  2009    in [place]; 50-60% RCA  . Transthoracic echocardiogram  04/2010    EF =>55%; RV borderline dilated; LA mildly dilated; mild mitral  annular calcif & mild MR; mild TR; mild calcif of AV leaflets  with mild AV stenosis; aortic root sclerosis/calcif  . Orif femur fracture Left 01/22/2013    Procedure: OPEN REDUCTION INTERNAL FIXATION (ORIF) DISTAL FEMUR  FRACTURE;  Surgeon: Mauri Pole, MD;  Location: [facility];   Service: Orthopedics;  Laterality: Left;   HPI:  80 year old male with past medical history of atrial flutter on  skeletal hospitalized in October for femur fracture not  weightbearing discharge to skilled nursing facility came back to  the hospital for health care associated pneumonia and C.  Difficile on 05/07/2013 on vancomycin by mouth and Levaquin. He  relates he's been having difficulties with  eating as he has been  coughing. Per daughter 24 hours prior to admission he started  getting confused to the point where this morning he was  hallucinating and not recognizing them. She took her temperature  at home and it was 101. Concern for aspiration pna per chart.      Assessment / Plan / Recommendation Clinical Impression  Dysphagia Diagnosis: Moderate pharyngeal phase dysphagia Clinical impression: Pt presents with a moderate motor based  oropharyngeal dysphagia with decreased strength for pharyngeal  peristalsis. Pts base of tongue, pharyngeal contraction and  hyolaryngeal mechanism are insufficent to squeeze bolus fully  through pharynx. There are moderate residuals pooling in  valleculae and pyriform sinuses that result in penetrate which pt  typically senses and exples with a cough (despite vocal quality,  laryngeal sensation good and cough effective).  A chin tuck is  effective to increase stripping mechanism and reduce residue. Pt  is also encouraged to swallow twice. Will downgrade diet to a dys  3 (mechanical soft) diet to soften/moisten foods, with thin  liquids. Recommend f/u with a home health SLP if he returns home.   Pt would benefit from CIR consult.    Treatment Recommendation  Therapy as outlined in treatment plan below    Diet Recommendation Dysphagia 3 (Mechanical Soft);Thin liquid   Liquid Administration via: Cup;No straw Medication Administration: Whole meds with liquid Supervision: Patient able to self feed Compensations: Slow rate;Small sips/bites;Multiple dry swallows  after each bite/sip Postural Changes and/or Swallow Maneuvers: Chin tuck;Seated  upright 90 degrees    Other  Recommendations Oral Care Recommendations: Oral care BID   Follow Up Recommendations  Inpatient Rehab    Frequency and Duration min 2x/week  2 weeks   Pertinent Vitals/Pain NA    SLP Swallow Goals     General HPI: 80 year old male with past medical history of atrial  flutter on skeletal hospitalized in October  for femur fracture  not weightbearing discharge to skilled nursing facility came back  to the hospital for health care associated pneumonia and C.  Difficile on 05/07/2013 on vancomycin by mouth and Levaquin. He  relates he's been having difficulties with eating as he has been  coughing. Per daughter 24 hours prior to admission he started  getting confused to the point where this morning he was  hallucinating and not recognizing them. She took her temperature  at home and it was 101. Concern for aspiration pna per chart.  Type of Study: Modified Barium Swallowing Study Reason for Referral: Objectively evaluate swallowing function Previous Swallow Assessment: BSE 05/04/13 Diet Prior to  this Study: Regular;Thin liquids Temperature Spikes Noted: No Respiratory Status: Nasal cannula History of Recent Intubation: No Behavior/Cognition: Alert;Cooperative Oral Cavity - Dentition: Missing dentition Oral Motor / Sensory Function: Within functional limits Self-Feeding Abilities: Able to feed self Patient Positioning: Upright in chair Baseline Vocal Quality: Hoarse Volitional Cough: Strong Volitional Swallow: Able to elicit Anatomy: Within functional limits Pharyngeal Secretions: Not observed secondary MBS    Reason for Referral Objectively evaluate swallowing function   Oral Phase Oral Preparation/Oral Phase Oral Phase: WFL   Pharyngeal Phase Pharyngeal Phase Pharyngeal Phase: Impaired Pharyngeal - Thin Pharyngeal - Thin Cup: Moderate aspiration;Penetration/Aspiration  before swallow;Penetration/Aspiration after swallow;Reduced  pharyngeal peristalsis;Reduced epiglottic inversion;Reduced  anterior laryngeal mobility;Reduced laryngeal elevation;Reduced  airway/laryngeal closure;Reduced tongue base  retraction;Pharyngeal residue - valleculae;Pharyngeal residue -  pyriform sinuses;Compensatory strategies attempted (Comment)  (chin tuck. Pt only aspirated when asked to take large sip) Penetration/Aspiration details (thin cup):  Material enters  airway, passes BELOW cords then ejected out;Material enters  airway, CONTACTS cords then ejected out;Material does not enter  airway Pharyngeal - Solids Pharyngeal - Puree: Reduced pharyngeal peristalsis;Reduced  epiglottic inversion;Reduced anterior laryngeal mobility;Reduced  laryngeal elevation;Reduced airway/laryngeal closure;Reduced  tongue base retraction;Pharyngeal residue - valleculae;Pharyngeal  residue - pyriform sinuses (chin tuck) Pharyngeal - Mechanical Soft: Reduced pharyngeal  peristalsis;Reduced epiglottic inversion;Reduced anterior  laryngeal mobility;Reduced laryngeal elevation;Reduced  airway/laryngeal closure;Reduced tongue base  retraction;Pharyngeal residue - valleculae;Pharyngeal residue -  pyriform sinuses (chin tuck) Pharyngeal - Pill: Reduced pharyngeal peristalsis;Reduced  epiglottic inversion;Reduced anterior laryngeal mobility;Reduced  laryngeal elevation;Reduced airway/laryngeal closure;Reduced  tongue base retraction  Cervical Esophageal Phase    GO    Cervical Esophageal Phase Cervical Esophageal Phase: [facility]        Herbie Baltimore, MA CCC-SLP [phone]  [place], Katherene Ponto 05/15/2013, 10:54 AM     Scheduled Meds: . antiseptic oral rinse  15 mL Mouth Rinse BID  . buPROPion  100 mg Oral Daily  . cefTRIAXone (ROCEPHIN)  IV  1 g Intravenous Q24H  . cholecalciferol  1,000 Units Oral Daily  . digoxin  0.125 mg Oral Daily  . feeding supplement (RESOURCE BREEZE)  1 Container Oral TID BM  . ferrous sulfate  325 mg Oral Q breakfast  . FLUoxetine  10 mg Oral Daily  . guaiFENesin  1,200 mg Oral BID  . levothyroxine  75 mcg Oral QAC breakfast  . metoprolol tartrate  25 mg Oral BID  . metronidazole  500 mg Intravenous Q8H  . multivitamin with minerals  1 tablet Oral Daily  . Rivaroxaban  20 mg Oral q1800  . saccharomyces boulardii  250 mg Oral BID  . simvastatin  10 mg Oral q1800  . sodium chloride  3 mL Intravenous Q12H  . vancomycin  125 mg Oral Q6H    Continuous Infusions:   Principal Problem:   Severe sepsis Active Problems:   C. difficile diarrhea   Protein-calorie malnutrition, severe   Delirium   Fever   Lactic acidosis   Atrial fibrillation with RVR   Hypernatremia   Aspiration pneumonia    Cameron Proud PA-S [facility] Pager [phone]. If 7PM-7AM, please contact night-coverage at [email or web address], password [facility] 05/15/2013, 12:56 PM  LOS: 3 days    Addendum  Patient seen and examined, chart and data base reviewed.  I agree with the above assessment and plan.  For full details please see Mr. Cameron Proud PA-S note.  I reviewed and addended the above note as needed.   Birdie Hopes, MD [facility] Pager: [phone] 05/15/2013,  2:01 PM

## 2013-05-15 NOTE — Progress Notes (Signed)
UR completed. Antavious Spanos RN CCM Case Mgmt 

## 2013-05-15 NOTE — Care Management Note (Signed)
    Page 1 of 2   05/16/2013     1:40:36 PM   CARE MANAGEMENT NOTE 05/16/2013  Patient:  Austin [name], Austin [name]   Account Number:  [id number]  Date Initiated:  05/15/2013  Documentation initiated by:  Tomi Bamberger  Subjective/Objective Assessment:   dx sever sepsis  admit- from home, active with [facility] for RN, Pt, Ot.     Action/Plan:   Anticipated DC Date:  05/16/2013   Anticipated DC Plan:  [place]  CM consult      [facility] Choice  HOME HEALTH   Choice offered to / List presented to:  C-4 Adult Children        HH arranged  HH-1 RN  [place] agency  [employer]   Status of service:  Completed, signed off Medicare Important Message given?   (If response is "NO", the following Medicare IM given date fields will be blank) Date Medicare IM given:   Date Additional Medicare IM given:    Discharge Disposition:  [place]  Per UR Regulation:  Reviewed for med. necessity/level of care/duration of stay  If discussed at [place] of Stay Meetings, dates discussed:    Comments:  05/16/13 13:39 Tomi Bamberger RN BSN [phone] patient for dc today, Stanton Kidney with [facility] notified. Soc will begin 24-48 hrs post dc.  05/15/13 16:05 Tomi Bamberger RN, BSN [phone] patient is from home , [facility], PT, [place],.  Will need to resume, daghter states they would like to continue to work with [facility].  Mary with [facility] notified.  Patient for possible dc tomorrow.

## 2013-05-15 NOTE — Procedures (Signed)
Objective Swallowing Evaluation: Modified Barium Swallowing Study  Patient Details  Name: Austin [name] MRN: [id number] Date of Birth: 1927/02/21  Today's Date: 05/15/2013 Time: 1015-1040 SLP Time Calculation (min): 25 min  Past Medical History:  Past Medical History  Diagnosis Date  . Hypertension   . Hyperlipidemia   . AAA (abdominal aortic aneurysm)   . Arthritis   . CAD (coronary artery disease)   . Thyroid disease     hypothyroidism  . Dysrhythmia     atrial flutter  . Shortness of breath     a little with exertion  . Polio 1934  . Atrial flutter     chronic anticoagulation  . COPD (chronic obstructive pulmonary disease)   . History of nuclear stress test 04/2010    dipyridamole; small, mostly fixed basal to mid inferior and inferoseptal defect (gut artifact v. scar); post stress EF 62%; non-diagnostic for ischemia; low risk    Past Surgical History:  Past Surgical History  Procedure Laterality Date  . Abdominal aortic aneurysm repair  05/21/2010    aorto bi-iliac BPG  . Appendectomy  1949  . Popliteal synovial cyst excision Left   . Joint replacement      Knee and Hip replacements  . Total hip arthroplasty Left 1994  . Knee arthroplasty Left 2004  . Total hip revision Left 12/04/2012    Procedure: LEFT TOTAL HIP REVISION;  Surgeon: Mauri Pole, MD;  Location: [facility];  Service: Orthopedics;  Laterality: Left;  . Cardiac catheterization  2009    in [place]; 50-60% RCA  . Transthoracic echocardiogram  04/2010    EF =>55%; RV borderline dilated; LA mildly dilated; mild mitral annular calcif & mild MR; mild TR; mild calcif of AV leaflets with mild AV stenosis; aortic root sclerosis/calcif  . Orif femur fracture Left 01/22/2013    Procedure: OPEN REDUCTION INTERNAL FIXATION (ORIF) DISTAL FEMUR FRACTURE;  Surgeon: Mauri Pole, MD;  Location: [place];  Service: Orthopedics;  Laterality: Left;   HPI:  80 year old male with past medical history of atrial flutter on  skeletal hospitalized in October for femur fracture not weightbearing discharge to skilled nursing facility came back to the hospital for health care associated pneumonia and C. Difficile on 05/07/2013 on vancomycin by mouth and Levaquin. He relates he's been having difficulties with eating as he has been coughing. Per daughter 24 hours prior to admission he started getting confused to the point where this morning he was hallucinating and not recognizing them. She took her temperature at home and it was 101. Concern for aspiration pna per chart.      Assessment / Plan / Recommendation Clinical Impression  Dysphagia Diagnosis: Moderate pharyngeal phase dysphagia Clinical impression: Pt presents with a moderate motor based oropharyngeal dysphagia with decreased strength for pharyngeal peristalsis. Pts base of tongue, pharyngeal contraction and hyolaryngeal mechanism are insufficent to squeeze bolus fully through pharynx. There are moderate residuals pooling in valleculae and pyriform sinuses that result in penetrate which pt typically senses and exples with a cough (despite vocal quality, laryngeal sensation good and cough effective).  A chin tuck is effective to increase stripping mechanism and reduce residue. Pt is also encouraged to swallow twice. Will downgrade diet to a dys 3 (mechanical soft) diet to soften/moisten foods, with thin liquids. Recommend f/u with a home health SLP if he returns home.  Pt would benefit from CIR consult.    Treatment Recommendation  Therapy as outlined in treatment plan below  Diet Recommendation Dysphagia 3 (Mechanical Soft);Thin liquid   Liquid Administration via: Cup;No straw Medication Administration: Whole meds with liquid Supervision: Patient able to self feed Compensations: Slow rate;Small sips/bites;Multiple dry swallows after each bite/sip Postural Changes and/or Swallow Maneuvers: Chin tuck;Seated upright 90 degrees    Other  Recommendations Oral Care  Recommendations: Oral care BID   Follow Up Recommendations  Inpatient Rehab    Frequency and Duration min 2x/week  2 weeks   Pertinent Vitals/Pain NA    SLP Swallow Goals     General HPI: 80 year old male with past medical history of atrial flutter on skeletal hospitalized in October for femur fracture not weightbearing discharge to skilled nursing facility came back to the hospital for health care associated pneumonia and C. Difficile on 05/07/2013 on vancomycin by mouth and Levaquin. He relates he's been having difficulties with eating as he has been coughing. Per daughter 24 hours prior to admission he started getting confused to the point where this morning he was hallucinating and not recognizing them. She took her temperature at home and it was 101. Concern for aspiration pna per chart.  Type of Study: Modified Barium Swallowing Study Reason for Referral: Objectively evaluate swallowing function Previous Swallow Assessment: BSE 05/04/13 Diet Prior to this Study: Regular;Thin liquids Temperature Spikes Noted: No Respiratory Status: Nasal cannula History of Recent Intubation: No Behavior/Cognition: Alert;Cooperative Oral Cavity - Dentition: Missing dentition Oral Motor / Sensory Function: Within functional limits Self-Feeding Abilities: Able to feed self Patient Positioning: Upright in chair Baseline Vocal Quality: Hoarse Volitional Cough: Strong Volitional Swallow: Able to elicit Anatomy: Within functional limits Pharyngeal Secretions: Not observed secondary MBS    Reason for Referral Objectively evaluate swallowing function   Oral Phase Oral Preparation/Oral Phase Oral Phase: WFL   Pharyngeal Phase Pharyngeal Phase Pharyngeal Phase: Impaired Pharyngeal - Thin Pharyngeal - Thin Cup: Moderate aspiration;Penetration/Aspiration before swallow;Penetration/Aspiration after swallow;Reduced pharyngeal peristalsis;Reduced epiglottic inversion;Reduced anterior laryngeal  mobility;Reduced laryngeal elevation;Reduced airway/laryngeal closure;Reduced tongue base retraction;Pharyngeal residue - valleculae;Pharyngeal residue - pyriform sinuses;Compensatory strategies attempted (Comment) (chin tuck. Pt only aspirated when asked to take large sip) Penetration/Aspiration details (thin cup): Material enters airway, passes BELOW cords then ejected out;Material enters airway, CONTACTS cords then ejected out;Material does not enter airway Pharyngeal - Solids Pharyngeal - Puree: Reduced pharyngeal peristalsis;Reduced epiglottic inversion;Reduced anterior laryngeal mobility;Reduced laryngeal elevation;Reduced airway/laryngeal closure;Reduced tongue base retraction;Pharyngeal residue - valleculae;Pharyngeal residue - pyriform sinuses (chin tuck) Pharyngeal - Mechanical Soft: Reduced pharyngeal peristalsis;Reduced epiglottic inversion;Reduced anterior laryngeal mobility;Reduced laryngeal elevation;Reduced airway/laryngeal closure;Reduced tongue base retraction;Pharyngeal residue - valleculae;Pharyngeal residue - pyriform sinuses (chin tuck) Pharyngeal - Pill: Reduced pharyngeal peristalsis;Reduced epiglottic inversion;Reduced anterior laryngeal mobility;Reduced laryngeal elevation;Reduced airway/laryngeal closure;Reduced tongue base retraction  Cervical Esophageal Phase    GO    Cervical Esophageal Phase Cervical Esophageal Phase: [facility]        Herbie Baltimore, MA CCC-SLP [phone]  Lynann Beaver 05/15/2013, 10:54 AM

## 2013-05-16 ENCOUNTER — Inpatient Hospital Stay (HOSPITAL_COMMUNITY): Payer: Medicare Other

## 2013-05-16 LAB — BASIC METABOLIC PANEL
BUN: 12 mg/dL (ref 6–23)
CO2: 23 mEq/L (ref 19–32)
Calcium: 8.5 mg/dL (ref 8.4–10.5)
Chloride: 101 mEq/L (ref 96–112)
Creatinine, Ser: 0.64 mg/dL (ref 0.50–1.35)
GFR calc Af Amer: >90 mL/min (ref >90)
GFR, EST NON AFRICAN AMERICAN: 87 mL/min — AB (ref >90)
Glucose, Bld: 98 mg/dL (ref 70–99)
Potassium: 3.8 mEq/L (ref 3.7–5.3)
SODIUM: 138 meq/L (ref 137–147)

## 2013-05-16 MED ORDER — VANCOMYCIN HCL 125 MG PO CAPS: 125.0000 mg | ORAL_CAPSULE | Freq: Four times a day (QID) | ORAL | Status: DC

## 2013-05-16 MED ORDER — ENSURE COMPLETE PO LIQD: 237.0000 mL | Freq: Two times a day (BID) | ORAL | Status: DC

## 2013-05-16 MED ORDER — AMOXICILLIN-POT CLAVULANATE 875-125 MG PO TABS: 1.0000 | ORAL_TABLET | Freq: Two times a day (BID) | ORAL | Status: DC

## 2013-05-16 MED ORDER — METOPROLOL TARTRATE 25 MG PO TABS: 25.0000 mg | ORAL_TABLET | Freq: Two times a day (BID) | ORAL | Status: AC

## 2013-05-16 MED ORDER — AMOXICILLIN-POT CLAVULANATE 875-125 MG PO TABS
1.0000 | ORAL_TABLET | Freq: Two times a day (BID) | ORAL | Status: DC
  Administered 2013-05-16: 1 via ORAL
  Filled 2013-05-16 (×2): qty 1

## 2013-05-16 NOTE — Progress Notes (Signed)
Nsg Discharge Note  Admit Date:  05/12/2013 Discharge date: 05/16/2013   Warner Mccreedy to be D/C'd Home per MD order.  AVS completed.  Copy for chart, and copy for patient signed, and dated. Patient/caregiver able to verbalize understanding.  Discharge Medication:   Medication List    STOP taking these medications       levofloxacin 500 MG tablet  Commonly known as:  LEVAQUIN      TAKE these medications       albuterol (2.5 MG/3ML) 0.083% nebulizer solution  Commonly known as:  PROVENTIL  Take 2.5 mg by nebulization every 6 (six) hours as needed for wheezing or shortness of breath.     amoxicillin-clavulanate 875-125 MG per tablet  Commonly known as:  AUGMENTIN  Take 1 tablet by mouth every 12 (twelve) hours.     buPROPion 100 MG 12 hr tablet  Commonly known as:  WELLBUTRIN SR  Take 100 mg by mouth daily.     cholecalciferol 1000 UNITS tablet  Commonly known as:  VITAMIN D  Take 1,000 Units by mouth daily.     digoxin 0.125 MG tablet  Commonly known as:  LANOXIN  Take 1 tablet (0.125 mg total) by mouth daily.     feeding supplement (ENSURE COMPLETE) Liqd  Take 237 mLs by mouth 2 (two) times daily between meals.     ferrous sulfate 325 (65 FE) MG tablet  Take 325 mg by mouth daily with breakfast.     FLUoxetine 10 MG capsule  Commonly known as:  PROZAC  Take 1 capsule (10 mg total) by mouth daily.     levothyroxine 75 MCG tablet  Commonly known as:  SYNTHROID, LEVOTHROID  Take 75 mcg by mouth daily before breakfast.     lovastatin 40 MG tablet  Commonly known as:  MEVACOR  Take 40 mg by mouth at bedtime.     metoprolol tartrate 25 MG tablet  Commonly known as:  LOPRESSOR  Take 1 tablet (25 mg total) by mouth 2 (two) times daily.     multivitamin with minerals Tabs tablet  Take 1 tablet by mouth daily.     pantoprazole 40 MG tablet  Commonly known as:  PROTONIX  Take 1 tablet (40 mg total) by mouth daily.     potassium chloride SA 20 MEQ tablet   Commonly known as:  K-DUR,KLOR-CON  Take 20 mEq by mouth daily.     trolamine salicylate 10 % cream  Commonly known as:  ASPERCREME  Apply 1 application topically as needed (for muscle pain).     vancomycin 125 MG capsule  Commonly known as:  VANCOCIN  Take 1 capsule (125 mg total) by mouth 4 (four) times daily. For 14 more days after stopping augmentin     XARELTO 20 MG Tabs tablet  Generic drug:  Rivaroxaban  Take 20 mg by mouth daily.        Discharge Assessment: Filed Vitals:   05/16/13 1242  BP: 104/63  Pulse: 59  Temp: 97.8 F (36.6 C)  Resp: 20   Skin clean, dry and intact without evidence of skin break down, no evidence of skin tears noted. I did not notice any skin breakdown to sacrum area/buttocks.  IV catheter discontinued intact. Site without signs and symptoms of complications - no redness or edema noted at insertion site, patient denies c/o pain - only slight tenderness at site.  Dressing with slight pressure applied.  D/c Instructions-Education: Discharge instructions given to patient/family with verbalized  understanding. D/c education completed with patient/family including follow up instructions, medication list, d/c activities limitations if indicated, with other d/c instructions as indicated by MD - patient able to verbalize understanding, all questions fully answered. Patient instructed to return to ED, call 911, or call MD for any changes in condition.  Patient escorted via EMS to home.   Dayle Points, RN 05/16/2013 3:03 PM

## 2013-05-16 NOTE — Discharge Instructions (Signed)
OK to walk with 50 % weight on left leg with therapy - per [facility] after review of X-rays in hospital

## 2013-05-16 NOTE — Discharge Summary (Addendum)
PATIENT DETAILS Name: Austin [name] Age: 80 y.o. Sex: male Date of Birth: 1927/03/03 MRN: [id number]. Admit Date: 05/12/2013 Admitting Physician: Charlynne Cousins, MD AJO:INOMVEH,MCNOB L, MD  Recommendations for Outpatient Follow-up:  1. Stop Augmentin 4 days from 1/28, extend treatment for C. difficile to 2 weeks from stopping Augmentin 2. Minimize antibiotic use 3. Will need speech therapy followup as outpatient 4. Spoke with orthopedics-Dr. Alvan Dame on 1/28-okay for 50% weightbearing on the left lower extremity. Patient to followup with Dr. Alvan Dame in in one month for repeat x-rays, if continues to do well with good healing, patient will likely be advanced to more weightbearing status  PRIMARY DISCHARGE DIAGNOSIS:  Principal Problem:   Severe sepsis Active Problems:   C. difficile diarrhea   Protein-calorie malnutrition, severe   Delirium   Fever   Lactic acidosis   Atrial fibrillation with RVR   Hypernatremia   Aspiration pneumonia      PAST MEDICAL HISTORY: Past Medical History  Diagnosis Date  . Hypertension   . Hyperlipidemia   . AAA (abdominal aortic aneurysm)   . Arthritis   . CAD (coronary artery disease)   . Thyroid disease     hypothyroidism  . Dysrhythmia     atrial flutter  . Shortness of breath     a little with exertion  . Polio 1934  . Atrial flutter     chronic anticoagulation  . COPD (chronic obstructive pulmonary disease)   . History of nuclear stress test 04/2010    dipyridamole; small, mostly fixed basal to mid inferior and inferoseptal defect (gut artifact v. scar); post stress EF 62%; non-diagnostic for ischemia; low risk     DISCHARGE MEDICATIONS:   Medication List    STOP taking these medications       levofloxacin 500 MG tablet  Commonly known as:  LEVAQUIN      TAKE these medications       albuterol (2.5 MG/3ML) 0.083% nebulizer solution  Commonly known as:  PROVENTIL  Take 2.5 mg by nebulization every 6 (six) hours as  needed for wheezing or shortness of breath.     amoxicillin-clavulanate 875-125 MG per tablet  Commonly known as:  AUGMENTIN  Take 1 tablet by mouth every 12 (twelve) hours.     buPROPion 100 MG 12 hr tablet  Commonly known as:  WELLBUTRIN SR  Take 100 mg by mouth daily.     cholecalciferol 1000 UNITS tablet  Commonly known as:  VITAMIN D  Take 1,000 Units by mouth daily.     digoxin 0.125 MG tablet  Commonly known as:  LANOXIN  Take 1 tablet (0.125 mg total) by mouth daily.     feeding supplement (ENSURE COMPLETE) Liqd  Take 237 mLs by mouth 2 (two) times daily between meals.     ferrous sulfate 325 (65 FE) MG tablet  Take 325 mg by mouth daily with breakfast.     FLUoxetine 10 MG capsule  Commonly known as:  PROZAC  Take 1 capsule (10 mg total) by mouth daily.     levothyroxine 75 MCG tablet  Commonly known as:  SYNTHROID, LEVOTHROID  Take 75 mcg by mouth daily before breakfast.     lovastatin 40 MG tablet  Commonly known as:  MEVACOR  Take 40 mg by mouth at bedtime.     metoprolol tartrate 25 MG tablet  Commonly known as:  LOPRESSOR  Take 1 tablet (25 mg total) by mouth 2 (two) times daily.  multivitamin with minerals Tabs tablet  Take 1 tablet by mouth daily.     pantoprazole 40 MG tablet  Commonly known as:  PROTONIX  Take 1 tablet (40 mg total) by mouth daily.     potassium chloride SA 20 MEQ tablet  Commonly known as:  K-DUR,KLOR-CON  Take 20 mEq by mouth daily.     trolamine salicylate 10 % cream  Commonly known as:  ASPERCREME  Apply 1 application topically as needed (for muscle pain).     vancomycin 125 MG capsule  Commonly known as:  VANCOCIN  Take 1 capsule (125 mg total) by mouth 4 (four) times daily. For 14 more days after stopping augmentin     XARELTO 20 MG Tabs tablet  Generic drug:  Rivaroxaban  Take 20 mg by mouth daily.        ALLERGIES:  No Known Allergies  BRIEF HPI:  See H&P, Labs, Consult and Test reports for all details  in brief, patient is a elderly 80 year old male who was just recently discharged from the hospital, after treatment for hospital-acquired pneumonia and C. difficile colitis, he returned back to the hospital in a few days with fever, shortness of breath and altered mental status. Patient was then admitted for further evaluation and treatment  CONSULTATIONS:   None and orthopedic surgery  PERTINENT RADIOLOGIC STUDIES: Ct Head Wo Contrast  05/03/2013   CLINICAL DATA:  Fever and confusion  EXAM: CT HEAD WITHOUT CONTRAST  TECHNIQUE: Contiguous axial images were obtained from the base of the skull through the vertex without intravenous contrast. Study was obtained within 24 hr of patient's arrival at the emergency department.  COMPARISON:  None.  FINDINGS: There is mild diffuse atrophy. There is a small focus of increased attenuation in the posterior left parafalcine region 3 measuring 1.4 x 1.1 cm, felt 1 to represent a partially calcified meningioma. There is no surrounding edema in this area. There is no other evidence of mass. There is no appreciable acute hemorrhage. There is no extra-axial fluid or midline shift.  There is patchy small vessel disease in the centra semiovale bilaterally. There is decreased attenuation throughout the left pons compared to the right, concerning for recent infarct. No other findings felt to represent potential acute infarct identified.  The bony calvarium appears intact. The mastoid air cells are clear. There is mucosal thickening in the left maxillary antrum.  IMPRESSION: Suspect recent infarct in the left pons.  Atrophy with periventricular small vessel disease.  Small partially calcified meningioma posterior left parafalcine region. No associated edema or mass effect.  Left maxillary sinus disease.   Electronically Signed   By: Lowella Grip M.D.   On: 05/03/2013 13:54   Mr Jodene Nam Head Wo Contrast  05/04/2013   CLINICAL DATA:  Fever and confusion. Possible pontine infarct  on head CT.  EXAM: MRI HEAD WITHOUT CONTRAST  MRA HEAD WITHOUT CONTRAST  TECHNIQUE: Multiplanar, multiecho pulse sequences of the brain and surrounding structures were obtained without intravenous contrast. Angiographic images of the head were obtained using MRA technique without contrast.  COMPARISON:  Head CT 05/03/2013  FINDINGS: MRI HEAD FINDINGS  There is a punctate focus acute infarct involving medial right parietal cortex (series 5, image 28). An additional punctate focus of acute infarct is noted involving the left parietal cortex (series 5, image 29). There is also likely a third punctate focus of acute infarct involving left frontal cortex (series 5, image 30), although this cannot be confirmed on the ADC map  and could be artifactual. Multiple foci of T2 hyperintensity within the subcortical and deep cerebral white matter are nonspecific but compatible with mild chronic small vessel ischemic disease. There is moderate generalized cerebral atrophy. There is no signal abnormality in the pons to correspond to the region of apparent low density on the CT, which was likely artifactual. 1.3 x 0.9 cm left parafalcine mass is seen in the parietal region corresponding to partially calcified lesion described on head CT. There is no intracranial hemorrhage, midline shift, or extra-axial fluid collection. Major intracranial vascular flow voids are unremarkable. Prior left cataract surgery is noted. A small amount of left maxillary sinus fluid is present. There is a small left mastoid effusion.  MRA HEAD FINDINGS  The visualized distal vertebral arteries are patent. Left vertebral artery is slightly dominant, with the right vertebral artery being small distal to the PICA origin. PICA origins are patent bilaterally. AICA and SCA origins are also patent bilaterally. Basilar artery is patent without stenosis. The PCA origins and visualized branches are patent and unremarkable. A right posterior communicating artery is  identified.  Internal carotid arteries are patent from skullbase to carotid terminus. ACA and MCA origins of the branch vessels are patent. Incidental note is made of a median artery of the corpus callosum. No intracranial aneurysm is identified.  IMPRESSION: 1. Three punctate foci of acute infarct involving the bilateral parietal lobes and left frontal lobe. 2. No evidence of pontine infarct. 3. Small left parafalcine extra-axial mass, likely a meningioma. 4. No evidence of major intracranial arterial occlusion or flow limiting stenosis.   Electronically Signed   By: Logan Bores   On: 05/04/2013 13:53   Mr Brain Wo Contrast  05/04/2013   CLINICAL DATA:  Fever and confusion. Possible pontine infarct on head CT.  EXAM: MRI HEAD WITHOUT CONTRAST  MRA HEAD WITHOUT CONTRAST  TECHNIQUE: Multiplanar, multiecho pulse sequences of the brain and surrounding structures were obtained without intravenous contrast. Angiographic images of the head were obtained using MRA technique without contrast.  COMPARISON:  Head CT 05/03/2013  FINDINGS: MRI HEAD FINDINGS  There is a punctate focus acute infarct involving medial right parietal cortex (series 5, image 28). An additional punctate focus of acute infarct is noted involving the left parietal cortex (series 5, image 29). There is also likely a third punctate focus of acute infarct involving left frontal cortex (series 5, image 30), although this cannot be confirmed on the ADC map and could be artifactual. Multiple foci of T2 hyperintensity within the subcortical and deep cerebral white matter are nonspecific but compatible with mild chronic small vessel ischemic disease. There is moderate generalized cerebral atrophy. There is no signal abnormality in the pons to correspond to the region of apparent low density on the CT, which was likely artifactual. 1.3 x 0.9 cm left parafalcine mass is seen in the parietal region corresponding to partially calcified lesion described on head  CT. There is no intracranial hemorrhage, midline shift, or extra-axial fluid collection. Major intracranial vascular flow voids are unremarkable. Prior left cataract surgery is noted. A small amount of left maxillary sinus fluid is present. There is a small left mastoid effusion.  MRA HEAD FINDINGS  The visualized distal vertebral arteries are patent. Left vertebral artery is slightly dominant, with the right vertebral artery being small distal to the PICA origin. PICA origins are patent bilaterally. AICA and SCA origins are also patent bilaterally. Basilar artery is patent without stenosis. The PCA origins and visualized branches are  patent and unremarkable. A right posterior communicating artery is identified.  Internal carotid arteries are patent from skullbase to carotid terminus. ACA and MCA origins of the branch vessels are patent. Incidental note is made of a median artery of the corpus callosum. No intracranial aneurysm is identified.  IMPRESSION: 1. Three punctate foci of acute infarct involving the bilateral parietal lobes and left frontal lobe. 2. No evidence of pontine infarct. 3. Small left parafalcine extra-axial mass, likely a meningioma. 4. No evidence of major intracranial arterial occlusion or flow limiting stenosis.   Electronically Signed   By: Logan Bores   On: 05/04/2013 13:53   Dg Chest Port 1 View  05/12/2013   CLINICAL DATA:  Cough and congestion .  EXAM: PORTABLE CHEST - 1 VIEW  COMPARISON:  Chest x-ray 05/03/2013.  Chest x-ray 01/26/2013.  FINDINGS: Persistent atelectasis left upper lobe. Stable chronic interstitial changes consistent with interstitial fibrosis. No pleural effusion or pneumothorax. Stable cardiomegaly.  IMPRESSION: 1. Persistent subsegmental atelectasis left upper lobe. 2. Stable pulmonary interstitial fibrosis. 3. Stable cardiomegaly.   Electronically Signed   By: [place]   On: 05/12/2013 14:12   Dg Chest Port 1 View  05/03/2013   CLINICAL DATA:  Fever.   EXAM: PORTABLE CHEST - 1 VIEW  COMPARISON:  January 26, 2013.  FINDINGS: Stable cardiomediastinal silhouette. New linear density seen in left perihilar region concerning for subsegmental listhesis or possibly early pneumonia. No pleural effusion or pneumothorax is noted. Stable bilateral interstitial opacities are noted most consistent with scarring.  IMPRESSION: New linear opacity seen in left midlung concerning for subsegmental atelectasis or possibly pneumonia. Followup radiographs are recommended.   Electronically Signed   By: Sabino Dick M.D.   On: 05/03/2013 13:44   Dg Femur Left Port  05/16/2013   CLINICAL DATA:  Pain.  Evaluate for fracture.  EXAM: PORTABLE LEFT FEMUR - 2 VIEW  COMPARISON:  01/22/2013.  FINDINGS: The patient has a left hip arthroplasty device. The hardware components are in anatomic alignment and there is no evidence for dislocation or periprosthetic fracture. There is also a left knee arthroplasty device which appears intact without complications. The comminuted, oblique fracture involving the distal diaphysis of the left femur has been previously reduced with a side plate, screw device and cerclage wire. There is evidence of interval bone remodeling. The fracture line appears less distinct and there is evidence of callus formation.  IMPRESSION: 1. Healing fracture involving the distal shaft of the left femur status post sideplate and screw device reduction. 2. No evidence for new fracture or dislocation.   Electronically Signed   By: Kerby Moors M.D.   On: 05/16/2013 09:55   Dg Swallowing Func-speech Pathology  05/15/2013   Katherene Ponto Deblois, CCC-SLP     05/15/2013 10:58 AM Objective Swallowing Evaluation: Modified Barium Swallowing Study   Patient Details  Name: Austin [name] MRN: [id number] Date of Birth: Sep 04, 1926  Today's Date: 05/15/2013 Time: 1015-1040 SLP Time Calculation (min): 25 min  Past Medical History:  Past Medical History  Diagnosis Date  . Hypertension    . Hyperlipidemia   . AAA (abdominal aortic aneurysm)   . Arthritis   . CAD (coronary artery disease)   . Thyroid disease     hypothyroidism  . Dysrhythmia     atrial flutter  . Shortness of breath     a little with exertion  . Polio 1934  . Atrial flutter     chronic anticoagulation  . COPD (  chronic obstructive pulmonary disease)   . History of nuclear stress test 04/2010    dipyridamole; small, mostly fixed basal to mid inferior and  inferoseptal defect (gut artifact v. scar); post stress EF 62%;  non-diagnostic for ischemia; low risk    Past Surgical History:  Past Surgical History  Procedure Laterality Date  . Abdominal aortic aneurysm repair  05/21/2010    aorto bi-iliac [facility]  . Appendectomy  1949  . Popliteal synovial cyst excision Left   . Joint replacement      Knee and Hip replacements  . Total hip arthroplasty Left 1994  . Knee arthroplasty Left 2004  . Total hip revision Left 12/04/2012    Procedure: LEFT TOTAL HIP REVISION;  Surgeon: Mauri Pole,  MD;  Location: [facility];  Service: Orthopedics;  Laterality: Left;  . Cardiac catheterization  2009    in [place]; 50-60% RCA  . Transthoracic echocardiogram  04/2010    EF =>55%; RV borderline dilated; LA mildly dilated; mild mitral  annular calcif & mild MR; mild TR; mild calcif of AV leaflets  with mild AV stenosis; aortic root sclerosis/calcif  . Orif femur fracture Left 01/22/2013    Procedure: OPEN REDUCTION INTERNAL FIXATION (ORIF) DISTAL FEMUR  FRACTURE;  Surgeon: Mauri Pole, MD;  Location: [facility];   Service: Orthopedics;  Laterality: Left;   HPI:  80 year old male with past medical history of atrial flutter on  skeletal hospitalized in October for femur fracture not  weightbearing discharge to skilled nursing facility came back to  the hospital for health care associated pneumonia and C.  Difficile on 05/07/2013 on vancomycin by mouth and Levaquin. He  relates he's been having difficulties with eating as he has been  coughing. Per daughter 24 hours prior to  admission he started  getting confused to the point where this morning he was  hallucinating and not recognizing them. She took her temperature  at home and it was 101. Concern for aspiration pna per chart.      Assessment / Plan / Recommendation Clinical Impression  Dysphagia Diagnosis: Moderate pharyngeal phase dysphagia Clinical impression: Pt presents with a moderate motor based  oropharyngeal dysphagia with decreased strength for pharyngeal  peristalsis. Pts base of tongue, pharyngeal contraction and  hyolaryngeal mechanism are insufficent to squeeze bolus fully  through pharynx. There are moderate residuals pooling in  valleculae and pyriform sinuses that result in penetrate which pt  typically senses and exples with a cough (despite vocal quality,  laryngeal sensation good and cough effective).  A chin tuck is  effective to increase stripping mechanism and reduce residue. Pt  is also encouraged to swallow twice. Will downgrade diet to a dys  3 (mechanical soft) diet to soften/moisten foods, with thin  liquids. Recommend f/u with a home health SLP if he returns home.   Pt would benefit from CIR consult.    Treatment Recommendation  Therapy as outlined in treatment plan below    Diet Recommendation Dysphagia 3 (Mechanical Soft);Thin liquid   Liquid Administration via: Cup;No straw Medication Administration: Whole meds with liquid Supervision: Patient able to self feed Compensations: Slow rate;Small sips/bites;Multiple dry swallows  after each bite/sip Postural Changes and/or Swallow Maneuvers: Chin tuck;Seated  upright 90 degrees    Other  Recommendations Oral Care Recommendations: Oral care BID   Follow Up Recommendations  Inpatient Rehab    Frequency and Duration min 2x/week  2 weeks   Pertinent Vitals/Pain NA    SLP Swallow Goals  General HPI: 80 year old male with past medical history of atrial  flutter on skeletal hospitalized in October for femur fracture  not weightbearing discharge to skilled nursing  facility came back  to the hospital for health care associated pneumonia and C.  Difficile on 05/07/2013 on vancomycin by mouth and Levaquin. He  relates he's been having difficulties with eating as he has been  coughing. Per daughter 24 hours prior to admission he started  getting confused to the point where this morning he was  hallucinating and not recognizing them. She took her temperature  at home and it was 101. Concern for aspiration pna per chart.  Type of Study: Modified Barium Swallowing Study Reason for Referral: Objectively evaluate swallowing function Previous Swallow Assessment: BSE 05/04/13 Diet Prior to this Study: Regular;Thin liquids Temperature Spikes Noted: No Respiratory Status: Nasal cannula History of Recent Intubation: No Behavior/Cognition: Alert;Cooperative Oral Cavity - Dentition: Missing dentition Oral Motor / Sensory Function: Within functional limits Self-Feeding Abilities: Able to feed self Patient Positioning: Upright in chair Baseline Vocal Quality: Hoarse Volitional Cough: Strong Volitional Swallow: Able to elicit Anatomy: Within functional limits Pharyngeal Secretions: Not observed secondary MBS    Reason for Referral Objectively evaluate swallowing function   Oral Phase Oral Preparation/Oral Phase Oral Phase: WFL   Pharyngeal Phase Pharyngeal Phase Pharyngeal Phase: Impaired Pharyngeal - Thin Pharyngeal - Thin Cup: Moderate aspiration;Penetration/Aspiration  before swallow;Penetration/Aspiration after swallow;Reduced  pharyngeal peristalsis;Reduced epiglottic inversion;Reduced  anterior laryngeal mobility;Reduced laryngeal elevation;Reduced  airway/laryngeal closure;Reduced tongue base  retraction;Pharyngeal residue - valleculae;Pharyngeal residue -  pyriform sinuses;Compensatory strategies attempted (Comment)  (chin tuck. Pt only aspirated when asked to take large sip) Penetration/Aspiration details (thin cup): Material enters  airway, passes BELOW cords then ejected out;Material  enters  airway, CONTACTS cords then ejected out;Material does not enter  airway Pharyngeal - Solids Pharyngeal - Puree: Reduced pharyngeal peristalsis;Reduced  epiglottic inversion;Reduced anterior laryngeal mobility;Reduced  laryngeal elevation;Reduced airway/laryngeal closure;Reduced  tongue base retraction;Pharyngeal residue - valleculae;Pharyngeal  residue - pyriform sinuses (chin tuck) Pharyngeal - Mechanical Soft: Reduced pharyngeal  peristalsis;Reduced epiglottic inversion;Reduced anterior  laryngeal mobility;Reduced laryngeal elevation;Reduced  airway/laryngeal closure;Reduced tongue base  retraction;Pharyngeal residue - valleculae;Pharyngeal residue -  pyriform sinuses (chin tuck) Pharyngeal - Pill: Reduced pharyngeal peristalsis;Reduced  epiglottic inversion;Reduced anterior laryngeal mobility;Reduced  laryngeal elevation;Reduced airway/laryngeal closure;Reduced  tongue base retraction  Cervical Esophageal Phase    GO    Cervical Esophageal Phase Cervical Esophageal Phase: [facility]        Herbie Baltimore, MA CCC-SLP [phone]  Lynann Beaver 05/15/2013, 10:54 AM      PERTINENT LAB RESULTS: CBC:  Recent Labs  05/14/13 0503 05/15/13 0634  WBC 11.4* 11.3*  HGB 12.4* 12.1*  HCT 36.4* 35.5*  PLT 243 234   CMET CMP     Component Value Date/Time   NA 138 05/16/2013 0644   K 3.8 05/16/2013 0644   CL 101 05/16/2013 0644   CO2 23 05/16/2013 0644   GLUCOSE 98 05/16/2013 0644   BUN 12 05/16/2013 0644   CREATININE 0.64 05/16/2013 0644   CALCIUM 8.5 05/16/2013 0644   PROT 6.7 05/13/2013 0540   ALBUMIN 2.5* 05/13/2013 0540   AST 18 05/13/2013 0540   ALT 12 05/13/2013 0540   ALKPHOS 100 05/13/2013 0540   BILITOT 0.9 05/13/2013 0540   GFRNONAA 87* 05/16/2013 0644   GFRAA >90 05/16/2013 0644    GFR Estimated Creatinine Clearance: 86.6 ml/min (by C-G formula based on Cr of 0.64). No results found for this basename: LIPASE,  AMYLASE,  in the last 72 hours No results found for this basename: CKTOTAL,  CKMB, CKMBINDEX, TROPONINI,  in the last 72 hours No components found with this basename: POCBNP,  No results found for this basename: DDIMER,  in the last 72 hours No results found for this basename: HGBA1C,  in the last 72 hours No results found for this basename: CHOL, HDL, LDLCALC, TRIG, CHOLHDL, LDLDIRECT,  in the last 72 hours No results found for this basename: TSH, T4TOTAL, FREET3, T3FREE, THYROIDAB,  in the last 72 hours No results found for this basename: VITAMINB12, FOLATE, FERRITIN, TIBC, IRON, RETICCTPCT,  in the last 72 hours Coags: No results found for this basename: PT, INR,  in the last 72 hours Microbiology: Recent Results (from the past 240 hour(s))  CULTURE, BLOOD (ROUTINE X 2)     Status: None   Collection Time    05/12/13  1:31 PM      Result Value Range Status   Specimen Description BLOOD RIGHT ARM   Final   Special Requests BOTTLES DRAWN AEROBIC AND ANAEROBIC 5CC   Final   Culture  Setup Time     Final   Value: 05/12/2013 22:09     Performed at [facility]   Culture     Final   Value:        BLOOD CULTURE RECEIVED NO GROWTH TO DATE CULTURE WILL BE HELD FOR 5 DAYS BEFORE ISSUING A FINAL NEGATIVE REPORT     Performed at [facility]   Report Status PENDING   Incomplete  CULTURE, BLOOD (ROUTINE X 2)     Status: None   Collection Time    05/12/13  1:42 PM      Result Value Range Status   Specimen Description BLOOD RIGHT HAND   Final   Special Requests BOTTLES DRAWN AEROBIC AND ANAEROBIC 10CC   Final   Culture  Setup Time     Final   Value: 05/12/2013 22:09     Performed at [facility]   Culture     Final   Value:        BLOOD CULTURE RECEIVED NO GROWTH TO DATE CULTURE WILL BE HELD FOR 5 DAYS BEFORE ISSUING A FINAL NEGATIVE REPORT     Performed at [facility]   Report Status PENDING   Incomplete  URINE CULTURE     Status: None   Collection Time    05/12/13  2:14 PM      Result Value Range Status   Specimen Description  URINE, CATHETERIZED   Final   Special Requests NONE   Final   Culture  Setup Time     Final   Value: 05/12/2013 22:13     Performed at [facility]     Final   Value: NO GROWTH     Performed at [facility]   Culture     Final   Value: NO GROWTH     Performed at [facility]   Report Status 05/13/2013 FINAL   Final  CULTURE, BLOOD (ROUTINE X 2)     Status: None   Collection Time    05/12/13  6:55 PM      Result Value Range Status   Specimen Description BLOOD RIGHT ARM   Final   Special Requests BOTTLES DRAWN AEROBIC AND ANAEROBIC [facility]   Final   Culture  Setup Time     Final   Value: 05/13/2013  05:37     Performed at [facility]     Final   Value:        BLOOD CULTURE RECEIVED NO GROWTH TO DATE CULTURE WILL BE HELD FOR 5 DAYS BEFORE ISSUING A FINAL NEGATIVE REPORT     Performed at [facility]   Report Status PENDING   Incomplete  CULTURE, BLOOD (ROUTINE X 2)     Status: None   Collection Time    05/12/13  7:00 PM      Result Value Range Status   Specimen Description BLOOD RIGHT HAND   Final   Special Requests BOTTLES DRAWN AEROBIC ONLY 10CC   Final   Culture  Setup Time     Final   Value: 05/13/2013 05:37     Performed at [facility]   Culture     Final   Value:        BLOOD CULTURE RECEIVED NO GROWTH TO DATE CULTURE WILL BE HELD FOR 5 DAYS BEFORE ISSUING A FINAL NEGATIVE REPORT     Performed at [facility]   Report Status PENDING   Incomplete     BRIEF HOSPITAL COURSE:   Principal Problem:   Severe sepsis - Suspect this is predominantly secondary to aspiration pneumonia, with ongoing C. difficile colitis - Patient was admitted, treated with empiric antibiotics, IV fluids. - With above measures, this is resolved.  Presumed aspiration pneumonia - Clinical feature, was suggestive of ongoing aspiration pneumonia. Patient is a very deconditioned from numerous recent illnesses. Patient was  started on IV Flagyl and Rocephin. Speech therapy evaluation was done, current recommendations a dysphagia 3 diet. This was discussed with patient's daughter at bedside by this D., acceptable the family to continue on a dysphagia 2 diet, and accepting all risks. - Since significantly improved, now awake alert, no fever, patient will be transitioned to Augmentin for another 4 more days from 1/28. - Past case management to arrange for speech therapy followup on discharge.  C. difficile colitis - No further diarrhea. Has good formed bowel movements. - Will extend treatment with a vancomycin for 2 weeks more from the day Augmentin will be stopped.  Hyponatremia - Secondary to dehydration. - This is resolved with hydration  Delirium --Likely secondary to sepsis and malnutrition  -Appears to have clinically resolved- back to baseline by the day of discharge  Atrial fibrillation with RVR - Patient was found to be in A. fib with RVR on admission. Patient was resumed on his usual medications, was given IV metoprolol prn. - Currently stable, continue with digoxin, metoprolol and Xarelto.   history of CVA - On Xarelto for CVA prophylaxis given A. Fib - Continue statin  History of recent left hip repair - Spoke with orthopedics-Dr. Alvan Dame on 1/28-okay for 50% weightbearing on the left lower extremity. Patient to followup with Dr. Alvan Dame in in one month for repeat x-rays, if continues to do well with good healing, patient will likely be advanced to more weightbearing status  Protein calorie malnutrition-severe - Secondary to acute/chronic illness - Continue with supplements on discharge   TODAY-DAY OF DISCHARGE:  Subjective:   [name] today has no headache,no chest abdominal pain,no new weakness tingling or numbness, feels much better wants to go home today. No diarrhea.  Objective:   Blood pressure 104/63, pulse 59, temperature 97.8 F (36.6 C), temperature source Oral, resp. rate  20, height 6\' 5"  (1.956 m), weight 101.606 kg (224 lb), SpO2 92.00%.  Intake/Output Summary (Last 24 hours) at 05/16/13 1407 Last data filed at 05/16/13 0900  Gross per 24 hour  Intake    270 ml  Output   1651 ml  Net  -1381 ml   Filed Weights   05/12/13 1709  Weight: 101.606 kg (224 lb)    Exam Awake Alert, Oriented *3, No new F.N deficits, Normal affect .AT,PERRAL Supple Neck,No JVD, No cervical lymphadenopathy appriciated.  Symmetrical Chest wall movement, Good air movement bilaterally, CTAB RRR,No Gallops,Rubs or new Murmurs, No Parasternal Heave +ve B.Sounds, Abd Soft, Non tender, No organomegaly appriciated, No rebound -guarding or rigidity. No Cyanosis, Clubbing or edema, No new Rash or bruise  DISCHARGE CONDITION: Stable  DISPOSITION: Home with home health services  DISCHARGE INSTRUCTIONS:    Activity:  As tolerated with Full fall precautions use walker/cane & assistance as needed  Diet recommendation: Heart Healthy diet Dysphagia 3 diet   Discharge Orders   Future Appointments Provider Department Dept Phone   06/18/2013 1:40 PM Ilean [facility] [place] [phone]   10/28/2014 8:30 AM [facility] [phone]   Eat a light meal the night before the exam but please avoid gaseous foods.   Nothing to eat or drink for at least 8 hours prior to the exam. No gum chewing or smoking the morning of the exam. Please take your morning medications with small sips of water, especially blood pressure medication. If you have several vascular lab exams and will see physician, please bring a snack with you.   10/28/2014 9:00 AM Serafina Mitchell, MD [facility] [phone]   Future Orders Complete By Expires   Diet - low sodium heart healthy  As directed    Scheduling Instructions:     Dysphagia 3 diet   Increase activity slowly  As directed       Follow-up Information   Follow up  with [facility], MD. Schedule an appointment as soon as possible for a visit in 1 week.   Specialty:  Family Medicine   Contact information:   Dr. Daiva Eves [address] [place] [address] [phone]       Follow up with Mauri Pole, MD. Schedule an appointment as soon as possible for a visit in 1 month.   Specialty:  Orthopedic Surgery   Contact information:   [address] [place] [address] [phone]         Total Time spent on discharge equals 45 minutes.  SignedOren Binet 05/16/2013 2:07 PM

## 2013-05-16 NOTE — Progress Notes (Signed)
Physical Therapy Treatment Patient Details Name: Austin [name] MRN: [id number] DOB: 1927/04/02 Today's Date: 05/16/2013 Time: 6761-9509 PT Time Calculation (min): 30 min  PT Assessment / Plan / Recommendation  History of Present Illness 80 year old male with past medical history of atrial flutter on skeletal hospitalized in October for her femur fracture not weightbearing discharge to skilled nursing facility came back to the hospital for health care associated pneumonia and C. Difficile on 05/07/2013 on vancomycin by mouth and Levaquin. He relates he's been having difficulties with eating as he has been coughing. Per daughter 24 hours prior to admission he started getting confused to the point where this morning he was hallucinating and not recognizing them. She took her temperature at home and it was 101 said he decided to bring her to the ED. He relates no new rashes, chest pain falls or nausea vomiting. He continues to have frequent amounts of loose stools.   PT Comments   Pt progressing slowly towards goals, attempted sit to stand x3 with +2 assist and pt able to unwt buttocks but not achieve standing without use of LLE. Pt family will need to use lift at home for transfers and mobility will be advanced with HHPT as WB status is advanced. PT will continue to follow.   Follow Up Recommendations  Home health PT;Supervision/Assistance - 24 hour     Does the patient have the potential to tolerate intense rehabilitation     Barriers to Discharge        Equipment Recommendations  None recommended by PT    Recommendations for Other Services    Frequency Min 3X/week   Progress towards PT Goals Progress towards PT goals: Progressing toward goals  Plan Current plan remains appropriate    Precautions / Restrictions Precautions Precautions: Fall Restrictions Weight Bearing Restrictions: Yes LLE Weight Bearing: Non weight bearing Other Position/Activity Restrictions: per doctor  [place], Dr. Alvan Dame advancing pt's WB status to [facility] 50% after reading x-ray from this morning. This was not known at hime of treatment session.   Pertinent Vitals/Pain No c/o pain, O2 sats 92% on RA    Mobility  Bed Mobility Overal bed mobility: Needs Assistance Bed Mobility: Supine to Sit;Sit to Supine Supine to sit: Min assist Sit to supine: Min assist General bed mobility comments: pt able to initiate sitting but needed min A to scoot hips to EOB, difficulty pushing through arms and wt-shifting. Min A to left leg for sit to supine Transfers Overall transfer level: Needs assistance Equipment used: Rolling walker (2 wheeled) Transfers: Sit to/from Stand Sit to Stand: +2 physical assistance General transfer comment: kept pt NWB LLE and performed sit to stand trial 3x. Pt able to unwt buttocks with +2 assist but not achieve squat or stand. Will need lift at home to transfer bed to chair. Pt needed assistance with forward translation as well as push on R LE Ambulation/Gait General Gait Details: unable    Exercises General Exercises - Lower Extremity Ankle Circles/Pumps: AROM;Both;10 reps;Supine Long Arc Quad: AROM;10 reps;Left;Seated Heel Slides: AROM;Left;10 reps;Supine Hip ABduction/ADduction: AROM;Left;10 reps;Supine Straight Leg Raises: AROM;Left;10 reps;Supine   PT Diagnosis:    PT Problem List:   PT Treatment Interventions:     PT Goals (current goals can now be found in the care plan section) Acute Rehab PT Goals Patient Stated Goal: go home PT Goal Formulation: With patient Time For Goal Achievement: 05/22/13 Potential to Achieve Goals: Good  Visit Information  Last PT Received On: 05/16/13 Assistance Needed: +2  History of Present Illness: 80 year old male with past medical history of atrial flutter on skeletal hospitalized in October for her femur fracture not weightbearing discharge to skilled nursing facility came back to the hospital for health care associated  pneumonia and C. Difficile on 05/07/2013 on vancomycin by mouth and Levaquin. He relates he's been having difficulties with eating as he has been coughing. Per daughter 24 hours prior to admission he started getting confused to the point where this morning he was hallucinating and not recognizing them. She took her temperature at home and it was 101 said he decided to bring her to the ED. He relates no new rashes, chest pain falls or nausea vomiting. He continues to have frequent amounts of loose stools.    Subjective Data  Subjective: pt wants to get home Patient Stated Goal: go home   Cognition  Cognition Arousal/Alertness: Awake/alert Behavior During Therapy: WFL for tasks assessed/performed Overall Cognitive Status: Within Functional Limits for tasks assessed    Balance  Balance Overall balance assessment: Needs assistance Sitting-balance support: No upper extremity supported;Feet supported Sitting balance-Leahy Scale: Good Standing balance support: Bilateral upper extremity supported;During functional activity Standing balance-Leahy Scale: Zero  End of Session PT - End of Session Equipment Utilized During Treatment: Gait belt Activity Tolerance: Patient tolerated treatment well Patient left: in bed;with call bell/phone within reach Nurse Communication: Mobility status   GP   Leighton Roach, [place]  [place], [place] 05/16/2013, 2:22 PM

## 2013-05-16 NOTE — Progress Notes (Signed)
Patient ID: Austin [name], male   DOB: 16-Jul-1926, 80 y.o.   MRN: [id number]  Received call about [name] admission  X-rays ordered  Once completed I will review and provide recs

## 2013-05-16 NOTE — Progress Notes (Signed)
Speech Language Pathology Treatment: Dysphagia  Patient Details Name: Austin [name] MRN: [id number] DOB: [birth year] Today's Date: 05/16/2013 Time: 8416-6063 SLP Time Calculation (min): 32 min  Assessment / Plan / Recommendation Clinical Impression  Patient was able to verbalize and demonstrate compensatory strategies that were previously taught after MBS on 1/27.  Pt. Did have intermittent delayed cough.  Reviewed MBS report which states pt. Coughs and expels penetrated thin liquid, therefore, will continue current recommendations with chin tuck and double swallows.   HPI HPI: 80 year old male with past medical history of atrial flutter on skeletal hospitalized in October for femur fracture not weightbearing discharge to skilled nursing facility came back to the hospital for health care associated pneumonia and C. Difficile on 05/07/2013 on vancomycin by mouth and Levaquin. He relates he's been having difficulties with eating as he has been coughing. Per daughter 24 hours prior to admission he started getting confused to the point where this morning he was hallucinating and not recognizing them. She took her temperature at home and it was 101. Concern for aspiration pna per chart.    Pertinent Vitals afebrile  SLP Plan  Continue with current plan of care    Recommendations Diet recommendations: Dysphagia 3 (mechanical soft);Thin liquid Liquids provided via: Straw Medication Administration: Whole meds with liquid Supervision: Patient able to self feed Compensations: Slow rate;Small sips/bites;Multiple dry swallows after each bite/sip Postural Changes and/or Swallow Maneuvers: Chin tuck;Seated upright 90 degrees              Oral Care Recommendations: Oral care BID Follow up Recommendations: Inpatient Rehab Plan: Continue with current plan of care    GO     Quinn Axe T 05/16/2013, 2:58 PM

## 2013-05-16 NOTE — Clinical Social Work Note (Signed)
CSW has requested ambulance transport for patient. Dc packet left on chart. Daughter notified.    Liz Beach, [place], [place], [phone]

## 2013-05-18 LAB — CULTURE, BLOOD (ROUTINE X 2)
Culture: NO GROWTH
Culture: NO GROWTH

## 2013-05-19 LAB — CULTURE, BLOOD (ROUTINE X 2)
CULTURE: NO GROWTH
Culture: NO GROWTH

## 2013-06-07 ENCOUNTER — Other Ambulatory Visit: Payer: Self-pay | Admitting: Internal Medicine

## 2013-06-12 ENCOUNTER — Other Ambulatory Visit: Payer: Self-pay | Admitting: Internal Medicine

## 2013-06-18 ENCOUNTER — Ambulatory Visit: Payer: Medicare Other | Admitting: Cardiology

## 2013-06-21 ENCOUNTER — Encounter (HOSPITAL_COMMUNITY): Payer: Self-pay | Admitting: Emergency Medicine

## 2013-06-21 ENCOUNTER — Emergency Department (HOSPITAL_COMMUNITY): Payer: Medicare Other

## 2013-06-21 ENCOUNTER — Inpatient Hospital Stay (HOSPITAL_COMMUNITY)
Admission: EM | Admit: 2013-06-21 | Discharge: 2013-07-05 | DRG: 181 | Disposition: A | Discharge status: Hospice | Payer: Medicare Other | Attending: Internal Medicine | Admitting: Internal Medicine

## 2013-06-21 DIAGNOSIS — J690 Pneumonitis due to inhalation of food and vomit: Secondary | ICD-10-CM

## 2013-06-21 DIAGNOSIS — I4892 Unspecified atrial flutter: Secondary | ICD-10-CM

## 2013-06-21 DIAGNOSIS — J449 Chronic obstructive pulmonary disease, unspecified: Secondary | ICD-10-CM | POA: Diagnosis present

## 2013-06-21 DIAGNOSIS — E870 Hyperosmolality and hypernatremia: Secondary | ICD-10-CM

## 2013-06-21 DIAGNOSIS — S8012XA Contusion of left lower leg, initial encounter: Secondary | ICD-10-CM

## 2013-06-21 DIAGNOSIS — D62 Acute posthemorrhagic anemia: Secondary | ICD-10-CM

## 2013-06-21 DIAGNOSIS — I4891 Unspecified atrial fibrillation: Secondary | ICD-10-CM

## 2013-06-21 DIAGNOSIS — Z87891 Personal history of nicotine dependence: Secondary | ICD-10-CM

## 2013-06-21 DIAGNOSIS — Z6829 Body mass index (BMI) 29.0-29.9, adult: Secondary | ICD-10-CM

## 2013-06-21 DIAGNOSIS — I2510 Atherosclerotic heart disease of native coronary artery without angina pectoris: Secondary | ICD-10-CM

## 2013-06-21 DIAGNOSIS — R1319 Other dysphagia: Secondary | ICD-10-CM | POA: Diagnosis present

## 2013-06-21 DIAGNOSIS — F411 Generalized anxiety disorder: Secondary | ICD-10-CM | POA: Diagnosis present

## 2013-06-21 DIAGNOSIS — S72402A Unspecified fracture of lower end of left femur, initial encounter for closed fracture: Secondary | ICD-10-CM

## 2013-06-21 DIAGNOSIS — R042 Hemoptysis: Secondary | ICD-10-CM

## 2013-06-21 DIAGNOSIS — M7989 Other specified soft tissue disorders: Secondary | ICD-10-CM

## 2013-06-21 DIAGNOSIS — R410 Disorientation, unspecified: Secondary | ICD-10-CM

## 2013-06-21 DIAGNOSIS — Z96659 Presence of unspecified artificial knee joint: Secondary | ICD-10-CM

## 2013-06-21 DIAGNOSIS — Z8612 Personal history of poliomyelitis: Secondary | ICD-10-CM

## 2013-06-21 DIAGNOSIS — L259 Unspecified contact dermatitis, unspecified cause: Secondary | ICD-10-CM | POA: Diagnosis present

## 2013-06-21 DIAGNOSIS — A0472 Enterocolitis due to Clostridium difficile, not specified as recurrent: Secondary | ICD-10-CM

## 2013-06-21 DIAGNOSIS — I639 Cerebral infarction, unspecified: Secondary | ICD-10-CM

## 2013-06-21 DIAGNOSIS — I714 Abdominal aortic aneurysm, without rupture, unspecified: Secondary | ICD-10-CM

## 2013-06-21 DIAGNOSIS — A419 Sepsis, unspecified organism: Secondary | ICD-10-CM

## 2013-06-21 DIAGNOSIS — R6520 Severe sepsis without septic shock: Secondary | ICD-10-CM

## 2013-06-21 DIAGNOSIS — Z66 Do not resuscitate: Secondary | ICD-10-CM | POA: Diagnosis present

## 2013-06-21 DIAGNOSIS — R918 Other nonspecific abnormal finding of lung field: Secondary | ICD-10-CM

## 2013-06-21 DIAGNOSIS — E669 Obesity, unspecified: Secondary | ICD-10-CM

## 2013-06-21 DIAGNOSIS — Z515 Encounter for palliative care: Secondary | ICD-10-CM

## 2013-06-21 DIAGNOSIS — E785 Hyperlipidemia, unspecified: Secondary | ICD-10-CM

## 2013-06-21 DIAGNOSIS — A4901 Methicillin susceptible Staphylococcus aureus infection, unspecified site: Secondary | ICD-10-CM | POA: Diagnosis present

## 2013-06-21 DIAGNOSIS — Z801 Family history of malignant neoplasm of trachea, bronchus and lung: Secondary | ICD-10-CM

## 2013-06-21 DIAGNOSIS — R5383 Other fatigue: Secondary | ICD-10-CM

## 2013-06-21 DIAGNOSIS — R1310 Dysphagia, unspecified: Secondary | ICD-10-CM

## 2013-06-21 DIAGNOSIS — Z7901 Long term (current) use of anticoagulants: Secondary | ICD-10-CM

## 2013-06-21 DIAGNOSIS — C3490 Malignant neoplasm of unspecified part of unspecified bronchus or lung: Principal | ICD-10-CM | POA: Diagnosis present

## 2013-06-21 DIAGNOSIS — C3400 Malignant neoplasm of unspecified main bronchus: Secondary | ICD-10-CM

## 2013-06-21 DIAGNOSIS — J189 Pneumonia, unspecified organism: Secondary | ICD-10-CM

## 2013-06-21 DIAGNOSIS — Z96649 Presence of unspecified artificial hip joint: Secondary | ICD-10-CM

## 2013-06-21 DIAGNOSIS — R52 Pain, unspecified: Secondary | ICD-10-CM

## 2013-06-21 DIAGNOSIS — I10 Essential (primary) hypertension: Secondary | ICD-10-CM

## 2013-06-21 DIAGNOSIS — Z8701 Personal history of pneumonia (recurrent): Secondary | ICD-10-CM

## 2013-06-21 DIAGNOSIS — R509 Fever, unspecified: Secondary | ICD-10-CM

## 2013-06-21 DIAGNOSIS — I959 Hypotension, unspecified: Secondary | ICD-10-CM

## 2013-06-21 DIAGNOSIS — E872 Acidosis, unspecified: Secondary | ICD-10-CM

## 2013-06-21 DIAGNOSIS — K222 Esophageal obstruction: Secondary | ICD-10-CM | POA: Diagnosis present

## 2013-06-21 DIAGNOSIS — J4489 Other specified chronic obstructive pulmonary disease: Secondary | ICD-10-CM | POA: Diagnosis present

## 2013-06-21 DIAGNOSIS — E039 Hypothyroidism, unspecified: Secondary | ICD-10-CM | POA: Diagnosis present

## 2013-06-21 DIAGNOSIS — Z8673 Personal history of transient ischemic attack (TIA), and cerebral infarction without residual deficits: Secondary | ICD-10-CM

## 2013-06-21 DIAGNOSIS — R531 Weakness: Secondary | ICD-10-CM

## 2013-06-21 DIAGNOSIS — T8119XA Other postprocedural shock, initial encounter: Secondary | ICD-10-CM

## 2013-06-21 DIAGNOSIS — Z79899 Other long term (current) drug therapy: Secondary | ICD-10-CM

## 2013-06-21 DIAGNOSIS — D689 Coagulation defect, unspecified: Secondary | ICD-10-CM

## 2013-06-21 DIAGNOSIS — E440 Moderate protein-calorie malnutrition: Secondary | ICD-10-CM

## 2013-06-21 DIAGNOSIS — R222 Localized swelling, mass and lump, trunk: Secondary | ICD-10-CM

## 2013-06-21 DIAGNOSIS — R5381 Other malaise: Secondary | ICD-10-CM | POA: Diagnosis present

## 2013-06-21 DIAGNOSIS — E43 Unspecified severe protein-calorie malnutrition: Secondary | ICD-10-CM

## 2013-06-21 LAB — CBC WITH DIFFERENTIAL/PLATELET
BASOS PCT: 0 % (ref 0–1)
Basophils Absolute: 0 10*3/uL (ref 0.0–0.1)
EOS ABS: 0.1 10*3/uL (ref 0.0–0.7)
Eosinophils Relative: 1 % (ref 0–5)
HEMATOCRIT: 36.4 % — AB (ref 39.0–52.0)
Hemoglobin: 12.3 g/dL — ABNORMAL LOW (ref 13.0–17.0)
Lymphocytes Relative: 25 % (ref 12–46)
Lymphs Abs: 2.1 10*3/uL (ref 0.7–4.0)
MCH: 30.1 pg (ref 26.0–34.0)
MCHC: 33.8 g/dL (ref 30.0–36.0)
MCV: 89 fL (ref 78.0–100.0)
MONO ABS: 0.7 10*3/uL (ref 0.1–1.0)
MONOS PCT: 9 % (ref 3–12)
NEUTROS PCT: 65 % (ref 43–77)
Neutro Abs: 5.5 10*3/uL (ref 1.7–7.7)
Platelets: 185 10*3/uL (ref 150–400)
RBC: 4.09 MIL/uL — ABNORMAL LOW (ref 4.22–5.81)
RDW: 16.8 % — AB (ref 11.5–15.5)
WBC: 8.4 10*3/uL (ref 4.0–10.5)

## 2013-06-21 LAB — BASIC METABOLIC PANEL
BUN: 13 mg/dL (ref 6–23)
CO2: 26 mEq/L (ref 19–32)
CREATININE: 0.63 mg/dL (ref 0.50–1.35)
Calcium: 10 mg/dL (ref 8.4–10.5)
Chloride: 95 mEq/L — ABNORMAL LOW (ref 96–112)
GFR calc non Af Amer: 88 mL/min — ABNORMAL LOW (ref >90)
Glucose, Bld: 107 mg/dL — ABNORMAL HIGH (ref 70–99)
Potassium: 4.1 mEq/L (ref 3.7–5.3)
Sodium: 134 mEq/L — ABNORMAL LOW (ref 137–147)

## 2013-06-21 MED ORDER — ALBUTEROL SULFATE (2.5 MG/3ML) 0.083% IN NEBU
2.5000 mg | INHALATION_SOLUTION | Freq: Four times a day (QID) | RESPIRATORY_TRACT | Status: DC | PRN
  Administered 2013-06-21: 2.5 mg via RESPIRATORY_TRACT
  Filled 2013-06-21 (×2): qty 3

## 2013-06-21 MED ORDER — ALPRAZOLAM 0.25 MG PO TABS
0.2500 mg | ORAL_TABLET | Freq: Once | ORAL | Status: AC
  Administered 2013-06-21: 0.25 mg via ORAL
  Filled 2013-06-21: qty 1

## 2013-06-21 MED ORDER — SODIUM CHLORIDE 0.9 % IV SOLN
Freq: Once | INTRAVENOUS | Status: AC
  Administered 2013-06-21: 17:00:00 via INTRAVENOUS

## 2013-06-21 MED ORDER — IOHEXOL 350 MG/ML SOLN
100.0000 mL | Freq: Once | INTRAVENOUS | Status: AC | PRN
  Administered 2013-06-21: 100 mL via INTRAVENOUS

## 2013-06-21 NOTE — Progress Notes (Signed)
   CARE MANAGEMENT ED NOTE 06/21/2013  Patient:  Austin [name], Austin [name]   Account Number:  [id number]  Date Initiated:  06/21/2013  Documentation initiated by:  Livia Snellen  Subjective/Objective Assessment:   Patient presents to Ed with coughing up blood and weakness.     Subjective/Objective Assessment Detail:   Patient with pmhx of HTN, AAA, CAD, A flutter, COPD.     Action/Plan:   CTA chest completed in the ED   Action/Plan Detail:   Patient to be admitted   Anticipated DC Date:       Status Recommendation to Physician:   Result of Recommendation:    Other ED [place]  CM consult  Other    Choice offered to / List presented to:            Status of service:  Completed, signed off  ED Comments:   ED Comments Detail:  EDCM spoke to patient and family at bedside, patient's daughetr an son Austin [name] and Austin [name].  Patient lives with his daughter Austin [name].  As per patient, is receiving home health services from [facility] a visiting RN, speech therapist, PT and home health aide.  Patient has a walker, cane, bedisde commode, wheeelchair, safety rails in bathroom, slide board, hospital bed with trapeze, nebulizer and oxygen at home.  Patient's daughter reports he wears his oxygen "when his oxygen level gets below 90."  After patient broke his femur in October or November per family, patient was sent to [facility] in [place]. Patient was discharged from [facility] on Jan 9th 2015. Patient has been home for six weeks.  EDCM provide patient with list of home health agencies in [place] highlighting [place].  Patient's family anxious to go to room upstairs because they are worried about ice storm.  No further EDCM needs at this time.

## 2013-06-21 NOTE — ED Notes (Signed)
Per pt cough up blood in sputum only in the morning. Pt states does not cough up blood all the time.

## 2013-06-21 NOTE — ED Notes (Signed)
Per EMS pt pneumonia 04/27/2013. Pt has been "sporatically coughing up blood" this week.  Pt had hip surgery and left femur fracture in 12/2012. Pt non weight baring to left leg.

## 2013-06-21 NOTE — H&P (Signed)
Patient Demographics  Austin [name], is a 80 y.o. male  MRN: [id number]   DOB - [birth year]  Admit Date - 06/21/2013  Outpatient Primary MD for the patient is [name], MD   With History of -  Past Medical History  Diagnosis Date  . Hypertension   . Hyperlipidemia   . AAA (abdominal aortic aneurysm)   . Arthritis   . CAD (coronary artery disease)   . Thyroid disease     hypothyroidism  . Dysrhythmia     atrial flutter  . Shortness of breath     a little with exertion  . Polio 1934  . Atrial flutter     chronic anticoagulation  . COPD (chronic obstructive pulmonary disease)   . History of nuclear stress test 04/2010    dipyridamole; small, mostly fixed basal to mid inferior and inferoseptal defect (gut artifact v. scar); post stress EF 62%; non-diagnostic for ischemia; low risk       Past Surgical History  Procedure Laterality Date  . Abdominal aortic aneurysm repair  05/21/2010    aorto bi-iliac BPG  . Appendectomy  1949  . Popliteal synovial cyst excision Left   . Joint replacement      Knee and Hip replacements  . Total hip arthroplasty Left 1994  . Knee arthroplasty Left 2004  . Total hip revision Left 12/04/2012    Procedure: LEFT TOTAL HIP REVISION;  Surgeon: Mauri Pole, MD;  Location: [facility];  Service: Orthopedics;  Laterality: Left;  . Cardiac catheterization  2009    in [place]; 50-60% RCA  . Transthoracic echocardiogram  04/2010    EF =>55%; RV borderline dilated; LA mildly dilated; mild mitral annular calcif & mild MR; mild TR; mild calcif of AV leaflets with mild AV stenosis; aortic root sclerosis/calcif  . Orif femur fracture Left 01/22/2013    Procedure: OPEN REDUCTION INTERNAL FIXATION (ORIF) DISTAL FEMUR FRACTURE;  Surgeon: Mauri Pole, MD;  Location: [facility];  Service:  Orthopedics;  Laterality: Left;    in for   Chief Complaint  Patient presents with  . Hematemesis     HPI  Austin [name]  is a 80 y.o. male,presents with complaints of hemoptysis, son and daughter at bedside, they give most of the history, patient has been coughing over the last 2 months, diagnosed with pneumonia, treated with Augmentin, had C. difficile is status post his treatment with by mouth vancomycin before 2 weeks, any diarrhea currently, and had persistent cough over a few weeks, with productive sputum, recently started to develop hemoptysis, minimal amount, mainly in a.m.,no hemoptysis here, patient had CT chest angiogram which did show mediastinal and left hilar mass, reports patient lost 40 pounds over last 5 months, is currently bedridden last few month after femur fracture, it is on Xarelto, time took it yesterday at 6 PM, patient reports dysphagia and cough to solids.    Review of Systems    In addition  to the HPI above,  No Fever-chills, No Headache, No changes with Vision or hearing,  dysphagia to solids , cough with liquid and solid. No Chest pain,  complains of Cough or Shortness of Breath,and hemoptysis. No Abdominal pain, No Nausea or Vommitting, Bowel movements are regular, No Blood in stool or Urine, No dysuria, No new skin rashes or bruises, No new joints pains-aches,  No new weakness, tingling, numbness in any extremity, 40 LBSt weight or loss, No polyuria, polydypsia or polyphagia, No significant Mental Stressors.  A full 10 point Review of Systems was done, except as stated above, all other Review of Systems were negative.   Social History History  Substance Use Topics  . Smoking status: Former Smoker -- 1.00 packs/day for 25 years    Types: Cigarettes    Quit date: 04/19/1966  . Smokeless tobacco: Never Used     Comment: 100 pack years per 08/2012 note  . Alcohol Use: 0.0 oz/week     Comment: glass of wine daily, scotch 3 or 4 times a week.  Not in last 3 months     Family History Family History  Problem Relation Age of Onset  . Other Father     varicose veins  . Hyperlipidemia Son   . Hypertension Son   . AAA (abdominal aortic aneurysm) Son   . Lung cancer Brother      Prior to Admission medications   Medication Sig Start Date End Date Taking? Authorizing Provider  albuterol (PROVENTIL) (2.5 MG/3ML) 0.083% nebulizer solution Take 2.5 mg by nebulization every 6 (six) hours as needed for wheezing or shortness of breath.   Yes Historical Provider, MD  cholecalciferol (VITAMIN D) 1000 UNITS tablet Take 1,000 Units by mouth daily.   Yes Historical Provider, MD  digoxin (LANOXIN) 0.125 MG tablet Take 1 tablet (0.125 mg total) by mouth daily. 01/28/13  Yes Ripudeep Krystal Eaton, MD  ferrous sulfate 325 (65 FE) MG tablet Take 325 mg by mouth daily with breakfast.   Yes Historical Provider, MD  levothyroxine (SYNTHROID, LEVOTHROID) 75 MCG tablet Take 75 mcg by mouth daily before breakfast.   Yes Historical Provider, MD  lovastatin (MEVACOR) 40 MG tablet Take 40 mg by mouth daily.    Yes Historical Provider, MD  metoprolol tartrate (LOPRESSOR) 25 MG tablet Take 1 tablet (25 mg total) by mouth 2 (two) times daily. 05/16/13  Yes Shanker Kristeen Mans, MD  Multiple Vitamin (MULTIVITAMIN WITH MINERALS) TABS tablet Take 1 tablet by mouth daily.   Yes Historical Provider, MD  naproxen sodium (ANAPROX) 220 MG tablet Take 220-440 mg by mouth as needed (pain).   Yes Historical Provider, MD  Rivaroxaban (XARELTO) 20 MG TABS Take 20 mg by mouth daily.   Yes Historical Provider, MD    No Known Allergies  Physical Exam  Vitals  Blood pressure 107/57, pulse 65, temperature 98.7 F (37.1 C), temperature source Oral, resp. rate 16, SpO2 95.00%.   1. General  Elderly lying in bed in NAD,    2. Normal affect and insight, Not Suicidal or Homicidal, Awake Alert, Oriented X 3.  3. No F.N deficits, ALL C.Nerves Intact, Strength 5/5 all 4 extremities,  Sensation intact all 4 extremities, Plantars down going.  4. Ears and Eyes appear Normal, Conjunctivae clear, PERRLA. Moist Oral Mucosa.  5. Supple Neck, No JVD, No cervical lymphadenopathy appriciated, No Carotid Bruits.  6. Symmetrical Chest wall movement, Good air movement bilaterally, left lung rales and rhonchi  7. RRR, No Gallops, Rubs  or Murmurs, No Parasternal Heave.  8. Positive Bowel Sounds, Abdomen Soft, Non tender, No organomegaly appriciated,No rebound -guarding or rigidity.  9.  No Cyanosis, Normal Skin Turgor, No Skin Rash or Bruise.  10. Good muscle tone,  joints appear normal , no effusions, Normal ROM.     Data Review  CBC  Recent Labs Lab 06/21/13 1640  WBC 8.4  HGB 12.3*  HCT 36.4*  PLT 185  MCV 89.0  MCH 30.1  MCHC 33.8  RDW 16.8*  LYMPHSABS 2.1  MONOABS 0.7  EOSABS 0.1  BASOSABS 0.0   ------------------------------------------------------------------------------------------------------------------  Chemistries   Recent Labs Lab 06/21/13 1640  NA 134*  K 4.1  CL 95*  CO2 26  GLUCOSE 107*  BUN 13  CREATININE 0.63  CALCIUM 10.0   ------------------------------------------------------------------------------------------------------------------ CrCl is unknown because both a height and weight (above a minimum accepted value) are required for this calculation. ------------------------------------------------------------------------------------------------------------------ No results found for this basename: TSH, T4TOTAL, FREET3, T3FREE, THYROIDAB,  in the last 72 hours   Coagulation profile No results found for this basename: INR, PROTIME,  in the last 168 hours ------------------------------------------------------------------------------------------------------------------- No results found for this basename: DDIMER,  in the last 72  hours -------------------------------------------------------------------------------------------------------------------  Cardiac Enzymes No results found for this basename: CK, CKMB, TROPONINI, MYOGLOBIN,  in the last 168 hours ------------------------------------------------------------------------------------------------------------------ No components found with this basename: POCBNP,    ---------------------------------------------------------------------------------------------------------------  Urinalysis    Component Value Date/Time   COLORURINE YELLOW 05/12/2013 1414   APPEARANCEUR CLEAR 05/12/2013 1414   LABSPEC 1.022 05/12/2013 1414   PHURINE 6.5 05/12/2013 1414   GLUCOSEU NEGATIVE 05/12/2013 1414   HGBUR NEGATIVE 05/12/2013 1414   BILIRUBINUR NEGATIVE 05/12/2013 1414   KETONESUR NEGATIVE 05/12/2013 1414   PROTEINUR NEGATIVE 05/12/2013 1414   UROBILINOGEN 0.2 05/12/2013 1414   NITRITE NEGATIVE 05/12/2013 1414   LEUKOCYTESUR NEGATIVE 05/12/2013 1414    ----------------------------------------------------------------------------------------------------------------  Imaging results:   Ct Angio Chest Pe W/cm &/or Wo Cm  06/21/2013   CLINICAL DATA:  Hemoptysis, left lower extremity edema, recent hip surgery  EXAM: CT ANGIOGRAPHY CHEST WITH CONTRAST  TECHNIQUE: Multidetector CT imaging of the chest was performed using the standard protocol during bolus administration of intravenous contrast. Multiplanar CT image reconstructions and MIPs were obtained to evaluate the vascular anatomy.  CONTRAST:  157mL OMNIPAQUE IOHEXOL 350 MG/ML SOLN  COMPARISON:  DG CHEST 1V PORT dated 05/12/2013  FINDINGS: The contrast bolus is limited in terms of density. No definite intraluminal pulmonary artery defects are demonstrated. There is an abnormal mediastinal and left hilar mass. This is producing significant mass effect upon the proximal left main pulmonary artery, but flow distally is still demonstrated.  There is also mass effect upon the esophagus with distention of the proximal esophagus to the level of the carina. There is an anterior left hilar soft tissue mass 3.2 cm transversely by 4.9 cm AP. There is a 2 cm right hilar lymph node on image 48. Definite paratracheal lymphadenopathy is not demonstrated. The cardiac chambers are mildly enlarged. There is no pericardial effusion. The caliber of the thoracic aorta is normal. These soft tissue mass abuts the anterior and left lateral surface of the descending thoracic aorta.  There is a small left pleural effusion. Patchy areas of increased parenchymal density are present in the left upper lobe. Confluent increased density peripherally in the left lower lobe is present. On the right there is subsegmental atelectasis or early infiltrate in the lower lobe. There are emphysematous changes in both lungs.  Within the upper abdomen the observed portions of the liver and spleen appear normal. The adrenal glands are not included in the field of view. The thoracic vertebral bodies are preserved in height. The sternum appears intact. The observed portions of the ribs exhibit no acute abnormalities.  Review of the MIP images confirms the above findings.  IMPRESSION: 1. There is an abnormal mediastinal and left hilar mass producing mass effect upon the left main pulmonary artery. Classic pulmonary emboli are not demonstrated but vascular flow to the left lung is markedly impaired by the large mass. This mass is also resulting in esophageal obstruction with the proximal and mid portions of the esophagus distended with fluid and gas. The appearance is consistent with a central lung malignancy or malignancy of the distal esophagus. 2. There is a small left pleural effusion. 3. There is postobstructive atelectasis in the left lower lobe. Patchy areas of interstitial density in the left upper lobe are nonspecific but could reflect parenchymal metastatic disease. There is likely  atelectasis or early infiltrate in the right lower lobe. 4. These results were called by telephone at the time of interpretation on 06/21/2013 at 6:00 PM to Dr. Tanna Furry , who verbally acknowledged these results.   Electronically Signed   By: David  Martinique   On: 06/21/2013 18:02        Assessment & Plan  Active Problems:   Hemoptysis   Chest mass   Dysphagia   Atrial fibrillation   Hyperlipemia   Hypertension    1. Hemoptysis: Is most likely related to patient's chest mass, will hold Xarelto, and all forms of anticoagulation, discussed with pulmonary, totalpossible plan for bronchoscopy and biopsy. 2:dysphagea: keep NPO till swallow evaluation in am. 3. Atrial fibrillation: rate Controlled, continue with metoprolol and digoxin, hold anticoagulation it due to to #1 4. Hypertension: Acceptable continue with metoprolol 5. Hyperlipedemia: cont with statin  DVT  Prophylaxis SCDs   AM Labs Ordered, also please review Full Orders  Family Communication: Admission, patients condition and plan of care including tests being ordered have been discussed with the patient and son and daughter who indicate understanding and agree with the plan and Code Status.  Code Status DNR, son and daughter are HCPOA  Likely DC to  home  Condition GUARDED    Time spent in minutes : 61 min    Ladislao Cohenour M.D on 06/21/2013 at 8:24 PM   And look for the night coverage person covering me after hours  [facility] Office  [phone]

## 2013-06-21 NOTE — ED Notes (Signed)
Patient transported to CT 

## 2013-06-21 NOTE — ED Provider Notes (Signed)
CSN: [id number]     Arrival date & time 06/21/13  1538 History   First MD Initiated Contact with Patient 06/21/13 1551     Chief Complaint  Patient presents with  . Hematemesis      HPI  Patient presents from home. He is here with his son, and daughter. The hospital discharge January 15 of this year. He went home upon discharge. Complicated recent history with a femur fracture, and hip fracture in September and October of last year. He was at a rehabilitation/nursing facility for several weeks following this. Since his discharge from the hospital 5 weeks ago he has been very weak. Had difficulty getting up. Per the difficulty is that he is still only partially weightbearing on his left leg with a walker. He is getting home nursing and home physical therapy. Despite this he is unable to sit or get from bed independently.  He should last several mornings he has had an episode of hemoptysis. His daughter indicates that he coughs up a clot that is "about half  the size of a tangerine".  Past Medical History  Diagnosis Date  . Hypertension   . Hyperlipidemia   . AAA (abdominal aortic aneurysm)   . Arthritis   . CAD (coronary artery disease)   . Thyroid disease     hypothyroidism  . Dysrhythmia     atrial flutter  . Shortness of breath     a little with exertion  . Polio 1934  . Atrial flutter     chronic anticoagulation  . COPD (chronic obstructive pulmonary disease)   . History of nuclear stress test 04/2010    dipyridamole; small, mostly fixed basal to mid inferior and inferoseptal defect (gut artifact v. scar); post stress EF 62%; non-diagnostic for ischemia; low risk    Past Surgical History  Procedure Laterality Date  . Abdominal aortic aneurysm repair  05/21/2010    aorto bi-iliac BPG  . Appendectomy  1949  . Popliteal synovial cyst excision Left   . Joint replacement      Knee and Hip replacements  . Total hip arthroplasty Left 1994  . Knee arthroplasty Left 2004  . Total  hip revision Left 12/04/2012    Procedure: LEFT TOTAL HIP REVISION;  Surgeon: Mauri Pole, MD;  Location: [facility];  Service: Orthopedics;  Laterality: Left;  . Cardiac catheterization  2009    in [place]; 50-60% RCA  . Transthoracic echocardiogram  04/2010    EF =>55%; RV borderline dilated; LA mildly dilated; mild mitral annular calcif & mild MR; mild TR; mild calcif of AV leaflets with mild AV stenosis; aortic root sclerosis/calcif  . Orif femur fracture Left 01/22/2013    Procedure: OPEN REDUCTION INTERNAL FIXATION (ORIF) DISTAL FEMUR FRACTURE;  Surgeon: Mauri Pole, MD;  Location: [facility];  Service: Orthopedics;  Laterality: Left;  . Video bronchoscopy Bilateral 06/25/2013    Procedure: VIDEO BRONCHOSCOPY WITHOUT FLUORO;  Surgeon: Rush Farmer, MD;  Location: [facility];  Service: Cardiopulmonary;  Laterality: Bilateral;   Family History  Problem Relation Age of Onset  . Other Father     varicose veins  . Hyperlipidemia Son   . Hypertension Son   . AAA (abdominal aortic aneurysm) Son   . Lung cancer Brother    History  Substance Use Topics  . Smoking status: Former Smoker -- 1.00 packs/day for 25 years    Types: Cigarettes    Quit date: 04/19/1966  . Smokeless tobacco: Never Used  Comment: 100 pack years per 08/2012 note  . Alcohol Use: 0.0 oz/week     Comment: glass of wine daily, scotch 3 or 4 times a week. Not in last 3 months    Review of Systems  Constitutional: Positive for fatigue. Negative for fever, chills, diaphoresis and appetite change.  HENT: Negative for mouth sores, sore throat and trouble swallowing.   Eyes: Negative for visual disturbance.  Respiratory: Positive for cough and shortness of breath. Negative for chest tightness and wheezing.   Cardiovascular: Negative for chest pain.  Gastrointestinal: Negative for nausea, vomiting, abdominal pain, diarrhea and abdominal distention.  Endocrine: Negative for polydipsia, polyphagia and polyuria.  Genitourinary:  Negative for dysuria, frequency and hematuria.  Musculoskeletal: Positive for arthralgias and gait problem.  Skin: Negative for color change, pallor and rash.  Neurological: Positive for weakness. Negative for dizziness, syncope, light-headedness and headaches.  Hematological: Does not bruise/bleed easily.  Psychiatric/Behavioral: Negative for behavioral problems and confusion.      Allergies  Review of patient's allergies indicates no known allergies.  Home Medications   No current outpatient prescriptions on file. BP 108/69  Pulse 81  Temp(Src) 98.4 F (36.9 C) (Oral)  Resp 20  Ht 6\' 5"  (1.956 m)  Wt 245 lb 6 oz (111.3 kg)  BMI 29.09 kg/m2  SpO2 98% Physical Exam  Constitutional: He is oriented to person, place, and time. He appears well-developed and well-nourished. No distress.  Appears chronically ill. Does not appear acutely ill or distress.  HENT:  Head: Normocephalic.  Eyes: Conjunctivae are normal. Pupils are equal, round, and reactive to light. No scleral icterus.  Neck: Normal range of motion. Neck supple. No thyromegaly present.  Cardiovascular: Normal rate and regular rhythm.  Exam reveals no gallop and no friction rub.   No murmur heard. Pulmonary/Chest: Effort normal and breath sounds normal. No respiratory distress. He has no wheezes. He has no rales.    No abnormal or asymmetric breath sounds.  Abdominal: Soft. Bowel sounds are normal. He exhibits no distension. There is no tenderness. There is no rebound.  Musculoskeletal: Normal range of motion.  Neurological: He is alert and oriented to person, place, and time.  Skin: Skin is warm and dry. No rash noted.  Psychiatric: He has a normal mood and affect. His behavior is normal.    ED Course  Procedures (including critical care time) Labs Review Labs Reviewed  CBC WITH DIFFERENTIAL - Abnormal; Notable for the following:    RBC 4.09 (*)    Hemoglobin 12.3 (*)    HCT 36.4 (*)    RDW 16.8 (*)    All  other components within normal limits  BASIC METABOLIC PANEL - Abnormal; Notable for the following:    Sodium 134 (*)    Chloride 95 (*)    Glucose, Bld 107 (*)    GFR calc non Af Amer 88 (*)    All other components within normal limits  COMPREHENSIVE METABOLIC PANEL - Abnormal; Notable for the following:    Albumin 2.6 (*)    Alkaline Phosphatase 136 (*)    GFR calc non Af Amer 86 (*)    All other components within normal limits  PROTIME-INR - Abnormal; Notable for the following:    Prothrombin Time 17.7 (*)    INR 1.50 (*)    All other components within normal limits  CBC - Abnormal; Notable for the following:    RBC 3.68 (*)    Hemoglobin 11.1 (*)    HCT  32.9 (*)    RDW 16.9 (*)    All other components within normal limits  CULTURE, BAL-QUANTITATIVE  AFB CULTURE WITH SMEAR  FUNGUS CULTURE W SMEAR  APTT  CYTOLOGY - NON PAP  CYTOLOGY - NON PAP  CYTOLOGY - NON PAP  CYTOLOGY - NON PAP   Imaging Review No results found.   EKG Interpretation None      MDM   Final diagnoses:  Lung mass  Soft tissue mass  Coagulopathy    Discussion large left hilar mass. The first episode esophageal masses escape the esophagus and it is running tissues. Also the differential would be a primary pulmonary malignancy with mass effect on the esophagus. He takes several toes anticoagulant for paroxysmal A. fib. His last dose was yesterday. He is weak and having difficulty at home. He is having hemoptysis and anticoagulated. Think he'll need consideration for bronchoscopy and/or EGD for tissue biopsy for plan treatment. He is full code. I discussed the findings with his family. I have placed a call to the hospitalist regarding admission.    Tanna Furry, MD 06/26/13 [phone]

## 2013-06-21 NOTE — Progress Notes (Signed)
Patient's daughter Nunzio Cory phone number [phone] (H) and (c) [phone] Patient's son Bryston [phone] and ([phone] Patient's daughter Clarene Critchley [phone] and (c) [phone]

## 2013-06-22 ENCOUNTER — Encounter (HOSPITAL_COMMUNITY): Payer: Self-pay

## 2013-06-22 DIAGNOSIS — E440 Moderate protein-calorie malnutrition: Secondary | ICD-10-CM | POA: Insufficient documentation

## 2013-06-22 DIAGNOSIS — R222 Localized swelling, mass and lump, trunk: Secondary | ICD-10-CM

## 2013-06-22 DIAGNOSIS — I10 Essential (primary) hypertension: Secondary | ICD-10-CM

## 2013-06-22 DIAGNOSIS — I6350 Cerebral infarction due to unspecified occlusion or stenosis of unspecified cerebral artery: Secondary | ICD-10-CM

## 2013-06-22 DIAGNOSIS — D62 Acute posthemorrhagic anemia: Secondary | ICD-10-CM

## 2013-06-22 LAB — COMPREHENSIVE METABOLIC PANEL
ALBUMIN: 2.6 g/dL — AB (ref 3.5–5.2)
ALT: 17 U/L (ref 0–53)
AST: 16 U/L (ref 0–37)
Alkaline Phosphatase: 136 U/L — ABNORMAL HIGH (ref 39–117)
BUN: 13 mg/dL (ref 6–23)
CALCIUM: 9.9 mg/dL (ref 8.4–10.5)
CO2: 27 mEq/L (ref 19–32)
CREATININE: 0.67 mg/dL (ref 0.50–1.35)
Chloride: 99 mEq/L (ref 96–112)
GFR calc Af Amer: >90 mL/min (ref >90)
GFR calc non Af Amer: 86 mL/min — ABNORMAL LOW (ref >90)
Glucose, Bld: 97 mg/dL (ref 70–99)
Potassium: 3.8 mEq/L (ref 3.7–5.3)
Sodium: 138 mEq/L (ref 137–147)
TOTAL PROTEIN: 6.2 g/dL (ref 6.0–8.3)
Total Bilirubin: 0.7 mg/dL (ref 0.3–1.2)

## 2013-06-22 LAB — PROTIME-INR
INR: 1.5 — AB (ref 0.00–1.49)
Prothrombin Time: 17.7 seconds — ABNORMAL HIGH (ref 11.6–15.2)

## 2013-06-22 LAB — APTT: aPTT: 34 seconds (ref 24–37)

## 2013-06-22 MED ORDER — ACETAMINOPHEN 650 MG RE SUPP: 650.0000 mg | Freq: Four times a day (QID) | RECTAL | Status: DC | PRN

## 2013-06-22 MED ORDER — LEVOTHYROXINE SODIUM 75 MCG PO TABS
75.0000 ug | ORAL_TABLET | Freq: Every day | ORAL | Status: DC
  Administered 2013-06-22 – 2013-06-23 (×2): 75 ug via ORAL
  Filled 2013-06-22 (×3): qty 1

## 2013-06-22 MED ORDER — LIP MEDEX EX OINT
TOPICAL_OINTMENT | CUTANEOUS | Status: AC
  Administered 2013-06-22: 1
  Filled 2013-06-22: qty 7

## 2013-06-22 MED ORDER — VITAMIN D3 25 MCG (1000 UNIT) PO TABS
1000.0000 [IU] | ORAL_TABLET | Freq: Every day | ORAL | Status: DC
  Administered 2013-06-26 – 2013-06-28 (×3): 1000 [IU] via ORAL
  Filled 2013-06-22 (×8): qty 1

## 2013-06-22 MED ORDER — ALBUTEROL SULFATE (2.5 MG/3ML) 0.083% IN NEBU
2.5000 mg | INHALATION_SOLUTION | RESPIRATORY_TRACT | Status: DC | PRN
  Administered 2013-06-24: 2.5 mg via RESPIRATORY_TRACT

## 2013-06-22 MED ORDER — METOPROLOL TARTRATE 25 MG PO TABS
25.0000 mg | ORAL_TABLET | Freq: Two times a day (BID) | ORAL | Status: DC
  Administered 2013-06-22 – 2013-06-28 (×7): 25 mg via ORAL
  Filled 2013-06-22 (×18): qty 1

## 2013-06-22 MED ORDER — ADULT MULTIVITAMIN W/MINERALS CH
1.0000 | ORAL_TABLET | Freq: Every day | ORAL | Status: DC
  Administered 2013-06-26 – 2013-06-28 (×3): 1 via ORAL
  Filled 2013-06-22 (×8): qty 1

## 2013-06-22 MED ORDER — ONDANSETRON HCL 4 MG PO TABS: 4.0000 mg | ORAL_TABLET | Freq: Four times a day (QID) | ORAL | Status: DC | PRN

## 2013-06-22 MED ORDER — ACETAMINOPHEN 325 MG PO TABS: 650.0000 mg | ORAL_TABLET | Freq: Four times a day (QID) | ORAL | Status: DC | PRN

## 2013-06-22 MED ORDER — SIMVASTATIN 20 MG PO TABS
20.0000 mg | ORAL_TABLET | Freq: Every day | ORAL | Status: DC
  Administered 2013-06-22 – 2013-06-27 (×3): 20 mg via ORAL
  Filled 2013-06-22 (×8): qty 1

## 2013-06-22 MED ORDER — OXYCODONE HCL 5 MG PO TABS: 5.0000 mg | ORAL_TABLET | ORAL | Status: DC | PRN

## 2013-06-22 MED ORDER — SODIUM CHLORIDE 0.9 % IV SOLN
INTRAVENOUS | Status: DC
  Administered 2013-06-22 (×3): via INTRAVENOUS

## 2013-06-22 MED ORDER — GUAIFENESIN ER 600 MG PO TB12
600.0000 mg | ORAL_TABLET | Freq: Two times a day (BID) | ORAL | Status: DC
  Administered 2013-06-22 – 2013-06-26 (×3): 600 mg via ORAL
  Filled 2013-06-22 (×10): qty 1

## 2013-06-22 MED ORDER — ONDANSETRON HCL 4 MG/2ML IJ SOLN
4.0000 mg | Freq: Four times a day (QID) | INTRAMUSCULAR | Status: DC | PRN
  Administered 2013-06-22 – 2013-07-01 (×3): 4 mg via INTRAVENOUS
  Filled 2013-06-22 (×3): qty 2

## 2013-06-22 MED ORDER — DIGOXIN 125 MCG PO TABS
0.1250 mg | ORAL_TABLET | Freq: Every day | ORAL | Status: DC
  Filled 2013-06-22 (×5): qty 1

## 2013-06-22 NOTE — Consult Note (Signed)
Name: Austin [name] MRN: [id number] DOB: [birth year]    ADMISSION DATE:  06/21/2013 CONSULTATION DATE:  06/21/2013  REFERRING MD :  Dr Daleen Bo PRIMARY SERVICE:  TRH  CHIEF COMPLAINT:  Cough  BRIEF PATIENT DESCRIPTION: 80 year old male with multiple medical problems presents 55 for progressive cough/hemoptysis. CTA chest discovered subcarinal mass. PCCM asked to see.  SIGNIFICANT EVENTS / STUDIES:  3/5 CTA Chest - subcarinal mass 6.4 cmx 3.2 cm AP x 3.6 cm. left axillary region borderline enlarged lymph node measuring 1.4 cm in diameter.  LINES / TUBES: PIV  CULTURES: None   ANTIBIOTICS: None   HISTORY OF PRESENT ILLNESS:  Austin [name] is a 80 y.o. male,presents with complaints of hemoptysis, son and daughter at bedside, they give most of the history, patient has been coughing over the last 2 months, diagnosed with pneumonia, treated with Augmentin, had C. difficile is status post his treatment with by mouth vancomycin before 2 weeks, any diarrhea currently, and had persistent cough over a few weeks, with productive sputum, recently started to develop hemoptysis, minimal amount, mainly in a.m.,no hemoptysis here, patient had CT chest angiogram which did show mediastinal and left hilar mass, reports patient lost 40 pounds over last 5 months, is currently bedridden last few month after femur fracture, it is on Xarelto, time took it yesterday at 6 PM, patient reports dysphagia and cough to solids. PCCM asked to see for subcarinal mass.    PAST MEDICAL HISTORY :  Past Medical History  Diagnosis Date  . Hypertension   . Hyperlipidemia   . AAA (abdominal aortic aneurysm)   . Arthritis   . CAD (coronary artery disease)   . Thyroid disease     hypothyroidism  . Dysrhythmia     atrial flutter  . Shortness of breath     a little with exertion  . Polio 1934  . Atrial flutter     chronic anticoagulation  . COPD (chronic obstructive pulmonary disease)   . History of nuclear  stress test 04/2010    dipyridamole; small, mostly fixed basal to mid inferior and inferoseptal defect (gut artifact v. scar); post stress EF 62%; non-diagnostic for ischemia; low risk    Past Surgical History  Procedure Laterality Date  . Abdominal aortic aneurysm repair  05/21/2010    aorto bi-iliac [facility]  . Appendectomy  1949  . Popliteal synovial cyst excision Left   . Joint replacement      Knee and Hip replacements  . Total hip arthroplasty Left 1994  . Knee arthroplasty Left 2004  . Total hip revision Left 12/04/2012    Procedure: LEFT TOTAL HIP REVISION;  Surgeon: Mauri Pole, MD;  Location: [facility];  Service: Orthopedics;  Laterality: Left;  . Cardiac catheterization  2009    in [place]; 50-60% RCA  . Transthoracic echocardiogram  04/2010    EF =>55%; RV borderline dilated; LA mildly dilated; mild mitral annular calcif & mild MR; mild TR; mild calcif of AV leaflets with mild AV stenosis; aortic root sclerosis/calcif  . Orif femur fracture Left 01/22/2013    Procedure: OPEN REDUCTION INTERNAL FIXATION (ORIF) DISTAL FEMUR FRACTURE;  Surgeon: Mauri Pole, MD;  Location: [place];  Service: Orthopedics;  Laterality: Left;   Prior to Admission medications   Medication Sig Start Date End Date Taking? Authorizing Provider  albuterol (PROVENTIL) (2.5 MG/3ML) 0.083% nebulizer solution Take 2.5 mg by nebulization every 6 (six) hours as needed for wheezing or shortness of breath.  Yes Historical Provider, MD  cholecalciferol (VITAMIN D) 1000 UNITS tablet Take 1,000 Units by mouth daily.   Yes Historical Provider, MD  digoxin (LANOXIN) 0.125 MG tablet Take 1 tablet (0.125 mg total) by mouth daily. 01/28/13  Yes Ripudeep Krystal Eaton, MD  ferrous sulfate 325 (65 FE) MG tablet Take 325 mg by mouth daily with breakfast.   Yes Historical Provider, MD  levothyroxine (SYNTHROID, LEVOTHROID) 75 MCG tablet Take 75 mcg by mouth daily before breakfast.   Yes Historical Provider, MD  lovastatin (MEVACOR) 40 MG  tablet Take 40 mg by mouth daily.    Yes Historical Provider, MD  metoprolol tartrate (LOPRESSOR) 25 MG tablet Take 1 tablet (25 mg total) by mouth 2 (two) times daily. 05/16/13  Yes Shanker Kristeen Mans, MD  Multiple Vitamin (MULTIVITAMIN WITH MINERALS) TABS tablet Take 1 tablet by mouth daily.   Yes Historical Provider, MD  naproxen sodium (ANAPROX) 220 MG tablet Take 220-440 mg by mouth as needed (pain).   Yes Historical Provider, MD  Rivaroxaban (XARELTO) 20 MG TABS Take 20 mg by mouth daily.   Yes Historical Provider, MD   No Known Allergies  FAMILY HISTORY:  Family History  Problem Relation Age of Onset  . Other Father     varicose veins  . Hyperlipidemia Son   . Hypertension Son   . AAA (abdominal aortic aneurysm) Son   . Lung cancer Brother    SOCIAL HISTORY:  reports that he quit smoking about 47 years ago. His smoking use included Cigarettes. He has a 25 pack-year smoking history. He has never used smokeless tobacco. He reports that he drinks alcohol. He reports that he does not use illicit drugs.  REVIEW OF SYSTEMS:   Bolds are positive  Constitutional: 40 lb weight loss last 5 months, gain, night sweats, Fevers, chills, fatigue .  HEENT: headaches, Sore throat, sneezing, nasal congestion, post nasal drip, Difficulty swallowing, Tooth/dental problems, visual complaints visual changes, ear ache CV:  chest pain, radiates: ,Orthopnea, PND, swelling in lower extremities, dizziness, palpitations, syncope.  GI  heartburn, indigestion, abdominal pain, nausea, vomiting, diarrhea, change in bowel habits, loss of appetite, bloody stools.  Resp: cough, productive: , hemoptysis, dyspnea, chest pain, pleuritic.  Skin: rash or itching or icterus GU: dysuria, change in color of urine, urgency or frequency. flank pain, hematuria  MS: joint pain or swelling. decreased range of motion  Psych: change in mood or affect. depression or anxiety.  Neuro: difficulty with speech, weakness, numbness,  ataxia    SUBJECTIVE:   VITAL SIGNS: Temp:  [97.3 F (36.3 C)-98.7 F (37.1 C)] 97.3 F (36.3 C) (03/06 1241) Pulse Rate:  [56-88] 60 (03/06 1241) Resp:  [16] 16 (03/06 1241) BP: (107-136)/(56-83) 126/75 mmHg (03/06 1241) SpO2:  [94 %-98 %] 96 % (03/06 1241) Weight:  [111.3 kg (245 lb 6 oz)] 111.3 kg (245 lb 6 oz) (03/05 2240)  PHYSICAL EXAMINATION: General:  Elderly male in NAD Neuro:  Alert, oriented, answers questions appropriately. Lethargic. HEENT:  [place]/AT Neck:  Supple no JVD Cardiovascular:  RRR Lungs:  Bibasilar rhonchi, L > R Abdomen:  Soft, non-tender Musculoskeletal:  Intact, no acute deformity.  Skin:  Intact   Recent Labs Lab 06/21/13 1640 06/22/13 0440  NA 134* 138  K 4.1 3.8  CL 95* 99  CO2 26 27  BUN 13 13  CREATININE 0.63 0.67  GLUCOSE 107* 97    Recent Labs Lab 06/21/13 1640  HGB 12.3*  HCT 36.4*  WBC 8.4  PLT 185   Ct Angio Chest Pe W/cm &/or Wo Cm  06/22/2013   ADDENDUM REPORT: 06/22/2013 10:45  ADDENDUM: The patient has a large left hilar mass that extends into the mediastinum into the subcarinal region. Its medial portion is inseparable from the abnormal soft tissues at the junction of the mid and distal thirds of the esophagus. It is this portion of the mass that abuts the anterior aspect of the abdominal aorta. The maximal dimensions of the subcarinal mass are approximately 6.4 cm transversely x 3.2 cm AP x 3.6 cm longitudinally.  In the left axillary region there is a borderline enlarged lymph node measuring 1.4 cm in diameter.   Electronically Signed   By: Isabellamarie Randa  [place]   On: 06/22/2013 10:45   06/22/2013   CLINICAL DATA:  Hemoptysis, left lower extremity edema, recent hip surgery  EXAM: CT ANGIOGRAPHY CHEST WITH CONTRAST  TECHNIQUE: Multidetector CT imaging of the chest was performed using the standard protocol during bolus administration of intravenous contrast. Multiplanar CT image reconstructions and MIPs were obtained to evaluate the  vascular anatomy.  CONTRAST:  157mL OMNIPAQUE IOHEXOL 350 MG/ML SOLN  COMPARISON:  DG CHEST 1V PORT dated 05/12/2013  FINDINGS: The contrast bolus is limited in terms of density. No definite intraluminal pulmonary artery defects are demonstrated. There is an abnormal mediastinal and left hilar mass. This is producing significant mass effect upon the proximal left main pulmonary artery, but flow distally is still demonstrated. There is also mass effect upon the esophagus with distention of the proximal esophagus to the level of the carina. There is an anterior left hilar soft tissue mass 3.2 cm transversely by 4.9 cm AP. There is a 2 cm right hilar lymph node on image 48. Definite paratracheal lymphadenopathy is not demonstrated. The cardiac chambers are mildly enlarged. There is no pericardial effusion. The caliber of the thoracic aorta is normal. These soft tissue mass abuts the anterior and left lateral surface of the descending thoracic aorta.  There is a small left pleural effusion. Patchy areas of increased parenchymal density are present in the left upper lobe. Confluent increased density peripherally in the left lower lobe is present. On the right there is subsegmental atelectasis or early infiltrate in the lower lobe. There are emphysematous changes in both lungs.  Within the upper abdomen the observed portions of the liver and spleen appear normal. The adrenal glands are not included in the field of view. The thoracic vertebral bodies are preserved in height. The sternum appears intact. The observed portions of the ribs exhibit no acute abnormalities.  Review of the MIP images confirms the above findings.  IMPRESSION: 1. There is an abnormal mediastinal and left hilar mass producing mass effect upon the left main pulmonary artery. Classic pulmonary emboli are not demonstrated but vascular flow to the left lung is markedly impaired by the large mass. This mass is also resulting in esophageal obstruction with  the proximal and mid portions of the esophagus distended with fluid and gas. The appearance is consistent with a central lung malignancy or malignancy of the distal esophagus. 2. There is a small left pleural effusion. 3. There is postobstructive atelectasis in the left lower lobe. Patchy areas of interstitial density in the left upper lobe are nonspecific but could reflect parenchymal metastatic disease. There is likely atelectasis or early infiltrate in the right lower lobe. 4. These results were called by telephone at the time of interpretation on 06/21/2013 at 6:00 PM to Dr. Tanna Furry ,  who verbally acknowledged these results.  Electronically Signed: By: Jakyria Bleau  [place] On: 06/21/2013 18:02    ASSESSMENT / PLAN:  Subcarinal Lung Mass 6.4 cm  x 3.2 cm AP x 3.6 cm  Hemoptysis  Agree to stop Xarelto Will require goals of care discussion with patient and family, if they wish to pursue diagnosis and treatment FOB vs Wang needle biopsy could be considered approaches.  Attending to follow.    Georgann Housekeeper, ACNP [facility] Pager [phone] or [phone]   06/22/2013, 12:52 PM  PCCM ATTENDING: I have interviewed and examined the patient and reviewed the database. I have formulated the assessment and plan as reflected in the note above with amendments made by me.   I had an extended conversation with pt and his son and daughter. He understands that this is almost certainly bronchogenic cancer and will not be curable. We talked about the possibility of obtaining significant benefit from cancer treatment if a diagnosis is made. I explained that FOB would be the best diagnostic approach with a greater than 90% likelihood of obtaining a definitive dx. We talked about the importance of focusing on QOL over duration of survival and that chemotherapy and/or XRT could potentially favorably impact on this. Lastly, I suggested that we consider enlisting Hospice (regardless of degree of  aggressiveness of treatment) to maximize the support and services available in the home.   I have left a message with Resp Therapy dept (it was after hrs) with goal of performing FOB early Monday afternoon 3/09  Merton Border, MD;  PCCM service; [place] [phone]

## 2013-06-22 NOTE — Progress Notes (Signed)
[facility] PROGRESS NOTE  Austin [name] WCB:[id number] DOB: 1926/09/04 DOA: 06/21/2013 PCP: [name], MD  Assessment/Plan: Active Problems:  Lung mass Hemoptysis  Chest mass  Dysphagia  Atrial fibrillation  Hyperlipemia  Hypertension  COPD  80 y/o male with PMH of HTN, COPD, HPL, A fib, CVA, recent sepsis, recurrent pneumonia, c diff presented with hemoptysis found to have lung mass   1. Hemoptysis Lung mass likely CA (+tobacco use); CT: abnormal mediastinal and left hilar mass producing  mass effect upon the left main pulmonary artery mass is also resulting in esophageal obstruction with the proximal and mid portions of the esophagus The appearance is consistent with a central lung malignancy or malignancy of the distal esophagus. -pulmonary: possible plan for bronchoscopy and biopsy; hold Xarelto  2. Dysphagia likely due mass lesion: keep NPO till swallow evaluation  3. Atrial fibrillation: rate Controlled, continue with metoprolol and digoxin, hold anticoagulation it due to to #1   4. Hypertension: Acceptable continue with metoprolol   5. Hyperlipedemia: cont with statin   6. COPD; h/o tobacco use; no wheezing on exam; cont bronchodilators as needed   7. History of CVA holding Xarelto due to hemoptysis; continue statin  -no new symptoms;   8. History of recent left hip repair  - per notes: Dr. Alvan Dame on 1/28-okay for 50% weightbearing on the left lower extremity. Patient to followup with Dr. Alvan Dame in in one month for repeat x-rays, if continues to do well with good healing, patient will likely be advanced to more weightbearing status   DVT Prophylaxis SCDs    Code Status: DNR Family Communication: d/w patient, updated daughter Austin [name], Austin [name] Daughter [phone]  (indicate person spoken with, relationship, and if by phone, the number) Disposition Plan: pend clinical improvement    Consultants:  Pulmonology   Procedures:  None    Antibiotics:  None  (indicate start date, and stop date if known)  HPI/Subjective: alert  Objective: Filed Vitals:   06/22/13 0940  BP: 136/75  Pulse: 56  Temp:   Resp:     Intake/Output Summary (Last 24 hours) at 06/22/13 1018 Last data filed at 06/22/13 0602  Gross per 24 hour  Intake      0 ml  Output    650 ml  Net   -650 ml   Filed Weights   06/21/13 2240  Weight: 111.3 kg (245 lb 6 oz)    Exam:   General:  alert  Cardiovascular: s1,s2 rrr  Respiratory: LL poor AE  Abdomen: soft, nt, nd   Musculoskeletal: no edema   Data Reviewed: Basic Metabolic Panel:  Recent Labs Lab 06/21/13 1640 06/22/13 0440  NA 134* 138  K 4.1 3.8  CL 95* 99  CO2 26 27  GLUCOSE 107* 97  BUN 13 13  CREATININE 0.63 0.67  CALCIUM 10.0 9.9   Liver Function Tests:  Recent Labs Lab 06/22/13 0440  AST 16  ALT 17  ALKPHOS 136*  BILITOT 0.7  PROT 6.2  ALBUMIN 2.6*   No results found for this basename: LIPASE, AMYLASE,  in the last 168 hours No results found for this basename: AMMONIA,  in the last 168 hours CBC:  Recent Labs Lab 06/21/13 1640  WBC 8.4  NEUTROABS 5.5  HGB 12.3*  HCT 36.4*  MCV 89.0  PLT 185   Cardiac Enzymes: No results found for this basename: CKTOTAL, CKMB, CKMBINDEX, TROPONINI,  in the last 168 hours BNP (last 3 results)  Recent Labs  05/03/13 1319  PROBNP 622.4*   CBG: No results found for this basename: GLUCAP,  in the last 168 hours  No results found for this or any previous visit (from the past 240 hour(s)).   Studies: Ct Angio Chest Pe W/cm &/or Wo Cm  06/21/2013   CLINICAL DATA:  Hemoptysis, left lower extremity edema, recent hip surgery  EXAM: CT ANGIOGRAPHY CHEST WITH CONTRAST  TECHNIQUE: Multidetector CT imaging of the chest was performed using the standard protocol during bolus administration of intravenous contrast. Multiplanar CT image reconstructions and MIPs were obtained to evaluate the vascular anatomy.   CONTRAST:  185mL OMNIPAQUE IOHEXOL 350 MG/ML SOLN  COMPARISON:  DG CHEST 1V PORT dated 05/12/2013  FINDINGS: The contrast bolus is limited in terms of density. No definite intraluminal pulmonary artery defects are demonstrated. There is an abnormal mediastinal and left hilar mass. This is producing significant mass effect upon the proximal left main pulmonary artery, but flow distally is still demonstrated. There is also mass effect upon the esophagus with distention of the proximal esophagus to the level of the carina. There is an anterior left hilar soft tissue mass 3.2 cm transversely by 4.9 cm AP. There is a 2 cm right hilar lymph node on image 48. Definite paratracheal lymphadenopathy is not demonstrated. The cardiac chambers are mildly enlarged. There is no pericardial effusion. The caliber of the thoracic aorta is normal. These soft tissue mass abuts the anterior and left lateral surface of the descending thoracic aorta.  There is a small left pleural effusion. Patchy areas of increased parenchymal density are present in the left upper lobe. Confluent increased density peripherally in the left lower lobe is present. On the right there is subsegmental atelectasis or early infiltrate in the lower lobe. There are emphysematous changes in both lungs.  Within the upper abdomen the observed portions of the liver and spleen appear normal. The adrenal glands are not included in the field of view. The thoracic vertebral bodies are preserved in height. The sternum appears intact. The observed portions of the ribs exhibit no acute abnormalities.  Review of the MIP images confirms the above findings.  IMPRESSION: 1. There is an abnormal mediastinal and left hilar mass producing mass effect upon the left main pulmonary artery. Classic pulmonary emboli are not demonstrated but vascular flow to the left lung is markedly impaired by the large mass. This mass is also resulting in esophageal obstruction with the proximal and  mid portions of the esophagus distended with fluid and gas. The appearance is consistent with a central lung malignancy or malignancy of the distal esophagus. 2. There is a small left pleural effusion. 3. There is postobstructive atelectasis in the left lower lobe. Patchy areas of interstitial density in the left upper lobe are nonspecific but could reflect parenchymal metastatic disease. There is likely atelectasis or early infiltrate in the right lower lobe. 4. These results were called by telephone at the time of interpretation on 06/21/2013 at 6:00 PM to Dr. Tanna Furry , who verbally acknowledged these results.   Electronically Signed   By: David  [place]   On: 06/21/2013 18:02    Scheduled Meds: . cholecalciferol  1,000 Units Oral Daily  . digoxin  0.125 mg Oral Daily  . guaiFENesin  600 mg Oral BID  . levothyroxine  75 mcg Oral QAC breakfast  . metoprolol tartrate  25 mg Oral BID  . multivitamin with minerals  1 tablet Oral Daily  . simvastatin  20 mg Oral q1800  Continuous Infusions: . sodium chloride 75 mL/hr at 06/22/13 5638    Active Problems:   Hemoptysis   Chest mass   Dysphagia   Atrial fibrillation   Hyperlipemia   Hypertension    Time spent: >35 minutes    Kinnie Feil  [facility] Pager [phone]. If 7PM-7AM, please contact night-coverage at [email or web address], password [facility] 06/22/2013, 10:18 AM  LOS: 1 day

## 2013-06-22 NOTE — Progress Notes (Signed)
SLP Cancellation Note  Patient Details Name: Austin [name] MRN: [id number] DOB: Nov 07, 1926   Cancelled treatment:        Unable to complete BSE at this time. Pt is NPO for bronchoscopy.  Chart review revealed MBS completed 05/14/13.  Moderate pharyngeal dysphagia documented, with vallecular and pyriform sinus residue which was penetrated into the airway.  Laryngeal sensation and cough response intact despite voice quality per SLP.  Dys 3 diet with thin liquid was recommended, with multiple swallows and chin tuck to decrease residue.   RN reported finding of mediastinal and hilar mass.  Esophageal obstruction also indicated, which may adversely affect swallow function and safety.  ST to recheck next date. RN aware and in agreement.  Celia B. Quentin Ore [facility], [place]  Shonna Chock 06/22/2013, 11:38 AM

## 2013-06-22 NOTE — Progress Notes (Signed)
INITIAL NUTRITION ASSESSMENT  DOCUMENTATION CODES Per approved criteria  -Non-severe (moderate) malnutrition in the context of chronic illness   INTERVENTION:  Ensure Complete BID, each supplement provides 350 kcal and 13 grams protein as diet advancement tolerated  NUTRITION DIAGNOSIS: Predicted suboptimal energy intake related to poor appetite/dietary preferences as evidenced by patient report and weight loss.   Goal: Patient to meet >/= 90% of estimated nutrition needs  Monitor:  PO diet advancement, weight trends, labs, I/Os  Reason for Assessment: Malnutrition Screening Tool Risk  80 y.o. male  Admitting Dx: Hematemesis  ASSESSMENT: Austin [name] is a 80 y.o. male,presents with complaints of hemoptysis, son and daughter at bedside, they give most of the history, patient has been coughing over the last 2 months, diagnosed with pneumonia, treated with Augmentin, had C. difficile is status post his treatment with by mouth vancomycin before 2 weeks, any diarrhea currently, and had persistent cough over a few weeks, with productive sputum, recently started to develop hemoptysis, minimal amount, mainly in a.m.,no hemoptysis here, patient had CT chest angiogram which did show mediastinal and left hilar mass, reports patient lost 40 pounds over last 5 months, is currently bedridden last few month after femur fracture, it is on Xarelto, time took it yesterday at 6 PM, patient reports dysphagia and cough to solids.  Patient reported losing 40 lbs since his hip fracture in October. Patient attributes weight loss to not liking hospital food and having a poor appetite. Patient stated that he usually has a "healthy [place] appetite." Patient stated that his daughter makes him smoothies with Ensure at home and he usually drinks 1 or 2 per day. Weight is currently trending up from previous documented weights in January 2015. Dietetic intern suspects PO intake will be poor during current  admission due to patient's report of not liking hospital food.    Patient currently NPO until swallow evaluation.  Nutrition Focused Physical Exam:  Subcutaneous Fat:  Orbital Region: mild depletion Upper Arm Region: WNL Thoracic and Lumbar Region: N/A  Muscle:  Temple Region: mild depletion Clavicle Bone Region: moderate depletion Clavicle and Acromion Bone Region: mild depletion Scapular Bone Region: N/A Dorsal Hand: mild depletion  Patellar Region: mild depletion Anterior Thigh Region: mild depletion Posterior Calf Region: mild depletion  Edema: none noted  Patient meets criteria for moderate malnutrition in the context of chronic illness as evidenced by 15% weight loss in 6 months and moderate muscle mass depletion.    Height: Ht Readings from Last 1 Encounters:  06/21/13 6\' 5"  (1.956 m)    Weight: Wt Readings from Last 1 Encounters:  06/21/13 245 lb 6 oz (111.3 kg)    Ideal Body Weight: 208 lb (94.5 kg)  % Ideal Body Weight: 118%  Wt Readings from Last 10 Encounters:  06/21/13 245 lb 6 oz (111.3 kg)  05/12/13 224 lb (101.606 kg)  05/03/13 239 lb 3.2 oz (108.5 kg)  01/20/13 287 lb 14.7 oz (130.6 kg)  01/20/13 287 lb 14.7 oz (130.6 kg)  12/13/12 280 lb (127.007 kg)  12/06/12 314 lb 8 oz (142.656 kg)  12/06/12 314 lb 8 oz (142.656 kg)  11/24/12 287 lb (130.182 kg)  11/15/12 289 lb (131.09 kg)    Usual Body Weight: 285  % Usual Body Weight: 86%  BMI:  Body mass index is 29.09 kg/(m^2).  Estimated Nutritional Needs: Kcal: 2300-2500 Protein: 115-125 grams Fluid:  2.3-2.5 L  Skin: Stage II pressure ulcer on sacrum, wound on back  Diet Order: NPO  EDUCATION NEEDS: -No education needs identified at this time   Intake/Output Summary (Last 24 hours) at 06/22/13 1423 Last data filed at 06/22/13 0602  Gross per 24 hour  Intake      0 ml  Output    650 ml  Net   -650 ml    Last BM: 3/6  Labs:   Recent Labs Lab 06/21/13 1640 06/22/13 0440   NA 134* 138  K 4.1 3.8  CL 95* 99  CO2 26 27  BUN 13 13  CREATININE 0.63 0.67  CALCIUM 10.0 9.9  GLUCOSE 107* 97    CBG (last 3)  No results found for this basename: GLUCAP,  in the last 72 hours  Scheduled Meds: . cholecalciferol  1,000 Units Oral Daily  . digoxin  0.125 mg Oral Daily  . guaiFENesin  600 mg Oral BID  . levothyroxine  75 mcg Oral QAC breakfast  . metoprolol tartrate  25 mg Oral BID  . multivitamin with minerals  1 tablet Oral Daily  . simvastatin  20 mg Oral q1800    Continuous Infusions: . sodium chloride 75 mL/hr at 06/22/13 8315    Past Medical History  Diagnosis Date  . Hypertension   . Hyperlipidemia   . AAA (abdominal aortic aneurysm)   . Arthritis   . CAD (coronary artery disease)   . Thyroid disease     hypothyroidism  . Dysrhythmia     atrial flutter  . Shortness of breath     a little with exertion  . Polio 1934  . Atrial flutter     chronic anticoagulation  . COPD (chronic obstructive pulmonary disease)   . History of nuclear stress test 04/2010    dipyridamole; small, mostly fixed basal to mid inferior and inferoseptal defect (gut artifact v. scar); post stress EF 62%; non-diagnostic for ischemia; low risk     Past Surgical History  Procedure Laterality Date  . Abdominal aortic aneurysm repair  05/21/2010    aorto bi-iliac BPG  . Appendectomy  1949  . Popliteal synovial cyst excision Left   . Joint replacement      Knee and Hip replacements  . Total hip arthroplasty Left 1994  . Knee arthroplasty Left 2004  . Total hip revision Left 12/04/2012    Procedure: LEFT TOTAL HIP REVISION;  Surgeon: Mauri Pole, MD;  Location: [facility];  Service: Orthopedics;  Laterality: Left;  . Cardiac catheterization  2009    in [place]; 50-60% RCA  . Transthoracic echocardiogram  04/2010    EF =>55%; RV borderline dilated; LA mildly dilated; mild mitral annular calcif & mild MR; mild TR; mild calcif of AV leaflets with mild AV stenosis; aortic root  sclerosis/calcif  . Orif femur fracture Left 01/22/2013    Procedure: OPEN REDUCTION INTERNAL FIXATION (ORIF) DISTAL FEMUR FRACTURE;  Surgeon: Mauri Pole, MD;  Location: [place];  Service: Orthopedics;  Laterality: Left;    Claudell Kyle, Dietetic Intern Pager: [phone]  I have reviewed and agree with initial assessment completed by Claudell Kyle, DI [facility] [facility] Clinical Dietitian [phone]

## 2013-06-22 NOTE — Progress Notes (Signed)
PT Cancellation Note  Patient Details Name: Austin [name] MRN: [id number] DOB: 1926-07-16   Cancelled Treatment:     PT Eval deferred - pt with bedrest order to 1242 and scheduled for bronchoscopy later this date.  Will follow in am.   Mariafernanda Hendricksen 06/22/2013, 12:16 PM

## 2013-06-23 ENCOUNTER — Inpatient Hospital Stay (HOSPITAL_COMMUNITY): Payer: Medicare Other

## 2013-06-23 MED ORDER — MORPHINE SULFATE 2 MG/ML IJ SOLN
2.0000 mg | INTRAMUSCULAR | Status: DC | PRN
  Administered 2013-06-30 – 2013-07-02 (×5): 2 mg via INTRAVENOUS
  Filled 2013-06-23 (×5): qty 1

## 2013-06-23 MED ORDER — LEVOTHYROXINE SODIUM 100 MCG IV SOLR
75.0000 ug | Freq: Every day | INTRAVENOUS | Status: DC
  Administered 2013-06-24: 75 ug via INTRAVENOUS
  Filled 2013-06-23 (×3): qty 5

## 2013-06-23 MED ORDER — KCL IN DEXTROSE-NACL 20-5-0.9 MEQ/L-%-% IV SOLN
INTRAVENOUS | Status: DC
  Administered 2013-06-24 – 2013-06-29 (×4): via INTRAVENOUS
  Filled 2013-06-23 (×12): qty 1000

## 2013-06-23 MED ORDER — METOPROLOL TARTRATE 1 MG/ML IV SOLN
2.5000 mg | Freq: Four times a day (QID) | INTRAVENOUS | Status: DC | PRN
  Filled 2013-06-23: qty 5

## 2013-06-23 NOTE — Evaluation (Signed)
Clinical/Bedside Swallow Evaluation Patient Details  Name: Austin [name] MRN: [id number] Date of Birth: 1927/04/12  Today's Date: 06/23/2013 Time: 8295-6213 SLP Time Calculation (min): 33 min  Past Medical History:  Past Medical History  Diagnosis Date  . Hypertension   . Hyperlipidemia   . AAA (abdominal aortic aneurysm)   . Arthritis   . CAD (coronary artery disease)   . Thyroid disease     hypothyroidism  . Dysrhythmia     atrial flutter  . Shortness of breath     a little with exertion  . Polio 1934  . Atrial flutter     chronic anticoagulation  . COPD (chronic obstructive pulmonary disease)   . History of nuclear stress test 04/2010    dipyridamole; small, mostly fixed basal to mid inferior and inferoseptal defect (gut artifact v. scar); post stress EF 62%; non-diagnostic for ischemia; low risk    Past Surgical History:  Past Surgical History  Procedure Laterality Date  . Abdominal aortic aneurysm repair  05/21/2010    aorto bi-iliac BPG  . Appendectomy  1949  . Popliteal synovial cyst excision Left   . Joint replacement      Knee and Hip replacements  . Total hip arthroplasty Left 1994  . Knee arthroplasty Left 2004  . Total hip revision Left 12/04/2012    Procedure: LEFT TOTAL HIP REVISION;  Surgeon: Mauri Pole, MD;  Location: [facility];  Service: Orthopedics;  Laterality: Left;  . Cardiac catheterization  2009    in [place]; 50-60% RCA  . Transthoracic echocardiogram  04/2010    EF =>55%; RV borderline dilated; LA mildly dilated; mild mitral annular calcif & mild MR; mild TR; mild calcif of AV leaflets with mild AV stenosis; aortic root sclerosis/calcif  . Orif femur fracture Left 01/22/2013    Procedure: OPEN REDUCTION INTERNAL FIXATION (ORIF) DISTAL FEMUR FRACTURE;  Surgeon: Mauri Pole, MD;  Location: [place];  Service: Orthopedics;  Laterality: Left;   HPI:  80 yo male adm to [facility] with hemopytsis.  Pt found to have subcarinal mass with esophageal obstruction  proximal and mid with gas noted, Chest imaging showed post obstructive atelectasis LLL, ? RLL infection/ ATX.  Pt reports 43 pound weight loss since Oct 2014 and aphonia x 2 1/2 months with gradual onset and no worsening.  Pt also with choking on food more than drink per his report.  RN reports pt coughing with medicine.  BSE ordered.  Pt also has h/o polio per chart review.   Assessment / Plan / Recommendation Clinical Impression  Pt presents with concern for possible vagal nerve involvement impacting vocal chord adduction /airway protection. Clinical indications of dysphagia, + aspiration noted even with oral moisture via toothette characterized by immediate cough after swallow.  Pt with complex issues given suspected masses impacting esophageal clearance with possible obstruction, therefore suspect multfactorial issues.  Options discussed with Md to defer further testing until after further work up (bronch for Monday) or to proceed with MBS to allow oropharyngeal eval (although esoph obstruction may prevent pt from being able to eat as well).   Order received to proceed with MBS to allow instrumental pharyngeal evaluation.  Will proceed as ordered.  Thanks.     Aspiration Risk  Severe    Diet Recommendation NPO        Other  Recommendations Recommended Consults: MBS   Follow Up Recommendations    TBD   Frequency and Duration  (TBD)  Pertinent Vitals/Pain Afebrile, aphonic essentially      Swallow Study Prior Functional Status  Type of Home: House Available Help at Discharge: Available 24 hours/day    General Date of Onset: 06/23/13 HPI: 80 yo male adm to [facility] with hemopytsis.  Pt found to have subcarinal mass with esophageal obstruction proximal and mid with gas noted, Chest imaging showed post obstructive atelectasis LLL, ? RLL infection/ ATX.  Pt reports 43 pound weight loss since Oct 2014 and aphonia x 2 1/2 months with gradual onset and no worsening.  Pt also with choking on  food more than drink per his report.  RN reports pt coughing with medicine.  BSE ordered.  Type of Study: Bedside swallow evaluation Diet Prior to this Study: NPO Temperature Spikes Noted: No Respiratory Status: Nasal cannula History of Recent Intubation: No Behavior/Cognition: Alert;Cooperative;Pleasant mood Oral Cavity - Dentition: Missing dentition (few teeth, xerostomic) Self-Feeding Abilities: Able to feed self Patient Positioning: Upright in bed Baseline Vocal Quality: Low vocal intensity;Breathy Volitional Cough: Weak Volitional Swallow: Able to elicit (after mouth moistened)    Oral/Motor/Sensory Function Overall Oral Motor/Sensory Function:  (slight decreased right facial, labial movement, vagus nerve involvement apparent c/b hoarseness, palatal deviation) Labial ROM: Reduced right Facial ROM: Reduced right Velum:  (deviates to left upon phonation) Mandible: Within Functional Limits   Ice Chips Ice chips: Impaired Presentation: Spoon Pharyngeal Phase Impairments: Decreased hyoid-laryngeal movement;Cough - Delayed   Thin Liquid Thin Liquid: Impaired Presentation: Cup;Self Fed Pharyngeal  Phase Impairments: Cough - Immediate;Decreased hyoid-laryngeal movement;Multiple swallows Other Comments: cough even with oral moisture from toothette    Nectar Thick Nectar Thick Liquid: Not tested   Honey Thick Honey Thick Liquid: Not tested   Puree Puree: Impaired (icecream bolus) Presentation: Spoon;Self Fed Pharyngeal Phase Impairments: Suspected delayed Swallow;Decreased hyoid-laryngeal movement;Multiple swallows;Cough - Delayed   Solid   GO    Solid: Not tested       Luanna Salk, [place] [facility] SLP [phone]

## 2013-06-23 NOTE — Evaluation (Signed)
Physical Therapy Evaluation Patient Details Name: Austin [name] MRN: [id number] DOB: March 12, 1927 Today's Date: 06/23/2013 Time: 2297-9892 PT Time Calculation (min): 38 min  PT Assessment / Plan / Recommendation History of Present Illness    Austin [name] is a 80 y.o. male,presents with complaints of hemoptysis, son and daughter at bedside, they give most of the history, patient has been coughing over the last 2 months, diagnosed with pneumonia, treated with Augmentin, had C. difficile is status post his treatment with by mouth vancomycin before 2 weeks, any diarrhea currently, and had persistent cough over a few weeks, with productive sputum, recently started to develop hemoptysis, minimal amount, mainly in a.m.,no hemoptysis here, patient had CT chest angiogram which did show mediastinal and left hilar mass, reports patient lost 40 pounds over last 5 months, is currently bedridden last few month after femur fracture, it is on Xarelto, time took it yesterday at 6 PM, patient reports dysphagia and cough to solids.      Clinical Impression  Pt motivated to participate and stating he has just started standing at bedside with HHPT after several months following L femur fx.  Pt currently requiring assist x 2 for mobility and progressed to bedside sitting and wt shifts this date.  Standing deferred pending clarification of WB order.  Pt states 50% PWB but NWB order on chart.  Pt plans d/c home with dtr at d/c and would benefit from continued HHPT    PT Assessment  Patient needs continued PT services    Follow Up Recommendations  Home health PT    Does the patient have the potential to tolerate intense rehabilitation      Barriers to Discharge        Equipment Recommendations  None recommended by PT    Recommendations for Other Services     Frequency Min 3X/week    Precautions / Restrictions Precautions Precautions: Fall;Other (comment) Restrictions Weight Bearing Restrictions:  Yes LLE Weight Bearing: Non weight bearing   Pertinent Vitals/Pain No pain at rest.  4/10 L LE pain after ~ 10 min bedside sitting.      Mobility  Bed Mobility Overal bed mobility: +2 for physical assistance;Needs Assistance Bed Mobility: Supine to Sit;Sit to Supine Supine to sit: Mod assist;+2 for physical assistance Sit to supine: Mod assist;+2 for physical assistance General bed mobility comments: cues for use of R LE to self assist and physical assist to manage L LE and to bring trunk to upright Transfers Overall transfer level:  (NT pending clarification of WB status)    Exercises     PT Diagnosis: Difficulty walking  PT Problem List: Decreased strength;Decreased range of motion;Decreased activity tolerance;Decreased mobility;Decreased knowledge of use of DME;Pain PT Treatment Interventions: DME instruction;Functional mobility training;Gait training;Therapeutic activities;Therapeutic exercise;Patient/family education     PT Goals(Current goals can be found in the care plan section) Acute Rehab PT Goals Patient Stated Goal: Return home with dtr and continue at home rehab PT Goal Formulation: With patient Time For Goal Achievement: 07/07/13 Potential to Achieve Goals: Good  Visit Information  Last PT Received On: 06/23/13 Assistance Needed: +2       Prior [place] expects to be discharged to:: Private residence Living Arrangements: Children Available Help at Discharge: Available 24 hours/day Type of Home: House Home Access: Stairs to enter [employer] of Steps: 1 Home Layout: Two level;Able to live on main level with bedroom/bathroom Home Equipment: Hospital bed;Wheelchair - [employer] - 2 wheels;Cane - single point Additional  Comments: Pt plans d/c home with dtr Prior Function Level of Independence: Needs assistance Gait / Transfers Assistance Needed: since L hip ORIF pt nonambulatory and family using hoyer lift for  transfers from bed to w/c or chair. Comments: Pt states he was just starting to work on standing.  Pt states he was 50% PWB at home Communication Communication: No difficulties Dominant Hand: Right    Cognition  Cognition Arousal/Alertness: Awake/alert Behavior During Therapy: WFL for tasks assessed/performed Overall Cognitive Status: Within Functional Limits for tasks assessed    Extremity/Trunk Assessment Upper Extremity Assessment Upper Extremity Assessment: Overall WFL for tasks assessed Lower Extremity Assessment Lower Extremity Assessment: RLE deficits/detail;LLE deficits/detail RLE Deficits / Details: ~4-/5 strength with hip flex ltd to ~80 and abd to ~15 LLE Deficits / Details: 3/5 strength with pt lifting L LE off bed without assist.  Hip flex ltd  to ~80 and abd to ~15 Cervical / Trunk Assessment Cervical / Trunk Assessment: Normal   Balance    End of Session PT - End of Session Activity Tolerance: Patient tolerated treatment well;Patient limited by pain;Patient limited by fatigue Patient left: in bed;with call bell/phone within reach Nurse Communication: Mobility status  GP     Deserie Dirks 06/23/2013, 12:48 PM

## 2013-06-23 NOTE — Progress Notes (Addendum)
[facility] PROGRESS NOTE  Austin [name] ERD:[id number] DOB: 20-Aug-1926 DOA: 06/21/2013 PCP: [name], MD  Assessment/Plan: Active Problems:  Lung mass Hemoptysis  Chest mass  Dysphagia  Atrial fibrillation  Hyperlipemia  Hypertension  COPD  80 y/o male with PMH of HTN, COPD, HPL, A fib, CVA, recent sepsis, recurrent pneumonia, c diff presented with hemoptysis found to have lung mass   1. Hemoptysis Lung mass likely lung CA (+tobacco use);  - CT: abnormal mediastinal and left hilar mass producing  mass effect upon the left main pulmonary artery mass is also resulting in esophageal obstruction with the proximal and mid portions of the esophagus The appearance is consistent with a central lung malignancy or malignancy of the distal esophagus. - pulmonary: possible plan for bronchoscopy and biopsy monday; hold Xarelto - prognosis guarded; asked palliative care consultation   2. Dysphagia likely due mass lesion: failed swallow evaluation; at risk for aspiration, recent h/p aspiration PNA - ? Mass effect vs esophageal involvement; will obtain esophagogram; NPO, IVF, may need GI eval/EGD;  -d/w patient, famiyl about PEG vs comfort feeding; they will think  About it   3. Atrial fibrillation: rate Controlled, continue with metoprolol and digoxin, hold anticoagulation it due to to #1   4. Hypertension: Acceptable continue with metoprolol   5. Hyperlipedemia: cont with statin   6. COPD; h/o tobacco use; no wheezing on exam; cont bronchodilators as needed   7. History of CVA holding Xarelto due to hemoptysis; continue statin  -no new symptoms;   8. History of recent left hip repair  - per notes: Dr. Alvan Dame on 1/28-okay for 50% weightbearing on the left lower extremity. Patient to followup with Dr. Alvan Dame in in one month for repeat x-rays, if continues to do well with good healing, patient will likely be advanced to more weightbearing status -xray: 1/28: Healing fracture  involving the distal shaft of the left femur status post sideplate and screw device reduction  Prognosis is guarded, family is interested in learning about hospice care  DVT Prophylaxis SCDs    Code Status: DNR Family Communication: d/w patient, updated daughter [name], [name] Daughter [phone]  (indicate person spoken with, relationship, and if by phone, the number) Disposition Plan: pend clinical improvement    Consultants:  Pulmonology   Procedures:  None   Antibiotics:  None  (indicate start date, and stop date if known)  HPI/Subjective: alert  Objective: Filed Vitals:   06/23/13 0549  BP: 112/74  Pulse: 87  Temp: 98.2 F (36.8 C)  Resp: 16    Intake/Output Summary (Last 24 hours) at 06/23/13 1139 Last data filed at 06/23/13 0128  Gross per 24 hour  Intake      0 ml  Output   1200 ml  Net  -1200 ml   Filed Weights   06/21/13 2240  Weight: 111.3 kg (245 lb 6 oz)    Exam:   General:  alert  Cardiovascular: s1,s2 rrr  Respiratory: LL poor AE  Abdomen: soft, nt, nd   Musculoskeletal: no edema   Data Reviewed: Basic Metabolic Panel:  Recent Labs Lab 06/21/13 1640 06/22/13 0440  NA 134* 138  K 4.1 3.8  CL 95* 99  CO2 26 27  GLUCOSE 107* 97  BUN 13 13  CREATININE 0.63 0.67  CALCIUM 10.0 9.9   Liver Function Tests:  Recent Labs Lab 06/22/13 0440  AST 16  ALT 17  ALKPHOS 136*  BILITOT 0.7  PROT 6.2  ALBUMIN 2.6*  No results found for this basename: LIPASE, AMYLASE,  in the last 168 hours No results found for this basename: AMMONIA,  in the last 168 hours CBC:  Recent Labs Lab 06/21/13 1640  WBC 8.4  NEUTROABS 5.5  HGB 12.3*  HCT 36.4*  MCV 89.0  PLT 185   Cardiac Enzymes: No results found for this basename: CKTOTAL, CKMB, CKMBINDEX, TROPONINI,  in the last 168 hours BNP (last 3 results)  Recent Labs  05/03/13 1319  PROBNP 622.4*   CBG: No results found for this basename: GLUCAP,  in  the last 168 hours  No results found for this or any previous visit (from the past 240 hour(s)).   Studies: Ct Angio Chest Pe W/cm &/or Wo Cm  06/22/2013   ADDENDUM REPORT: 06/22/2013 10:45  ADDENDUM: The patient has a large left hilar mass that extends into the mediastinum into the subcarinal region. Its medial portion is inseparable from the abnormal soft tissues at the junction of the mid and distal thirds of the esophagus. It is this portion of the mass that abuts the anterior aspect of the abdominal aorta. The maximal dimensions of the subcarinal mass are approximately 6.4 cm transversely x 3.2 cm AP x 3.6 cm longitudinally.  In the left axillary region there is a borderline enlarged lymph node measuring 1.4 cm in diameter.   Electronically Signed   By: David  Martinique   On: 06/22/2013 10:45   06/22/2013   CLINICAL DATA:  Hemoptysis, left lower extremity edema, recent hip surgery  EXAM: CT ANGIOGRAPHY CHEST WITH CONTRAST  TECHNIQUE: Multidetector CT imaging of the chest was performed using the standard protocol during bolus administration of intravenous contrast. Multiplanar CT image reconstructions and MIPs were obtained to evaluate the vascular anatomy.  CONTRAST:  141mL OMNIPAQUE IOHEXOL 350 MG/ML SOLN  COMPARISON:  DG CHEST 1V PORT dated 05/12/2013  FINDINGS: The contrast bolus is limited in terms of density. No definite intraluminal pulmonary artery defects are demonstrated. There is an abnormal mediastinal and left hilar mass. This is producing significant mass effect upon the proximal left main pulmonary artery, but flow distally is still demonstrated. There is also mass effect upon the esophagus with distention of the proximal esophagus to the level of the carina. There is an anterior left hilar soft tissue mass 3.2 cm transversely by 4.9 cm AP. There is a 2 cm right hilar lymph node on image 48. Definite paratracheal lymphadenopathy is not demonstrated. The cardiac chambers are mildly enlarged. There  is no pericardial effusion. The caliber of the thoracic aorta is normal. These soft tissue mass abuts the anterior and left lateral surface of the descending thoracic aorta.  There is a small left pleural effusion. Patchy areas of increased parenchymal density are present in the left upper lobe. Confluent increased density peripherally in the left lower lobe is present. On the right there is subsegmental atelectasis or early infiltrate in the lower lobe. There are emphysematous changes in both lungs.  Within the upper abdomen the observed portions of the liver and spleen appear normal. The adrenal glands are not included in the field of view. The thoracic vertebral bodies are preserved in height. The sternum appears intact. The observed portions of the ribs exhibit no acute abnormalities.  Review of the MIP images confirms the above findings.  IMPRESSION: 1. There is an abnormal mediastinal and left hilar mass producing mass effect upon the left main pulmonary artery. Classic pulmonary emboli are not demonstrated but vascular flow to the  left lung is markedly impaired by the large mass. This mass is also resulting in esophageal obstruction with the proximal and mid portions of the esophagus distended with fluid and gas. The appearance is consistent with a central lung malignancy or malignancy of the distal esophagus. 2. There is a small left pleural effusion. 3. There is postobstructive atelectasis in the left lower lobe. Patchy areas of interstitial density in the left upper lobe are nonspecific but could reflect parenchymal metastatic disease. There is likely atelectasis or early infiltrate in the right lower lobe. 4. These results were called by telephone at the time of interpretation on 06/21/2013 at 6:00 PM to Dr. Tanna Furry , who verbally acknowledged these results.  Electronically Signed: By: David  Martinique On: 06/21/2013 18:02    Scheduled Meds: . cholecalciferol  1,000 Units Oral Daily  . digoxin  0.125  mg Oral Daily  . guaiFENesin  600 mg Oral BID  . levothyroxine  75 mcg Oral QAC breakfast  . metoprolol tartrate  25 mg Oral BID  . multivitamin with minerals  1 tablet Oral Daily  . simvastatin  20 mg Oral q1800   Continuous Infusions: . sodium chloride 75 mL/hr at 06/22/13 2029    Active Problems:   Hemoptysis   Chest mass   Dysphagia   Atrial fibrillation   Hyperlipemia   Hypertension   Malnutrition of moderate degree    Time spent: >35 minutes    Kinnie Feil  [facility] Pager [phone]. If 7PM-7AM, please contact night-coverage at [email or web address], password [facility] 06/23/2013, 11:39 AM  LOS: 2 days

## 2013-06-23 NOTE — Progress Notes (Signed)
Thank you for consulting the Palliative Medicine Team at [facility] to meet your patient's and family's needs.   The reason that you asked [place] to see your patient is  For GOC/Hospice  We have scheduled your patient for a meeting: 3/8 at 930 am  The Surrogate decision make is: Patient and Daughter Zorian Gunderman Contact information:[phone]  Other family members that need to be present:  Son at their discretion   Your patient is able/unable to participate: able  Additional Narrative:   Del Overfelt L. Lovena Le, MD MBA The Palliative Medicine Team at [facility] Phone: [phone] Pager: [phone]

## 2013-06-23 NOTE — Procedures (Addendum)
Objective Swallowing Evaluation: Modified Barium Swallowing Study  Patient Details  Name: Austin [name] MRN: [id number] Date of Birth: July 16, 1926  Today's Date: 06/23/2013 Time: 0350-0938 SLP Time Calculation (min): 52 min  Past Medical History:  Past Medical History  Diagnosis Date  . Hypertension   . Hyperlipidemia   . AAA (abdominal aortic aneurysm)   . Arthritis   . CAD (coronary artery disease)   . Thyroid disease     hypothyroidism  . Dysrhythmia     atrial flutter  . Shortness of breath     a little with exertion  . Polio 1934  . Atrial flutter     chronic anticoagulation  . COPD (chronic obstructive pulmonary disease)   . History of nuclear stress test 04/2010    dipyridamole; small, mostly fixed basal to mid inferior and inferoseptal defect (gut artifact v. scar); post stress EF 62%; non-diagnostic for ischemia; low risk    Past Surgical History:  Past Surgical History  Procedure Laterality Date  . Abdominal aortic aneurysm repair  05/21/2010    aorto bi-iliac B[facility]  . Appendectomy  1949  . Popliteal synovial cyst excision Left   . Joint replacement      Knee and Hip replacements  . Total hip arthroplasty Left 1994  . Knee arthroplasty Left 2004  . Total hip revision Left 12/04/2012    Procedure: LEFT TOTAL HIP REVISION;  Surgeon: Mauri Pole, MD;  Location: [facility];  Service: Orthopedics;  Laterality: Left;  . Cardiac catheterization  2009    in [place]; 50-60% RCA  . Transthoracic echocardiogram  04/2010    EF =>55%; RV borderline dilated; LA mildly dilated; mild mitral annular calcif & mild MR; mild TR; mild calcif of AV leaflets with mild AV stenosis; aortic root sclerosis/calcif  . Orif femur fracture Left 01/22/2013    Procedure: OPEN REDUCTION INTERNAL FIXATION (ORIF) DISTAL FEMUR FRACTURE;  Surgeon: Mauri Pole, MD;  Location: [facility];  Service: Orthopedics;  Laterality: Left;   HPI:  80 yo male adm to [facility] with hemopytsis.  Pt found to have subcarinal  mass with esophageal obstruction proximal and mid with gas noted, Chest imaging showed post obstructive atelectasis LLL, ? RLL infection/ ATX.  Pt reports 43 pound weight loss since Oct 2014 and aphonia x 2 1/2 months with gradual onset and no worsening.  Pt also with choking on food more than drink per his report.  RN reports pt coughing with medicine.  BSE ordered.      Assessment / Plan / Recommendation Clinical Impression  Dysphagia Diagnosis: Mild oral phase dysphagia;Moderate pharyngeal phase dysphagia;Suspected primary esophageal dysphagia;Moderate cervical esophageal phase dysphagia   Clinical impression: Mild oral and moderate pharyngeal-cervical esophageal dysphagia with suspected primary esophageal deficits.  Pt has sensorimotor deficits resulting in weak muscle contraction and pharyngeal stasis without pt awareness.  Laryngeal elevation was very poor negatively impacting airway protection and UES opening.   Chin tuck posture along with dry swallows decreased amount of pharyngeal stasis of liquids.  Head turn right with chin tuck not effective.  Laryngeal penetration (silent) of small amount of barium noted after swallow without pt awareness- presumed from pyriform residuals spilling into open larynx.    Pt coughed during MBS without barium visualized in larynx - suspect possible secretion aspiration occuring based on secretion stasis in pharynx.    SLP only tested liquids due to appearance of stasis and narrowing in mid-esophagus that did NOT clear with multiple swallows or liquids. Diffiuclt situation  due to multifactorial dysphagia as pt with at risk of aspiration due to oropharyngeal weakness and suspected esophageal component (appearance of narrowing with retained secretions above - ? from extrinsic compression from mass.  Please note radiologist not present during MBS to confirm findings.      Recommend medicine be given IV and pt be allowed SMALL amounts of tsps of thin water (?  clears) with chin tuck and dry swallows for comfort.    Rec dc intake if pt coughing or senses stasis *he did not have sensation to poor clearance during entire test.  Pt given oral suction and SLP demonstrated use to help clear secretions from pharynx if able to cough.  As pt reports h/o dysphagia and coughing up secretions x3 weeks, suspect acute on chronic dysphagia.       Treatment Recommendation  Therapy as outlined in treatment plan below    Diet Recommendation  (? single tsps clears with chin tuck - SMALL SINGLE TSPS ONLY)   Liquid Administration via: Spoon Medication Administration: Via alternative means Compensations: Multiple dry swallows after each bite/sip;Hard cough after swallow Postural Changes and/or Swallow Maneuvers: Seated upright 90 degrees;Upright 30-60 min after meal;Chin tuck    Other  Recommendations Recommended Consults: MBS Oral Care Recommendations: Oral care Q4 per protocol   Follow Up Recommendations   ? Referral to address possible masses causing extrinsic obstruction on esophagus   Frequency and Duration min 2x/week  2 weeks        General Date of Onset: 06/23/13 HPI: 80 yo male adm to [facility] with hemopytsis.  Pt found to have subcarinal mass with esophageal obstruction proximal and mid with gas noted, Chest imaging showed post obstructive atelectasis LLL, ? RLL infection/ ATX.  Pt reports 43 pound weight loss since Oct 2014 and aphonia x 2 1/2 months with gradual onset and no worsening.  Pt also with choking on food more than drink per his report.  RN reports pt coughing with medicine.  BSE ordered.  Type of Study: Modified Barium Swallowing Study Reason for Referral: Objectively evaluate swallowing function Diet Prior to this Study: NPO Temperature Spikes Noted: No Respiratory Status: Nasal cannula History of Recent Intubation: No Behavior/Cognition: Alert;Cooperative;Pleasant mood Oral Cavity - Dentition: Missing dentition (few teeth) Oral Motor /  Sensory Function: Impaired - see Bedside swallow eval Self-Feeding Abilities: Able to feed self Patient Positioning: Upright in chair Baseline Vocal Quality: Low vocal intensity;Breathy Volitional Cough: Weak Volitional Swallow: Able to elicit Pharyngeal Secretions: Standing secretions in (comment) (pharynx)    Reason for Referral Objectively evaluate swallowing function   Oral Phase Oral Preparation/Oral Phase Oral Phase: Impaired Oral - Nectar Oral - Nectar Teaspoon: Reduced posterior propulsion;Lingual/palatal residue;Weak lingual manipulation Oral - Nectar Straw: Reduced posterior propulsion;Lingual/palatal residue;Weak lingual manipulation Oral - Thin Oral - Thin Teaspoon: Reduced posterior propulsion;Lingual/palatal residue;Weak lingual manipulation Oral - Thin Cup: Reduced posterior propulsion;Lingual/palatal residue;Weak lingual manipulation Oral - Thin Straw: Reduced posterior propulsion;Lingual/palatal residue;Weak lingual manipulation   Pharyngeal Phase Pharyngeal Phase Pharyngeal Phase: Impaired Pharyngeal - Nectar Pharyngeal - Nectar Teaspoon: Reduced epiglottic inversion;Reduced pharyngeal peristalsis;Reduced anterior laryngeal mobility;Reduced laryngeal elevation;Pharyngeal residue - pyriform sinuses;Pharyngeal residue - valleculae;Trace aspiration;Pharyngeal residue - cp segment Pharyngeal - Nectar Cup: Reduced epiglottic inversion;Reduced pharyngeal peristalsis;Reduced anterior laryngeal mobility;Reduced laryngeal elevation;Pharyngeal residue - valleculae;Pharyngeal residue - pyriform sinuses;Reduced tongue base retraction;Pharyngeal residue - cp segment;Penetration/Aspiration after swallow Penetration/Aspiration details (nectar cup): Material enters airway, remains ABOVE vocal cords and not ejected out Pharyngeal - Thin Pharyngeal - Thin Teaspoon: Reduced epiglottic inversion;Reduced  pharyngeal peristalsis;Reduced anterior laryngeal mobility;Reduced laryngeal  elevation;Pharyngeal residue - valleculae;Pharyngeal residue - pyriform sinuses;Reduced tongue base retraction;Pharyngeal residue - cp segment Pharyngeal - Thin Cup: Reduced epiglottic inversion;Reduced pharyngeal peristalsis;Reduced anterior laryngeal mobility;Reduced laryngeal elevation;Pharyngeal residue - valleculae;Pharyngeal residue - pyriform sinuses;Reduced tongue base retraction;Pharyngeal residue - cp segment Pharyngeal - Thin Straw: Reduced epiglottic inversion;Reduced pharyngeal peristalsis;Reduced anterior laryngeal mobility;Reduced laryngeal elevation;Pharyngeal residue - valleculae;Pharyngeal residue - pyriform sinuses;Reduced tongue base retraction;Pharyngeal residue - cp segment Pharyngeal Phase - Comment Pharyngeal Comment: chin tuck with liquids decreases amount of pharyngeal accumulation, cued dry swallows - pt able to cough and "hock" up mild amount of stasis x1, does not sense pharygneal stasis  Cervical Esophageal Phase    GO    Cervical Esophageal Phase Cervical Esophageal Phase: Impaired Cervical Esophageal Phase - Nectar Nectar Teaspoon: Reduced cricopharyngeal relaxation Nectar Cup: Reduced cricopharyngeal relaxation Cervical Esophageal Phase - Thin Thin Teaspoon: Reduced cricopharyngeal relaxation Thin Cup: Reduced cricopharyngeal relaxation Thin Straw: Reduced cricopharyngeal relaxation         Luanna Salk, [facility] SLP [phone]

## 2013-06-24 DIAGNOSIS — I714 Abdominal aortic aneurysm, without rupture, unspecified: Secondary | ICD-10-CM

## 2013-06-24 DIAGNOSIS — C3400 Malignant neoplasm of unspecified main bronchus: Secondary | ICD-10-CM

## 2013-06-24 DIAGNOSIS — E43 Unspecified severe protein-calorie malnutrition: Secondary | ICD-10-CM

## 2013-06-24 DIAGNOSIS — J690 Pneumonitis due to inhalation of food and vomit: Secondary | ICD-10-CM

## 2013-06-24 DIAGNOSIS — D689 Coagulation defect, unspecified: Secondary | ICD-10-CM

## 2013-06-24 DIAGNOSIS — M799 Soft tissue disorder, unspecified: Secondary | ICD-10-CM

## 2013-06-24 DIAGNOSIS — I10 Essential (primary) hypertension: Secondary | ICD-10-CM

## 2013-06-24 LAB — CBC
HCT: 32.9 % — ABNORMAL LOW (ref 39.0–52.0)
Hemoglobin: 11.1 g/dL — ABNORMAL LOW (ref 13.0–17.0)
MCH: 30.2 pg (ref 26.0–34.0)
MCHC: 33.7 g/dL (ref 30.0–36.0)
MCV: 89.4 fL (ref 78.0–100.0)
PLATELETS: 181 10*3/uL (ref 150–400)
RBC: 3.68 MIL/uL — ABNORMAL LOW (ref 4.22–5.81)
RDW: 16.9 % — ABNORMAL HIGH (ref 11.5–15.5)
WBC: 6.9 10*3/uL (ref 4.0–10.5)

## 2013-06-24 NOTE — Consult Note (Signed)
Referring Provider: Dr. Daleen Bo Primary Care Physician:  [name], MD Primary Gastroenterologist:  UNASSIGNED  Reason for Consultation:  Dysphagia  HPI: Austin [name] is a 80 y.o. male with intermittent solid-food dysphagia for the past month along with severe weight loss of 40 pounds in the past 5 months. He has been having recurrent coughing for the past 2 months (coughing with ingestion of solid foods) and was recently diagnosed with pneumonia and developed C. Diff, which was treated with PO Vancomycin. Recently he developed hemoptysis that was described as a small amount. Denies any trouble swallowing liquids. Reports 2 episodes of vomiting in the past 2 weeks but states he may not remember the other episodes. He has been bedridden due to a femur fracture and was on Xarelto prior admit. CT angiogram showed a left hilar mass and he failed a modified barium swallow. Barium swallow today shows a "near complete obstruction of the mid esophagus with irregular tapering of the esophagus at this level concerning for esophageal mass/ neoplasm." Patient currently resting in bed and very lethargic. No family at bedside.    Past Medical History  Diagnosis Date  . Hypertension   . Hyperlipidemia   . AAA (abdominal aortic aneurysm)   . Arthritis   . CAD (coronary artery disease)   . Thyroid disease     hypothyroidism  . Dysrhythmia     atrial flutter  . Shortness of breath     a little with exertion  . Polio 1934  . Atrial flutter     chronic anticoagulation  . COPD (chronic obstructive pulmonary disease)   . History of nuclear stress test 04/2010    dipyridamole; small, mostly fixed basal to mid inferior and inferoseptal defect (gut artifact v. scar); post stress EF 62%; non-diagnostic for ischemia; low risk     Past Surgical History  Procedure Laterality Date  . Abdominal aortic aneurysm repair  05/21/2010    aorto bi-iliac BPG  . Appendectomy  1949  . Popliteal synovial cyst  excision Left   . Joint replacement      Knee and Hip replacements  . Total hip arthroplasty Left 1994  . Knee arthroplasty Left 2004  . Total hip revision Left 12/04/2012    Procedure: LEFT TOTAL HIP REVISION;  Surgeon: Mauri Pole, MD;  Location: [facility];  Service: Orthopedics;  Laterality: Left;  . Cardiac catheterization  2009    in [place]; 50-60% RCA  . Transthoracic echocardiogram  04/2010    EF =>55%; RV borderline dilated; LA mildly dilated; mild mitral annular calcif & mild MR; mild TR; mild calcif of AV leaflets with mild AV stenosis; aortic root sclerosis/calcif  . Orif femur fracture Left 01/22/2013    Procedure: OPEN REDUCTION INTERNAL FIXATION (ORIF) DISTAL FEMUR FRACTURE;  Surgeon: Mauri Pole, MD;  Location: [place];  Service: Orthopedics;  Laterality: Left;    Prior to Admission medications   Medication Sig Start Date End Date Taking? Authorizing Provider  albuterol (PROVENTIL) (2.5 MG/3ML) 0.083% nebulizer solution Take 2.5 mg by nebulization every 6 (six) hours as needed for wheezing or shortness of breath.   Yes Historical Provider, MD  cholecalciferol (VITAMIN D) 1000 UNITS tablet Take 1,000 Units by mouth daily.   Yes Historical Provider, MD  digoxin (LANOXIN) 0.125 MG tablet Take 1 tablet (0.125 mg total) by mouth daily. 01/28/13  Yes Ripudeep Krystal Eaton, MD  ferrous sulfate 325 (65 FE) MG tablet Take 325 mg by mouth daily with breakfast.  Yes Historical Provider, MD  levothyroxine (SYNTHROID, LEVOTHROID) 75 MCG tablet Take 75 mcg by mouth daily before breakfast.   Yes Historical Provider, MD  lovastatin (MEVACOR) 40 MG tablet Take 40 mg by mouth daily.    Yes Historical Provider, MD  metoprolol tartrate (LOPRESSOR) 25 MG tablet Take 1 tablet (25 mg total) by mouth 2 (two) times daily. 05/16/13  Yes Shanker Kristeen Mans, MD  Multiple Vitamin (MULTIVITAMIN WITH MINERALS) TABS tablet Take 1 tablet by mouth daily.   Yes Historical Provider, MD  naproxen sodium (ANAPROX) 220 MG tablet  Take 220-440 mg by mouth as needed (pain).   Yes Historical Provider, MD  Rivaroxaban (XARELTO) 20 MG TABS Take 20 mg by mouth daily.   Yes Historical Provider, MD    Scheduled Meds: . cholecalciferol  1,000 Units Oral Daily  . digoxin  0.125 mg Oral Daily  . guaiFENesin  600 mg Oral BID  . levothyroxine  75 mcg Intravenous Daily  . metoprolol tartrate  25 mg Oral BID  . multivitamin with minerals  1 tablet Oral Daily  . simvastatin  20 mg Oral q1800   Continuous Infusions: . dextrose 5 % and 0.9 % NaCl with KCl 20 mEq/L 75 mL/hr at 06/23/13 1600   PRN Meds:.acetaminophen, acetaminophen, albuterol, albuterol, metoprolol, morphine injection, ondansetron (ZOFRAN) IV, ondansetron  Allergies as of 06/21/2013  . (No Known Allergies)    Family History  Problem Relation Age of Onset  . Other Father     varicose veins  . Hyperlipidemia Son   . Hypertension Son   . AAA (abdominal aortic aneurysm) Son   . Lung cancer Brother     History   Social History  . Marital Status: Widowed    Spouse Name: N/A    Number of Children: 3  . Years of Education: N/A   Occupational History  . retired Economist    Social History Main Topics  . Smoking status: Former Smoker -- 1.00 packs/day for 25 years    Types: Cigarettes    Quit date: 04/19/1966  . Smokeless tobacco: Never Used     Comment: 100 pack years per 08/2012 note  . Alcohol Use: 0.0 oz/week     Comment: glass of wine daily, scotch 3 or 4 times a week. Not in last 3 months  . Drug Use: No  . Sexual Activity: No   Other Topics Concern  . Not on file   Social History Narrative  . No narrative on file    Review of Systems: All negative except as stated above in HPI.  Physical Exam: Vital signs: Filed Vitals:   06/24/13 0500  BP: 135/74  Pulse: 80  Temp: 97.9 F (36.6 C)  Resp: 18   Last BM Date: 06/22/13 General:   Lethargic, elderly, frail HEENT: anicteric Neck: nontender  Lungs:  Coarse breath sounds  anteriorly  Heart:  Regular rate and rhythm Abdomen: soft, NT, ND, +BS  Rectal:  Deferred Ext: dry cracked skin, no edema  GI:  Lab Results:  Recent Labs  06/21/13 1640 06/24/13 0518  WBC 8.4 6.9  HGB 12.3* 11.1*  HCT 36.4* 32.9*  PLT 185 181   BMET  Recent Labs  06/21/13 1640 06/22/13 0440  NA 134* 138  K 4.1 3.8  CL 95* 99  CO2 26 27  GLUCOSE 107* 97  BUN 13 13  CREATININE 0.63 0.67  CALCIUM 10.0 9.9   LFT  Recent Labs  06/22/13 0440  PROT 6.2  ALBUMIN 2.6*  AST 16  ALT 17  ALKPHOS 136*  BILITOT 0.7   PT/INR  Recent Labs  06/22/13 0440  LABPROT 17.7*  INR 1.50*     Studies/Results: Dg Esophagus  06/23/2013   CLINICAL DATA:  Dysphagia. Blockage seen on modified barium swallow.  EXAM: ESOPHOGRAM/BARIUM SWALLOW  TECHNIQUE: Single contrast examination was performed using  thin barium.  COMPARISON:  Chest CT 06/21/2013  FLUOROSCOPY TIME:  1 min, 35 seconds  FINDINGS: Prior to the patient ingesting additional barium, fluoroscopy demonstrates retained food/debris as well as barium from a earlier modified barium swallow study. The patient was able to take several swallows of thin barium. This mixes with the foods/ debris in the obstructed proximal to mid esophagus, with irregular tapering just below the aortic arch compatible with near complete obstructing esophageal mass. A small amount of contrast passes through this area in into the distal esophagus and stomach.  IMPRESSION: Near complete obstruction of the mid esophagus with irregular tapering of the esophagus at this level concerning for esophageal mass/ neoplasm.   Electronically Signed   By: Rolm Baptise M.D.   On: 06/23/2013 17:25   Dg Swallowing Func-speech Pathology  06/23/2013   Macario Golds, CCC-SLP     06/23/2013  4:27 PM Objective Swallowing Evaluation: Modified Barium Swallowing Study   Patient Details  Name: AKASHDEEP CHUBA MRN: [id number] Date of Birth: Jun 19, 1926  Today's Date: 06/23/2013  Time: 8850-2774 SLP Time Calculation (min): 52 min  Past Medical History:  Past Medical History  Diagnosis Date  . Hypertension   . Hyperlipidemia   . AAA (abdominal aortic aneurysm)   . Arthritis   . CAD (coronary artery disease)   . Thyroid disease     hypothyroidism  . Dysrhythmia     atrial flutter  . Shortness of breath     a little with exertion  . Polio 1934  . Atrial flutter     chronic anticoagulation  . COPD (chronic obstructive pulmonary disease)   . History of nuclear stress test 04/2010    dipyridamole; small, mostly fixed basal to mid inferior and  inferoseptal defect (gut artifact v. scar); post stress EF 62%;  non-diagnostic for ischemia; low risk    Past Surgical History:  Past Surgical History  Procedure Laterality Date  . Abdominal aortic aneurysm repair  05/21/2010    aorto bi-iliac BPG  . Appendectomy  1949  . Popliteal synovial cyst excision Left   . Joint replacement      Knee and Hip replacements  . Total hip arthroplasty Left 1994  . Knee arthroplasty Left 2004  . Total hip revision Left 12/04/2012    Procedure: LEFT TOTAL HIP REVISION;  Surgeon: Mauri Pole,  MD;  Location: [facility];  Service: Orthopedics;  Laterality: Left;  . Cardiac catheterization  2009    in [place]; 50-60% RCA  . Transthoracic echocardiogram  04/2010    EF =>55%; RV borderline dilated; LA mildly dilated; mild mitral  annular calcif & mild MR; mild TR; mild calcif of AV leaflets  with mild AV stenosis; aortic root sclerosis/calcif  . Orif femur fracture Left 01/22/2013    Procedure: OPEN REDUCTION INTERNAL FIXATION (ORIF) DISTAL FEMUR  FRACTURE;  Surgeon: Mauri Pole, MD;  Location: [place];   Service: Orthopedics;  Laterality: Left;   HPI:  80 yo male adm to [facility] with hemopytsis.  Pt found to have  subcarinal mass with esophageal obstruction proximal and mid with  gas noted,  Chest imaging showed post obstructive atelectasis LLL,  ? RLL infection/ ATX.  Pt reports 43 pound weight loss since Oct  2014 and aphonia x 2 1/2 months  with gradual onset and no  worsening.  Pt also with choking on food more than drink per his  report.  RN reports pt coughing with medicine.  BSE ordered.      Assessment / Plan / Recommendation Clinical Impression  Dysphagia Diagnosis: Mild oral phase dysphagia;Moderate  pharyngeal phase dysphagia;Suspected primary esophageal  dysphagia;Moderate cervical esophageal phase dysphagia   Clinical impression: Mild oral and moderate pharyngeal-cervical  esophageal dysphagia with suspected primary esophageal deficits.   Pt has sensorimotor deficits resulting in weak muscle contraction  and pharyngeal stasis without pt awareness.  Laryngeal elevation  was very poor negatively impacting airway protection and UES  opening.   Chin tuck posture along with dry swallows decreased  amount of pharyngeal stasis of liquids.  Head turn right with  chin tuck not effective.  Laryngeal penetration (silent) of small  amount of barium noted after swallow without pt awareness-  presumed from pyriform residuals spilling into open larynx.    Pt coughed during MBS without barium visualized in larynx -  suspect possible secretion aspiration occuring based on secretion  stasis in pharynx.    SLP only tested liquids due to appearance of stasis and narrowing  in mid-esophagus that did NOT clear with multiple swallows or  liquids. Diffiuclt situation due to multifactorial dysphagia as  pt with at risk of aspiration due to oropharyngeal weakness and  suspected esophageal component (appearance of narrowing with  retained secretions above - ? from extrinsic compression from  mass.  Please note radiologist not present during MBS to confirm  findings.      Recommend medicine be given IV and pt be allowed SMALL amounts of  tsps of thin water (? clears) with chin tuck and dry swallows for  comfort.    Rec dc intake if pt coughing or senses stasis *he did not have  sensation to poor clearance during entire test.  Pt given oral  suction and SLP demonstrated  use to help clear secretions from  pharynx if able to cough.  As pt reports h/o dysphagia and  coughing up secretions x3 weeks, suspect acute on chronic  dysphagia.       Treatment Recommendation  Therapy as outlined in treatment plan below    Diet Recommendation  (? single tsps clears with chin tuck - SMALL  SINGLE TSPS ONLY)   Liquid Administration via: Spoon Medication Administration: Via alternative means Compensations: Multiple dry swallows after each bite/sip;Hard  cough after swallow Postural Changes and/or Swallow Maneuvers: Seated upright 90  degrees;Upright 30-60 min after meal;Chin tuck    Other  Recommendations Recommended Consults: MBS Oral Care Recommendations: Oral care Q4 per protocol   Follow Up Recommendations   ? Referral to address possible masses causing extrinsic  obstruction on esophagus   Frequency and Duration min 2x/week  2 weeks        General Date of Onset: 06/23/13 HPI: 80 yo male adm to [facility] with hemopytsis.  Pt found to have  subcarinal mass with esophageal obstruction proximal and mid with  gas noted, Chest imaging showed post obstructive atelectasis LLL,  ? RLL infection/ ATX.  Pt reports 43 pound weight loss since Oct  2014 and aphonia x 2 1/2 months with gradual onset and no  worsening.  Pt also with choking on food more than drink per  his  report.  RN reports pt coughing with medicine.  BSE ordered.  Type of Study: Modified Barium Swallowing Study Reason for Referral: Objectively evaluate swallowing function Diet Prior to this Study: NPO Temperature Spikes Noted: No Respiratory Status: Nasal cannula History of Recent Intubation: No Behavior/Cognition: Alert;Cooperative;Pleasant mood Oral Cavity - Dentition: Missing dentition (few teeth) Oral Motor / Sensory Function: Impaired - see Bedside swallow  eval Self-Feeding Abilities: Able to feed self Patient Positioning: Upright in chair Baseline Vocal Quality: Low vocal intensity;Breathy Volitional Cough: Weak Volitional Swallow: Able  to elicit Pharyngeal Secretions: Standing secretions in (comment) (pharynx)     Reason for Referral Objectively evaluate swallowing function   Oral Phase Oral Preparation/Oral Phase Oral Phase: Impaired Oral - Nectar Oral - Nectar Teaspoon: Reduced posterior  propulsion;Lingual/palatal residue;Weak lingual manipulation Oral - Nectar Straw: Reduced posterior propulsion;Lingual/palatal  residue;Weak lingual manipulation Oral - Thin Oral - Thin Teaspoon: Reduced posterior  propulsion;Lingual/palatal residue;Weak lingual manipulation Oral - Thin Cup: Reduced posterior propulsion;Lingual/palatal  residue;Weak lingual manipulation Oral - Thin Straw: Reduced posterior propulsion;Lingual/palatal  residue;Weak lingual manipulation   Pharyngeal Phase Pharyngeal Phase Pharyngeal Phase: Impaired Pharyngeal - Nectar Pharyngeal - Nectar Teaspoon: Reduced epiglottic  inversion;Reduced pharyngeal peristalsis;Reduced anterior  laryngeal mobility;Reduced laryngeal elevation;Pharyngeal residue  - pyriform sinuses;Pharyngeal residue - valleculae;Trace  aspiration;Pharyngeal residue - cp segment Pharyngeal - Nectar Cup: Reduced epiglottic inversion;Reduced  pharyngeal peristalsis;Reduced anterior laryngeal  mobility;Reduced laryngeal elevation;Pharyngeal residue -  valleculae;Pharyngeal residue - pyriform sinuses;Reduced tongue  base retraction;Pharyngeal residue - cp  segment;Penetration/Aspiration after swallow Penetration/Aspiration details (nectar cup): Material enters  airway, remains ABOVE vocal cords and not ejected out Pharyngeal - Thin Pharyngeal - Thin Teaspoon: Reduced epiglottic inversion;Reduced  pharyngeal peristalsis;Reduced anterior laryngeal  mobility;Reduced laryngeal elevation;Pharyngeal residue -  valleculae;Pharyngeal residue - pyriform sinuses;Reduced tongue  base retraction;Pharyngeal residue - cp segment Pharyngeal - Thin Cup: Reduced epiglottic inversion;Reduced  pharyngeal peristalsis;Reduced anterior laryngeal   mobility;Reduced laryngeal elevation;Pharyngeal residue -  valleculae;Pharyngeal residue - pyriform sinuses;Reduced tongue  base retraction;Pharyngeal residue - cp segment Pharyngeal - Thin Straw: Reduced epiglottic inversion;Reduced  pharyngeal peristalsis;Reduced anterior laryngeal  mobility;Reduced laryngeal elevation;Pharyngeal residue -  valleculae;Pharyngeal residue - pyriform sinuses;Reduced tongue  base retraction;Pharyngeal residue - cp segment Pharyngeal Phase - Comment Pharyngeal Comment: chin tuck with liquids decreases amount of  pharyngeal accumulation, cued dry swallows - pt able to cough and  "hock" up mild amount of stasis x1, does not sense pharygneal  stasis  Cervical Esophageal Phase    GO    Cervical Esophageal Phase Cervical Esophageal Phase: Impaired Cervical Esophageal Phase - Nectar Nectar Teaspoon: Reduced cricopharyngeal relaxation Nectar Cup: Reduced cricopharyngeal relaxation Cervical Esophageal Phase - Thin Thin Teaspoon: Reduced cricopharyngeal relaxation Thin Cup: Reduced cricopharyngeal relaxation Thin Straw: Reduced cricopharyngeal relaxation         Luanna Salk, MS [facility] SLP [phone]     Impression/Plan: 80 yo with a left hilar mass and esophageal mass causing solid food dysphagia, hemoptysis, weight loss and cough. Presentation concerning for lung cancer vs esophageal cancer. Patient scheduled for a bronchoscopy tomorrow and pending those findings will likely need an EGD to further evaluate. Dr. Paulita Fujita will reevaluate this week following the bronchoscopy. Palliative care has seen pt. Strict NPO. Aspiration precautions.    LOS: 3 days   Coggon C.  06/24/2013, 12:58 PM

## 2013-06-24 NOTE — Progress Notes (Signed)
RN notified Nunzio Cory (daughter) @ [phone] that she would notified in the am as to what time the bronchoscopy   will be scheduled on Monday 06/25/13.

## 2013-06-24 NOTE — Progress Notes (Addendum)
Patient RF:[name]      DOB: 1926-12-12      KZL:[id number]  Met with Son and Daughter and patient:  Austin [name] is at peace with the fact that he must leave this earth at some time .  He is trying to understand the timing related to his illness.  His children asked excellent questions about chemo and radiation all of which specifically would need a tissue diagnosis to answer as precisely as possible.  We did speak in generalities about the fact that with or without treatment he likely has months, not years prognosis and that he has the right to have as much or as little information about his condition to make decisions for himself.  We clarified that if he goes through the FOB and has complications requiring a breathing tube that we would attempt to liberate him as quickly as possible and that if he could not sustain liberation we would honor his wishes not to prolong his life on a vent.  He is ok with allowing his family to gather from [place] before [facility] extubation if things were to be that complicated but he would want to be liberated and understands that he would die.  We talked about feeding tubes he is leaning away from artificial support as he states ,"I don't want to just be lying in the bed".  We talked about using hospice services and family will need further assistance planning if palliative radiation is involved as he has not been able to mobilize himself easily s/p femur fracture and repair.   Recommend:  1. DNR  2.  Proceed with FOB unless patient changes his mind between now and then.  3.  Will  Attempt to get a different bed for the patient as it does not appear to be maximally functional and he is uncomfortable 4.  Consider wound care consult for lesions on his back ( ? Sweets syndrome?, vs boil  Was scheduled to see general surgeon for removal) 5. Barrier cream for rash on buttocks looks like contact dermatitis.   Time:  [phone] am

## 2013-06-24 NOTE — Progress Notes (Signed)
[facility] PROGRESS NOTE  Austin [name] KDT:[id number] DOB: [birth year] DOA: 06/21/2013 PCP: Austin Sake, MD  Assessment/Plan: Active Problems:  Lung mass Hemoptysis  Chest mass  Dysphagia  Atrial fibrillation  Hyperlipemia  Hypertension  COPD  80 y/o male with PMH of HTN, COPD, HPL, A fib, CVA, recent sepsis, recurrent pneumonia, c diff presented with hemoptysis found to have lung mass   1. Hemoptysis Lung mass likely lung CA (+tobacco use); / LUNG CA with mets to esophagus  - CT: abnormal mediastinal and left hilar mass producing  mass effect upon the left main pulmonary artery mass is also resulting in esophageal obstruction with the proximal and mid portions of the esophagus The appearance is consistent with a central lung malignancy or malignancy of the distal esophagus. - pulmonary: plan for bronchoscopy and biopsy monday; hold Xarelto - prognosis guarded; palliative care consulted   2. Dysphagia due esophageal mass lesion: failed swallow evaluation; at risk for aspiration, recent h/p aspiration PNA - esophagogram: Near complete obstruction of the mid esophagus with irregular  tapering of the esophagus atthis level concerning for esophageal mass/ neoplasm. - NPO, IVF, consulted GI eval/EGD/biopsy - d/w patient, famiyl about PEG vs comfort feeding; they will think  About it   3. Atrial fibrillation: rate Controlled, continue with metoprolol and digoxin, hold anticoagulation it due to to #1   4. Hypertension: Acceptable continue with metoprolol   5. Hyperlipedemia: cont with statin   6. COPD; h/o tobacco use; no wheezing on exam; cont bronchodilators as needed   7. History of CVA holding Xarelto due to hemoptysis; continue statin  -no new symptoms;   8. History of recent left hip repair  - per notes: Dr. Alvan [name] on 1/28-okay for 50% weightbearing on the left lower extremity. Patient to followup with Dr. Alvan [name] in in one month for repeat x-rays, if continues to do  well with good healing, patient will likely be advanced to more weightbearing status -xray: 1/28: Healing fracture involving the distal shaft of the left femur status post sideplate and screw device reduction  Prognosis is guarded, family is interested in learning about hospice care  DVT Prophylaxis SCDs    Code Status: DNR Family Communication: d/w patient, updated daughter Austin, [name] Daughter [phone]  (indicate person spoken with, relationship, and if by phone, the number) Disposition Plan: pend clinical improvement    Consultants:  Pulmonology   Procedures:  None   Antibiotics:  None  (indicate start date, and stop date if known)  HPI/Subjective: alert  Objective: Filed Vitals:   06/24/13 0500  BP: 135/74  Pulse: 80  Temp: 97.9 F (36.6 C)  Resp: 18    Intake/Output Summary (Last 24 hours) at 06/24/13 1200 Last data filed at 06/24/13 0648  Gross per 24 hour  Intake    885 ml  Output    775 ml  Net    110 ml   Filed Weights   06/21/13 2240  Weight: 111.3 kg (245 lb 6 oz)    Exam:   General:  alert  Cardiovascular: s1,s2 rrr  Respiratory: LL poor AE  Abdomen: soft, nt, nd   Musculoskeletal: no edema   Data Reviewed: Basic Metabolic Panel:  Recent Labs Lab 06/21/13 1640 06/22/13 0440  NA 134* 138  K 4.1 3.8  CL 95* 99  CO2 26 27  GLUCOSE 107* 97  BUN 13 13  CREATININE 0.63 0.67  CALCIUM 10.0 9.9   Liver Function Tests:  Recent Labs Lab 06/22/13 0440  AST 16  ALT 17  ALKPHOS 136*  BILITOT 0.7  PROT 6.2  ALBUMIN 2.6*   No results found for this basename: LIPASE, AMYLASE,  in the last 168 hours No results found for this basename: AMMONIA,  in the last 168 hours CBC:  Recent Labs Lab 06/21/13 1640 06/24/13 0518  WBC 8.4 6.9  NEUTROABS 5.5  --   HGB 12.3* 11.1*  HCT 36.4* 32.9*  MCV 89.0 89.4  PLT 185 181   Cardiac Enzymes: No results found for this basename: CKTOTAL, CKMB, CKMBINDEX,  TROPONINI,  in the last 168 hours BNP (last 3 results)  Recent Labs  05/03/13 1319  PROBNP 622.4*   CBG: No results found for this basename: GLUCAP,  in the last 168 hours  No results found for this or any previous visit (from the past 240 hour(s)).   Studies: Dg Esophagus  06/23/2013   CLINICAL DATA:  Dysphagia. Blockage seen on modified barium swallow.  EXAM: ESOPHOGRAM/BARIUM SWALLOW  TECHNIQUE: Single contrast examination was performed using  thin barium.  COMPARISON:  Chest CT 06/21/2013  FLUOROSCOPY TIME:  1 min, 35 seconds  FINDINGS: Prior to the patient ingesting additional barium, fluoroscopy demonstrates retained food/debris as well as barium from a earlier modified barium swallow study. The patient was able to take several swallows of thin barium. This mixes with the foods/ debris in the obstructed proximal to mid esophagus, with irregular tapering just below the aortic arch compatible with near complete obstructing esophageal mass. A small amount of contrast passes through this area in into the distal esophagus and stomach.  IMPRESSION: Near complete obstruction of the mid esophagus with irregular tapering of the esophagus at this level concerning for esophageal mass/ neoplasm.   Electronically Signed   By: Austin [name] M.D.   On: 06/23/2013 17:25   Dg Swallowing Func-speech Pathology  06/23/2013   Austin [name], CCC-SLP     06/23/2013  4:27 PM Objective Swallowing Evaluation: Modified Barium Swallowing Study   Patient Details  Name: Austin [name] MRN: [id number] Date of Birth: [birth year]  Today's Date: 06/23/2013 Time: 2878-6767 SLP Time Calculation (min): 52 min  Past Medical History:  Past Medical History  Diagnosis Date  . Hypertension   . Hyperlipidemia   . AAA (abdominal aortic aneurysm)   . Arthritis   . CAD (coronary artery disease)   . Thyroid disease     hypothyroidism  . Dysrhythmia     atrial flutter  . Shortness of breath     a little with exertion  . Polio 1934  .  Atrial flutter     chronic anticoagulation  . COPD (chronic obstructive pulmonary disease)   . History of nuclear stress test 04/2010    dipyridamole; small, mostly fixed basal to mid inferior and  inferoseptal defect (gut artifact v. scar); post stress EF 62%;  non-diagnostic for ischemia; low risk    Past Surgical History:  Past Surgical History  Procedure Laterality Date  . Abdominal aortic aneurysm repair  05/21/2010    aorto bi-iliac [facility]  . Appendectomy  1949  . Popliteal synovial cyst excision Left   . Joint replacement      Knee and Hip replacements  . Total hip arthroplasty Left 1994  . Knee arthroplasty Left 2004  . Total hip revision Left 12/04/2012    Procedure: LEFT TOTAL HIP REVISION;  Surgeon: Mauri Pole,  MD;  Location: [facility];  Service: Orthopedics;  Laterality: Left;  . Cardiac catheterization  2009    in [place]; 50-60% RCA  . Transthoracic echocardiogram  04/2010    EF =>55%; RV borderline dilated; LA mildly dilated; mild mitral  annular calcif & mild MR; mild TR; mild calcif of AV leaflets  with mild AV stenosis; aortic root sclerosis/calcif  . Orif femur fracture Left 01/22/2013    Procedure: OPEN REDUCTION INTERNAL FIXATION (ORIF) DISTAL FEMUR  FRACTURE;  Surgeon: Mauri Pole, MD;  Location: [facility];   Service: Orthopedics;  Laterality: Left;   HPI:  80 yo male adm to [facility] with hemopytsis.  Pt found to have  subcarinal mass with esophageal obstruction proximal and mid with  gas noted, Chest imaging showed post obstructive atelectasis LLL,  ? RLL infection/ ATX.  Pt reports 43 pound weight loss since Oct  2014 and aphonia x 2 1/2 months with gradual onset and no  worsening.  Pt also with choking on food more than drink per his  report.  RN reports pt coughing with medicine.  BSE ordered.      Assessment / Plan / Recommendation Clinical Impression  Dysphagia Diagnosis: Mild oral phase dysphagia;Moderate  pharyngeal phase dysphagia;Suspected primary esophageal  dysphagia;Moderate cervical esophageal  phase dysphagia   Clinical impression: Mild oral and moderate pharyngeal-cervical  esophageal dysphagia with suspected primary esophageal deficits.   Pt has sensorimotor deficits resulting in weak muscle contraction  and pharyngeal stasis without pt awareness.  Laryngeal elevation  was very poor negatively impacting airway protection and UES  opening.   Chin tuck posture along with dry swallows decreased  amount of pharyngeal stasis of liquids.  Head turn right with  chin tuck not effective.  Laryngeal penetration (silent) of small  amount of barium noted after swallow without pt awareness-  presumed from pyriform residuals spilling into open larynx.    Pt coughed during MBS without barium visualized in larynx -  suspect possible secretion aspiration occuring based on secretion  stasis in pharynx.    SLP only tested liquids due to appearance of stasis and narrowing  in mid-esophagus that did NOT clear with multiple swallows or  liquids. Diffiuclt situation due to multifactorial dysphagia as  pt with at risk of aspiration due to oropharyngeal weakness and  suspected esophageal component (appearance of narrowing with  retained secretions above - ? from extrinsic compression from  mass.  Please note radiologist not present during MBS to confirm  findings.      Recommend medicine be given IV and pt be allowed SMALL amounts of  tsps of thin water (? clears) with chin tuck and dry swallows for  comfort.    Rec dc intake if pt coughing or senses stasis *he did not have  sensation to poor clearance during entire test.  Pt given oral  suction and SLP demonstrated use to help clear secretions from  pharynx if able to cough.  As pt reports h/o dysphagia and  coughing up secretions x3 weeks, suspect acute on chronic  dysphagia.       Treatment Recommendation  Therapy as outlined in treatment plan below    Diet Recommendation  (? single tsps clears with chin tuck - SMALL  SINGLE TSPS ONLY)   Liquid Administration via: Spoon  Medication Administration: Via alternative means Compensations: Multiple dry swallows after each bite/sip;Hard  cough after swallow Postural Changes and/or Swallow Maneuvers: Seated upright 90  degrees;Upright 30-60 min after meal;Chin tuck    Other  Recommendations Recommended Consults: MBS Oral Care Recommendations: Oral care Q4 per protocol  Follow Up Recommendations   ? Referral to address possible masses causing extrinsic  obstruction on esophagus   Frequency and Duration min 2x/week  2 weeks        General Date of Onset: 06/23/13 HPI: 80 yo male adm to [facility] with hemopytsis.  Pt found to have  subcarinal mass with esophageal obstruction proximal and mid with  gas noted, Chest imaging showed post obstructive atelectasis LLL,  ? RLL infection/ ATX.  Pt reports 43 pound weight loss since Oct  2014 and aphonia x 2 1/2 months with gradual onset and no  worsening.  Pt also with choking on food more than drink per his  report.  RN reports pt coughing with medicine.  BSE ordered.  Type of Study: Modified Barium Swallowing Study Reason for Referral: Objectively evaluate swallowing function Diet Prior to this Study: NPO Temperature Spikes Noted: No Respiratory Status: Nasal cannula History of Recent Intubation: No Behavior/Cognition: Alert;Cooperative;Pleasant mood Oral Cavity - Dentition: Missing dentition (few teeth) Oral Motor / Sensory Function: Impaired - see Bedside swallow  eval Self-Feeding Abilities: Able to feed self Patient Positioning: Upright in chair Baseline Vocal Quality: Low vocal intensity;Breathy Volitional Cough: Weak Volitional Swallow: Able to elicit Pharyngeal Secretions: Standing secretions in (comment) (pharynx)     Reason for Referral Objectively evaluate swallowing function   Oral Phase Oral Preparation/Oral Phase Oral Phase: Impaired Oral - Nectar Oral - Nectar Teaspoon: Reduced posterior  propulsion;Lingual/palatal residue;Weak lingual manipulation Oral - Nectar Straw: Reduced posterior  propulsion;Lingual/palatal  residue;Weak lingual manipulation Oral - Thin Oral - Thin Teaspoon: Reduced posterior  propulsion;Lingual/palatal residue;Weak lingual manipulation Oral - Thin Cup: Reduced posterior propulsion;Lingual/palatal  residue;Weak lingual manipulation Oral - Thin Straw: Reduced posterior propulsion;Lingual/palatal  residue;Weak lingual manipulation   Pharyngeal Phase Pharyngeal Phase Pharyngeal Phase: Impaired Pharyngeal - Nectar Pharyngeal - Nectar Teaspoon: Reduced epiglottic  inversion;Reduced pharyngeal peristalsis;Reduced anterior  laryngeal mobility;Reduced laryngeal elevation;Pharyngeal residue  - pyriform sinuses;Pharyngeal residue - valleculae;Trace  aspiration;Pharyngeal residue - cp segment Pharyngeal - Nectar Cup: Reduced epiglottic inversion;Reduced  pharyngeal peristalsis;Reduced anterior laryngeal  mobility;Reduced laryngeal elevation;Pharyngeal residue -  valleculae;Pharyngeal residue - pyriform sinuses;Reduced tongue  base retraction;Pharyngeal residue - cp  segment;Penetration/Aspiration after swallow Penetration/Aspiration details (nectar cup): Material enters  airway, remains ABOVE vocal cords and not ejected out Pharyngeal - Thin Pharyngeal - Thin Teaspoon: Reduced epiglottic inversion;Reduced  pharyngeal peristalsis;Reduced anterior laryngeal  mobility;Reduced laryngeal elevation;Pharyngeal residue -  valleculae;Pharyngeal residue - pyriform sinuses;Reduced tongue  base retraction;Pharyngeal residue - cp segment Pharyngeal - Thin Cup: Reduced epiglottic inversion;Reduced  pharyngeal peristalsis;Reduced anterior laryngeal  mobility;Reduced laryngeal elevation;Pharyngeal residue -  valleculae;Pharyngeal residue - pyriform sinuses;Reduced tongue  base retraction;Pharyngeal residue - cp segment Pharyngeal - Thin Straw: Reduced epiglottic inversion;Reduced  pharyngeal peristalsis;Reduced anterior laryngeal  mobility;Reduced laryngeal elevation;Pharyngeal residue -   valleculae;Pharyngeal residue - pyriform sinuses;Reduced tongue  base retraction;Pharyngeal residue - cp segment Pharyngeal Phase - Comment Pharyngeal Comment: chin tuck with liquids decreases amount of  pharyngeal accumulation, cued dry swallows - pt able to cough and  "hock" up mild amount of stasis x1, does not sense pharygneal  stasis  Cervical Esophageal Phase    GO    Cervical Esophageal Phase Cervical Esophageal Phase: Impaired Cervical Esophageal Phase - Nectar Nectar Teaspoon: Reduced cricopharyngeal relaxation Nectar Cup: Reduced cricopharyngeal relaxation Cervical Esophageal Phase - Thin Thin Teaspoon: Reduced cricopharyngeal relaxation Thin Cup: Reduced cricopharyngeal relaxation Thin Straw: Reduced cricopharyngeal relaxation         Luanna Salk, [facility] SLP [phone]  Scheduled Meds: . cholecalciferol  1,000 Units Oral Daily  . digoxin  0.125 mg Oral Daily  . guaiFENesin  600 mg Oral BID  . levothyroxine  75 mcg Intravenous Daily  . metoprolol tartrate  25 mg Oral BID  . multivitamin with minerals  1 tablet Oral Daily  . simvastatin  20 mg Oral q1800   Continuous Infusions: . dextrose 5 % and 0.9 % NaCl with KCl 20 mEq/L 75 mL/hr at 06/23/13 1600    Active Problems:   Hemoptysis   Chest mass   Dysphagia   Atrial fibrillation   Hyperlipemia   Hypertension   Malnutrition of moderate degree    Time spent: >35 minutes    Kinnie Feil  [facility] Pager [phone]. If 7PM-7AM, please contact night-coverage at [email or web address], password [facility] 06/24/2013, 12:00 PM  LOS: 3 days

## 2013-06-25 ENCOUNTER — Encounter (HOSPITAL_COMMUNITY): Payer: Self-pay | Admitting: General Surgery

## 2013-06-25 ENCOUNTER — Inpatient Hospital Stay (HOSPITAL_COMMUNITY): Payer: Medicare Other

## 2013-06-25 ENCOUNTER — Encounter (HOSPITAL_COMMUNITY)
Admission: EM | Disposition: A | Discharge status: Hospice | Payer: Self-pay | Source: Home / Self Care | Attending: Internal Medicine

## 2013-06-25 DIAGNOSIS — D492 Neoplasm of unspecified behavior of bone, soft tissue, and skin: Secondary | ICD-10-CM

## 2013-06-25 HISTORY — PX: VIDEO BRONCHOSCOPY: SHX5072

## 2013-06-25 SURGERY — VIDEO BRONCHOSCOPY WITHOUT FLUORO
Anesthesia: Moderate Sedation | Laterality: Bilateral

## 2013-06-25 MED ORDER — MIDAZOLAM HCL 10 MG/2ML IJ SOLN
INTRAMUSCULAR | Status: DC | PRN
  Administered 2013-06-25 (×2): 1 mg via INTRAVENOUS

## 2013-06-25 MED ORDER — LIDOCAINE HCL 1 % IJ SOLN
INTRAMUSCULAR | Status: DC | PRN
  Administered 2013-06-25: 6 mL via RESPIRATORY_TRACT

## 2013-06-25 MED ORDER — PHENYLEPHRINE HCL 0.25 % NA SOLN
NASAL | Status: DC | PRN
  Administered 2013-06-25: 1 via NASAL

## 2013-06-25 MED ORDER — MIDAZOLAM HCL 10 MG/2ML IJ SOLN
INTRAMUSCULAR | Status: AC
  Filled 2013-06-25: qty 4

## 2013-06-25 MED ORDER — METOPROLOL TARTRATE 1 MG/ML IV SOLN
5.0000 mg | Freq: Once | INTRAVENOUS | Status: AC
  Administered 2013-06-25: 5 mg via INTRAVENOUS
  Filled 2013-06-25 (×2): qty 5

## 2013-06-25 MED ORDER — FENTANYL CITRATE 0.05 MG/ML IJ SOLN
INTRAMUSCULAR | Status: DC | PRN
  Administered 2013-06-25: 50 ug via INTRAVENOUS
  Administered 2013-06-25 (×2): 25 ug via INTRAVENOUS

## 2013-06-25 MED ORDER — LIDOCAINE HCL 2 % EX GEL
CUTANEOUS | Status: DC | PRN
  Administered 2013-06-25: 1

## 2013-06-25 MED ORDER — SODIUM CHLORIDE 0.9 % IV SOLN
INTRAVENOUS | Status: DC
  Administered 2013-06-25: 16:00:00 via INTRAVENOUS

## 2013-06-25 MED ORDER — FENTANYL CITRATE 0.05 MG/ML IJ SOLN
INTRAMUSCULAR | Status: AC
  Filled 2013-06-25: qty 4

## 2013-06-25 MED ORDER — METOPROLOL TARTRATE 1 MG/ML IV SOLN
5.0000 mg | Freq: Once | INTRAVENOUS | Status: AC
  Administered 2013-06-26: 5 mg via INTRAVENOUS
  Filled 2013-06-25 (×2): qty 5

## 2013-06-25 NOTE — Progress Notes (Signed)
Patient [name]      DOB: Dec 16, 1926      BWI:[id number]  Patient sleeping soundly and comfortably s/p bronch this afternoon.  Left message for his daughter will follow up in the am. Noted mild hemoptysis post procedure.  Analyssa Downs L. Lovena Le, MD MBA The Palliative Medicine Team at [facility] Phone: [phone] Pager: [phone]

## 2013-06-25 NOTE — Progress Notes (Signed)
  Name: Austin [name] MRN: [id number] DOB: [birth year]    ADMISSION DATE:  06/21/2013 CONSULTATION DATE:  06/21/2013  REFERRING MD :  Dr Daleen Bo PRIMARY SERVICE:  [facility]  CHIEF COMPLAINT:  Cough  BRIEF PATIENT DESCRIPTION: 80 year old male with multiple medical problems presents 40 for progressive cough/hemoptysis. CTA chest discovered subcarinal mass. PCCM asked to see.  SIGNIFICANT EVENTS / STUDIES:  3/5 CTA Chest - subcarinal mass 6.4 cmx 3.2 cm AP x 3.6 cm. left axillary region borderline enlarged lymph node measuring 1.4 cm in diameter.  LINES / TUBES: PIV  CULTURES: None   ANTIBIOTICS: None   SUBJECTIVE: No events overnight, no new complaints.  VITAL SIGNS: Temp:  [98 F (36.7 C)-98.6 F (37 C)] 98 F (36.7 C) (03/09 1407) Pulse Rate:  [74-104] 104 (03/09 1407) Resp:  [18] 18 (03/09 1407) BP: (122-147)/(72-92) 144/92 mmHg (03/09 1407) SpO2:  [96 %-98 %] 97 % (03/09 1407)  PHYSICAL EXAMINATION: General:  Elderly male in NAD Neuro:  Alert, oriented, answers questions appropriately. Lethargic. HEENT:  [place]/AT Neck:  Supple no JVD Cardiovascular:  RRR Lungs:  Bibasilar rhonchi, L > R Abdomen:  Soft, non-tender Musculoskeletal:  Intact, no acute deformity.  Skin:  Intact  Recent Labs Lab 06/21/13 1640 06/22/13 0440  NA 134* 138  K 4.1 3.8  CL 95* 99  CO2 26 27  BUN 13 13  CREATININE 0.63 0.67  GLUCOSE 107* 97    Recent Labs Lab 06/21/13 1640 06/24/13 0518  HGB 12.3* 11.1*  HCT 36.4* 32.9*  WBC 8.4 6.9  PLT 185 181   Dg Esophagus  06/23/2013   CLINICAL DATA:  Dysphagia. Blockage seen on modified barium swallow.  EXAM: ESOPHOGRAM/BARIUM SWALLOW  TECHNIQUE: Single contrast examination was performed using  thin barium.  COMPARISON:  Chest CT 06/21/2013  FLUOROSCOPY TIME:  1 min, 35 seconds  FINDINGS: Prior to the patient ingesting additional barium, fluoroscopy demonstrates retained food/debris as well as barium from a earlier modified barium swallow  study. The patient was able to take several swallows of thin barium. This mixes with the foods/ debris in the obstructed proximal to mid esophagus, with irregular tapering just below the aortic arch compatible with near complete obstructing esophageal mass. A small amount of contrast passes through this area in into the distal esophagus and stomach.  IMPRESSION: Near complete obstruction of the mid esophagus with irregular tapering of the esophagus at this level concerning for esophageal mass/ neoplasm.   Electronically Signed   By: Rolm Baptise M.D.   On: 06/23/2013 17:25   ASSESSMENT / PLAN:  Subcarinal Lung Mass 6.4 cm  x 3.2 cm AP x 3.6 cm  Hemoptysis  - Xeralto held. - Bronch today with wang needle biopsy. - I informed the patient at length that the procedure comes with its own set of complications given location of mass and proximity of blood vessels.  Informed that there is a chance he ends up on the ventilator post procedure.  He expressed understanding of that and is ok with short term intubation only. - Will proceed with bronch and send biopsy to path.   Rush Farmer, M.D. [facility] Pulmonary/Critical Care Medicine. Pager: [phone]. After hours pager: [phone].

## 2013-06-25 NOTE — Progress Notes (Signed)
Video Bronchoscopy done  Intervention bronchial washing done Intervention bronchial brushing done Intervention needle biopsy done x2 sites Procedure tolerated well

## 2013-06-25 NOTE — Progress Notes (Signed)
Patient requesting to eat and drink.  Spoke with Dr. Daleen Bo stated pt was not able to eat /drink today. willl cont to monitor.

## 2013-06-25 NOTE — Consult Note (Signed)
Reason for Consult:bleeding mass Referring Physician: Dr. Rowe Clack  Austin [name] is an 80 y.o. male.  HPI: we have been asked to see the patient in consultation for a bleeding lower back mass.  The patient is a poor historian, but believes it has been there fore 2-3 weeks now.  Associated with pruritis.  Denies prior bleeding or pain.  He is not sure whether it has increased in size.  He has a history of COPD, tobacco use, lung mass, esophageal lesion, CVA, hx Xarelto(on hold).  He is having a bronchoscopy today.    Past Medical History  Diagnosis Date  . Hypertension   . Hyperlipidemia   . AAA (abdominal aortic aneurysm)   . Arthritis   . CAD (coronary artery disease)   . Thyroid disease     hypothyroidism  . Dysrhythmia     atrial flutter  . Shortness of breath     a little with exertion  . Polio 1934  . Atrial flutter     chronic anticoagulation  . COPD (chronic obstructive pulmonary disease)   . History of nuclear stress test 04/2010    dipyridamole; small, mostly fixed basal to mid inferior and inferoseptal defect (gut artifact v. scar); post stress EF 62%; non-diagnostic for ischemia; low risk     Past Surgical History  Procedure Laterality Date  . Abdominal aortic aneurysm repair  05/21/2010    aorto bi-iliac BPG  . Appendectomy  1949  . Popliteal synovial cyst excision Left   . Joint replacement      Knee and Hip replacements  . Total hip arthroplasty Left 1994  . Knee arthroplasty Left 2004  . Total hip revision Left 12/04/2012    Procedure: LEFT TOTAL HIP REVISION;  Surgeon: Mauri Pole, MD;  Location: [facility];  Service: Orthopedics;  Laterality: Left;  . Cardiac catheterization  2009    in [place]; 50-60% RCA  . Transthoracic echocardiogram  04/2010    EF =>55%; RV borderline dilated; LA mildly dilated; mild mitral annular calcif & mild MR; mild TR; mild calcif of AV leaflets with mild AV stenosis; aortic root sclerosis/calcif  . Orif femur fracture Left  01/22/2013    Procedure: OPEN REDUCTION INTERNAL FIXATION (ORIF) DISTAL FEMUR FRACTURE;  Surgeon: Mauri Pole, MD;  Location: [place];  Service: Orthopedics;  Laterality: Left;    Family History  Problem Relation Age of Onset  . Other Father     varicose veins  . Hyperlipidemia Son   . Hypertension Son   . AAA (abdominal aortic aneurysm) Son   . Lung cancer Brother     Social History:  reports that he quit smoking about 47 years ago. His smoking use included Cigarettes. He has a 25 pack-year smoking history. He has never used smokeless tobacco. He reports that he drinks alcohol. He reports that he does not use illicit drugs.  Allergies: No Known Allergies  Medications:  Scheduled Meds: . cholecalciferol  1,000 Units Oral Daily  . digoxin  0.125 mg Oral Daily  . guaiFENesin  600 mg Oral BID  . levothyroxine  75 mcg Intravenous Daily  . metoprolol tartrate  25 mg Oral BID  . multivitamin with minerals  1 tablet Oral Daily  . simvastatin  20 mg Oral q1800   Continuous Infusions: . dextrose 5 % and 0.9 % NaCl with KCl 20 mEq/L 75 mL/hr at 06/25/13 1129   PRN Meds:.acetaminophen, acetaminophen, albuterol, albuterol, metoprolol, morphine injection, ondansetron (ZOFRAN) IV, ondansetron  Results for orders placed during the hospital encounter of 06/21/13 (from the past 48 hour(s))  CBC     Status: Abnormal   Collection Time    06/24/13  5:18 AM      Result Value Ref Range   WBC 6.9  4.0 - 10.5 K/uL   RBC 3.68 (*) 4.22 - 5.81 MIL/uL   Hemoglobin 11.1 (*) 13.0 - 17.0 g/dL   HCT 32.9 (*) 39.0 - 52.0 %   MCV 89.4  78.0 - 100.0 fL   MCH 30.2  26.0 - 34.0 pg   MCHC 33.7  30.0 - 36.0 g/dL   RDW 16.9 (*) 11.5 - 15.5 %   Platelets 181  150 - 400 K/uL    Dg Esophagus  06/23/2013   CLINICAL DATA:  Dysphagia. Blockage seen on modified barium swallow.  EXAM: ESOPHOGRAM/BARIUM SWALLOW  TECHNIQUE: Single contrast examination was performed using  thin barium.  COMPARISON:  Chest CT  06/21/2013  FLUOROSCOPY TIME:  1 min, 35 seconds  FINDINGS: Prior to the patient ingesting additional barium, fluoroscopy demonstrates retained food/debris as well as barium from a earlier modified barium swallow study. The patient was able to take several swallows of thin barium. This mixes with the foods/ debris in the obstructed proximal to mid esophagus, with irregular tapering just below the aortic arch compatible with near complete obstructing esophageal mass. A small amount of contrast passes through this area in into the distal esophagus and stomach.  IMPRESSION: Near complete obstruction of the mid esophagus with irregular tapering of the esophagus at this level concerning for esophageal mass/ neoplasm.   Electronically Signed   By: Rolm Baptise M.D.   On: 06/23/2013 17:25   Dg Swallowing Func-speech Pathology  06/23/2013   Macario Golds, CCC-SLP     06/23/2013  4:27 PM Objective Swallowing Evaluation: Modified Barium Swallowing Study   Patient Details  Name: Austin [name] MRN: [id number] Date of Birth: 1926-09-19  Today's Date: 06/23/2013 Time: 4196-2229 SLP Time Calculation (min): 52 min  Past Medical History:  Past Medical History  Diagnosis Date  . Hypertension   . Hyperlipidemia   . AAA (abdominal aortic aneurysm)   . Arthritis   . CAD (coronary artery disease)   . Thyroid disease     hypothyroidism  . Dysrhythmia     atrial flutter  . Shortness of breath     a little with exertion  . Polio 1934  . Atrial flutter     chronic anticoagulation  . COPD (chronic obstructive pulmonary disease)   . History of nuclear stress test 04/2010    dipyridamole; small, mostly fixed basal to mid inferior and  inferoseptal defect (gut artifact v. scar); post stress EF 62%;  non-diagnostic for ischemia; low risk    Past Surgical History:  Past Surgical History  Procedure Laterality Date  . Abdominal aortic aneurysm repair  05/21/2010    aorto bi-iliac BPG  . Appendectomy  1949  . Popliteal synovial cyst excision  Left   . Joint replacement      Knee and Hip replacements  . Total hip arthroplasty Left 1994  . Knee arthroplasty Left 2004  . Total hip revision Left 12/04/2012    Procedure: LEFT TOTAL HIP REVISION;  Surgeon: Mauri Pole,  MD;  Location: [facility];  Service: Orthopedics;  Laterality: Left;  . Cardiac catheterization  2009    in [place]; 50-60% RCA  . Transthoracic echocardiogram  04/2010    EF =>55%; RV borderline  dilated; LA mildly dilated; mild mitral  annular calcif & mild MR; mild TR; mild calcif of AV leaflets  with mild AV stenosis; aortic root sclerosis/calcif  . Orif femur fracture Left 01/22/2013    Procedure: OPEN REDUCTION INTERNAL FIXATION (ORIF) DISTAL FEMUR  FRACTURE;  Surgeon: Mauri Pole, MD;  Location: [place];   Service: Orthopedics;  Laterality: Left;   HPI:  80 yo male adm to [facility] with hemopytsis.  Pt found to have  subcarinal mass with esophageal obstruction proximal and mid with  gas noted, Chest imaging showed post obstructive atelectasis LLL,  ? RLL infection/ ATX.  Pt reports 43 pound weight loss since Oct  2014 and aphonia x 2 1/2 months with gradual onset and no  worsening.  Pt also with choking on food more than drink per his  report.  RN reports pt coughing with medicine.  BSE ordered.      Assessment / Plan / Recommendation Clinical Impression  Dysphagia Diagnosis: Mild oral phase dysphagia;Moderate  pharyngeal phase dysphagia;Suspected primary esophageal  dysphagia;Moderate cervical esophageal phase dysphagia   Clinical impression: Mild oral and moderate pharyngeal-cervical  esophageal dysphagia with suspected primary esophageal deficits.   Pt has sensorimotor deficits resulting in weak muscle contraction  and pharyngeal stasis without pt awareness.  Laryngeal elevation  was very poor negatively impacting airway protection and UES  opening.   Chin tuck posture along with dry swallows decreased  amount of pharyngeal stasis of liquids.  Head turn right with  chin tuck not effective.   Laryngeal penetration (silent) of small  amount of barium noted after swallow without pt awareness-  presumed from pyriform residuals spilling into open larynx.    Pt coughed during MBS without barium visualized in larynx -  suspect possible secretion aspiration occuring based on secretion  stasis in pharynx.    SLP only tested liquids due to appearance of stasis and narrowing  in mid-esophagus that did NOT clear with multiple swallows or  liquids. Diffiuclt situation due to multifactorial dysphagia as  pt with at risk of aspiration due to oropharyngeal weakness and  suspected esophageal component (appearance of narrowing with  retained secretions above - ? from extrinsic compression from  mass.  Please note radiologist not present during MBS to confirm  findings.      Recommend medicine be given IV and pt be allowed SMALL amounts of  tsps of thin water (? clears) with chin tuck and dry swallows for  comfort.    Rec dc intake if pt coughing or senses stasis *he did not have  sensation to poor clearance during entire test.  Pt given oral  suction and SLP demonstrated use to help clear secretions from  pharynx if able to cough.  As pt reports h/o dysphagia and  coughing up secretions x3 weeks, suspect acute on chronic  dysphagia.       Treatment Recommendation  Therapy as outlined in treatment plan below    Diet Recommendation  (? single tsps clears with chin tuck - SMALL  SINGLE TSPS ONLY)   Liquid Administration via: Spoon Medication Administration: Via alternative means Compensations: Multiple dry swallows after each bite/sip;Hard  cough after swallow Postural Changes and/or Swallow Maneuvers: Seated upright 90  degrees;Upright 30-60 min after meal;Chin tuck    Other  Recommendations Recommended Consults: MBS Oral Care Recommendations: Oral care Q4 per protocol   Follow Up Recommendations   ? Referral to address possible masses causing extrinsic  obstruction on esophagus   Frequency  and Duration min 2x/week  2  weeks        General Date of Onset: 06/23/13 HPI: 80 yo male adm to [facility] with hemopytsis.  Pt found to have  subcarinal mass with esophageal obstruction proximal and mid with  gas noted, Chest imaging showed post obstructive atelectasis LLL,  ? RLL infection/ ATX.  Pt reports 43 pound weight loss since Oct  2014 and aphonia x 2 1/2 months with gradual onset and no  worsening.  Pt also with choking on food more than drink per his  report.  RN reports pt coughing with medicine.  BSE ordered.  Type of Study: Modified Barium Swallowing Study Reason for Referral: Objectively evaluate swallowing function Diet Prior to this Study: NPO Temperature Spikes Noted: No Respiratory Status: Nasal cannula History of Recent Intubation: No Behavior/Cognition: Alert;Cooperative;Pleasant mood Oral Cavity - Dentition: Missing dentition (few teeth) Oral Motor / Sensory Function: Impaired - see Bedside swallow  eval Self-Feeding Abilities: Able to feed self Patient Positioning: Upright in chair Baseline Vocal Quality: Low vocal intensity;Breathy Volitional Cough: Weak Volitional Swallow: Able to elicit Pharyngeal Secretions: Standing secretions in (comment) (pharynx)     Reason for Referral Objectively evaluate swallowing function   Oral Phase Oral Preparation/Oral Phase Oral Phase: Impaired Oral - Nectar Oral - Nectar Teaspoon: Reduced posterior  propulsion;Lingual/palatal residue;Weak lingual manipulation Oral - Nectar Straw: Reduced posterior propulsion;Lingual/palatal  residue;Weak lingual manipulation Oral - Thin Oral - Thin Teaspoon: Reduced posterior  propulsion;Lingual/palatal residue;Weak lingual manipulation Oral - Thin Cup: Reduced posterior propulsion;Lingual/palatal  residue;Weak lingual manipulation Oral - Thin Straw: Reduced posterior propulsion;Lingual/palatal  residue;Weak lingual manipulation   Pharyngeal Phase Pharyngeal Phase Pharyngeal Phase: Impaired Pharyngeal - Nectar Pharyngeal - Nectar Teaspoon: Reduced epiglottic   inversion;Reduced pharyngeal peristalsis;Reduced anterior  laryngeal mobility;Reduced laryngeal elevation;Pharyngeal residue  - pyriform sinuses;Pharyngeal residue - valleculae;Trace  aspiration;Pharyngeal residue - cp segment Pharyngeal - Nectar Cup: Reduced epiglottic inversion;Reduced  pharyngeal peristalsis;Reduced anterior laryngeal  mobility;Reduced laryngeal elevation;Pharyngeal residue -  valleculae;Pharyngeal residue - pyriform sinuses;Reduced tongue  base retraction;Pharyngeal residue - cp  segment;Penetration/Aspiration after swallow Penetration/Aspiration details (nectar cup): Material enters  airway, remains ABOVE vocal cords and not ejected out Pharyngeal - Thin Pharyngeal - Thin Teaspoon: Reduced epiglottic inversion;Reduced  pharyngeal peristalsis;Reduced anterior laryngeal  mobility;Reduced laryngeal elevation;Pharyngeal residue -  valleculae;Pharyngeal residue - pyriform sinuses;Reduced tongue  base retraction;Pharyngeal residue - cp segment Pharyngeal - Thin Cup: Reduced epiglottic inversion;Reduced  pharyngeal peristalsis;Reduced anterior laryngeal  mobility;Reduced laryngeal elevation;Pharyngeal residue -  valleculae;Pharyngeal residue - pyriform sinuses;Reduced tongue  base retraction;Pharyngeal residue - cp segment Pharyngeal - Thin Straw: Reduced epiglottic inversion;Reduced  pharyngeal peristalsis;Reduced anterior laryngeal  mobility;Reduced laryngeal elevation;Pharyngeal residue -  valleculae;Pharyngeal residue - pyriform sinuses;Reduced tongue  base retraction;Pharyngeal residue - cp segment Pharyngeal Phase - Comment Pharyngeal Comment: chin tuck with liquids decreases amount of  pharyngeal accumulation, cued dry swallows - pt able to cough and  "hock" up mild amount of stasis x1, does not sense pharygneal  stasis  Cervical Esophageal Phase    GO    Cervical Esophageal Phase Cervical Esophageal Phase: Impaired Cervical Esophageal Phase - Nectar Nectar Teaspoon: Reduced cricopharyngeal  relaxation Nectar Cup: Reduced cricopharyngeal relaxation Cervical Esophageal Phase - Thin Thin Teaspoon: Reduced cricopharyngeal relaxation Thin Cup: Reduced cricopharyngeal relaxation Thin Straw: Reduced cricopharyngeal relaxation         Luanna Salk, MS [facility] SLP [phone]     Review of Systems  Respiratory: Positive for hemoptysis and shortness of breath. Negative for wheezing.   Cardiovascular:  Negative.   Gastrointestinal: Negative for nausea, vomiting and abdominal pain.  Genitourinary: Negative.   Musculoskeletal: Negative.   Neurological: Negative.    Blood pressure 139/73, pulse 74, temperature 98 F (36.7 C), temperature source Oral, resp. rate 18, height 6\' 5"  (1.956 m), weight 245 lb 6 oz (111.3 kg), SpO2 96.00%. Physical Exam  Constitutional: He is oriented to person, place, and time. He appears well-developed and well-nourished. No distress.  Cardiovascular: Normal rate, regular rhythm, normal heart sounds and intact distal pulses.   tachycardic  Respiratory: Effort normal.  Coarse bilaterally  GI: Soft. Bowel sounds are normal. He exhibits no distension and no mass. There is no tenderness. There is no rebound and no guarding.  Midline scar  Neurological: He is alert and oriented to person, place, and time.  Skin: He is not diaphoretic.  Hard immovable mass to right upper back without erythema.  Right flank region lipoma  Psychiatric: He has a normal mood and affect. His behavior is normal.   Left lower back 4.5x4.5cm mass without active bleeding  Assessment/Plan: Lower back bleeding lesion-questionable SCC.  Hemostasis achieved, recommend non adhesive dressing, then 4x4 guaze.  May apply silver nitrate sticks prn for bleeding. Pending lung biopsy results, may need biopsy in the near future for diagnosis. Will sign off.  Please do not hesitate to call [facility] with any questions or concerns.   Jiovanna Frei ANP-BC 06/25/2013, 11:29 AM

## 2013-06-25 NOTE — Procedures (Addendum)
Bronchoscopy Procedure Note Austin [name] [id number] Sep 18, 1926  Procedure: Bronchoscopy Indications: Diagnostic evaluation of the airways and Obtain specimens for culture and/or other diagnostic studies  Procedure Details Consent: Risks of procedure as well as the alternatives and risks of each were explained to the (patient/caregiver).  Consent for procedure obtained. Time Out: Verified patient identification, verified procedure, site/side was marked, verified correct patient position, special equipment/implants available, medications/allergies/relevent history reviewed, required imaging and test results available.  Performed  In preparation for procedure, patient was given 100% FiO2 and bronchoscope lubricated. Sedation: Benzodiazepines and Fentanyl  Airway entered and the following bronchi were examined: RUL, RML, RLL, LUL, LLL and Bronchi.   Procedures performed: Brushings performed at the LUL. Wang needle biopsies done at the main carina aimed posteriorily. BAL from the LUL. Transbronchial needle aspiration biopsy at the first left sided sub segmental carina. Bronchoscope removed.    Evaluation Hemodynamic Status: BP stable throughout; O2 sats: stable throughout Patient's Current Condition: stable Specimens:  Sent serosanguinous fluid Complications: No apparent complications Patient did tolerate procedure well.  Mucosa at the left main stem was very irritatable and immediate bleeding started with simple brushing that was controlled with time.  External compression noted in the first 4-5 cm of the left mainstem.  [name] 06/25/2013

## 2013-06-25 NOTE — Progress Notes (Signed)
[facility] PROGRESS NOTE  Austin [name] TKZ:[id number] DOB: 1926/08/12 DOA: 06/21/2013 PCP: Austin Sake, MD  Assessment/Plan: Active Problems:  Lung mass Hemoptysis  Chest mass  Dysphagia  Atrial fibrillation  Hyperlipemia  Hypertension  COPD  80 y/o male with PMH of HTN, COPD, HPL, A fib, CVA, recent sepsis, recurrent pneumonia, c diff presented with hemoptysis found to have lung mass   1. Hemoptysis Lung mass likely lung CA (+tobacco use); / LUNG CA with mets to esophagus  - CT: abnormal mediastinal and left hilar mass producing  mass effect upon the left main pulmonary artery mass is also resulting in esophageal obstruction with the proximal and mid portions of the esophagus The appearance is consistent with a central lung malignancy or malignancy of the distal esophagus. - pulmonary: plan for bronchoscopy and biopsy monday; hold Xarelto - prognosis guarded; palliative care consulted   2. Dysphagia due esophageal mass lesion: failed swallow evaluation; at risk for aspiration, recent h/p aspiration PNA - esophagogram: Near complete obstruction of the mid esophagus with irregular  tapering of the esophagus atthis level concerning for esophageal mass/ neoplasm. - NPO, IVF, consulted GI eval/EGD/biopsy - d/w patient, famiyl about PEG vs comfort feeding; they will think  About it   3. Atrial fibrillation: rate Controlled, continue with metoprolol and digoxin, hold anticoagulation it due to to #1   4. Hypertension: Acceptable continue with metoprolol   5. Hyperlipedemia: cont with statin   6. COPD; h/o tobacco use; no wheezing on exam; cont bronchodilators as needed   7. History of CVA holding Xarelto due to hemoptysis; continue statin  -no new symptoms;   8. History of recent left hip repair  - per notes: Dr. Alvan [name] on 1/28-okay for 50% weightbearing on the left lower extremity. Patient to followup with Dr. Alvan [name] in in one month for repeat x-rays, if continues to do  well with good healing, patient will likely be advanced to more weightbearing status -xray: 1/28: Healing fracture involving the distal shaft of the left femur status post sideplate and screw device reduction  Prognosis is guarded, family is interested in learning about hospice care  DVT Prophylaxis SCDs    Code Status: DNR Family Communication: d/w patient, updated daughter Austin, [name] Daughter [phone]  (indicate person spoken with, relationship, and if by phone, the number) Disposition Plan: pend clinical improvement    Consultants:  Pulmonology   Procedures:  None   Antibiotics:  None  (indicate start date, and stop date if known)  HPI/Subjective: alert  Objective: Filed Vitals:   06/25/13 0500  BP: 139/73  Pulse: 74  Temp: 98 F (36.7 C)  Resp: 18    Intake/Output Summary (Last 24 hours) at 06/25/13 0947 Last data filed at 06/25/13 0511  Gross per 24 hour  Intake 1678.75 ml  Output   1250 ml  Net 428.75 ml   Filed Weights   06/21/13 2240  Weight: 111.3 kg (245 lb 6 oz)    Exam:   General:  alert  Cardiovascular: s1,s2 rrr  Respiratory: LL poor AE  Abdomen: soft, nt, nd   Musculoskeletal: no edema   Data Reviewed: Basic Metabolic Panel:  Recent Labs Lab 06/21/13 1640 06/22/13 0440  NA 134* 138  K 4.1 3.8  CL 95* 99  CO2 26 27  GLUCOSE 107* 97  BUN 13 13  CREATININE 0.63 0.67  CALCIUM 10.0 9.9   Liver Function Tests:  Recent Labs Lab 06/22/13 0440  AST 16  ALT 17  ALKPHOS  136*  BILITOT 0.7  PROT 6.2  ALBUMIN 2.6*   No results found for this basename: LIPASE, AMYLASE,  in the last 168 hours No results found for this basename: AMMONIA,  in the last 168 hours CBC:  Recent Labs Lab 06/21/13 1640 06/24/13 0518  WBC 8.4 6.9  NEUTROABS 5.5  --   HGB 12.3* 11.1*  HCT 36.4* 32.9*  MCV 89.0 89.4  PLT 185 181   Cardiac Enzymes: No results found for this basename: CKTOTAL, CKMB, CKMBINDEX,  TROPONINI,  in the last 168 hours BNP (last 3 results)  Recent Labs  05/03/13 1319  PROBNP 622.4*   CBG: No results found for this basename: GLUCAP,  in the last 168 hours  No results found for this or any previous visit (from the past 240 hour(s)).   Studies: Dg Esophagus  06/23/2013   CLINICAL DATA:  Dysphagia. Blockage seen on modified barium swallow.  EXAM: ESOPHOGRAM/BARIUM SWALLOW  TECHNIQUE: Single contrast examination was performed using  thin barium.  COMPARISON:  Chest CT 06/21/2013  FLUOROSCOPY TIME:  1 min, 35 seconds  FINDINGS: Prior to the patient ingesting additional barium, fluoroscopy demonstrates retained food/debris as well as barium from a earlier modified barium swallow study. The patient was able to take several swallows of thin barium. This mixes with the foods/ debris in the obstructed proximal to mid esophagus, with irregular tapering just below the aortic arch compatible with near complete obstructing esophageal mass. A small amount of contrast passes through this area in into the distal esophagus and stomach.  IMPRESSION: Near complete obstruction of the mid esophagus with irregular tapering of the esophagus at this level concerning for esophageal mass/ neoplasm.   Electronically Signed   By: Austin [name] M.D.   On: 06/23/2013 17:25   Dg Swallowing Func-speech Pathology  06/23/2013   Austin [name], CCC-SLP     06/23/2013  4:27 PM Objective Swallowing Evaluation: Modified Barium Swallowing Study   Patient Details  Name: Austin [name] MRN: [id number] Date of Birth: 11-11-1926  Today's Date: 06/23/2013 Time: 7902-4097 SLP Time Calculation (min): 52 min  Past Medical History:  Past Medical History  Diagnosis Date  . Hypertension   . Hyperlipidemia   . AAA (abdominal aortic aneurysm)   . Arthritis   . CAD (coronary artery disease)   . Thyroid disease     hypothyroidism  . Dysrhythmia     atrial flutter  . Shortness of breath     a little with exertion  . Polio 1934  .  Atrial flutter     chronic anticoagulation  . COPD (chronic obstructive pulmonary disease)   . History of nuclear stress test 04/2010    dipyridamole; small, mostly fixed basal to mid inferior and  inferoseptal defect (gut artifact v. scar); post stress EF 62%;  non-diagnostic for ischemia; low risk    Past Surgical History:  Past Surgical History  Procedure Laterality Date  . Abdominal aortic aneurysm repair  05/21/2010    aorto bi-iliac B[facility]  . Appendectomy  1949  . Popliteal synovial cyst excision Left   . Joint replacement      Knee and Hip replacements  . Total hip arthroplasty Left 1994  . Knee arthroplasty Left 2004  . Total hip revision Left 12/04/2012    Procedure: LEFT TOTAL HIP REVISION;  Surgeon: Mauri Pole,  MD;  Location: [facility];  Service: Orthopedics;  Laterality: Left;  . Cardiac catheterization  2009    in [place]; 50-60%  RCA  . Transthoracic echocardiogram  04/2010    EF =>55%; RV borderline dilated; LA mildly dilated; mild mitral  annular calcif & mild MR; mild TR; mild calcif of AV leaflets  with mild AV stenosis; aortic root sclerosis/calcif  . Orif femur fracture Left 01/22/2013    Procedure: OPEN REDUCTION INTERNAL FIXATION (ORIF) DISTAL FEMUR  FRACTURE;  Surgeon: Mauri Pole, MD;  Location: [place];   Service: Orthopedics;  Laterality: Left;   HPI:  80 yo male adm to [facility] with hemopytsis.  Pt found to have  subcarinal mass with esophageal obstruction proximal and mid with  gas noted, Chest imaging showed post obstructive atelectasis LLL,  ? RLL infection/ ATX.  Pt reports 43 pound weight loss since Oct  2014 and aphonia x 2 1/2 months with gradual onset and no  worsening.  Pt also with choking on food more than drink per his  report.  RN reports pt coughing with medicine.  BSE ordered.      Assessment / Plan / Recommendation Clinical Impression  Dysphagia Diagnosis: Mild oral phase dysphagia;Moderate  pharyngeal phase dysphagia;Suspected primary esophageal  dysphagia;Moderate cervical esophageal  phase dysphagia   Clinical impression: Mild oral and moderate pharyngeal-cervical  esophageal dysphagia with suspected primary esophageal deficits.   Pt has sensorimotor deficits resulting in weak muscle contraction  and pharyngeal stasis without pt awareness.  Laryngeal elevation  was very poor negatively impacting airway protection and UES  opening.   Chin tuck posture along with dry swallows decreased  amount of pharyngeal stasis of liquids.  Head turn right with  chin tuck not effective.  Laryngeal penetration (silent) of small  amount of barium noted after swallow without pt awareness-  presumed from pyriform residuals spilling into open larynx.    Pt coughed during MBS without barium visualized in larynx -  suspect possible secretion aspiration occuring based on secretion  stasis in pharynx.    SLP only tested liquids due to appearance of stasis and narrowing  in mid-esophagus that did NOT clear with multiple swallows or  liquids. Diffiuclt situation due to multifactorial dysphagia as  pt with at risk of aspiration due to oropharyngeal weakness and  suspected esophageal component (appearance of narrowing with  retained secretions above - ? from extrinsic compression from  mass.  Please note radiologist not present during MBS to confirm  findings.      Recommend medicine be given IV and pt be allowed SMALL amounts of  tsps of thin water (? clears) with chin tuck and dry swallows for  comfort.    Rec dc intake if pt coughing or senses stasis *he did not have  sensation to poor clearance during entire test.  Pt given oral  suction and SLP demonstrated use to help clear secretions from  pharynx if able to cough.  As pt reports h/o dysphagia and  coughing up secretions x3 weeks, suspect acute on chronic  dysphagia.       Treatment Recommendation  Therapy as outlined in treatment plan below    Diet Recommendation  (? single tsps clears with chin tuck - SMALL  SINGLE TSPS ONLY)   Liquid Administration via: Spoon  Medication Administration: Via alternative means Compensations: Multiple dry swallows after each bite/sip;Hard  cough after swallow Postural Changes and/or Swallow Maneuvers: Seated upright 90  degrees;Upright 30-60 min after meal;Chin tuck    Other  Recommendations Recommended Consults: MBS Oral Care Recommendations: Oral care Q4 per protocol   Follow Up Recommendations   ?  Referral to address possible masses causing extrinsic  obstruction on esophagus   Frequency and Duration min 2x/week  2 weeks        General Date of Onset: 06/23/13 HPI: 80 yo male adm to [facility] with hemopytsis.  Pt found to have  subcarinal mass with esophageal obstruction proximal and mid with  gas noted, Chest imaging showed post obstructive atelectasis LLL,  ? RLL infection/ ATX.  Pt reports 43 pound weight loss since Oct  2014 and aphonia x 2 1/2 months with gradual onset and no  worsening.  Pt also with choking on food more than drink per his  report.  RN reports pt coughing with medicine.  BSE ordered.  Type of Study: Modified Barium Swallowing Study Reason for Referral: Objectively evaluate swallowing function Diet Prior to this Study: NPO Temperature Spikes Noted: No Respiratory Status: Nasal cannula History of Recent Intubation: No Behavior/Cognition: Alert;Cooperative;Pleasant mood Oral Cavity - Dentition: Missing dentition (few teeth) Oral Motor / Sensory Function: Impaired - see Bedside swallow  eval Self-Feeding Abilities: Able to feed self Patient Positioning: Upright in chair Baseline Vocal Quality: Low vocal intensity;Breathy Volitional Cough: Weak Volitional Swallow: Able to elicit Pharyngeal Secretions: Standing secretions in (comment) (pharynx)     Reason for Referral Objectively evaluate swallowing function   Oral Phase Oral Preparation/Oral Phase Oral Phase: Impaired Oral - Nectar Oral - Nectar Teaspoon: Reduced posterior  propulsion;Lingual/palatal residue;Weak lingual manipulation Oral - Nectar Straw: Reduced posterior  propulsion;Lingual/palatal  residue;Weak lingual manipulation Oral - Thin Oral - Thin Teaspoon: Reduced posterior  propulsion;Lingual/palatal residue;Weak lingual manipulation Oral - Thin Cup: Reduced posterior propulsion;Lingual/palatal  residue;Weak lingual manipulation Oral - Thin Straw: Reduced posterior propulsion;Lingual/palatal  residue;Weak lingual manipulation   Pharyngeal Phase Pharyngeal Phase Pharyngeal Phase: Impaired Pharyngeal - Nectar Pharyngeal - Nectar Teaspoon: Reduced epiglottic  inversion;Reduced pharyngeal peristalsis;Reduced anterior  laryngeal mobility;Reduced laryngeal elevation;Pharyngeal residue  - pyriform sinuses;Pharyngeal residue - valleculae;Trace  aspiration;Pharyngeal residue - cp segment Pharyngeal - Nectar Cup: Reduced epiglottic inversion;Reduced  pharyngeal peristalsis;Reduced anterior laryngeal  mobility;Reduced laryngeal elevation;Pharyngeal residue -  valleculae;Pharyngeal residue - pyriform sinuses;Reduced tongue  base retraction;Pharyngeal residue - cp  segment;Penetration/Aspiration after swallow Penetration/Aspiration details (nectar cup): Material enters  airway, remains ABOVE vocal cords and not ejected out Pharyngeal - Thin Pharyngeal - Thin Teaspoon: Reduced epiglottic inversion;Reduced  pharyngeal peristalsis;Reduced anterior laryngeal  mobility;Reduced laryngeal elevation;Pharyngeal residue -  valleculae;Pharyngeal residue - pyriform sinuses;Reduced tongue  base retraction;Pharyngeal residue - cp segment Pharyngeal - Thin Cup: Reduced epiglottic inversion;Reduced  pharyngeal peristalsis;Reduced anterior laryngeal  mobility;Reduced laryngeal elevation;Pharyngeal residue -  valleculae;Pharyngeal residue - pyriform sinuses;Reduced tongue  base retraction;Pharyngeal residue - cp segment Pharyngeal - Thin Straw: Reduced epiglottic inversion;Reduced  pharyngeal peristalsis;Reduced anterior laryngeal  mobility;Reduced laryngeal elevation;Pharyngeal residue -   valleculae;Pharyngeal residue - pyriform sinuses;Reduced tongue  base retraction;Pharyngeal residue - cp segment Pharyngeal Phase - Comment Pharyngeal Comment: chin tuck with liquids decreases amount of  pharyngeal accumulation, cued dry swallows - pt able to cough and  "hock" up mild amount of stasis x1, does not sense pharygneal  stasis  Cervical Esophageal Phase    GO    Cervical Esophageal Phase Cervical Esophageal Phase: Impaired Cervical Esophageal Phase - Nectar Nectar Teaspoon: Reduced cricopharyngeal relaxation Nectar Cup: Reduced cricopharyngeal relaxation Cervical Esophageal Phase - Thin Thin Teaspoon: Reduced cricopharyngeal relaxation Thin Cup: Reduced cricopharyngeal relaxation Thin Straw: Reduced cricopharyngeal relaxation         Luanna Salk, MS [facility] SLP [phone]     Scheduled Meds: . cholecalciferol  1,000 Units Oral Daily  . digoxin  0.125 mg Oral Daily  . guaiFENesin  600 mg Oral BID  . levothyroxine  75 mcg Intravenous Daily  . metoprolol tartrate  25 mg Oral BID  . multivitamin with minerals  1 tablet Oral Daily  . simvastatin  20 mg Oral q1800   Continuous Infusions: . dextrose 5 % and 0.9 % NaCl with KCl 20 mEq/L 75 mL/hr at 06/24/13 2128    Active Problems:   Hemoptysis   Chest mass   Dysphagia   Atrial fibrillation   Hyperlipemia   Hypertension   Malnutrition of moderate degree    Time spent: >35 minutes    Kinnie Feil  [facility] Pager [phone]. If 7PM-7AM, please contact night-coverage at [email or web address], password [facility] 06/25/2013, 9:47 AM  LOS: 4 days

## 2013-06-25 NOTE — Progress Notes (Signed)
   CARE MANAGEMENT NOTE 06/25/2013  Patient:  Austin [name], Austin [name]   Account Number:  [id number]  Date Initiated:  06/23/2013  Documentation initiated by:  Dessa Phi  Subjective/Objective Assessment:   80 Y/O M ADMITTED W/HEMOPTYSIS.     Action/Plan:   FROM HOME.HAS PCP,PHARMACY.   Anticipated DC Date:  06/25/2013   Anticipated DC Plan:  [place]  CM consult      Choice offered to / List presented to:             Status of service:  Completed, signed off Medicare Important Message given?   (If response is "NO", the following Medicare IM given date fields will be blank) Date Medicare IM given:   Date Additional Medicare IM given:    Discharge Disposition:    Per UR Regulation:  Reviewed for med. necessity/level of care/duration of stay  If discussed at [place] of Stay Meetings, dates discussed:    Comments:  06/25/2013 1545 NCM spoke to pt and gave permission to speak with son, Rogerio # [phone] and Lillia Abed. Pt lives in home with Lillia Abed  # [phone], c- # [phone]. Children want to wait for final recommendations for home after procedure today. Will speak to pt and family on 3/10 about [place] or Home Hospice. Jonnie Finner RN CCM Case Mgmt phone [phone]  06/24/2013 1120 Pt is active with [facility] for [facility]. Jonnie Finner RN CCM Case Mgmt phone [phone]

## 2013-06-25 NOTE — Consult Note (Addendum)
WOC wound consult note Reason for Consult:  Buttocks reddended but blanch, no pressure ulcer noted.  Barrier cream to protect skin.   Consult requested for left posterior flank/axillary area.  Pt does not know how long this site has been present and has not been performing any topical care to site.  Wound type: This is NOT a wound.  Appears to be some sort of lesion such as squamous cell?  Pt has cancer noted internally on CT scan in left axillary area which could possibly correlate.  This is beyond [facility] scope of practice.  Discussed plan of care with primary team.  Recommend surgical consult since wound may require silver nitrate or cauterization to stop bleeding.  Measurement:4.5X4.5cm Wound bed:100% red, hard to palpation, raised above skin level Drainage (amount, consistency, odor) Large amt bright red blood draining from center of lesion. Periwound: Intact skin surrounding. Dressing procedure/placement/frequency: Applied xeroform gauze to help decrease bleeding and ABD pressure dressing until further input available from surgical team. Please re-consult if further assistance is needed.  Thank-you,  Julien Girt MSN, [place], [place], [place], [place]

## 2013-06-25 NOTE — Progress Notes (Signed)
Awaiting bronchoscopy findings, which per chart appears to be scheduled for this afternoon..  The involvement and GI management strategies will principally depend upon pulmonary specialist findings.  Will revisit tomorrow.

## 2013-06-26 ENCOUNTER — Ambulatory Visit: Payer: Medicare Other | Admitting: Cardiology

## 2013-06-26 ENCOUNTER — Encounter (HOSPITAL_COMMUNITY): Payer: Self-pay | Admitting: Pulmonary Disease

## 2013-06-26 MED ORDER — GUAIFENESIN 100 MG/5ML PO SYRP
200.0000 mg | ORAL_SOLUTION | ORAL | Status: DC | PRN
  Administered 2013-06-27 – 2013-06-29 (×2): 200 mg via ORAL
  Filled 2013-06-26 (×3): qty 10

## 2013-06-26 MED ORDER — DIGOXIN 0.25 MG/ML IJ SOLN
0.1250 mg | Freq: Every day | INTRAMUSCULAR | Status: DC
  Administered 2013-06-26 – 2013-07-03 (×8): 0.125 mg via INTRAVENOUS
  Filled 2013-06-26 (×9): qty 0.5

## 2013-06-26 MED ORDER — LEVOTHYROXINE SODIUM 100 MCG IV SOLR
37.5000 ug | Freq: Every day | INTRAVENOUS | Status: DC
  Administered 2013-06-26 – 2013-07-04 (×9): 37.5 ug via INTRAVENOUS
  Filled 2013-06-26 (×10): qty 5

## 2013-06-26 MED ORDER — ENSURE COMPLETE PO LIQD
237.0000 mL | Freq: Two times a day (BID) | ORAL | Status: DC
  Administered 2013-06-26 – 2013-06-28 (×3): 237 mL via ORAL

## 2013-06-26 NOTE — Progress Notes (Signed)
UR completed. Latisia Hilaire RN CCM Case Mgmt phone [phone] 

## 2013-06-26 NOTE — Progress Notes (Signed)
SLP Cancellation Note  Patient Details Name: NAKIA KOBLE MRN: [id number] DOB: 29-Jan-1927   Cancelled treatment:       Reason Eval/Treat Not Completed:  (pt remains NPO awaiting decision re: if pt desires to pursue tx)  SLP to follow up at another time.  GI/MD - would pt be able to have few tsps of water or single melted ice chips with chin tuck posture for comfort.     Janett Labella [place], [place] [facility] SLP [phone]

## 2013-06-26 NOTE — Progress Notes (Signed)
Subjective: Some mild hemoptysis after procedure.  Objective: Vital signs in last 24 hours: Temp:  [97.5 F (36.4 C)-98.2 F (36.8 C)] 97.6 F (36.4 C) (03/10 0531) Pulse Rate:  [94-129] 119 (03/10 0531) Resp:  [15-88] 20 (03/10 0531) BP: (94-165)/(60-112) 117/75 mmHg (03/10 0531) SpO2:  [91 %-99 %] 97 % (03/10 0531) Weight change:  Last BM Date: 06/22/13  PE: GEN:  NAD, hoarse  Lab Results: CBC    Component Value Date/Time   WBC 6.9 06/24/2013 0518   RBC 3.68* 06/24/2013 0518   HGB 11.1* 06/24/2013 0518   HCT 32.9* 06/24/2013 0518   PLT 181 06/24/2013 0518   MCV 89.4 06/24/2013 0518   MCH 30.2 06/24/2013 0518   MCHC 33.7 06/24/2013 0518   RDW 16.9* 06/24/2013 0518   LYMPHSABS 2.1 06/21/2013 1640   MONOABS 0.7 06/21/2013 1640   EOSABS 0.1 06/21/2013 1640   BASOSABS 0.0 06/21/2013 1640   CMP     Component Value Date/Time   NA 138 06/22/2013 0440   K 3.8 06/22/2013 0440   CL 99 06/22/2013 0440   CO2 27 06/22/2013 0440   GLUCOSE 97 06/22/2013 0440   BUN 13 06/22/2013 0440   CREATININE 0.67 06/22/2013 0440   CALCIUM 9.9 06/22/2013 0440   PROT 6.2 06/22/2013 0440   ALBUMIN 2.6* 06/22/2013 0440   AST 16 06/22/2013 0440   ALT 17 06/22/2013 0440   ALKPHOS 136* 06/22/2013 0440   BILITOT 0.7 06/22/2013 0440   GFRNONAA 86* 06/22/2013 0440   GFRAA >90 06/22/2013 0440   Bronchoscopy biopsies pending  Assessment:  1.  Hilar mass with partial airway and esophageal occlusion.  Suspect lung cancer with esophageal invasion, but can't rule out esophageal primary with lung invasion. 2.  Dysphagia.  Despite dramatic esophagram appearance, patient tells me that his dysphagia is only intermittent and that he can actually tolerate solids most of the time, and liquids pretty much all of the time.  Plan:  1.  Awaiting bronchoscopy biopsies. 2.  I have discussed case at length with Dr. Nelda Marseille of pulmonary consultants.  If biopsies show malignancy, which is very much suspected, I would obtain oncology consultation for management  strategies.  If cancer is confirmed and oncology opts for radiation, this may very well improve patient's lung and esophageal partial occlusion enough that no further intervention is required, especially since patient tells me he is still able to tolerate liquids and some solids at this time. 3.  I hope radiation and/or palliative chemotherapy will be sufficient palliation for his partial esophageal obstruction, but I will consider esophageal stent placement if desired by oncology consultants.  However, since patient has compression of his trachea as well, as confirmed on recent bronchoscopy, the placement of an esophageal stent will require a combined procedure with pulmonary and would in all likelihood require concomitant tracheal stent placement since the esophageal stent will likely compress the patient's trachea even further. 4.  Will hold off on further evaluation at this time.  Happy to see again upon direct request by primary team or pulmonary or oncology consultants.   Austin [name] 06/26/2013, 8:18 AM

## 2013-06-26 NOTE — Progress Notes (Addendum)
[facility] PROGRESS NOTE  Austin [name] BZJ:[id number] DOB: 1926-10-24 DOA: 06/21/2013 PCP: [name], MD  Assessment/Plan: Active Problems:  Lung mass Hemoptysis  Chest mass  Dysphagia  Atrial fibrillation  Hyperlipemia  Hypertension  COPD  80 y/o male with PMH of HTN, COPD, HPL, A fib, CVA, recent sepsis, recurrent pneumonia, c diff presented with hemoptysis found to have lung, esophageal mass   1. Hemoptysis/Lung mass likely lung CA (+tobacco use); likely lung CA with mets to esophagus  - CT: abnormal mediastinal and left hilar mass producing  mass effect upon the left main pulmonary artery mass is also resulting in esophageal obstruction with the proximal and mid portions of the esophagus The appearance is consistent with a central lung malignancy or malignancy of the distal esophagus. - 3/9: s/p bronchoscopy and biopsy pend cytology; called oncology evaluation;  - cultures +GPC, but afebrile; no leukocytosis; competed atx last month; monitor off atx;  - prognosis guarded; palliative care consulted   2. Dysphagia due esophageal mass lesion: failed swallow evaluation; at risk for aspiration, recent h/p aspiration PNA - esophagogram: Near complete obstruction of the mid esophagus with irregular  tapering of the esophagus atthis level concerning for esophageal mass/ neoplasm. - per GI hold EGD; ? Need stenting  - d/w patient, family they want palliative feeding, understanding the risk of aspiration;   3. Atrial fibrillation: rate Controlled, continue with metoprolol and digoxin, hold anticoagulation it due to to #1   4. Hypertension: Acceptable continue with metoprolol   5. Hyperlipedemia: cont with statin   6. COPD; h/o tobacco use; no wheezing on exam; cont bronchodilators as needed   7. History of CVA holding Xarelto due to hemoptysis; continue statin  -no new symptoms;   8. History of recent left hip repair  - per notes: Dr. Alvan Dame on 1/28-okay for 50%  weightbearing on the left lower extremity. Patient to followup with Dr. Alvan Dame in in one month for repeat x-rays, if continues to do well with good healing, patient will likely be advanced to more weightbearing status -xray: 1/28: Healing fracture involving the distal shaft of the left femur status post sideplate and screw device reduction  Prognosis is guarded,  -likely need hospice care   DVT Prophylaxis SCDs    Code Status: DNR Family Communication: d/w patient, updated daughter [name], [name] Daughter [phone]  (indicate person spoken with, relationship, and if by phone, the number) Disposition Plan: pend clinical improvement    Consultants:  Pulmonology   Procedures:  None   Antibiotics:  None  (indicate start date, and stop date if known)  HPI/Subjective: alert  Objective: Filed Vitals:   06/26/13 1014  BP:   Pulse: 100  Temp: 97.9 F (36.6 C)  Resp:     Intake/Output Summary (Last 24 hours) at 06/26/13 1206 Last data filed at 06/26/13 1000  Gross per 24 hour  Intake    960 ml  Output   1100 ml  Net   -140 ml   Filed Weights   06/21/13 2240  Weight: 111.3 kg (245 lb 6 oz)    Exam:   General:  alert  Cardiovascular: s1,s2 rrr  Respiratory: LL poor AE  Abdomen: soft, nt, nd   Musculoskeletal: no edema   Data Reviewed: Basic Metabolic Panel:  Recent Labs Lab 06/21/13 1640 06/22/13 0440  NA 134* 138  K 4.1 3.8  CL 95* 99  CO2 26 27  GLUCOSE 107* 97  BUN 13 13  CREATININE 0.63 0.67  CALCIUM  10.0 9.9   Liver Function Tests:  Recent Labs Lab 06/22/13 0440  AST 16  ALT 17  ALKPHOS 136*  BILITOT 0.7  PROT 6.2  ALBUMIN 2.6*   No results found for this basename: LIPASE, AMYLASE,  in the last 168 hours No results found for this basename: AMMONIA,  in the last 168 hours CBC:  Recent Labs Lab 06/21/13 1640 06/24/13 0518  WBC 8.4 6.9  NEUTROABS 5.5  --   HGB 12.3* 11.1*  HCT 36.4* 32.9*  MCV 89.0 89.4   PLT 185 181   Cardiac Enzymes: No results found for this basename: CKTOTAL, CKMB, CKMBINDEX, TROPONINI,  in the last 168 hours BNP (last 3 results)  Recent Labs  05/03/13 1319  PROBNP 622.4*   CBG: No results found for this basename: GLUCAP,  in the last 168 hours  Recent Results (from the past 240 hour(s))  CULTURE, BAL-QUANTITATIVE     Status: None   Collection Time    06/25/13  4:52 PM      Result Value Ref Range Status   Specimen Description BRONCHIAL ALVEOLAR LAVAGE   Final   Special Requests NONE   Final   Gram Stain     Final   Value: ABUNDANT WBC PRESENT, PREDOMINANTLY PMN     RARE SQUAMOUS EPITHELIAL CELLS PRESENT     FEW GRAM POSITIVE COCCI IN PAIRS AND CHAINS     Performed at [facility] Count PENDING   Incomplete   Culture PENDING   Incomplete   Report Status PENDING   Incomplete  FUNGUS CULTURE W SMEAR     Status: None   Collection Time    06/25/13  4:52 PM      Result Value Ref Range Status   Specimen Description BRONCHIAL ALVEOLAR LAVAGE   Final   Special Requests NONE   Final   Fungal Smear     Final   Value: NO YEAST OR FUNGAL ELEMENTS SEEN     Performed at [facility]   Culture     Final   Value: CULTURE IN PROGRESS FOR FOUR WEEKS     Performed at [facility]   Report Status PENDING   Incomplete     Studies: No results found.  Scheduled Meds: . cholecalciferol  1,000 Units Oral Daily  . digoxin  0.125 mg Intravenous Daily  . guaiFENesin  600 mg Oral BID  . levothyroxine  37.5 mcg Intravenous Daily  . metoprolol tartrate  25 mg Oral BID  . multivitamin with minerals  1 tablet Oral Daily  . simvastatin  20 mg Oral q1800   Continuous Infusions: . sodium chloride 20 mL/hr at 06/25/13 1611  . dextrose 5 % and 0.9 % NaCl with KCl 20 mEq/L 75 mL/hr at 06/26/13 1005    Active Problems:   Hemoptysis   Chest mass   Dysphagia   Atrial fibrillation   Hyperlipemia   Hypertension   Malnutrition of moderate  degree    Time spent: >35 minutes    Kinnie Feil  [facility] Pager [phone]. If 7PM-7AM, please contact night-coverage at [email or web address], password [facility] 06/26/2013, 12:06 PM  LOS: 5 days

## 2013-06-27 DIAGNOSIS — M79609 Pain in unspecified limb: Secondary | ICD-10-CM

## 2013-06-27 NOTE — Progress Notes (Signed)
Thank you for consulting the Palliative Medicine Team at [facility] to meet your patient's and family's needs.   The reason that you asked [place] to see your patient is  For  Clarification of GOC and options  We have scheduled your patient for a meeting:  06-28-13 @ 2pm  with Wadie Lessen NP  Three children to be present  Your patient is able/unable to participate:yes  Wadie Lessen NP  Palliative Medicine Team Team Phone # [phone] Pager [phone]

## 2013-06-27 NOTE — Progress Notes (Signed)
Patient Demographics  Austin [name], is a 80 y.o. male, DOB - 07-31-1926, LDJ:[id number]  Admit date - 06/21/2013   Admitting Physician Austin Climes, MD  Outpatient Primary MD for the patient is Austin Sake, MD  LOS - 6   Chief Complaint  Patient presents with  . Hematemesis        Assessment & Plan    80 y/o male with PMH of HTN, COPD, HPL, A fib, CVA, recent sepsis, recurrent pneumonia, c diff presented with hemoptysis found to have lung, esophageal mass.      1. Hemoptysis/Lung mass likely lung CA (+tobacco use); likely lung CA with mets to esophagus  - CT: abnormal mediastinal and left hilar mass producing mass effect upon the left main pulmonary artery mass is also resulting in esophageal obstruction with the proximal and mid portions of the esophagus The appearance is consistent with a central lung malignancy or malignancy of the distal esophagus. Old xaralto for chronic anticoagulation for atrial fibrillation , eating currently stable.  3/9: s/p bronchoscopy and biopsy pend cytology; called oncology evaluation;  - cultures +GPC, but afebrile; no leukocytosis; competed atx last month; monitor off abx;  - prognosis guarded; palliative care consulted      2. Dysphagia due esophageal mass lesion: failed swallow evaluation; at risk for aspiration, recent h/p aspiration PNA  - esophagogram: Near complete obstruction of the mid esophagus with irregular tapering of the esophagus atthis level concerning for esophageal mass/ neoplasm.  - per GI hold EGD; ? Need stenting (if done will need tracheal stenting as well) - d/w patient, family they want palliative feeding, understanding the risk of aspiration;      3. Atrial fibrillation: rate Controlled, continue with metoprolol and  digoxin, hold anticoagulation it due to to #1     4. Hypertension: Acceptable continue with metoprolol     5. Hyperlipedemia: cont with statin     6. COPD; h/o tobacco use; no wheezing on exam; cont bronchodilators as needed     7. History of CVA holding Xarelto due to hemoptysis; continue statin  -no new symptoms;     8. History of recent left hip repair  - per notes: Dr. Alvan [name] on 1/28-okay for 50% weightbearing on the left lower extremity. Patient to followup with Dr. Alvan [name] in in one month for repeat x-rays, if continues to do well with good healing, patient will likely be advanced to more weightbearing status  -xray: 1/28: Healing fracture involving the distal shaft of the left femur status post sideplate and screw device reduction     Prognosis is guarded,  -likely will need hospice care       Code Status: DNR  Family Communication:    Disposition Plan:  TBD   Procedures     Consults   PCCM, Pall care, Oncology, GI, CCS   Medications  Scheduled Meds: . cholecalciferol  1,000 Units Oral Daily  . digoxin  0.125 mg Intravenous Daily  . feeding supplement (ENSURE COMPLETE)  237 mL Oral BID BM  . levothyroxine  37.5 mcg Intravenous Daily  . metoprolol tartrate  25 mg Oral BID  . multivitamin with minerals  1 tablet Oral Daily  . simvastatin  20 mg Oral q1800   Continuous Infusions: .  sodium chloride 20 mL/hr at 06/25/13 1611  . dextrose 5 % and 0.9 % NaCl with KCl 20 mEq/L 75 mL/hr at 06/26/13 1005   PRN Meds:.acetaminophen, acetaminophen, albuterol, albuterol, fentaNYL, guaifenesin, lidocaine, lidocaine, metoprolol, midazolam, morphine injection, ondansetron (ZOFRAN) IV, ondansetron, phenylephrine  DVT Prophylaxis   SCDs   Lab Results  Component Value Date   PLT 181 06/24/2013    Antibiotics     Anti-infectives   None          Subjective:   Austin [name] today has, No headache, No chest pain, No abdominal pain - No Nausea, No new  weakness tingling or numbness, No Cough - SOB.+ve dysphagia  Objective:   Filed Vitals:   06/26/13 1354 06/26/13 2251 06/27/13 0545 06/27/13 1034  BP: 108/69 131/77 92/65 102/60  Pulse: 81 87 68 72  Temp: 98.4 F (36.9 C) 98.3 F (36.8 C) 98.2 F (36.8 C)   TempSrc: Oral Oral Oral   Resp: 20 20 20    Height:      Weight:      SpO2: 98% 98% 97%     Wt Readings from Last 3 Encounters:  06/21/13 111.3 kg (245 lb 6 oz)  06/21/13 111.3 kg (245 lb 6 oz)  05/12/13 101.606 kg (224 lb)     Intake/Output Summary (Last 24 hours) at 06/27/13 1358 Last data filed at 06/27/13 0900  Gross per 24 hour  Intake 1833.75 ml  Output    650 ml  Net 1183.75 ml     Physical Exam  Awake Alert, Oriented X 3, No new F.N deficits, Normal affect Austin [name].AT,PERRAL Supple Neck,No JVD, No cervical lymphadenopathy appriciated.  Symmetrical Chest wall movement, Good air movement bilaterally, CTAB RRR,No Gallops,Rubs or new Murmurs, No Parasternal Heave +ve B.Sounds, Abd Soft, Non tender, No organomegaly appriciated, No rebound - guarding or rigidity. No Cyanosis, Clubbing or edema, No new Rash or bruise     Data Review   Micro Results Recent Results (from the past 240 hour(s))  CULTURE, BAL-QUANTITATIVE     Status: None   Collection Time    06/25/13  4:52 PM      Result Value Ref Range Status   Specimen Description BRONCHIAL ALVEOLAR LAVAGE   Final   Special Requests NONE   Final   Gram Stain     Final   Value: ABUNDANT WBC PRESENT, PREDOMINANTLY PMN     RARE SQUAMOUS EPITHELIAL CELLS PRESENT     FEW GRAM POSITIVE COCCI IN PAIRS AND CHAINS     Performed at [facility] Count PENDING   Incomplete   Culture     Final   Value: Culture reincubated for better growth     Performed at [facility]   Report Status PENDING   Incomplete  AFB CULTURE WITH SMEAR     Status: None   Collection Time    06/25/13  4:52 PM      Result Value Ref Range Status   Specimen Description  BRONCHIAL ALVEOLAR LAVAGE   Final   Special Requests NONE   Final   ACID FAST SMEAR     Final   Value: NO ACID FAST BACILLI SEEN     Performed at [facility]   Culture     Final   Value: CULTURE WILL BE EXAMINED FOR 6 WEEKS BEFORE ISSUING A FINAL REPORT     Performed at [facility]   Report Status PENDING   Incomplete  FUNGUS CULTURE  W SMEAR     Status: None   Collection Time    06/25/13  4:52 PM      Result Value Ref Range Status   Specimen Description BRONCHIAL ALVEOLAR LAVAGE   Final   Special Requests NONE   Final   Fungal Smear     Final   Value: NO YEAST OR FUNGAL ELEMENTS SEEN     Performed at [facility]   Culture     Final   Value: CULTURE IN PROGRESS FOR FOUR WEEKS     Performed at [facility]   Report Status PENDING   Incomplete    Radiology Reports Ct Angio Chest Pe W/cm &/or Wo Cm  06/22/2013   ADDENDUM REPORT: 06/22/2013 10:45  ADDENDUM: The patient has a large left hilar mass that extends into the mediastinum into the subcarinal region. Its medial portion is inseparable from the abnormal soft tissues at the junction of the mid and distal thirds of the esophagus. It is this portion of the mass that abuts the anterior aspect of the abdominal aorta. The maximal dimensions of the subcarinal mass are approximately 6.4 cm transversely x 3.2 cm AP x 3.6 cm longitudinally.  In the left axillary region there is a borderline enlarged lymph node measuring 1.4 cm in diameter.   Electronically Signed   By: David  Martinique   On: 06/22/2013 10:45   06/22/2013   CLINICAL DATA:  Hemoptysis, left lower extremity edema, recent hip surgery  EXAM: CT ANGIOGRAPHY CHEST WITH CONTRAST  TECHNIQUE: Multidetector CT imaging of the chest was performed using the standard protocol during bolus administration of intravenous contrast. Multiplanar CT image reconstructions and MIPs were obtained to evaluate the vascular anatomy.  CONTRAST:  11mL OMNIPAQUE IOHEXOL 350 MG/ML  SOLN  COMPARISON:  DG CHEST 1V PORT dated 05/12/2013  FINDINGS: The contrast bolus is limited in terms of density. No definite intraluminal pulmonary artery defects are demonstrated. There is an abnormal mediastinal and left hilar mass. This is producing significant mass effect upon the proximal left main pulmonary artery, but flow distally is still demonstrated. There is also mass effect upon the esophagus with distention of the proximal esophagus to the level of the carina. There is an anterior left hilar soft tissue mass 3.2 cm transversely by 4.9 cm AP. There is a 2 cm right hilar lymph node on image 48. Definite paratracheal lymphadenopathy is not demonstrated. The cardiac chambers are mildly enlarged. There is no pericardial effusion. The caliber of the thoracic aorta is normal. These soft tissue mass abuts the anterior and left lateral surface of the descending thoracic aorta.  There is a small left pleural effusion. Patchy areas of increased parenchymal density are present in the left upper lobe. Confluent increased density peripherally in the left lower lobe is present. On the right there is subsegmental atelectasis or early infiltrate in the lower lobe. There are emphysematous changes in both lungs.  Within the upper abdomen the observed portions of the liver and spleen appear normal. The adrenal glands are not included in the field of view. The thoracic vertebral bodies are preserved in height. The sternum appears intact. The observed portions of the ribs exhibit no acute abnormalities.  Review of the MIP images confirms the above findings.  IMPRESSION: 1. There is an abnormal mediastinal and left hilar mass producing mass effect upon the left main pulmonary artery. Classic pulmonary emboli are not demonstrated but vascular flow to the left lung is markedly impaired by the  large mass. This mass is also resulting in esophageal obstruction with the proximal and mid portions of the esophagus distended with  fluid and gas. The appearance is consistent with a central lung malignancy or malignancy of the distal esophagus. 2. There is a small left pleural effusion. 3. There is postobstructive atelectasis in the left lower lobe. Patchy areas of interstitial density in the left upper lobe are nonspecific but could reflect parenchymal metastatic disease. There is likely atelectasis or early infiltrate in the right lower lobe. 4. These results were called by telephone at the time of interpretation on 06/21/2013 at 6:00 PM to Dr. Tanna Furry , who verbally acknowledged these results.  Electronically Signed: By: David  Martinique On: 06/21/2013 18:02   Dg Esophagus  06/23/2013   CLINICAL DATA:  Dysphagia. Blockage seen on modified barium swallow.  EXAM: ESOPHOGRAM/BARIUM SWALLOW  TECHNIQUE: Single contrast examination was performed using  thin barium.  COMPARISON:  Chest CT 06/21/2013  FLUOROSCOPY TIME:  1 min, 35 seconds  FINDINGS: Prior to the patient ingesting additional barium, fluoroscopy demonstrates retained food/debris as well as barium from a earlier modified barium swallow study. The patient was able to take several swallows of thin barium. This mixes with the foods/ debris in the obstructed proximal to mid esophagus, with irregular tapering just below the aortic arch compatible with near complete obstructing esophageal mass. A small amount of contrast passes through this area in into the distal esophagus and stomach.  IMPRESSION: Near complete obstruction of the mid esophagus with irregular tapering of the esophagus at this level concerning for esophageal mass/ neoplasm.   Electronically Signed   By: Rolm Baptise M.D.   On: 06/23/2013 17:25   Dg Swallowing Func-speech Pathology  06/23/2013   Macario Golds, CCC-SLP     06/23/2013  4:27 PM Objective Swallowing Evaluation: Modified Barium Swallowing Study   Patient Details  Name: CANON GOLA MRN: [id number] Date of Birth: Dec 29, 1926  Today's Date: 06/23/2013 Time:  3086-5784 SLP Time Calculation (min): 52 min  Past Medical History:  Past Medical History  Diagnosis Date  . Hypertension   . Hyperlipidemia   . AAA (abdominal aortic aneurysm)   . Arthritis   . CAD (coronary artery disease)   . Thyroid disease     hypothyroidism  . Dysrhythmia     atrial flutter  . Shortness of breath     a little with exertion  . Polio 1934  . Atrial flutter     chronic anticoagulation  . COPD (chronic obstructive pulmonary disease)   . History of nuclear stress test 04/2010    dipyridamole; small, mostly fixed basal to mid inferior and  inferoseptal defect (gut artifact v. scar); post stress EF 62%;  non-diagnostic for ischemia; low risk    Past Surgical History:  Past Surgical History  Procedure Laterality Date  . Abdominal aortic aneurysm repair  05/21/2010    aorto bi-iliac [facility]  . Appendectomy  1949  . Popliteal synovial cyst excision Left   . Joint replacement      Knee and Hip replacements  . Total hip arthroplasty Left 1994  . Knee arthroplasty Left 2004  . Total hip revision Left 12/04/2012    Procedure: LEFT TOTAL HIP REVISION;  Surgeon: Mauri Pole,  MD;  Location: [facility];  Service: Orthopedics;  Laterality: Left;  . Cardiac catheterization  2009    in [place]; 50-60% RCA  . Transthoracic echocardiogram  04/2010    EF =>55%; RV borderline dilated;  LA mildly dilated; mild mitral  annular calcif & mild MR; mild TR; mild calcif of AV leaflets  with mild AV stenosis; aortic root sclerosis/calcif  . Orif femur fracture Left 01/22/2013    Procedure: OPEN REDUCTION INTERNAL FIXATION (ORIF) DISTAL FEMUR  FRACTURE;  Surgeon: Mauri Pole, MD;  Location: [facility];   Service: Orthopedics;  Laterality: Left;   HPI:  80 yo male adm to [facility] with hemopytsis.  Pt found to have  subcarinal mass with esophageal obstruction proximal and mid with  gas noted, Chest imaging showed post obstructive atelectasis LLL,  ? RLL infection/ ATX.  Pt reports 43 pound weight loss since Oct  2014 and aphonia x 2 1/2 months with  gradual onset and no  worsening.  Pt also with choking on food more than drink per his  report.  RN reports pt coughing with medicine.  BSE ordered.      Assessment / Plan / Recommendation Clinical Impression  Dysphagia Diagnosis: Mild oral phase dysphagia;Moderate  pharyngeal phase dysphagia;Suspected primary esophageal  dysphagia;Moderate cervical esophageal phase dysphagia   Clinical impression: Mild oral and moderate pharyngeal-cervical  esophageal dysphagia with suspected primary esophageal deficits.   Pt has sensorimotor deficits resulting in weak muscle contraction  and pharyngeal stasis without pt awareness.  Laryngeal elevation  was very poor negatively impacting airway protection and UES  opening.   Chin tuck posture along with dry swallows decreased  amount of pharyngeal stasis of liquids.  Head turn right with  chin tuck not effective.  Laryngeal penetration (silent) of small  amount of barium noted after swallow without pt awareness-  presumed from pyriform residuals spilling into open larynx.    Pt coughed during MBS without barium visualized in larynx -  suspect possible secretion aspiration occuring based on secretion  stasis in pharynx.    SLP only tested liquids due to appearance of stasis and narrowing  in mid-esophagus that did NOT clear with multiple swallows or  liquids. Diffiuclt situation due to multifactorial dysphagia as  pt with at risk of aspiration due to oropharyngeal weakness and  suspected esophageal component (appearance of narrowing with  retained secretions above - ? from extrinsic compression from  mass.  Please note radiologist not present during MBS to confirm  findings.      Recommend medicine be given IV and pt be allowed SMALL amounts of  tsps of thin water (? clears) with chin tuck and dry swallows for  comfort.    Rec dc intake if pt coughing or senses stasis *he did not have  sensation to poor clearance during entire test.  Pt given oral  suction and SLP demonstrated use  to help clear secretions from  pharynx if able to cough.  As pt reports h/o dysphagia and  coughing up secretions x3 weeks, suspect acute on chronic  dysphagia.       Treatment Recommendation  Therapy as outlined in treatment plan below    Diet Recommendation  (? single tsps clears with chin tuck - SMALL  SINGLE TSPS ONLY)   Liquid Administration via: Spoon Medication Administration: Via alternative means Compensations: Multiple dry swallows after each bite/sip;Hard  cough after swallow Postural Changes and/or Swallow Maneuvers: Seated upright 90  degrees;Upright 30-60 min after meal;Chin tuck    Other  Recommendations Recommended Consults: MBS Oral Care Recommendations: Oral care Q4 per protocol   Follow Up Recommendations   ? Referral to address possible masses causing extrinsic  obstruction on esophagus   Frequency  and Duration min 2x/week  2 weeks        General Date of Onset: 06/23/13 HPI: 80 yo male adm to [facility] with hemopytsis.  Pt found to have  subcarinal mass with esophageal obstruction proximal and mid with  gas noted, Chest imaging showed post obstructive atelectasis LLL,  ? RLL infection/ ATX.  Pt reports 43 pound weight loss since Oct  2014 and aphonia x 2 1/2 months with gradual onset and no  worsening.  Pt also with choking on food more than drink per his  report.  RN reports pt coughing with medicine.  BSE ordered.  Type of Study: Modified Barium Swallowing Study Reason for Referral: Objectively evaluate swallowing function Diet Prior to this Study: NPO Temperature Spikes Noted: No Respiratory Status: Nasal cannula History of Recent Intubation: No Behavior/Cognition: Alert;Cooperative;Pleasant mood Oral Cavity - Dentition: Missing dentition (few teeth) Oral Motor / Sensory Function: Impaired - see Bedside swallow  eval Self-Feeding Abilities: Able to feed self Patient Positioning: Upright in chair Baseline Vocal Quality: Low vocal intensity;Breathy Volitional Cough: Weak Volitional Swallow: Able to  elicit Pharyngeal Secretions: Standing secretions in (comment) (pharynx)     Reason for Referral Objectively evaluate swallowing function   Oral Phase Oral Preparation/Oral Phase Oral Phase: Impaired Oral - Nectar Oral - Nectar Teaspoon: Reduced posterior  propulsion;Lingual/palatal residue;Weak lingual manipulation Oral - Nectar Straw: Reduced posterior propulsion;Lingual/palatal  residue;Weak lingual manipulation Oral - Thin Oral - Thin Teaspoon: Reduced posterior  propulsion;Lingual/palatal residue;Weak lingual manipulation Oral - Thin Cup: Reduced posterior propulsion;Lingual/palatal  residue;Weak lingual manipulation Oral - Thin Straw: Reduced posterior propulsion;Lingual/palatal  residue;Weak lingual manipulation   Pharyngeal Phase Pharyngeal Phase Pharyngeal Phase: Impaired Pharyngeal - Nectar Pharyngeal - Nectar Teaspoon: Reduced epiglottic  inversion;Reduced pharyngeal peristalsis;Reduced anterior  laryngeal mobility;Reduced laryngeal elevation;Pharyngeal residue  - pyriform sinuses;Pharyngeal residue - valleculae;Trace  aspiration;Pharyngeal residue - cp segment Pharyngeal - Nectar Cup: Reduced epiglottic inversion;Reduced  pharyngeal peristalsis;Reduced anterior laryngeal  mobility;Reduced laryngeal elevation;Pharyngeal residue -  valleculae;Pharyngeal residue - pyriform sinuses;Reduced tongue  base retraction;Pharyngeal residue - cp  segment;Penetration/Aspiration after swallow Penetration/Aspiration details (nectar cup): Material enters  airway, remains ABOVE vocal cords and not ejected out Pharyngeal - Thin Pharyngeal - Thin Teaspoon: Reduced epiglottic inversion;Reduced  pharyngeal peristalsis;Reduced anterior laryngeal  mobility;Reduced laryngeal elevation;Pharyngeal residue -  valleculae;Pharyngeal residue - pyriform sinuses;Reduced tongue  base retraction;Pharyngeal residue - cp segment Pharyngeal - Thin Cup: Reduced epiglottic inversion;Reduced  pharyngeal peristalsis;Reduced anterior laryngeal   mobility;Reduced laryngeal elevation;Pharyngeal residue -  valleculae;Pharyngeal residue - pyriform sinuses;Reduced tongue  base retraction;Pharyngeal residue - cp segment Pharyngeal - Thin Straw: Reduced epiglottic inversion;Reduced  pharyngeal peristalsis;Reduced anterior laryngeal  mobility;Reduced laryngeal elevation;Pharyngeal residue -  valleculae;Pharyngeal residue - pyriform sinuses;Reduced tongue  base retraction;Pharyngeal residue - cp segment Pharyngeal Phase - Comment Pharyngeal Comment: chin tuck with liquids decreases amount of  pharyngeal accumulation, cued dry swallows - pt able to cough and  "hock" up mild amount of stasis x1, does not sense pharygneal  stasis  Cervical Esophageal Phase    GO    Cervical Esophageal Phase Cervical Esophageal Phase: Impaired Cervical Esophageal Phase - Nectar Nectar Teaspoon: Reduced cricopharyngeal relaxation Nectar Cup: Reduced cricopharyngeal relaxation Cervical Esophageal Phase - Thin Thin Teaspoon: Reduced cricopharyngeal relaxation Thin Cup: Reduced cricopharyngeal relaxation Thin Straw: Reduced cricopharyngeal relaxation         Luanna Salk, MS [facility] SLP [phone]     CBC  Recent Labs Lab 06/21/13 1640 06/24/13 0518  WBC 8.4 6.9  HGB 12.3* 11.1*  HCT  36.4* 32.9*  PLT 185 181  MCV 89.0 89.4  MCH 30.1 30.2  MCHC 33.8 33.7  RDW 16.8* 16.9*  LYMPHSABS 2.1  --   MONOABS 0.7  --   EOSABS 0.1  --   BASOSABS 0.0  --     Chemistries   Recent Labs Lab 06/21/13 1640 06/22/13 0440  NA 134* 138  K 4.1 3.8  CL 95* 99  CO2 26 27  GLUCOSE 107* 97  BUN 13 13  CREATININE 0.63 0.67  CALCIUM 10.0 9.9  AST  --  16  ALT  --  17  ALKPHOS  --  136*  BILITOT  --  0.7   ------------------------------------------------------------------------------------------------------------------ estimated creatinine clearance is 95.3 ml/min (by C-G formula based on Cr of  0.67). ------------------------------------------------------------------------------------------------------------------ No results found for this basename: HGBA1C,  in the last 72 hours ------------------------------------------------------------------------------------------------------------------ No results found for this basename: CHOL, HDL, LDLCALC, TRIG, CHOLHDL, LDLDIRECT,  in the last 72 hours ------------------------------------------------------------------------------------------------------------------ No results found for this basename: TSH, T4TOTAL, FREET3, T3FREE, THYROIDAB,  in the last 72 hours ------------------------------------------------------------------------------------------------------------------ No results found for this basename: VITAMINB12, FOLATE, FERRITIN, TIBC, IRON, RETICCTPCT,  in the last 72 hours  Coagulation profile  Recent Labs Lab 06/22/13 0440  INR 1.50*    No results found for this basename: DDIMER,  in the last 72 hours  Cardiac Enzymes No results found for this basename: CK, CKMB, TROPONINI, MYOGLOBIN,  in the last 168 hours ------------------------------------------------------------------------------------------------------------------ No components found with this basename: POCBNP,      Time Spent in minutes   35   Lala Lund K M.D on 06/27/2013 at 1:58 PM  Between 7am to 7pm - Pager - [phone]  After 7pm go to [email or web address] - password [email or web address]  And look for the night coverage person covering for me after hours  [facility] Office  [phone]

## 2013-06-27 NOTE — Progress Notes (Signed)
Bilateral lower extremity venous duplex:  No evidence of DVT, superficial thrombosis, or Baker's Cyst.

## 2013-06-27 NOTE — Progress Notes (Signed)
Physical Therapy Treatment Patient Details Name: Austin [name] MRN: [id number] DOB: [birth year] Today's Date: 06/27/2013 Time: 6712-4580 PT Time Calculation (min): 35 min  PT Assessment / Plan / Recommendation  History of Present Illness     PT Comments   Pt willing to participate and stood at bed side x 3 but unable to tolerate standing beyond 15 seconds 2* fatigue and R calf pain.  Pt maintained sitting balance at bedside between attempts without assist.  RN aware of new pain complaints  Follow Up Recommendations  Home health PT     Does the patient have the potential to tolerate intense rehabilitation     Barriers to Discharge        Equipment Recommendations  None recommended by PT    Recommendations for Other Services    Frequency Min 3X/week   Progress towards PT Goals Progress towards PT goals: Not progressing toward goals - comment (ltd by R calf pain with WB and fatigue)  Plan Current plan remains appropriate    Precautions / Restrictions Precautions Precautions: Fall;Other (comment) Restrictions Weight Bearing Restrictions: Yes LLE Weight Bearing: Partial weight bearing LLE Partial Weight Bearing Percentage or Pounds: 50%   Pertinent Vitals/Pain 8/10 R calf with WB    Mobility  Bed Mobility Overal bed mobility: +2 for physical assistance;Needs Assistance Bed Mobility: Supine to Sit;Sit to Supine Supine to sit: Mod assist;+2 for physical assistance Sit to supine: Mod assist;+2 for physical assistance General bed mobility comments: cues for use of R LE to self assist and physical assist to manage L LE and to bring trunk to upright Transfers Overall transfer level: Needs assistance Equipment used: Rolling walker (2 wheeled) Transfers: Sit to/from Stand Sit to Stand: +2 physical assistance;Mod assist General transfer comment: cues for LE management and use of UEs to self assist.  Pt stood x 3 but unable to tolerate standing beyond 15 secs 2* c/o R calf pain    Ambulation/Gait Ambulation/Gait assistance:  (Pt unable to sufficiently WB on UEs to advance either LE)    Exercises     PT Diagnosis:    PT Problem List:   PT Treatment Interventions:     PT Goals (current goals can now be found in the care plan section) Acute Rehab PT Goals Patient Stated Goal: Return home with dtr and continue at home rehab PT Goal Formulation: With patient Time For Goal Achievement: 07/07/13 Potential to Achieve Goals: Good  Visit Information  Last PT Received On: 06/27/13 Assistance Needed: +2    Subjective Data  Subjective: My right calf just kills me when I stand on it Patient Stated Goal: Return home with dtr and continue at home rehab   Cognition  Cognition Arousal/Alertness: Awake/alert Behavior During Therapy: WFL for tasks assessed/performed Overall Cognitive Status: Within Functional Limits for tasks assessed    Balance     End of Session PT - End of Session Equipment Utilized During Treatment: Gait belt Activity Tolerance: Patient limited by pain;Patient limited by fatigue Patient left: in bed;with call bell/phone within reach Nurse Communication: Mobility status   GP     Marquitta Persichetti 06/27/2013, 1:21 PM

## 2013-06-27 NOTE — Progress Notes (Signed)
Pathology pending.  Will continue to follow and re-evaluate once results returned.     Noe Gens, NP-C [facility] Pgr: [phone] or [phone]  Pathology pending.  Will continue to follow.  Patient informed that still pending.  Patient seen and examined, agree with above note.  I dictated the care and orders written for this patient under my direction.  Rush Farmer, MD [phone]

## 2013-06-27 NOTE — Progress Notes (Signed)
Speech Language Pathology Discharge Patient Details Name: Austin [name] MRN: [id number] DOB: 01/24/1927 Today's Date: 06/27/2013    Patient discharged from SLP services secondary to pt has determined to have comfort intake.  Luanna Salk, [facility] SLP [phone]

## 2013-06-28 DIAGNOSIS — Z515 Encounter for palliative care: Secondary | ICD-10-CM

## 2013-06-28 DIAGNOSIS — R1310 Dysphagia, unspecified: Secondary | ICD-10-CM

## 2013-06-28 NOTE — Consult Note (Signed)
Patient Austin [name]      DOB: April 19, 1927      YHC:[id number]     Consult Note from the Palliative Medicine Team at [facility] Requested by: Dr Candiss Norse     PCP: [name], MD Reason for Consultation: Clarification of [place] and options    Phone Number:[phone]  Assessment of patients Current state: Austin [name] is a 80 y.o. male,presents with complaints of hemoptysis, son and daughter at bedside, they give most of the history, patient has been coughing over the last 2 months, diagnosed with pneumonia, treated with Augmentin, had C. difficile is status post his treatment with by mouth vancomycin.   had persistent cough over a few weeks, with productive sputum, recently started to develop hemoptysis, minimal amount, mainly in a.m.,no hemoptysis here, patient had CT chest angiogram which did show mediastinal and left hilar mass, reports patient lost 40 pounds over last 5 months, is currently bedridden last few month after femur fracture,  patient reports dysphagia and cough to solids.  Lung mass, biopsy suggestive of malignacy  Chest CT  IMPRESSION:  1. There is an abnormal mediastinal and left hilar mass producing  mass effect upon the left main pulmonary artery. Classic pulmonary  emboli are not demonstrated but vascular flow to the left lung is  markedly impaired by the large mass. This mass is also resulting in  esophageal obstruction with the proximal and mid portions of the  esophagus distended with fluid and gas. The appearance is consistent  with a central lung malignancy or malignancy of the distal  esophagus.  2. There is a small left pleural effusion.  3. There is postobstructive atelectasis in the left lower lobe.  Patchy areas of interstitial density in the left upper lobe are  nonspecific but could reflect parenchymal metastatic disease. There  is likely atelectasis or early infiltrate in the right lower lobe.  Patient and family are faced with  advanced directive decisions and anticipatory care needs   Consult is for review of medical treatment options, clarification of goals of care and end of life issues, disposition and options, and symptom recommendation.  This NP Wadie Lessen reviewed medical records, received report from team, assessed the patient and then meet at the patient's bedside along with his son(Georger), two daughters (Theresa/Kathleen)and granddaughter(Jennifer)  to discuss diagnosis prognosis, GOC, EOL wishes disposition and options.   A detailed discussion was had today regarding advanced directives.  Concepts specific to code status, artifical feeding and hydration, continued IV antibiotics and rehospitalization was had.  The difference between a aggressive medical intervention path  and a palliative comfort care path for this patient at this time was had.  Values and goals of care important to patient and family were attempted to be elicited.  Concept of Hospice and Palliative Care were discussed  Natural trajectory and expectations at EOL were discussed.  Questions and concerns addressed.  Hard Choices booklet left for review. Family encouraged to call with questions or concerns.  PMT will continue to support holistically.   Goals of Care: 1.  Code Status:DNR/DNI   2. Scope of Treatment:  Overall goal is comfort quality and dignity.  Patient would like to be able to eat normally but understands the reality of his disease.  If the specialists strongly recommend radiation believing that it will greatly increase his ability to eat, without serious side effects the patient may consider treatment.   However if  not,  patient wants to go home  and  attempt sips as tolerated, treat symptoms as they arise, be among his family and let nature take its course.   We discussed in details strategies to enhance his comfort considering anticipatory care needs considering the disease process and his goals of care.  Discussed  the possibility of esophogeal stent placement only   4. Disposition: Ultimate goal is to get home understanding his limited prognosis.  Family understand the care needs and are open to hospice services   3. Symptom Management:   Dysphagia:  Sips as tolerated/ chew and spit/ aspiration precautions   4. Psychosocial:  Emotional support offered to family at bedside.  They are frustrated with perceived slow response to care.  Allowed time and active listening for support.   Allowed all to share happy family memories and express their sadness.   Patient Documents Completed or Given: Document Given Completed  Advanced Directives Pkt    MOST yes   DNR    Gone from My Sight    Hard Choices yes     Brief HPI: Austin [name] is a 80 y.o. male,presents with complaints of hemoptysis, son and daughter at bedside, they give most of the history, patient has been coughing over the last 2 months, diagnosed with pneumonia, treated with Augmentin, had C. difficile is status post his treatment with by mouth vancomycin before 2 weeks, any diarrhea currently, and had persistent cough over a few weeks, with productive sputum, recently started to develop hemoptysis, minimal amount, mainly in a.m.,no hemoptysis here, patient had CT chest angiogram which did show mediastinal and left hilar mass, reports patient lost 40 pounds over last 5 months, is currently bedridden last few month after femur fracture, it is on Xarelto, time took it yesterday at 6 PM, patient reports dysphagia and cough to solids.   ROS: difficulty swallowing, weakness, weight loss    PMH:  Past Medical History  Diagnosis Date  . Hypertension   . Hyperlipidemia   . AAA (abdominal aortic aneurysm)   . Arthritis   . CAD (coronary artery disease)   . Thyroid disease     hypothyroidism  . Dysrhythmia     atrial flutter  . Shortness of breath     a little with exertion  . Polio 1934  . Atrial flutter     chronic anticoagulation   . COPD (chronic obstructive pulmonary disease)   . History of nuclear stress test 04/2010    dipyridamole; small, mostly fixed basal to mid inferior and inferoseptal defect (gut artifact v. scar); post stress EF 62%; non-diagnostic for ischemia; low risk      PSH: Past Surgical History  Procedure Laterality Date  . Abdominal aortic aneurysm repair  05/21/2010    aorto bi-iliac [facility]  . Appendectomy  1949  . Popliteal synovial cyst excision Left   . Joint replacement      Knee and Hip replacements  . Total hip arthroplasty Left 1994  . Knee arthroplasty Left 2004  . Total hip revision Left 12/04/2012    Procedure: LEFT TOTAL HIP REVISION;  Surgeon: Mauri Pole, MD;  Location: [facility];  Service: Orthopedics;  Laterality: Left;  . Cardiac catheterization  2009    in [place]; 50-60% RCA  . Transthoracic echocardiogram  04/2010    EF =>55%; RV borderline dilated; LA mildly dilated; mild mitral annular calcif & mild MR; mild TR; mild calcif of AV leaflets with mild AV stenosis; aortic root sclerosis/calcif  . Orif femur fracture Left 01/22/2013  Procedure: OPEN REDUCTION INTERNAL FIXATION (ORIF) DISTAL FEMUR FRACTURE;  Surgeon: Mauri Pole, MD;  Location: [facility];  Service: Orthopedics;  Laterality: Left;  . Video bronchoscopy Bilateral 06/25/2013    Procedure: VIDEO BRONCHOSCOPY WITHOUT FLUORO;  Surgeon: Rush Farmer, MD;  Location: [facility];  Service: Cardiopulmonary;  Laterality: Bilateral;   I have reviewed the [place] and SH and  If appropriate update it with new information. No Known Allergies Scheduled Meds: . cholecalciferol  1,000 Units Oral Daily  . digoxin  0.125 mg Intravenous Daily  . feeding supplement (ENSURE COMPLETE)  237 mL Oral BID BM  . levothyroxine  37.5 mcg Intravenous Daily  . metoprolol tartrate  25 mg Oral BID  . multivitamin with minerals  1 tablet Oral Daily  . simvastatin  20 mg Oral q1800   Continuous Infusions: . sodium chloride 20 mL/hr at 06/25/13 1611   . dextrose 5 % and 0.9 % NaCl with KCl 20 mEq/L 75 mL/hr at 06/26/13 1005   PRN Meds:.acetaminophen, acetaminophen, albuterol, albuterol, fentaNYL, guaifenesin, lidocaine, lidocaine, metoprolol, midazolam, morphine injection, ondansetron (ZOFRAN) IV, ondansetron, phenylephrine    BP 135/78  Pulse 78  Temp(Src) 98.2 F (36.8 C) (Oral)  Resp 20  Ht 6\' 5"  (1.956 m)  Wt 111.3 kg (245 lb 6 oz)  BMI 29.09 kg/m2  SpO2 90%   PPS: 30%   Intake/Output Summary (Last 24 hours) at 06/28/13 1407 Last data filed at 06/28/13 0917  Gross per 24 hour  Intake   1920 ml  Output   1275 ml  Net    645 ml    Physical Exam:  General:  awakens easily to name, HEENT:  Mm, no exudate Chest:  CTA CVS: RRR Abdomen:soft NT +BS Ext: without edeam Neuro:oriented X3   Labs: CBC    Component Value Date/Time   WBC 6.9 06/24/2013 0518   RBC 3.68* 06/24/2013 0518   HGB 11.1* 06/24/2013 0518   HCT 32.9* 06/24/2013 0518   PLT 181 06/24/2013 0518   MCV 89.4 06/24/2013 0518   MCH 30.2 06/24/2013 0518   MCHC 33.7 06/24/2013 0518   RDW 16.9* 06/24/2013 0518   LYMPHSABS 2.1 06/21/2013 1640   MONOABS 0.7 06/21/2013 1640   EOSABS 0.1 06/21/2013 1640   BASOSABS 0.0 06/21/2013 1640    BMET    Component Value Date/Time   NA 138 06/22/2013 0440   K 3.8 06/22/2013 0440   CL 99 06/22/2013 0440   CO2 27 06/22/2013 0440   GLUCOSE 97 06/22/2013 0440   BUN 13 06/22/2013 0440   CREATININE 0.67 06/22/2013 0440   CALCIUM 9.9 06/22/2013 0440   GFRNONAA 86* 06/22/2013 0440   GFRAA >90 06/22/2013 0440    CMP     Component Value Date/Time   NA 138 06/22/2013 0440   K 3.8 06/22/2013 0440   CL 99 06/22/2013 0440   CO2 27 06/22/2013 0440   GLUCOSE 97 06/22/2013 0440   BUN 13 06/22/2013 0440   CREATININE 0.67 06/22/2013 0440   CALCIUM 9.9 06/22/2013 0440   PROT 6.2 06/22/2013 0440   ALBUMIN 2.6* 06/22/2013 0440   AST 16 06/22/2013 0440   ALT 17 06/22/2013 0440   ALKPHOS 136* 06/22/2013 0440   BILITOT 0.7 06/22/2013 0440   GFRNONAA 86* 06/22/2013 0440   GFRAA >90  06/22/2013 0440    Time In Time Out Total Time Spent with Patient Total Overall Time  1400 1600 105 min 120 min    Greater than 50%  of  this time was spent counseling and coordinating care related to the above assessment and plan.   Discussed with Dr  Wadie Lessen NP  Palliative Medicine Team Team Phone # [phone] Pager [phone]  Discussed with Dr Candiss Norse and Dr Nelda Marseille

## 2013-06-28 NOTE — Progress Notes (Signed)
  Name: Austin [name] MRN: [id number] DOB: May 30, 1926    ADMISSION DATE:  06/21/2013 CONSULTATION DATE:  06/21/2013  REFERRING MD :  Dr Daleen Bo PRIMARY SERVICE:  TRH  CHIEF COMPLAINT:  Cough  BRIEF PATIENT DESCRIPTION: 80 year old male with multiple medical problems presents 30 for progressive cough/hemoptysis. CTA chest discovered subcarinal mass. PCCM asked to see.  SIGNIFICANT EVENTS / STUDIES:  3/5 CTA Chest - subcarinal mass 6.4 cmx 3.2 cm AP x 3.6 cm. left axillary region borderline enlarged lymph node measuring 1.4 cm in diameter.  LINES / TUBES: PIV  CULTURES: None  Path with NSCLC from wang needle.  ANTIBIOTICS: None   SUBJECTIVE: No events overnight, no new complaints.  VITAL SIGNS: Temp:  [97.9 F (36.6 C)-98.7 F (37.1 C)] 98.7 F (37.1 C) (03/12 1525) Pulse Rate:  [62-78] 64 (03/12 1525) Resp:  [19-20] 19 (03/12 1525) BP: (120-135)/(70-78) 126/78 mmHg (03/12 1525) SpO2:  [90 %-94 %] 90 % (03/12 1525)  PHYSICAL EXAMINATION: General:  Elderly male in NAD Neuro:  Alert, oriented, answers questions appropriately. Lethargic. HEENT:  [place]/AT Neck:  Supple no JVD Cardiovascular:  RRR Lungs:  Bibasilar rhonchi, L > R Abdomen:  Soft, non-tender Musculoskeletal:  Intact, no acute deformity.  Skin:  Intact  Recent Labs Lab 06/22/13 0440  NA 138  K 3.8  CL 99  CO2 27  BUN 13  CREATININE 0.67  GLUCOSE 97    Recent Labs Lab 06/24/13 0518  HGB 11.1*  HCT 32.9*  WBC 6.9  PLT 181   No results found. ASSESSMENT / PLAN:  Subcarinal Lung Mass 6.4 cm  x 3.2 cm AP x 3.6 cm, NSCLC on path. Hemoptysis  - No recommendations for hemoptysis for now. - Spoke with patient and family extensively and reviewed radiologic findings.  The patient's wishes were clearly questioned and very clearly said he would like the ability to eat then go home and die comfortable.  That translated into radiation for symptom relief then to go home with hospice.  This was discussed  with palliative care and the children who promised to uphold their father's wishes. - PCCM will sign off, please call back if needed.   Rush Farmer, M.D. [facility]. Pager: [phone]. After hours pager: [phone].

## 2013-06-28 NOTE — Progress Notes (Addendum)
Patient Demographics  Austin [name], is a 80 y.o. male, DOB - [birth year], ZES:[id number]  Admit date - 06/21/2013   Admitting Physician Austin Climes, MD  Outpatient Primary MD for the patient is Austin Sake, MD  LOS - 7   Chief Complaint  Patient presents with  . Hematemesis        Assessment & Plan    80 y/o male with PMH of HTN, COPD, HPL, A fib, CVA, recent sepsis, recurrent pneumonia, c diff presented with hemoptysis found to have lung, esophageal mass.      1. Hemoptysis/Lung mass likely lung CA (+tobacco use); likely lung CA with mets to esophagus - CT: abnormal mediastinal and left hilar mass producing mass effect upon the left main pulmonary artery mass is also resulting in esophageal obstruction with the proximal and mid portions of the esophagus The appearance is consistent with a central lung malignancy or malignancy of the distal esophagus. Old xaralto for chronic anticoagulation for atrial fibrillation , eating currently stable.  3/9: s/p bronchoscopy and biopsy shows changes consistent with non-small cell lung cancer, I have called oncology for evaluation and opinion, patient open for palliative radiation treatments, also palliative care has been consulted. Prognosis is poor.  - cultures +GPC, but afebrile; no leukocytosis; competed atx last month; monitor off abx;        2. Dysphagia due esophageal mass lesion: failed swallow evaluation; at risk for aspiration, recent h/p aspiration PNA  - esophagogram: Near complete obstruction of the mid esophagus with irregular tapering of the esophagus atthis level concerning for esophageal mass/ neoplasm.  - per GI hold EGD; ? Need stenting (if done will need tracheal stenting as well) - d/w patient, family they want palliative  feeding, understanding the risk of aspiration;      3. Atrial fibrillation: rate Controlled, continue with metoprolol and digoxin, hold anticoagulation it due to to #1     4. Hypertension: Acceptable continue with metoprolol     5. Hyperlipedemia: cont with statin     6. COPD; h/o tobacco use; no wheezing on exam; cont bronchodilators as needed     7. History of CVA holding Xarelto due to hemoptysis; continue statin      8. History of recent left hip repair  - per notes: Dr. Alvan [name] on 1/28-okay for 50% weightbearing on the left lower extremity. Patient to followup with Dr. Alvan [name] in in one month for repeat x-rays, if continues to do well with good healing, patient will likely be advanced to more weightbearing status  -xray: 1/28: Healing fracture involving the distal shaft of the left femur status post sideplate and screw device reduction     Prognosis is guarded,  -likely will need hospice care, palliative care consulted       Code Status: DNR  Family Communication:    Disposition Plan:  TBD   Procedures     Consults   PCCM, Pall care, Oncology, GI, CCS   Medications  Scheduled Meds: . cholecalciferol  1,000 Units Oral Daily  . digoxin  0.125 mg Intravenous Daily  . feeding supplement (ENSURE COMPLETE)  237 mL Oral BID BM  . levothyroxine  37.5 mcg Intravenous Daily  . metoprolol tartrate  25 mg Oral BID  .  multivitamin with minerals  1 tablet Oral Daily  . simvastatin  20 mg Oral q1800   Continuous Infusions: . sodium chloride 20 mL/hr at 06/25/13 1611  . dextrose 5 % and 0.9 % NaCl with KCl 20 mEq/L 75 mL/hr at 06/26/13 1005   PRN Meds:.acetaminophen, acetaminophen, albuterol, albuterol, fentaNYL, guaifenesin, lidocaine, lidocaine, metoprolol, midazolam, morphine injection, ondansetron (ZOFRAN) IV, ondansetron, phenylephrine  DVT Prophylaxis   SCDs   Lab Results  Component Value Date   PLT 181 06/24/2013    Antibiotics     Anti-infectives    None          Subjective:   Austin [name] today has, No headache, No chest pain, No abdominal pain - No Nausea, No new weakness tingling or numbness, No Cough - SOB.+ve dysphagia  Objective:   Filed Vitals:   06/27/13 1034 06/27/13 1423 06/27/13 2155 06/28/13 0446  BP: 102/60 110/66 120/70 135/78  Pulse: 72 64 62 78  Temp:  97.7 F (36.5 C) 97.9 F (36.6 C) 98.2 F (36.8 C)  TempSrc:  Oral Oral Oral  Resp:   20 20  Height:      Weight:      SpO2:  94% 94% 90%    Wt Readings from Last 3 Encounters:  06/21/13 111.3 kg (245 lb 6 oz)  06/21/13 111.3 kg (245 lb 6 oz)  05/12/13 101.606 kg (224 lb)     Intake/Output Summary (Last 24 hours) at 06/28/13 1253 Last data filed at 06/28/13 0917  Gross per 24 hour  Intake   1920 ml  Output   1275 ml  Net    645 ml     Physical Exam  Awake Alert, Oriented X 3, No new F.N deficits, Normal affect Austin [name],PERRAL Supple Neck,No JVD, No cervical lymphadenopathy appriciated.  Symmetrical Chest wall movement, Good air movement bilaterally, CTAB RRR,No Gallops,Rubs or new Murmurs, No Parasternal Heave +ve B.Sounds, Abd Soft, Non tender, No organomegaly appriciated, No rebound - guarding or rigidity. No Cyanosis, Clubbing or edema, No new Rash or bruise     Data Review   Micro Results Recent Results (from the past 240 hour(s))  CULTURE, BAL-QUANTITATIVE     Status: None   Collection Time    06/25/13  4:52 PM      Result Value Ref Range Status   Specimen Description BRONCHIAL ALVEOLAR LAVAGE   Final   Special Requests NONE   Final   Gram Stain     Final   Value: ABUNDANT WBC PRESENT, PREDOMINANTLY PMN     RARE SQUAMOUS EPITHELIAL CELLS PRESENT     FEW GRAM POSITIVE COCCI IN PAIRS AND CHAINS     Performed at [facility]     Final   Value: 80,000 COLONIES/ML     Performed at [facility]   Culture     Final   Value: STAPHYLOCOCCUS AUREUS     Note: RIFAMPIN AND GENTAMICIN SHOULD NOT BE  USED AS SINGLE DRUGS FOR TREATMENT OF STAPH INFECTIONS.     Performed at [facility]   Report Status PENDING   Incomplete  AFB CULTURE WITH SMEAR     Status: None   Collection Time    06/25/13  4:52 PM      Result Value Ref Range Status   Specimen Description BRONCHIAL ALVEOLAR LAVAGE   Final   Special Requests NONE   Final   ACID FAST SMEAR     Final   Value:  NO ACID FAST BACILLI SEEN     Performed at [facility]   Culture     Final   Value: CULTURE WILL BE EXAMINED FOR 6 WEEKS BEFORE ISSUING A FINAL REPORT     Performed at [facility]   Report Status PENDING   Incomplete  FUNGUS CULTURE W SMEAR     Status: None   Collection Time    06/25/13  4:52 PM      Result Value Ref Range Status   Specimen Description BRONCHIAL ALVEOLAR LAVAGE   Final   Special Requests NONE   Final   Fungal Smear     Final   Value: NO YEAST OR FUNGAL ELEMENTS SEEN     Performed at [facility]   Culture     Final   Value: CULTURE IN PROGRESS FOR FOUR WEEKS     Performed at [facility]   Report Status PENDING   Incomplete    Radiology Reports Ct Angio Chest Pe W/cm &/or Wo Cm  06/22/2013   ADDENDUM REPORT: 06/22/2013 10:45  ADDENDUM: The patient has a large left hilar mass that extends into the mediastinum into the subcarinal region. Its medial portion is inseparable from the abnormal soft tissues at the junction of the mid and distal thirds of the esophagus. It is this portion of the mass that abuts the anterior aspect of the abdominal aorta. The maximal dimensions of the subcarinal mass are approximately 6.4 cm transversely x 3.2 cm AP x 3.6 cm longitudinally.  In the left axillary region there is a borderline enlarged lymph node measuring 1.4 cm in diameter.   Electronically Signed   By: David  Martinique   On: 06/22/2013 10:45   06/22/2013   CLINICAL DATA:  Hemoptysis, left lower extremity edema, recent hip surgery  EXAM: CT ANGIOGRAPHY CHEST WITH CONTRAST  TECHNIQUE:  Multidetector CT imaging of the chest was performed using the standard protocol during bolus administration of intravenous contrast. Multiplanar CT image reconstructions and MIPs were obtained to evaluate the vascular anatomy.  CONTRAST:  137mL OMNIPAQUE IOHEXOL 350 MG/ML SOLN  COMPARISON:  DG CHEST 1V PORT dated 05/12/2013  FINDINGS: The contrast bolus is limited in terms of density. No definite intraluminal pulmonary artery defects are demonstrated. There is an abnormal mediastinal and left hilar mass. This is producing significant mass effect upon the proximal left main pulmonary artery, but flow distally is still demonstrated. There is also mass effect upon the esophagus with distention of the proximal esophagus to the level of the carina. There is an anterior left hilar soft tissue mass 3.2 cm transversely by 4.9 cm AP. There is a 2 cm right hilar lymph node on image 48. Definite paratracheal lymphadenopathy is not demonstrated. The cardiac chambers are mildly enlarged. There is no pericardial effusion. The caliber of the thoracic aorta is normal. These soft tissue mass abuts the anterior and left lateral surface of the descending thoracic aorta.  There is a small left pleural effusion. Patchy areas of increased parenchymal density are present in the left upper lobe. Confluent increased density peripherally in the left lower lobe is present. On the right there is subsegmental atelectasis or early infiltrate in the lower lobe. There are emphysematous changes in both lungs.  Within the upper abdomen the observed portions of the liver and spleen appear normal. The adrenal glands are not included in the field of view. The thoracic vertebral bodies are preserved in height. The sternum appears intact. The observed  portions of the ribs exhibit no acute abnormalities.  Review of the MIP images confirms the above findings.  IMPRESSION: 1. There is an abnormal mediastinal and left hilar mass producing mass effect upon the  left main pulmonary artery. Classic pulmonary emboli are not demonstrated but vascular flow to the left lung is markedly impaired by the large mass. This mass is also resulting in esophageal obstruction with the proximal and mid portions of the esophagus distended with fluid and gas. The appearance is consistent with a central lung malignancy or malignancy of the distal esophagus. 2. There is a small left pleural effusion. 3. There is postobstructive atelectasis in the left lower lobe. Patchy areas of interstitial density in the left upper lobe are nonspecific but could reflect parenchymal metastatic disease. There is likely atelectasis or early infiltrate in the right lower lobe. 4. These results were called by telephone at the time of interpretation on 06/21/2013 at 6:00 PM to Dr. Tanna Furry , who verbally acknowledged these results.  Electronically Signed: By: David  Martinique On: 06/21/2013 18:02   Dg Esophagus  06/23/2013   CLINICAL DATA:  Dysphagia. Blockage seen on modified barium swallow.  EXAM: ESOPHOGRAM/BARIUM SWALLOW  TECHNIQUE: Single contrast examination was performed using  thin barium.  COMPARISON:  Chest CT 06/21/2013  FLUOROSCOPY TIME:  1 min, 35 seconds  FINDINGS: Prior to the patient ingesting additional barium, fluoroscopy demonstrates retained food/debris as well as barium from a earlier modified barium swallow study. The patient was able to take several swallows of thin barium. This mixes with the foods/ debris in the obstructed proximal to mid esophagus, with irregular tapering just below the aortic arch compatible with near complete obstructing esophageal mass. A small amount of contrast passes through this area in into the distal esophagus and stomach.  IMPRESSION: Near complete obstruction of the mid esophagus with irregular tapering of the esophagus at this level concerning for esophageal mass/ neoplasm.   Electronically Signed   By: Rolm Baptise M.D.   On: 06/23/2013 17:25   Dg Swallowing  Func-speech Pathology  06/23/2013   Macario Golds, CCC-SLP     06/23/2013  4:27 PM Objective Swallowing Evaluation: Modified Barium Swallowing Study   Patient Details  Name: Austin [name] MRN: [id number] Date of Birth: 08/15/1926  Today's Date: 06/23/2013 Time: 0272-5366 SLP Time Calculation (min): 52 min  Past Medical History:  Past Medical History  Diagnosis Date  . Hypertension   . Hyperlipidemia   . AAA (abdominal aortic aneurysm)   . Arthritis   . CAD (coronary artery disease)   . Thyroid disease     hypothyroidism  . Dysrhythmia     atrial flutter  . Shortness of breath     a little with exertion  . Polio 1934  . Atrial flutter     chronic anticoagulation  . COPD (chronic obstructive pulmonary disease)   . History of nuclear stress test 04/2010    dipyridamole; small, mostly fixed basal to mid inferior and  inferoseptal defect (gut artifact v. scar); post stress EF 62%;  non-diagnostic for ischemia; low risk    Past Surgical History:  Past Surgical History  Procedure Laterality Date  . Abdominal aortic aneurysm repair  05/21/2010    aorto bi-iliac BPG  . Appendectomy  1949  . Popliteal synovial cyst excision Left   . Joint replacement      Knee and Hip replacements  . Total hip arthroplasty Left 1994  . Knee arthroplasty Left 2004  .  Total hip revision Left 12/04/2012    Procedure: LEFT TOTAL HIP REVISION;  Surgeon: Mauri Pole,  MD;  Location: [facility];  Service: Orthopedics;  Laterality: Left;  . Cardiac catheterization  2009    in [place]; 50-60% RCA  . Transthoracic echocardiogram  04/2010    EF =>55%; RV borderline dilated; LA mildly dilated; mild mitral  annular calcif & mild MR; mild TR; mild calcif of AV leaflets  with mild AV stenosis; aortic root sclerosis/calcif  . Orif femur fracture Left 01/22/2013    Procedure: OPEN REDUCTION INTERNAL FIXATION (ORIF) DISTAL FEMUR  FRACTURE;  Surgeon: Mauri Pole, MD;  Location: [place];   Service: Orthopedics;  Laterality: Left;   HPI:  80 yo male adm to [facility] with  hemopytsis.  Pt found to have  subcarinal mass with esophageal obstruction proximal and mid with  gas noted, Chest imaging showed post obstructive atelectasis LLL,  ? RLL infection/ ATX.  Pt reports 43 pound weight loss since Oct  2014 and aphonia x 2 1/2 months with gradual onset and no  worsening.  Pt also with choking on food more than drink per his  report.  RN reports pt coughing with medicine.  BSE ordered.      Assessment / Plan / Recommendation Clinical Impression  Dysphagia Diagnosis: Mild oral phase dysphagia;Moderate  pharyngeal phase dysphagia;Suspected primary esophageal  dysphagia;Moderate cervical esophageal phase dysphagia   Clinical impression: Mild oral and moderate pharyngeal-cervical  esophageal dysphagia with suspected primary esophageal deficits.   Pt has sensorimotor deficits resulting in weak muscle contraction  and pharyngeal stasis without pt awareness.  Laryngeal elevation  was very poor negatively impacting airway protection and UES  opening.   Chin tuck posture along with dry swallows decreased  amount of pharyngeal stasis of liquids.  Head turn right with  chin tuck not effective.  Laryngeal penetration (silent) of small  amount of barium noted after swallow without pt awareness-  presumed from pyriform residuals spilling into open larynx.    Pt coughed during MBS without barium visualized in larynx -  suspect possible secretion aspiration occuring based on secretion  stasis in pharynx.    SLP only tested liquids due to appearance of stasis and narrowing  in mid-esophagus that did NOT clear with multiple swallows or  liquids. Diffiuclt situation due to multifactorial dysphagia as  pt with at risk of aspiration due to oropharyngeal weakness and  suspected esophageal component (appearance of narrowing with  retained secretions above - ? from extrinsic compression from  mass.  Please note radiologist not present during MBS to confirm  findings.      Recommend medicine be given IV and pt be  allowed SMALL amounts of  tsps of thin water (? clears) with chin tuck and dry swallows for  comfort.    Rec dc intake if pt coughing or senses stasis *he did not have  sensation to poor clearance during entire test.  Pt given oral  suction and SLP demonstrated use to help clear secretions from  pharynx if able to cough.  As pt reports h/o dysphagia and  coughing up secretions x3 weeks, suspect acute on chronic  dysphagia.       Treatment Recommendation  Therapy as outlined in treatment plan below    Diet Recommendation  (? single tsps clears with chin tuck - SMALL  SINGLE TSPS ONLY)   Liquid Administration via: Spoon Medication Administration: Via alternative means Compensations: Multiple dry swallows after each bite/sip;Hard  cough after swallow Postural Changes and/or Swallow Maneuvers: Seated upright 90  degrees;Upright 30-60 min after meal;Chin tuck    Other  Recommendations Recommended Consults: MBS Oral Care Recommendations: Oral care Q4 per protocol   Follow Up Recommendations   ? Referral to address possible masses causing extrinsic  obstruction on esophagus   Frequency and Duration min 2x/week  2 weeks        General Date of Onset: 06/23/13 HPI: 80 yo male adm to [facility] with hemopytsis.  Pt found to have  subcarinal mass with esophageal obstruction proximal and mid with  gas noted, Chest imaging showed post obstructive atelectasis LLL,  ? RLL infection/ ATX.  Pt reports 43 pound weight loss since Oct  2014 and aphonia x 2 1/2 months with gradual onset and no  worsening.  Pt also with choking on food more than drink per his  report.  RN reports pt coughing with medicine.  BSE ordered.  Type of Study: Modified Barium Swallowing Study Reason for Referral: Objectively evaluate swallowing function Diet Prior to this Study: NPO Temperature Spikes Noted: No Respiratory Status: Nasal cannula History of Recent Intubation: No Behavior/Cognition: Alert;Cooperative;Pleasant mood Oral Cavity - Dentition: Missing dentition  (few teeth) Oral Motor / Sensory Function: Impaired - see Bedside swallow  eval Self-Feeding Abilities: Able to feed self Patient Positioning: Upright in chair Baseline Vocal Quality: Low vocal intensity;Breathy Volitional Cough: Weak Volitional Swallow: Able to elicit Pharyngeal Secretions: Standing secretions in (comment) (pharynx)     Reason for Referral Objectively evaluate swallowing function   Oral Phase Oral Preparation/Oral Phase Oral Phase: Impaired Oral - Nectar Oral - Nectar Teaspoon: Reduced posterior  propulsion;Lingual/palatal residue;Weak lingual manipulation Oral - Nectar Straw: Reduced posterior propulsion;Lingual/palatal  residue;Weak lingual manipulation Oral - Thin Oral - Thin Teaspoon: Reduced posterior  propulsion;Lingual/palatal residue;Weak lingual manipulation Oral - Thin Cup: Reduced posterior propulsion;Lingual/palatal  residue;Weak lingual manipulation Oral - Thin Straw: Reduced posterior propulsion;Lingual/palatal  residue;Weak lingual manipulation   Pharyngeal Phase Pharyngeal Phase Pharyngeal Phase: Impaired Pharyngeal - Nectar Pharyngeal - Nectar Teaspoon: Reduced epiglottic  inversion;Reduced pharyngeal peristalsis;Reduced anterior  laryngeal mobility;Reduced laryngeal elevation;Pharyngeal residue  - pyriform sinuses;Pharyngeal residue - valleculae;Trace  aspiration;Pharyngeal residue - cp segment Pharyngeal - Nectar Cup: Reduced epiglottic inversion;Reduced  pharyngeal peristalsis;Reduced anterior laryngeal  mobility;Reduced laryngeal elevation;Pharyngeal residue -  valleculae;Pharyngeal residue - pyriform sinuses;Reduced tongue  base retraction;Pharyngeal residue - cp  segment;Penetration/Aspiration after swallow Penetration/Aspiration details (nectar cup): Material enters  airway, remains ABOVE vocal cords and not ejected out Pharyngeal - Thin Pharyngeal - Thin Teaspoon: Reduced epiglottic inversion;Reduced  pharyngeal peristalsis;Reduced anterior laryngeal  mobility;Reduced  laryngeal elevation;Pharyngeal residue -  valleculae;Pharyngeal residue - pyriform sinuses;Reduced tongue  base retraction;Pharyngeal residue - cp segment Pharyngeal - Thin Cup: Reduced epiglottic inversion;Reduced  pharyngeal peristalsis;Reduced anterior laryngeal  mobility;Reduced laryngeal elevation;Pharyngeal residue -  valleculae;Pharyngeal residue - pyriform sinuses;Reduced tongue  base retraction;Pharyngeal residue - cp segment Pharyngeal - Thin Straw: Reduced epiglottic inversion;Reduced  pharyngeal peristalsis;Reduced anterior laryngeal  mobility;Reduced laryngeal elevation;Pharyngeal residue -  valleculae;Pharyngeal residue - pyriform sinuses;Reduced tongue  base retraction;Pharyngeal residue - cp segment Pharyngeal Phase - Comment Pharyngeal Comment: chin tuck with liquids decreases amount of  pharyngeal accumulation, cued dry swallows - pt able to cough and  "hock" up mild amount of stasis x1, does not sense pharygneal  stasis  Cervical Esophageal Phase    GO    Cervical Esophageal Phase Cervical Esophageal Phase: Impaired Cervical Esophageal Phase - Nectar Nectar Teaspoon: Reduced cricopharyngeal relaxation Nectar Cup: Reduced cricopharyngeal relaxation  Cervical Esophageal Phase - Thin Thin Teaspoon: Reduced cricopharyngeal relaxation Thin Cup: Reduced cricopharyngeal relaxation Thin Straw: Reduced cricopharyngeal relaxation         Luanna Salk, MS [facility] SLP [phone]     CBC  Recent Labs Lab 06/21/13 1640 06/24/13 0518  WBC 8.4 6.9  HGB 12.3* 11.1*  HCT 36.4* 32.9*  PLT 185 181  MCV 89.0 89.4  MCH 30.1 30.2  MCHC 33.8 33.7  RDW 16.8* 16.9*  LYMPHSABS 2.1  --   MONOABS 0.7  --   EOSABS 0.1  --   BASOSABS 0.0  --     Chemistries   Recent Labs Lab 06/21/13 1640 06/22/13 0440  NA 134* 138  K 4.1 3.8  CL 95* 99  CO2 26 27  GLUCOSE 107* 97  BUN 13 13  CREATININE 0.63 0.67  CALCIUM 10.0 9.9  AST  --  16  ALT  --  17  ALKPHOS  --  136*  BILITOT  --  0.7    ------------------------------------------------------------------------------------------------------------------ estimated creatinine clearance is 95.3 ml/min (by C-G formula based on Cr of 0.67). ------------------------------------------------------------------------------------------------------------------ No results found for this basename: HGBA1C,  in the last 72 hours ------------------------------------------------------------------------------------------------------------------ No results found for this basename: CHOL, HDL, LDLCALC, TRIG, CHOLHDL, LDLDIRECT,  in the last 72 hours ------------------------------------------------------------------------------------------------------------------ No results found for this basename: TSH, T4TOTAL, FREET3, T3FREE, THYROIDAB,  in the last 72 hours ------------------------------------------------------------------------------------------------------------------ No results found for this basename: VITAMINB12, FOLATE, FERRITIN, TIBC, IRON, RETICCTPCT,  in the last 72 hours  Coagulation profile  Recent Labs Lab 06/22/13 0440  INR 1.50*    No results found for this basename: DDIMER,  in the last 72 hours  Cardiac Enzymes No results found for this basename: CK, CKMB, TROPONINI, MYOGLOBIN,  in the last 168 hours ------------------------------------------------------------------------------------------------------------------ No components found with this basename: POCBNP,      Time Spent in minutes   35   Lala Lund K M.D on 06/28/2013 at 12:53 PM  Between 7am to 7pm - Pager - [phone]  After 7pm go to [email or web address] - password [email or web address]  And look for the night coverage person covering for me after hours  [facility] Office  [phone]

## 2013-06-29 ENCOUNTER — Ambulatory Visit
Admit: 2013-06-29 | Discharge: 2013-06-29 | Disposition: A | Discharge status: Home / Self Care | Payer: Medicare Other | Attending: Radiation Oncology | Admitting: Radiation Oncology

## 2013-06-29 DIAGNOSIS — C3400 Malignant neoplasm of unspecified main bronchus: Secondary | ICD-10-CM | POA: Insufficient documentation

## 2013-06-29 DIAGNOSIS — C3490 Malignant neoplasm of unspecified part of unspecified bronchus or lung: Principal | ICD-10-CM

## 2013-06-29 DIAGNOSIS — E43 Unspecified severe protein-calorie malnutrition: Secondary | ICD-10-CM

## 2013-06-29 LAB — CULTURE, BAL-QUANTITATIVE W GRAM STAIN: Colony Count: 80000

## 2013-06-29 LAB — CULTURE, BAL-QUANTITATIVE

## 2013-06-29 MED ORDER — LORAZEPAM 0.5 MG PO TABS: 0.5000 mg | ORAL_TABLET | Freq: Three times a day (TID) | ORAL | Status: DC

## 2013-06-29 MED ORDER — DOXYCYCLINE HYCLATE 100 MG PO TABS
100.0000 mg | ORAL_TABLET | Freq: Two times a day (BID) | ORAL | Status: DC
  Filled 2013-06-29 (×3): qty 1

## 2013-06-29 MED ORDER — MORPHINE SULFATE (CONCENTRATE) 10 MG /0.5 ML PO SOLN: 10.0000 mg | ORAL | Status: DC | PRN

## 2013-06-29 MED ORDER — DOXYCYCLINE HYCLATE 100 MG IV SOLR
100.0000 mg | Freq: Two times a day (BID) | INTRAVENOUS | Status: DC
  Administered 2013-06-29 – 2013-07-04 (×11): 100 mg via INTRAVENOUS
  Filled 2013-06-29 (×14): qty 100

## 2013-06-29 MED ORDER — ENSURE COMPLETE PO LIQD: 237.0000 mL | Freq: Two times a day (BID) | ORAL | Status: DC | PRN

## 2013-06-29 NOTE — Progress Notes (Signed)
NUTRITION FOLLOW UP  Intervention:    Discontinue Ensure Complete BID, provide PRN  Nutrition Dx:   Predicted suboptimal energy intake related to poor appetite/dietary preferences as evidenced by patient report and weight loss, no longer relevant-patient with documented inadequate oral intake  New Nutrition Dx: Inadequate oral intake related to chronic illness, as evidenced by PO intake 0-25%.  Goal:   Patient to meet >/= 90% of estimated nutrition needs, no longer appropriate  New Goal: Palliative feedings  Monitor:   Patient goals for nutrition care  Assessment:   Austin [name] is a 80 y.o. male,presents with complaints of hemoptysis, son and daughter at bedside, they give most of the history, patient has been coughing over the last 2 months, diagnosed with pneumonia, treated with Augmentin, had C. difficile is status post his treatment with by mouth vancomycin before 2 weeks, any diarrhea currently, and had persistent cough over a few weeks, with productive sputum, recently started to develop hemoptysis, minimal amount, mainly in a.m.,no hemoptysis here, patient had CT chest angiogram which did show mediastinal and left hilar mass, reports patient lost 40 pounds over last 5 months, is currently bedridden last few month after femur fracture, it is on Xarelto, time took it yesterday at 6 PM, patient reports dysphagia and cough to solids.  During last visit with dietetic intern, patient reported losing 40 lbs since his hip fracture in October due to dislike of hospital food. Patient was drinking 1-2 smoothies made with Ensure daily PTA.   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 Per SLP patient requires  Dys 3 with thin liquids. Per internal medicine note, patient with poor prognosis. Patient being followed by palliative care. Patient and family want palliative feedings. Patient open to palliative radiation treatments to help him eat.   Patient stated that he is having difficulty swallowing and would like to discontinue Ensure Complete. Patient stated that liquids like potato soup are easier for him to swallow.   Height: Ht Readings from Last 1 Encounters:  06/21/13 6\' 5"  (1.956 m)    Weight Status:   Wt Readings from Last 1 Encounters:  06/21/13 245 lb 6 oz (111.3 kg)    Re-estimated needs:  Kcal: 2300-2500  Protein: 115-125 grams  Fluid: 2.3-2.5 L  Skin: Stage II pressure ulcer on sacrum, wound on back  Diet Order: Dysphagia 3 with thin liquids   Intake/Output Summary (Last 24 hours) at 06/29/13 1208 Last data filed at 06/29/13 0914  Gross per 24 hour  Intake   1225 ml  Output   2900 ml  Net  -1675 ml    Last BM: 3/11   Labs:  No results found for this basename: NA, K, CL, CO2, BUN, CREATININE, CALCIUM, MG, PHOS, GLUCOSE,  in the last 168 hours  CBG (last 3)  No results found for this basename: GLUCAP,  in the last 72 hours  Scheduled Meds: . digoxin  0.125 mg Intravenous Daily  . doxycycline (VIBRAMYCIN) IV  100 mg Intravenous Q12H  . feeding supplement (ENSURE COMPLETE)  237 mL Oral BID BM  . levothyroxine  37.5 mcg Intravenous Daily    Continuous Infusions: . sodium chloride 20 mL/hr at 06/25/13 1611    Claudell Kyle, Dietetic Intern Pager: [phone]

## 2013-06-29 NOTE — Progress Notes (Signed)
PT Cancellation Note  Patient Details Name: Austin [name] MRN: [id number] DOB: 08/21/1926   Cancelled Treatment:     PT deferred this date.  Pt conferring with family and Home Hospice this am and initiating radiation this pm.  Will follow .   Fatumata Kashani 06/29/2013, 4:56 PM

## 2013-06-29 NOTE — Progress Notes (Signed)
06/29/2013 0720 Faxed referral to [place]. Jonnie Finner RN CCM Case Mgmt phone [phone]

## 2013-06-29 NOTE — Progress Notes (Signed)
   CARE MANAGEMENT NOTE 06/29/2013  Patient:  Austin [name], Austin [name]   Account Number:  [id number]  Date Initiated:  06/23/2013  Documentation initiated by:  Dessa Phi  Subjective/Objective Assessment:   80 Y/O M ADMITTED W/HEMOPTYSIS.     Action/Plan:   FROM HOME.HAS PCP,PHARMACY.   Anticipated DC Date:  06/29/2013   Anticipated DC Plan:  [place]  CM consult      PAC Choice  HOSPICE   Choice offered to / List presented to:  C-4 Adult Children        HH arranged  HH-1 RN      [place] [facility] [place]   Status of service:  Completed, signed off Medicare Important Message given?   (If response is "NO", the following Medicare IM given date fields will be blank) Date Medicare IM given:   Date Additional Medicare IM given:    Discharge Disposition:  [place]  Per UR Regulation:  Reviewed for med. necessity/level of care/duration of stay  If discussed at [place] of Stay Meetings, dates discussed:    Comments:  06/29/13 14:00 CM spoke with RN and Palliative Radiation will begin here this weekend.  CM spoke with Lesleigh Noe and Lesleigh Noe will meet with family on Monday; Margie states she will call Nunzio Cory and set up an appt.  CM will contine to follow for disposition.  Mariane Masters, BSN, CM [phone]. 11:20 CM met with Nunzio Cory, dtr of pt, in room to clarify bed extension request as [facility] states an extension was brought to pt 04/30/13.  Nunzio Cory states she has that extension and it is of little value as she states her father continues to work his way to the bottom of the bed . Family waiting on appt with MD and with [facility].  Will continue to follow.  Mariane Masters, BSN, Jearld Lesch [phone].  06/29/2013 0720 Faxed referral to [place]. Jonnie Finner RN CCM Case Mgmt phone [phone]  06/28/2013 1730 NCM spoke to pt and gave permission to speak to his son and dtr. Spoke to pt about desires for home and he wants Home  Hospice. Provided pt with list of Home Hospice providers. Dtr requested Hospice and [place]. States their father's hospital bed is not long enough. They are requesting a longer bed. Hospital bed is through [facility]. Will send message to [facility] rep for delivery of longer bed. Pt states he has w/c, RW, cane and oxygen at home. Will notify [facility] for referral. Jonnie Finner RN CCM Case Mgmt phone [phone]  06/25/2013 1545 NCM spoke to pt and gave permission to speak with son, Yohance # [phone] and Lillia Abed. Pt lives in home with Lillia Abed  # [phone], c- # [phone]. Children want to wait for final recommendations for home after procedure today. Will speak to pt and family on 3/10 about [place] or Home Hospice. Jonnie Finner RN CCM Case Mgmt phone [phone]  06/24/2013 1120 Pt is active with [facility] for [facility]. Jonnie Finner RN CCM Case Mgmt phone [phone]

## 2013-06-29 NOTE — Consult Note (Signed)
[place]  [phone] [phone]   MEDICAL ONCOLOGY - INITIAL CONSULATION    Referral MD: Dr. Candiss Norse  Reason for Referral: 80 year old male with new diagnosis of lung cancer  Chief Complaint  Patient presents with  . Hematemesis  : lung cancer  HPI: 80 year old male with multiple medical problems admitted with progressive cough and hemoptysis. CT angiogram revealed mediastinal and hilar mass 6.4 x 3.2 x 3.6. Biopsy revealed NSCLC. Patient has been seen by CCM, hospice/palliative care. Asked to see patient regarding any systemic treatment options. Patients family by bedside    Past Medical History  Diagnosis Date  . Hypertension   . Hyperlipidemia   . AAA (abdominal aortic aneurysm)   . Arthritis   . CAD (coronary artery disease)   . Thyroid disease     hypothyroidism  . Dysrhythmia     atrial flutter  . Shortness of breath     a little with exertion  . Polio 1934  . Atrial flutter     chronic anticoagulation  . COPD (chronic obstructive pulmonary disease)   . History of nuclear stress test 04/2010    dipyridamole; small, mostly fixed basal to mid inferior and inferoseptal defect (gut artifact v. scar); post stress EF 62%; non-diagnostic for ischemia; low risk   :  Past Surgical History  Procedure Laterality Date  . Abdominal aortic aneurysm repair  05/21/2010    aorto bi-iliac BPG  . Appendectomy  1949  . Popliteal synovial cyst excision Left   . Joint replacement      Knee and Hip replacements  . Total hip arthroplasty Left 1994  . Knee arthroplasty Left 2004  . Total hip revision Left 12/04/2012    Procedure: LEFT TOTAL HIP REVISION;  Surgeon: Mauri Pole, MD;  Location: [facility];  Service: Orthopedics;  Laterality: Left;  . Cardiac catheterization  2009    in [place]; 50-60% RCA  . Transthoracic echocardiogram  04/2010    EF =>55%; RV borderline dilated; LA mildly dilated; mild mitral annular calcif & mild MR; mild TR; mild  calcif of AV leaflets with mild AV stenosis; aortic root sclerosis/calcif  . Orif femur fracture Left 01/22/2013    Procedure: OPEN REDUCTION INTERNAL FIXATION (ORIF) DISTAL FEMUR FRACTURE;  Surgeon: Mauri Pole, MD;  Location: [facility];  Service: Orthopedics;  Laterality: Left;  . Video bronchoscopy Bilateral 06/25/2013    Procedure: VIDEO BRONCHOSCOPY WITHOUT FLUORO;  Surgeon: Rush Farmer, MD;  Location: [facility];  Service: Cardiopulmonary;  Laterality: Bilateral;  :  Current Facility-Administered Medications  Medication Dose Route Frequency Provider Last Rate Last Dose  . 0.9 %  sodium chloride infusion   Intravenous Continuous Rush Farmer, MD 20 mL/hr at 06/25/13 1611    . acetaminophen (TYLENOL) tablet 650 mg  650 mg Oral Q6H PRN [name], MD       Or  . acetaminophen (TYLENOL) suppository 650 mg  650 mg Rectal Q6H PRN [name], MD      . albuterol (PROVENTIL) (2.5 MG/3ML) 0.083% nebulizer solution 2.5 mg  2.5 mg Nebulization Q6H PRN [name], MD   2.5 mg at 06/21/13 2146  . albuterol (PROVENTIL) (2.5 MG/3ML) 0.083% nebulizer solution 2.5 mg  2.5 mg Nebulization Q2H PRN [name], MD   2.5 mg at 06/24/13 1114  . cholecalciferol (VITAMIN D) tablet 1,000 Units  1,000 Units Oral Daily [name], MD   1,000 Units at 06/28/13 1006  .  dextrose 5 % and 0.9 % NaCl with KCl 20 mEq/L infusion   Intravenous Continuous Kinnie Feil, MD 75 mL/hr at 06/29/13 0406    . digoxin (LANOXIN) 0.25 MG/ML injection 0.125 mg  0.125 mg Intravenous Daily Kinnie Feil, MD   0.125 mg at 06/28/13 1006  . feeding supplement (ENSURE COMPLETE) (ENSURE COMPLETE) liquid 237 mL  237 mL Oral BID BM Kinnie Feil, MD   237 mL at 06/28/13 1000  . fentaNYL (SUBLIMAZE) injection    PRN Rush Farmer, MD   25 mcg at 06/25/13 1642  . guaifenesin (ROBITUSSIN) 100 MG/5ML syrup 200 mg  200 mg Oral Q4H PRN Rhetta Mura Schorr, NP   200 mg at 06/29/13 0404  . levothyroxine  (SYNTHROID, LEVOTHROID) injection 37.5 mcg  37.5 mcg Intravenous Daily Kinnie Feil, MD   37.5 mcg at 06/28/13 1005  . lidocaine (XYLOCAINE) 1 % (with pres) injection    PRN Rush Farmer, MD   6 mL at 06/25/13 1542  . lidocaine (XYLOCAINE) 2 % jelly    PRN Rush Farmer, MD   1 application at 27/25/36 1541  . metoprolol (LOPRESSOR) injection 2.5 mg  2.5 mg Intravenous Q6H PRN Kinnie Feil, MD      . metoprolol tartrate (LOPRESSOR) tablet 25 mg  25 mg Oral BID [name], MD   25 mg at 06/28/13 1006  . midazolam (VERSED) injection    PRN Rush Farmer, MD   1 mg at 06/25/13 1622  . morphine 2 MG/ML injection 2 mg  2 mg Intravenous Q4H PRN Kinnie Feil, MD      . multivitamin with minerals tablet 1 tablet  1 tablet Oral Daily [name], MD   1 tablet at 06/28/13 1006  . ondansetron (ZOFRAN) tablet 4 mg  4 mg Oral Q6H PRN [name], MD       Or  . ondansetron (ZOFRAN) injection 4 mg  4 mg Intravenous Q6H PRN [name], MD   4 mg at 06/24/13 2123  . phenylephrine (NEO-SYNEPHRINE) 0.25 % nasal spray    PRN Rush Farmer, MD   1 spray at 06/25/13 1540  . simvastatin (ZOCOR) tablet 20 mg  20 mg Oral q1800 [name], MD   20 mg at 06/27/13 1815     No Known Allergies:  Family History  Problem Relation Age of Onset  . Other Father     varicose veins  . Hyperlipidemia Son   . Hypertension Son   . AAA (abdominal aortic aneurysm) Son   . Lung cancer Brother   :  History   Social History  . Marital Status: Widowed    Spouse Name: N/A    Number of Children: 3  . Years of Education: N/A   Occupational History  . retired Economist    Social History Main Topics  . Smoking status: Former Smoker -- 1.00 packs/day for 25 years    Types: Cigarettes    Quit date: 04/19/1966  . Smokeless tobacco: Never Used     Comment: 100 pack years per 08/2012 note  . Alcohol Use: 0.0 oz/week     Comment: glass of wine daily, scotch 3 or 4 times a  week. Not in last 3 months  . Drug Use: No  . Sexual Activity: No   Other Topics Concern  . Not on file   Social History Narrative  . No narrative on file  :    Exam:  Patient Vitals for the past 24 hrs:  BP Temp Temp src Pulse Resp SpO2  06/28/13 2045 139/74 mmHg 98.2 F (36.8 C) Oral 68 20 99 %  06/28/13 1525 126/78 mmHg 98.7 F (37.1 C) Oral 64 19 90 %   Well-developed nourished male in no acute distress HEENT exam: EOMI PERRLA sclerae anicteric no conjunctival pallor oral mucosa is moist neck supple no palpable cervical supraclavicular or axillary adenopathy Lungs: Clear to auscultation and percussion distant breath sounds  Cardiovascular: Regular rate rhythm no murmurs gallops or rubs Abdomen: Soft nontender nondistended bowel sounds are present no hepatosplenomegaly or palpable masses Extremities: No edema clubbing or cyanosis, pedal pulses are present Neuro: Alert oriented x3 DTRs +4 strength is symmetrical in upper and lower extremities, Skin: Warm and moist, good capillary filling, no rashes.       Lab Results  Component Value Date   WBC 6.9 06/24/2013   HGB 11.1* 06/24/2013   HCT 32.9* 06/24/2013   PLT 181 06/24/2013   GLUCOSE 97 06/22/2013   CHOL 84 05/05/2013   TRIG 89 05/05/2013   HDL 22* 05/05/2013   LDLCALC 44 05/05/2013   ALT 17 06/22/2013   AST 16 06/22/2013   NA 138 06/22/2013   K 3.8 06/22/2013   CL 99 06/22/2013   CREATININE 0.67 06/22/2013   BUN 13 06/22/2013   CO2 27 06/22/2013   TSH 2.693 05/12/2013   INR 1.50* 06/22/2013   HGBA1C 6.1* 05/12/2013    Ct Angio Chest Pe W/cm &/or Wo Cm  06/22/2013   ADDENDUM REPORT: 06/22/2013 10:45  ADDENDUM: The patient has a large left hilar mass that extends into the mediastinum into the subcarinal region. Its medial portion is inseparable from the abnormal soft tissues at the junction of the mid and distal thirds of the esophagus. It is this portion of the mass that abuts the anterior aspect of the abdominal aorta. The maximal  dimensions of the subcarinal mass are approximately 6.4 cm transversely x 3.2 cm AP x 3.6 cm longitudinally.  In the left axillary region there is a borderline enlarged lymph node measuring 1.4 cm in diameter.   Electronically Signed   By: David  Martinique   On: 06/22/2013 10:45   06/22/2013   CLINICAL DATA:  Hemoptysis, left lower extremity edema, recent hip surgery  EXAM: CT ANGIOGRAPHY CHEST WITH CONTRAST  TECHNIQUE: Multidetector CT imaging of the chest was performed using the standard protocol during bolus administration of intravenous contrast. Multiplanar CT image reconstructions and MIPs were obtained to evaluate the vascular anatomy.  CONTRAST:  134mL OMNIPAQUE IOHEXOL 350 MG/ML SOLN  COMPARISON:  DG CHEST 1V PORT dated 05/12/2013  FINDINGS: The contrast bolus is limited in terms of density. No definite intraluminal pulmonary artery defects are demonstrated. There is an abnormal mediastinal and left hilar mass. This is producing significant mass effect upon the proximal left main pulmonary artery, but flow distally is still demonstrated. There is also mass effect upon the esophagus with distention of the proximal esophagus to the level of the carina. There is an anterior left hilar soft tissue mass 3.2 cm transversely by 4.9 cm AP. There is a 2 cm right hilar lymph node on image 48. Definite paratracheal lymphadenopathy is not demonstrated. The cardiac chambers are mildly enlarged. There is no pericardial effusion. The caliber of the thoracic aorta is normal. These soft tissue mass abuts the anterior and left lateral surface of the descending thoracic aorta.  There is a small left pleural effusion. Patchy  areas of increased parenchymal density are present in the left upper lobe. Confluent increased density peripherally in the left lower lobe is present. On the right there is subsegmental atelectasis or early infiltrate in the lower lobe. There are emphysematous changes in both lungs.  Within the upper abdomen  the observed portions of the liver and spleen appear normal. The adrenal glands are not included in the field of view. The thoracic vertebral bodies are preserved in height. The sternum appears intact. The observed portions of the ribs exhibit no acute abnormalities.  Review of the MIP images confirms the above findings.  IMPRESSION: 1. There is an abnormal mediastinal and left hilar mass producing mass effect upon the left main pulmonary artery. Classic pulmonary emboli are not demonstrated but vascular flow to the left lung is markedly impaired by the large mass. This mass is also resulting in esophageal obstruction with the proximal and mid portions of the esophagus distended with fluid and gas. The appearance is consistent with a central lung malignancy or malignancy of the distal esophagus. 2. There is a small left pleural effusion. 3. There is postobstructive atelectasis in the left lower lobe. Patchy areas of interstitial density in the left upper lobe are nonspecific but could reflect parenchymal metastatic disease. There is likely atelectasis or early infiltrate in the right lower lobe. 4. These results were called by telephone at the time of interpretation on 06/21/2013 at 6:00 PM to Dr. Tanna Furry , who verbally acknowledged these results.  Electronically Signed: By: David  Martinique On: 06/21/2013 18:02   Dg Esophagus  06/23/2013   CLINICAL DATA:  Dysphagia. Blockage seen on modified barium swallow.  EXAM: ESOPHOGRAM/BARIUM SWALLOW  TECHNIQUE: Single contrast examination was performed using  thin barium.  COMPARISON:  Chest CT 06/21/2013  FLUOROSCOPY TIME:  1 min, 35 seconds  FINDINGS: Prior to the patient ingesting additional barium, fluoroscopy demonstrates retained food/debris as well as barium from a earlier modified barium swallow study. The patient was able to take several swallows of thin barium. This mixes with the foods/ debris in the obstructed proximal to mid esophagus, with irregular tapering  just below the aortic arch compatible with near complete obstructing esophageal mass. A small amount of contrast passes through this area in into the distal esophagus and stomach.  IMPRESSION: Near complete obstruction of the mid esophagus with irregular tapering of the esophagus at this level concerning for esophageal mass/ neoplasm.   Electronically Signed   By: Rolm Baptise M.D.   On: 06/23/2013 17:25   Dg Swallowing Func-speech Pathology  06/23/2013   Macario Golds, CCC-SLP     06/23/2013  4:27 PM Objective Swallowing Evaluation: Modified Barium Swallowing Study   Patient Details  Name: Austin [name] MRN: [id number] Date of Birth: [birth year]  Today's Date: 06/23/2013 Time: 3818-2993 SLP Time Calculation (min): 52 min  Past Medical History:  Past Medical History  Diagnosis Date  . Hypertension   . Hyperlipidemia   . AAA (abdominal aortic aneurysm)   . Arthritis   . CAD (coronary artery disease)   . Thyroid disease     hypothyroidism  . Dysrhythmia     atrial flutter  . Shortness of breath     a little with exertion  . Polio 1934  . Atrial flutter     chronic anticoagulation  . COPD (chronic obstructive pulmonary disease)   . History of nuclear stress test 04/2010    dipyridamole; small, mostly fixed basal to mid inferior and  inferoseptal  defect (gut artifact v. scar); post stress EF 62%;  non-diagnostic for ischemia; low risk    Past Surgical History:  Past Surgical History  Procedure Laterality Date  . Abdominal aortic aneurysm repair  05/21/2010    aorto bi-iliac BPG  . Appendectomy  1949  . Popliteal synovial cyst excision Left   . Joint replacement      Knee and Hip replacements  . Total hip arthroplasty Left 1994  . Knee arthroplasty Left 2004  . Total hip revision Left 12/04/2012    Procedure: LEFT TOTAL HIP REVISION;  Surgeon: Mauri Pole,  MD;  Location: [facility];  Service: Orthopedics;  Laterality: Left;  . Cardiac catheterization  2009    in [place]; 50-60% RCA  . Transthoracic echocardiogram  04/2010     EF =>55%; RV borderline dilated; LA mildly dilated; mild mitral  annular calcif & mild MR; mild TR; mild calcif of AV leaflets  with mild AV stenosis; aortic root sclerosis/calcif  . Orif femur fracture Left 01/22/2013    Procedure: OPEN REDUCTION INTERNAL FIXATION (ORIF) DISTAL FEMUR  FRACTURE;  Surgeon: Mauri Pole, MD;  Location: [facility];   Service: Orthopedics;  Laterality: Left;   HPI:  80 yo male adm to [facility] with hemopytsis.  Pt found to have  subcarinal mass with esophageal obstruction proximal and mid with  gas noted, Chest imaging showed post obstructive atelectasis LLL,  ? RLL infection/ ATX.  Pt reports 43 pound weight loss since Oct  2014 and aphonia x 2 1/2 months with gradual onset and no  worsening.  Pt also with choking on food more than drink per his  report.  RN reports pt coughing with medicine.  BSE ordered.      Assessment / Plan / Recommendation Clinical Impression  Dysphagia Diagnosis: Mild oral phase dysphagia;Moderate  pharyngeal phase dysphagia;Suspected primary esophageal  dysphagia;Moderate cervical esophageal phase dysphagia   Clinical impression: Mild oral and moderate pharyngeal-cervical  esophageal dysphagia with suspected primary esophageal deficits.   Pt has sensorimotor deficits resulting in weak muscle contraction  and pharyngeal stasis without pt awareness.  Laryngeal elevation  was very poor negatively impacting airway protection and UES  opening.   Chin tuck posture along with dry swallows decreased  amount of pharyngeal stasis of liquids.  Head turn right with  chin tuck not effective.  Laryngeal penetration (silent) of small  amount of barium noted after swallow without pt awareness-  presumed from pyriform residuals spilling into open larynx.    Pt coughed during MBS without barium visualized in larynx -  suspect possible secretion aspiration occuring based on secretion  stasis in pharynx.    SLP only tested liquids due to appearance of stasis and narrowing  in  mid-esophagus that did NOT clear with multiple swallows or  liquids. Diffiuclt situation due to multifactorial dysphagia as  pt with at risk of aspiration due to oropharyngeal weakness and  suspected esophageal component (appearance of narrowing with  retained secretions above - ? from extrinsic compression from  mass.  Please note radiologist not present during MBS to confirm  findings.      Recommend medicine be given IV and pt be allowed SMALL amounts of  tsps of thin water (? clears) with chin tuck and dry swallows for  comfort.    Rec dc intake if pt coughing or senses stasis *he did not have  sensation to poor clearance during entire test.  Pt given oral  suction and SLP demonstrated use  to help clear secretions from  pharynx if able to cough.  As pt reports h/o dysphagia and  coughing up secretions x3 weeks, suspect acute on chronic  dysphagia.       Treatment Recommendation  Therapy as outlined in treatment plan below    Diet Recommendation  (? single tsps clears with chin tuck - SMALL  SINGLE TSPS ONLY)   Liquid Administration via: Spoon Medication Administration: Via alternative means Compensations: Multiple dry swallows after each bite/sip;Hard  cough after swallow Postural Changes and/or Swallow Maneuvers: Seated upright 90  degrees;Upright 30-60 min after meal;Chin tuck    Other  Recommendations Recommended Consults: MBS Oral Care Recommendations: Oral care Q4 per protocol   Follow Up Recommendations   ? Referral to address possible masses causing extrinsic  obstruction on esophagus   Frequency and Duration min 2x/week  2 weeks        General Date of Onset: 06/23/13 HPI: 80 yo male adm to [facility] with hemopytsis.  Pt found to have  subcarinal mass with esophageal obstruction proximal and mid with  gas noted, Chest imaging showed post obstructive atelectasis LLL,  ? RLL infection/ ATX.  Pt reports 43 pound weight loss since Oct  2014 and aphonia x 2 1/2 months with gradual onset and no  worsening.  Pt also  with choking on food more than drink per his  report.  RN reports pt coughing with medicine.  BSE ordered.  Type of Study: Modified Barium Swallowing Study Reason for Referral: Objectively evaluate swallowing function Diet Prior to this Study: NPO Temperature Spikes Noted: No Respiratory Status: Nasal cannula History of Recent Intubation: No Behavior/Cognition: Alert;Cooperative;Pleasant mood Oral Cavity - Dentition: Missing dentition (few teeth) Oral Motor / Sensory Function: Impaired - see Bedside swallow  eval Self-Feeding Abilities: Able to feed self Patient Positioning: Upright in chair Baseline Vocal Quality: Low vocal intensity;Breathy Volitional Cough: Weak Volitional Swallow: Able to elicit Pharyngeal Secretions: Standing secretions in (comment) (pharynx)     Reason for Referral Objectively evaluate swallowing function   Oral Phase Oral Preparation/Oral Phase Oral Phase: Impaired Oral - Nectar Oral - Nectar Teaspoon: Reduced posterior  propulsion;Lingual/palatal residue;Weak lingual manipulation Oral - Nectar Straw: Reduced posterior propulsion;Lingual/palatal  residue;Weak lingual manipulation Oral - Thin Oral - Thin Teaspoon: Reduced posterior  propulsion;Lingual/palatal residue;Weak lingual manipulation Oral - Thin Cup: Reduced posterior propulsion;Lingual/palatal  residue;Weak lingual manipulation Oral - Thin Straw: Reduced posterior propulsion;Lingual/palatal  residue;Weak lingual manipulation   Pharyngeal Phase Pharyngeal Phase Pharyngeal Phase: Impaired Pharyngeal - Nectar Pharyngeal - Nectar Teaspoon: Reduced epiglottic  inversion;Reduced pharyngeal peristalsis;Reduced anterior  laryngeal mobility;Reduced laryngeal elevation;Pharyngeal residue  - pyriform sinuses;Pharyngeal residue - valleculae;Trace  aspiration;Pharyngeal residue - cp segment Pharyngeal - Nectar Cup: Reduced epiglottic inversion;Reduced  pharyngeal peristalsis;Reduced anterior laryngeal  mobility;Reduced laryngeal  elevation;Pharyngeal residue -  valleculae;Pharyngeal residue - pyriform sinuses;Reduced tongue  base retraction;Pharyngeal residue - cp  segment;Penetration/Aspiration after swallow Penetration/Aspiration details (nectar cup): Material enters  airway, remains ABOVE vocal cords and not ejected out Pharyngeal - Thin Pharyngeal - Thin Teaspoon: Reduced epiglottic inversion;Reduced  pharyngeal peristalsis;Reduced anterior laryngeal  mobility;Reduced laryngeal elevation;Pharyngeal residue -  valleculae;Pharyngeal residue - pyriform sinuses;Reduced tongue  base retraction;Pharyngeal residue - cp segment Pharyngeal - Thin Cup: Reduced epiglottic inversion;Reduced  pharyngeal peristalsis;Reduced anterior laryngeal  mobility;Reduced laryngeal elevation;Pharyngeal residue -  valleculae;Pharyngeal residue - pyriform sinuses;Reduced tongue  base retraction;Pharyngeal residue - cp segment Pharyngeal - Thin Straw: Reduced epiglottic inversion;Reduced  pharyngeal peristalsis;Reduced anterior laryngeal  mobility;Reduced laryngeal elevation;Pharyngeal residue -  valleculae;Pharyngeal  residue - pyriform sinuses;Reduced tongue  base retraction;Pharyngeal residue - cp segment Pharyngeal Phase - Comment Pharyngeal Comment: chin tuck with liquids decreases amount of  pharyngeal accumulation, cued dry swallows - pt able to cough and  "hock" up mild amount of stasis x1, does not sense pharygneal  stasis  Cervical Esophageal Phase    GO    Cervical Esophageal Phase Cervical Esophageal Phase: Impaired Cervical Esophageal Phase - Nectar Nectar Teaspoon: Reduced cricopharyngeal relaxation Nectar Cup: Reduced cricopharyngeal relaxation Cervical Esophageal Phase - Thin Thin Teaspoon: Reduced cricopharyngeal relaxation Thin Cup: Reduced cricopharyngeal relaxation Thin Straw: Reduced cricopharyngeal relaxation         Luanna Salk, MS [facility] SLP [phone]       Ct Angio Chest Pe W/cm &/or Wo Cm  06/22/2013   ADDENDUM REPORT: 06/22/2013 10:45   ADDENDUM: The patient has a large left hilar mass that extends into the mediastinum into the subcarinal region. Its medial portion is inseparable from the abnormal soft tissues at the junction of the mid and distal thirds of the esophagus. It is this portion of the mass that abuts the anterior aspect of the abdominal aorta. The maximal dimensions of the subcarinal mass are approximately 6.4 cm transversely x 3.2 cm AP x 3.6 cm longitudinally.  In the left axillary region there is a borderline enlarged lymph node measuring 1.4 cm in diameter.   Electronically Signed   By: David  Martinique   On: 06/22/2013 10:45   06/22/2013   CLINICAL DATA:  Hemoptysis, left lower extremity edema, recent hip surgery  EXAM: CT ANGIOGRAPHY CHEST WITH CONTRAST  TECHNIQUE: Multidetector CT imaging of the chest was performed using the standard protocol during bolus administration of intravenous contrast. Multiplanar CT image reconstructions and MIPs were obtained to evaluate the vascular anatomy.  CONTRAST:  145mL OMNIPAQUE IOHEXOL 350 MG/ML SOLN  COMPARISON:  DG CHEST 1V PORT dated 05/12/2013  FINDINGS: The contrast bolus is limited in terms of density. No definite intraluminal pulmonary artery defects are demonstrated. There is an abnormal mediastinal and left hilar mass. This is producing significant mass effect upon the proximal left main pulmonary artery, but flow distally is still demonstrated. There is also mass effect upon the esophagus with distention of the proximal esophagus to the level of the carina. There is an anterior left hilar soft tissue mass 3.2 cm transversely by 4.9 cm AP. There is a 2 cm right hilar lymph node on image 48. Definite paratracheal lymphadenopathy is not demonstrated. The cardiac chambers are mildly enlarged. There is no pericardial effusion. The caliber of the thoracic aorta is normal. These soft tissue mass abuts the anterior and left lateral surface of the descending thoracic aorta.  There is a small  left pleural effusion. Patchy areas of increased parenchymal density are present in the left upper lobe. Confluent increased density peripherally in the left lower lobe is present. On the right there is subsegmental atelectasis or early infiltrate in the lower lobe. There are emphysematous changes in both lungs.  Within the upper abdomen the observed portions of the liver and spleen appear normal. The adrenal glands are not included in the field of view. The thoracic vertebral bodies are preserved in height. The sternum appears intact. The observed portions of the ribs exhibit no acute abnormalities.  Review of the MIP images confirms the above findings.  IMPRESSION: 1. There is an abnormal mediastinal and left hilar mass producing mass effect upon the left main pulmonary artery. Classic pulmonary emboli are not demonstrated  but vascular flow to the left lung is markedly impaired by the large mass. This mass is also resulting in esophageal obstruction with the proximal and mid portions of the esophagus distended with fluid and gas. The appearance is consistent with a central lung malignancy or malignancy of the distal esophagus. 2. There is a small left pleural effusion. 3. There is postobstructive atelectasis in the left lower lobe. Patchy areas of interstitial density in the left upper lobe are nonspecific but could reflect parenchymal metastatic disease. There is likely atelectasis or early infiltrate in the right lower lobe. 4. These results were called by telephone at the time of interpretation on 06/21/2013 at 6:00 PM to Dr. Tanna Furry , who verbally acknowledged these results.  Electronically Signed: By: David  Martin[place] On: 06/21/2013 18:02   Dg Esophagus  06/23/2013   CLINICAL DATA:  Dysphagia. Blockage seen on modified barium swallow.  EXAM: ESOPHOGRAM/BARIUM SWALLOW  TECHNIQUE: Single contrast examination was performed using  thin barium.  COMPARISON:  Chest CT 06/21/2013  FLUOROSCOPY TIME:  1 min, 35  seconds  FINDINGS: Prior to the patient ingesting additional barium, fluoroscopy demonstrates retained food/debris as well as barium from a earlier modified barium swallow study. The patient was able to take several swallows of thin barium. This mixes with the foods/ debris in the obstructed proximal to mid esophagus, with irregular tapering just below the aortic arch compatible with near complete obstructing esophageal mass. A small amount of contrast passes through this area in into the distal esophagus and stomach.  IMPRESSION: Near complete obstruction of the mid esophagus with irregular tapering of the esophagus at this level concerning for esophageal mass/ neoplasm.   Electronically Signed   By: Rolm Baptise M.D.   On: 06/23/2013 17:25   Dg Swallowing Func-speech Pathology  06/23/2013   Macario Golds, CCC-SLP     06/23/2013  4:27 PM Objective Swallowing Evaluation: Modified Barium Swallowing Study   Patient Details  Name: Austin [name] MRN: [id number] Date of Birth: Jan 18, 1927  Today's Date: 06/23/2013 Time: 8563-1497 SLP Time Calculation (min): 52 min  Past Medical History:  Past Medical History  Diagnosis Date  . Hypertension   . Hyperlipidemia   . AAA (abdominal aortic aneurysm)   . Arthritis   . CAD (coronary artery disease)   . Thyroid disease     hypothyroidism  . Dysrhythmia     atrial flutter  . Shortness of breath     a little with exertion  . Polio 1934  . Atrial flutter     chronic anticoagulation  . COPD (chronic obstructive pulmonary disease)   . History of nuclear stress test 04/2010    dipyridamole; small, mostly fixed basal to mid inferior and  inferoseptal defect (gut artifact v. scar); post stress EF 62%;  non-diagnostic for ischemia; low risk    Past Surgical History:  Past Surgical History  Procedure Laterality Date  . Abdominal aortic aneurysm repair  05/21/2010    aorto bi-iliac BPG  . Appendectomy  1949  . Popliteal synovial cyst excision Left   . Joint replacement      Knee and  Hip replacements  . Total hip arthroplasty Left 1994  . Knee arthroplasty Left 2004  . Total hip revision Left 12/04/2012    Procedure: LEFT TOTAL HIP REVISION;  Surgeon: Mauri Pole,  MD;  Location: [facility];  Service: Orthopedics;  Laterality: Left;  . Cardiac catheterization  2009    in [place]; 50-60% RCA  .  Transthoracic echocardiogram  04/2010    EF =>55%; RV borderline dilated; LA mildly dilated; mild mitral  annular calcif & mild MR; mild TR; mild calcif of AV leaflets  with mild AV stenosis; aortic root sclerosis/calcif  . Orif femur fracture Left 01/22/2013    Procedure: OPEN REDUCTION INTERNAL FIXATION (ORIF) DISTAL FEMUR  FRACTURE;  Surgeon: Mauri Pole, MD;  Location: [facility];   Service: Orthopedics;  Laterality: Left;   HPI:  80 yo male adm to [facility] with hemopytsis.  Pt found to have  subcarinal mass with esophageal obstruction proximal and mid with  gas noted, Chest imaging showed post obstructive atelectasis LLL,  ? RLL infection/ ATX.  Pt reports 43 pound weight loss since Oct  2014 and aphonia x 2 1/2 months with gradual onset and no  worsening.  Pt also with choking on food more than drink per his  report.  RN reports pt coughing with medicine.  BSE ordered.      Assessment / Plan / Recommendation Clinical Impression  Dysphagia Diagnosis: Mild oral phase dysphagia;Moderate  pharyngeal phase dysphagia;Suspected primary esophageal  dysphagia;Moderate cervical esophageal phase dysphagia   Clinical impression: Mild oral and moderate pharyngeal-cervical  esophageal dysphagia with suspected primary esophageal deficits.   Pt has sensorimotor deficits resulting in weak muscle contraction  and pharyngeal stasis without pt awareness.  Laryngeal elevation  was very poor negatively impacting airway protection and UES  opening.   Chin tuck posture along with dry swallows decreased  amount of pharyngeal stasis of liquids.  Head turn right with  chin tuck not effective.  Laryngeal penetration (silent) of small  amount  of barium noted after swallow without pt awareness-  presumed from pyriform residuals spilling into open larynx.    Pt coughed during MBS without barium visualized in larynx -  suspect possible secretion aspiration occuring based on secretion  stasis in pharynx.    SLP only tested liquids due to appearance of stasis and narrowing  in mid-esophagus that did NOT clear with multiple swallows or  liquids. Diffiuclt situation due to multifactorial dysphagia as  pt with at risk of aspiration due to oropharyngeal weakness and  suspected esophageal component (appearance of narrowing with  retained secretions above - ? from extrinsic compression from  mass.  Please note radiologist not present during MBS to confirm  findings.      Recommend medicine be given IV and pt be allowed SMALL amounts of  tsps of thin water (? clears) with chin tuck and dry swallows for  comfort.    Rec dc intake if pt coughing or senses stasis *he did not have  sensation to poor clearance during entire test.  Pt given oral  suction and SLP demonstrated use to help clear secretions from  pharynx if able to cough.  As pt reports h/o dysphagia and  coughing up secretions x3 weeks, suspect acute on chronic  dysphagia.       Treatment Recommendation  Therapy as outlined in treatment plan below    Diet Recommendation  (? single tsps clears with chin tuck - SMALL  SINGLE TSPS ONLY)   Liquid Administration via: Spoon Medication Administration: Via alternative means Compensations: Multiple dry swallows after each bite/sip;Hard  cough after swallow Postural Changes and/or Swallow Maneuvers: Seated upright 90  degrees;Upright 30-60 min after meal;Chin tuck    Other  Recommendations Recommended Consults: MBS Oral Care Recommendations: Oral care Q4 per protocol   Follow Up Recommendations   ? Referral to address  possible masses causing extrinsic  obstruction on esophagus   Frequency and Duration min 2x/week  2 weeks        General Date of Onset: 06/23/13 HPI: 80  yo male adm to [facility] with hemopytsis.  Pt found to have  subcarinal mass with esophageal obstruction proximal and mid with  gas noted, Chest imaging showed post obstructive atelectasis LLL,  ? RLL infection/ ATX.  Pt reports 43 pound weight loss since Oct  2014 and aphonia x 2 1/2 months with gradual onset and no  worsening.  Pt also with choking on food more than drink per his  report.  RN reports pt coughing with medicine.  BSE ordered.  Type of Study: Modified Barium Swallowing Study Reason for Referral: Objectively evaluate swallowing function Diet Prior to this Study: NPO Temperature Spikes Noted: No Respiratory Status: Nasal cannula History of Recent Intubation: No Behavior/Cognition: Alert;Cooperative;Pleasant mood Oral Cavity - Dentition: Missing dentition (few teeth) Oral Motor / Sensory Function: Impaired - see Bedside swallow  eval Self-Feeding Abilities: Able to feed self Patient Positioning: Upright in chair Baseline Vocal Quality: Low vocal intensity;Breathy Volitional Cough: Weak Volitional Swallow: Able to elicit Pharyngeal Secretions: Standing secretions in (comment) (pharynx)     Reason for Referral Objectively evaluate swallowing function   Oral Phase Oral Preparation/Oral Phase Oral Phase: Impaired Oral - Nectar Oral - Nectar Teaspoon: Reduced posterior  propulsion;Lingual/palatal residue;Weak lingual manipulation Oral - Nectar Straw: Reduced posterior propulsion;Lingual/palatal  residue;Weak lingual manipulation Oral - Thin Oral - Thin Teaspoon: Reduced posterior  propulsion;Lingual/palatal residue;Weak lingual manipulation Oral - Thin Cup: Reduced posterior propulsion;Lingual/palatal  residue;Weak lingual manipulation Oral - Thin Straw: Reduced posterior propulsion;Lingual/palatal  residue;Weak lingual manipulation   Pharyngeal Phase Pharyngeal Phase Pharyngeal Phase: Impaired Pharyngeal - Nectar Pharyngeal - Nectar Teaspoon: Reduced epiglottic  inversion;Reduced pharyngeal peristalsis;Reduced  anterior  laryngeal mobility;Reduced laryngeal elevation;Pharyngeal residue  - pyriform sinuses;Pharyngeal residue - valleculae;Trace  aspiration;Pharyngeal residue - cp segment Pharyngeal - Nectar Cup: Reduced epiglottic inversion;Reduced  pharyngeal peristalsis;Reduced anterior laryngeal  mobility;Reduced laryngeal elevation;Pharyngeal residue -  valleculae;Pharyngeal residue - pyriform sinuses;Reduced tongue  base retraction;Pharyngeal residue - cp  segment;Penetration/Aspiration after swallow Penetration/Aspiration details (nectar cup): Material enters  airway, remains ABOVE vocal cords and not ejected out Pharyngeal - Thin Pharyngeal - Thin Teaspoon: Reduced epiglottic inversion;Reduced  pharyngeal peristalsis;Reduced anterior laryngeal  mobility;Reduced laryngeal elevation;Pharyngeal residue -  valleculae;Pharyngeal residue - pyriform sinuses;Reduced tongue  base retraction;Pharyngeal residue - cp segment Pharyngeal - Thin Cup: Reduced epiglottic inversion;Reduced  pharyngeal peristalsis;Reduced anterior laryngeal  mobility;Reduced laryngeal elevation;Pharyngeal residue -  valleculae;Pharyngeal residue - pyriform sinuses;Reduced tongue  base retraction;Pharyngeal residue - cp segment Pharyngeal - Thin Straw: Reduced epiglottic inversion;Reduced  pharyngeal peristalsis;Reduced anterior laryngeal  mobility;Reduced laryngeal elevation;Pharyngeal residue -  valleculae;Pharyngeal residue - pyriform sinuses;Reduced tongue  base retraction;Pharyngeal residue - cp segment Pharyngeal Phase - Comment Pharyngeal Comment: chin tuck with liquids decreases amount of  pharyngeal accumulation, cued dry swallows - pt able to cough and  "hock" up mild amount of stasis x1, does not sense pharygneal  stasis  Cervical Esophageal Phase    GO    Cervical Esophageal Phase Cervical Esophageal Phase: Impaired Cervical Esophageal Phase - Nectar Nectar Teaspoon: Reduced cricopharyngeal relaxation Nectar Cup: Reduced cricopharyngeal  relaxation Cervical Esophageal Phase - Thin Thin Teaspoon: Reduced cricopharyngeal relaxation Thin Cup: Reduced cricopharyngeal relaxation Thin Straw: Reduced cricopharyngeal relaxation         Luanna Salk, MS [facility] SLP [phone]     Assessment and Plan:  80 y/o male  1. NSCLC: presenting with cough/hemptysis, eventual work up including biopsy c/w NSCLC. Patient and I discussed different treatment options including radiation vs chemotherapy vs combination of chemo/RT. Patient and his family are very clear about exactly what they would like to achieve from any kind of therapy..QUALITY OF LIFE. Patient does not want any kind of chemotherapy but he is open to RT so that he can eat. Family would like abide by patients wishes.  2. Agree with primary team regarding hospice consultation. Meeting set for 3/13. Patient also needs to be seen by RT. I have contacted Dr. Sondra Come and he will see the patient on 3/13.  3. When patient goes home it would be my honor to see this patient in my office in follow up and to be his oncologist.  The length of time of the face-to-face encounter was 60    minutes. More than 50% of time was spent counseling and coordination of care.  Marcy Panning, MD Medical/Oncology [facility] [phone] (beeper) [phone] (Office)

## 2013-06-29 NOTE — Progress Notes (Signed)
I agree with dietetic intern's follow up note.   Mikey College MS, [place], [address] Pager [phone] After Hours Pager

## 2013-06-29 NOTE — Progress Notes (Signed)
  Radiation Oncology         [phone] ________________________________  Name: Austin [name] MRN: [id number]  Date: 06/29/2013  DOB: 12-22-1926  SIMULATION AND TREATMENT PLANNING NOTE  DIAGNOSIS:  Non-small cell lung cancer  Site:  Mediastinum/left hilum  NARRATIVE:  The patient was brought to the [place] suite.  Identity was confirmed.  All relevant records and images related to the planned course of therapy were reviewed.   Written consent to proceed with treatment was confirmed which was freely given after reviewing the details related to the planned course of therapy had been reviewed with the patient.  Then, the patient was set-up in a stable reproducible  supine position for radiation therapy.  CT images were obtained.  Surface markings were placed.    Medically necessary treatment device(s) for immobilization:  Wing board.   The CT images were loaded into the planning software.  Then the target and avoidance structures were contoured.  Treatment planning then occurred.  The radiation prescription was entered and confirmed.  A total of 3 complex treatment devices were fabricated which relate to the designed radiation treatment fields. Each of these customized fields/ complex treatment devices will be used on a daily basis during the radiation course. I have requested : 3D Simulation  I have requested a DVH of the following structures: Gross tumor volume, lungs, esophagus, spinal cord.   PLAN:  The patient will receive 30 Gy in 10 fractions.  ________________________________   Jodelle Gross, MD, PhD

## 2013-06-29 NOTE — Progress Notes (Signed)
Patient Demographics  Austin [name], is a 80 y.o. male, DOB - 05-30-1926, TOI:[id number]  Admit date - 06/21/2013   Admitting Physician [name], MD  Outpatient Primary MD for the patient is [name], MD  LOS - 8   Chief Complaint  Patient presents with  . Hematemesis        Assessment & Plan    80 y/o male with PMH of HTN, COPD, HPL, A fib, CVA, recent sepsis, recurrent pneumonia, c diff presented with hemoptysis found to have lung, esophageal mass.      1. Hemoptysis/Lung mass likely lung CA (+tobacco use); likely lung CA with mets to esophagus - CT: abnormal mediastinal and left hilar mass producing mass effect upon the left main pulmonary artery mass is also resulting in esophageal obstruction with the proximal and mid portions of the esophagus The appearance is consistent with a central lung malignancy or malignancy of the distal esophagus. Old xaralto for chronic anticoagulation for atrial fibrillation , eating currently stable.  3/9: s/p bronchoscopy and biopsy shows changes consistent with non-small cell lung cancer, appreciate Onco input, we await Rad-onco input today, patient open for palliative radiation treatments, also palliative care has been consulted. Longterm Prognosis is poor.  - cultures +GPC, but afebrile; no leukocytosis; competed atx last month; monitor off abx;        2. Dysphagia due esophageal mass lesion: failed swallow evaluation; at risk for aspiration, recent h/p aspiration PNA  - esophagogram: Near complete obstruction of the mid esophagus with irregular tapering of the esophagus atthis level concerning for esophageal mass/ neoplasm.  - per GI hold EGD; ? Need stenting (if done will need tracheal stenting as well) - d/w patient, family they want  palliative feeding, understanding the risk of aspiration;      3. Atrial fibrillation: rate Controlled, continue with metoprolol and digoxin, hold anticoagulation it due to to #1     4. Hypertension: Acceptable continue with metoprolol     5. Hyperlipedemia: cont with statin     6. COPD; h/o tobacco use; no wheezing on exam; cont bronchodilators as needed     7. History of CVA holding Xarelto due to hemoptysis; continue statin      8. History of recent left hip repair  - per notes: Dr. Alvan Dame on 1/28-okay for 50% weightbearing on the left lower extremity. Patient to followup with Dr. Alvan Dame in in one month for repeat x-rays, if continues to do well with good healing, patient will likely be advanced to more weightbearing status  -xray: 1/28: Healing fracture involving the distal shaft of the left femur status post sideplate and screw device reduction     Prognosis is guarded,  -likely will need hospice care, palliative care consulted       Code Status: DNR  Family Communication:    Disposition Plan:  TBD   Procedures     Consults   PCCM, Pall care, Oncology, GI, CCS   Medications  Scheduled Meds: . cholecalciferol  1,000 Units Oral Daily  . digoxin  0.125 mg Intravenous Daily  . doxycycline  100 mg Oral Q12H  . feeding supplement (ENSURE COMPLETE)  237 mL Oral BID BM  . levothyroxine  37.5 mcg Intravenous Daily  .  metoprolol tartrate  25 mg Oral BID  . multivitamin with minerals  1 tablet Oral Daily  . simvastatin  20 mg Oral q1800   Continuous Infusions: . sodium chloride 20 mL/hr at 06/25/13 1611   PRN Meds:.acetaminophen, acetaminophen, albuterol, albuterol, fentaNYL, guaifenesin, lidocaine, lidocaine, metoprolol, midazolam, morphine injection, ondansetron (ZOFRAN) IV, ondansetron, phenylephrine  DVT Prophylaxis   SCDs   Lab Results  Component Value Date   PLT 181 06/24/2013    Antibiotics     Anti-infectives   Start     Dose/Rate Route  Frequency Ordered Stop   06/29/13 0800  doxycycline (VIBRA-TABS) tablet 100 mg     100 mg Oral Every 12 hours 06/29/13 0654            Subjective:   [name] today has, No headache, No chest pain, No abdominal pain - No Nausea, No new weakness tingling or numbness, No Cough - SOB.+ve dysphagia  Objective:   Filed Vitals:   06/28/13 0446 06/28/13 1525 06/28/13 2045 06/29/13 0618  BP: 135/78 126/78 139/74 158/80  Pulse: 78 64 68 76  Temp: 98.2 F (36.8 C) 98.7 F (37.1 C) 98.2 F (36.8 C) 98.7 F (37.1 C)  TempSrc: Oral Oral Oral Oral  Resp: 20 19 20 18   Height:      Weight:      SpO2: 90% 90% 99% 93%    Wt Readings from Last 3 Encounters:  06/21/13 111.3 kg (245 lb 6 oz)  06/21/13 111.3 kg (245 lb 6 oz)  05/12/13 101.606 kg (224 lb)     Intake/Output Summary (Last 24 hours) at 06/29/13 1007 Last data filed at 06/29/13 0914  Gross per 24 hour  Intake   1225 ml  Output   2900 ml  Net  -1675 ml     Physical Exam  Awake Alert, Oriented X 3, No new F.N deficits, Normal affect [place].AT,PERRAL Supple Neck,No JVD, No cervical lymphadenopathy appriciated.  Symmetrical Chest wall movement, Good air movement bilaterally, CTAB RRR,No Gallops,Rubs or new Murmurs, No Parasternal Heave +ve B.Sounds, Abd Soft, Non tender, No organomegaly appriciated, No rebound - guarding or rigidity. No Cyanosis, Clubbing or edema, No new Rash or bruise     Data Review   Micro Results Recent Results (from the past 240 hour(s))  CULTURE, BAL-QUANTITATIVE     Status: None   Collection Time    06/25/13  4:52 PM      Result Value Ref Range Status   Specimen Description BRONCHIAL ALVEOLAR LAVAGE   Final   Special Requests NONE   Final   Gram Stain     Final   Value: ABUNDANT WBC PRESENT, PREDOMINANTLY PMN     RARE SQUAMOUS EPITHELIAL CELLS PRESENT     FEW GRAM POSITIVE COCCI IN PAIRS AND CHAINS     Performed at [facility]     Final   Value: 80,000  COLONIES/ML     Performed at [facility]   Culture     Final   Value: STAPHYLOCOCCUS AUREUS     Note: RIFAMPIN AND GENTAMICIN SHOULD NOT BE USED AS SINGLE DRUGS FOR TREATMENT OF STAPH INFECTIONS.     Performed at [facility]   Report Status 06/29/2013 FINAL   Final   Organism ID, Bacteria STAPHYLOCOCCUS AUREUS   Final  AFB CULTURE WITH SMEAR     Status: None   Collection Time    06/25/13  4:52 PM  Result Value Ref Range Status   Specimen Description BRONCHIAL ALVEOLAR LAVAGE   Final   Special Requests NONE   Final   ACID FAST SMEAR     Final   Value: NO ACID FAST BACILLI SEEN     Performed at [facility]   Culture     Final   Value: CULTURE WILL BE EXAMINED FOR 6 WEEKS BEFORE ISSUING A FINAL REPORT     Performed at [facility]   Report Status PENDING   Incomplete  FUNGUS CULTURE W SMEAR     Status: None   Collection Time    06/25/13  4:52 PM      Result Value Ref Range Status   Specimen Description BRONCHIAL ALVEOLAR LAVAGE   Final   Special Requests NONE   Final   Fungal Smear     Final   Value: NO YEAST OR FUNGAL ELEMENTS SEEN     Performed at [facility]   Culture     Final   Value: CULTURE IN PROGRESS FOR FOUR WEEKS     Performed at [facility]   Report Status PENDING   Incomplete    Radiology Reports Ct Angio Chest Pe W/cm &/or Wo Cm  06/22/2013   ADDENDUM REPORT: 06/22/2013 10:45  ADDENDUM: The patient has a large left hilar mass that extends into the mediastinum into the subcarinal region. Its medial portion is inseparable from the abnormal soft tissues at the junction of the mid and distal thirds of the esophagus. It is this portion of the mass that abuts the anterior aspect of the abdominal aorta. The maximal dimensions of the subcarinal mass are approximately 6.4 cm transversely x 3.2 cm AP x 3.6 cm longitudinally.  In the left axillary region there is a borderline enlarged lymph node measuring 1.4 cm in  diameter.   Electronically Signed   By: David  Martinique   On: 06/22/2013 10:45   06/22/2013   CLINICAL DATA:  Hemoptysis, left lower extremity edema, recent hip surgery  EXAM: CT ANGIOGRAPHY CHEST WITH CONTRAST  TECHNIQUE: Multidetector CT imaging of the chest was performed using the standard protocol during bolus administration of intravenous contrast. Multiplanar CT image reconstructions and MIPs were obtained to evaluate the vascular anatomy.  CONTRAST:  178mL OMNIPAQUE IOHEXOL 350 MG/ML SOLN  COMPARISON:  DG CHEST 1V PORT dated 05/12/2013  FINDINGS: The contrast bolus is limited in terms of density. No definite intraluminal pulmonary artery defects are demonstrated. There is an abnormal mediastinal and left hilar mass. This is producing significant mass effect upon the proximal left main pulmonary artery, but flow distally is still demonstrated. There is also mass effect upon the esophagus with distention of the proximal esophagus to the level of the carina. There is an anterior left hilar soft tissue mass 3.2 cm transversely by 4.9 cm AP. There is a 2 cm right hilar lymph node on image 48. Definite paratracheal lymphadenopathy is not demonstrated. The cardiac chambers are mildly enlarged. There is no pericardial effusion. The caliber of the thoracic aorta is normal. These soft tissue mass abuts the anterior and left lateral surface of the descending thoracic aorta.  There is a small left pleural effusion. Patchy areas of increased parenchymal density are present in the left upper lobe. Confluent increased density peripherally in the left lower lobe is present. On the right there is subsegmental atelectasis or early infiltrate in the lower lobe. There are emphysematous changes in both lungs.  Within the upper  abdomen the observed portions of the liver and spleen appear normal. The adrenal glands are not included in the field of view. The thoracic vertebral bodies are preserved in height. The sternum appears intact.  The observed portions of the ribs exhibit no acute abnormalities.  Review of the MIP images confirms the above findings.  IMPRESSION: 1. There is an abnormal mediastinal and left hilar mass producing mass effect upon the left main pulmonary artery. Classic pulmonary emboli are not demonstrated but vascular flow to the left lung is markedly impaired by the large mass. This mass is also resulting in esophageal obstruction with the proximal and mid portions of the esophagus distended with fluid and gas. The appearance is consistent with a central lung malignancy or malignancy of the distal esophagus. 2. There is a small left pleural effusion. 3. There is postobstructive atelectasis in the left lower lobe. Patchy areas of interstitial density in the left upper lobe are nonspecific but could reflect parenchymal metastatic disease. There is likely atelectasis or early infiltrate in the right lower lobe. 4. These results were called by telephone at the time of interpretation on 06/21/2013 at 6:00 PM to Dr. Tanna Furry , who verbally acknowledged these results.  Electronically Signed: By: David  Martinique On: 06/21/2013 18:02   Dg Esophagus  06/23/2013   CLINICAL DATA:  Dysphagia. Blockage seen on modified barium swallow.  EXAM: ESOPHOGRAM/BARIUM SWALLOW  TECHNIQUE: Single contrast examination was performed using  thin barium.  COMPARISON:  Chest CT 06/21/2013  FLUOROSCOPY TIME:  1 min, 35 seconds  FINDINGS: Prior to the patient ingesting additional barium, fluoroscopy demonstrates retained food/debris as well as barium from a earlier modified barium swallow study. The patient was able to take several swallows of thin barium. This mixes with the foods/ debris in the obstructed proximal to mid esophagus, with irregular tapering just below the aortic arch compatible with near complete obstructing esophageal mass. A small amount of contrast passes through this area in into the distal esophagus and stomach.  IMPRESSION: Near  complete obstruction of the mid esophagus with irregular tapering of the esophagus at this level concerning for esophageal mass/ neoplasm.   Electronically Signed   By: Rolm Baptise M.D.   On: 06/23/2013 17:25   Dg Swallowing Func-speech Pathology  06/23/2013   Macario Golds, CCC-SLP     06/23/2013  4:27 PM Objective Swallowing Evaluation: Modified Barium Swallowing Study   Patient Details  Name: YAHYE SIEBERT MRN: [id number] Date of Birth: June 17, 1926  Today's Date: 06/23/2013 Time: 4562-5638 SLP Time Calculation (min): 52 min  Past Medical History:  Past Medical History  Diagnosis Date  . Hypertension   . Hyperlipidemia   . AAA (abdominal aortic aneurysm)   . Arthritis   . CAD (coronary artery disease)   . Thyroid disease     hypothyroidism  . Dysrhythmia     atrial flutter  . Shortness of breath     a little with exertion  . Polio 1934  . Atrial flutter     chronic anticoagulation  . COPD (chronic obstructive pulmonary disease)   . History of nuclear stress test 04/2010    dipyridamole; small, mostly fixed basal to mid inferior and  inferoseptal defect (gut artifact v. scar); post stress EF 62%;  non-diagnostic for ischemia; low risk    Past Surgical History:  Past Surgical History  Procedure Laterality Date  . Abdominal aortic aneurysm repair  05/21/2010    aorto bi-iliac [facility]  . Appendectomy  1949  .  Popliteal synovial cyst excision Left   . Joint replacement      Knee and Hip replacements  . Total hip arthroplasty Left 1994  . Knee arthroplasty Left 2004  . Total hip revision Left 12/04/2012    Procedure: LEFT TOTAL HIP REVISION;  Surgeon: Mauri Pole,  MD;  Location: [facility];  Service: Orthopedics;  Laterality: Left;  . Cardiac catheterization  2009    in [place]; 50-60% RCA  . Transthoracic echocardiogram  04/2010    EF =>55%; RV borderline dilated; LA mildly dilated; mild mitral  annular calcif & mild MR; mild TR; mild calcif of AV leaflets  with mild AV stenosis; aortic root sclerosis/calcif  . Orif femur  fracture Left 01/22/2013    Procedure: OPEN REDUCTION INTERNAL FIXATION (ORIF) DISTAL FEMUR  FRACTURE;  Surgeon: Mauri Pole, MD;  Location: [facility];   Service: Orthopedics;  Laterality: Left;   HPI:  80 yo male adm to [facility] with hemopytsis.  Pt found to have  subcarinal mass with esophageal obstruction proximal and mid with  gas noted, Chest imaging showed post obstructive atelectasis LLL,  ? RLL infection/ ATX.  Pt reports 43 pound weight loss since Oct  2014 and aphonia x 2 1/2 months with gradual onset and no  worsening.  Pt also with choking on food more than drink per his  report.  RN reports pt coughing with medicine.  BSE ordered.      Assessment / Plan / Recommendation Clinical Impression  Dysphagia Diagnosis: Mild oral phase dysphagia;Moderate  pharyngeal phase dysphagia;Suspected primary esophageal  dysphagia;Moderate cervical esophageal phase dysphagia   Clinical impression: Mild oral and moderate pharyngeal-cervical  esophageal dysphagia with suspected primary esophageal deficits.   Pt has sensorimotor deficits resulting in weak muscle contraction  and pharyngeal stasis without pt awareness.  Laryngeal elevation  was very poor negatively impacting airway protection and UES  opening.   Chin tuck posture along with dry swallows decreased  amount of pharyngeal stasis of liquids.  Head turn right with  chin tuck not effective.  Laryngeal penetration (silent) of small  amount of barium noted after swallow without pt awareness-  presumed from pyriform residuals spilling into open larynx.    Pt coughed during MBS without barium visualized in larynx -  suspect possible secretion aspiration occuring based on secretion  stasis in pharynx.    SLP only tested liquids due to appearance of stasis and narrowing  in mid-esophagus that did NOT clear with multiple swallows or  liquids. Diffiuclt situation due to multifactorial dysphagia as  pt with at risk of aspiration due to oropharyngeal weakness and  suspected  esophageal component (appearance of narrowing with  retained secretions above - ? from extrinsic compression from  mass.  Please note radiologist not present during MBS to confirm  findings.      Recommend medicine be given IV and pt be allowed SMALL amounts of  tsps of thin water (? clears) with chin tuck and dry swallows for  comfort.    Rec dc intake if pt coughing or senses stasis *he did not have  sensation to poor clearance during entire test.  Pt given oral  suction and SLP demonstrated use to help clear secretions from  pharynx if able to cough.  As pt reports h/o dysphagia and  coughing up secretions x3 weeks, suspect acute on chronic  dysphagia.       Treatment Recommendation  Therapy as outlined in treatment plan below    Diet  Recommendation  (? single tsps clears with chin tuck - SMALL  SINGLE TSPS ONLY)   Liquid Administration via: Spoon Medication Administration: Via alternative means Compensations: Multiple dry swallows after each bite/sip;Hard  cough after swallow Postural Changes and/or Swallow Maneuvers: Seated upright 90  degrees;Upright 30-60 min after meal;Chin tuck    Other  Recommendations Recommended Consults: MBS Oral Care Recommendations: Oral care Q4 per protocol   Follow Up Recommendations   ? Referral to address possible masses causing extrinsic  obstruction on esophagus   Frequency and Duration min 2x/week  2 weeks        General Date of Onset: 06/23/13 HPI: 80 yo male adm to [facility] with hemopytsis.  Pt found to have  subcarinal mass with esophageal obstruction proximal and mid with  gas noted, Chest imaging showed post obstructive atelectasis LLL,  ? RLL infection/ ATX.  Pt reports 43 pound weight loss since Oct  2014 and aphonia x 2 1/2 months with gradual onset and no  worsening.  Pt also with choking on food more than drink per his  report.  RN reports pt coughing with medicine.  BSE ordered.  Type of Study: Modified Barium Swallowing Study Reason for Referral: Objectively evaluate  swallowing function Diet Prior to this Study: NPO Temperature Spikes Noted: No Respiratory Status: Nasal cannula History of Recent Intubation: No Behavior/Cognition: Alert;Cooperative;Pleasant mood Oral Cavity - Dentition: Missing dentition (few teeth) Oral Motor / Sensory Function: Impaired - see Bedside swallow  eval Self-Feeding Abilities: Able to feed self Patient Positioning: Upright in chair Baseline Vocal Quality: Low vocal intensity;Breathy Volitional Cough: Weak Volitional Swallow: Able to elicit Pharyngeal Secretions: Standing secretions in (comment) (pharynx)     Reason for Referral Objectively evaluate swallowing function   Oral Phase Oral Preparation/Oral Phase Oral Phase: Impaired Oral - Nectar Oral - Nectar Teaspoon: Reduced posterior  propulsion;Lingual/palatal residue;Weak lingual manipulation Oral - Nectar Straw: Reduced posterior propulsion;Lingual/palatal  residue;Weak lingual manipulation Oral - Thin Oral - Thin Teaspoon: Reduced posterior  propulsion;Lingual/palatal residue;Weak lingual manipulation Oral - Thin Cup: Reduced posterior propulsion;Lingual/palatal  residue;Weak lingual manipulation Oral - Thin Straw: Reduced posterior propulsion;Lingual/palatal  residue;Weak lingual manipulation   Pharyngeal Phase Pharyngeal Phase Pharyngeal Phase: Impaired Pharyngeal - Nectar Pharyngeal - Nectar Teaspoon: Reduced epiglottic  inversion;Reduced pharyngeal peristalsis;Reduced anterior  laryngeal mobility;Reduced laryngeal elevation;Pharyngeal residue  - pyriform sinuses;Pharyngeal residue - valleculae;Trace  aspiration;Pharyngeal residue - cp segment Pharyngeal - Nectar Cup: Reduced epiglottic inversion;Reduced  pharyngeal peristalsis;Reduced anterior laryngeal  mobility;Reduced laryngeal elevation;Pharyngeal residue -  valleculae;Pharyngeal residue - pyriform sinuses;Reduced tongue  base retraction;Pharyngeal residue - cp  segment;Penetration/Aspiration after swallow Penetration/Aspiration details  (nectar cup): Material enters  airway, remains ABOVE vocal cords and not ejected out Pharyngeal - Thin Pharyngeal - Thin Teaspoon: Reduced epiglottic inversion;Reduced  pharyngeal peristalsis;Reduced anterior laryngeal  mobility;Reduced laryngeal elevation;Pharyngeal residue -  valleculae;Pharyngeal residue - pyriform sinuses;Reduced tongue  base retraction;Pharyngeal residue - cp segment Pharyngeal - Thin Cup: Reduced epiglottic inversion;Reduced  pharyngeal peristalsis;Reduced anterior laryngeal  mobility;Reduced laryngeal elevation;Pharyngeal residue -  valleculae;Pharyngeal residue - pyriform sinuses;Reduced tongue  base retraction;Pharyngeal residue - cp segment Pharyngeal - Thin Straw: Reduced epiglottic inversion;Reduced  pharyngeal peristalsis;Reduced anterior laryngeal  mobility;Reduced laryngeal elevation;Pharyngeal residue -  valleculae;Pharyngeal residue - pyriform sinuses;Reduced tongue  base retraction;Pharyngeal residue - cp segment Pharyngeal Phase - Comment Pharyngeal Comment: chin tuck with liquids decreases amount of  pharyngeal accumulation, cued dry swallows - pt able to cough and  "hock" up mild amount of stasis x1, does not sense pharygneal  stasis  Cervical Esophageal Phase    GO    Cervical Esophageal Phase Cervical Esophageal Phase: Impaired Cervical Esophageal Phase - Nectar Nectar Teaspoon: Reduced cricopharyngeal relaxation Nectar Cup: Reduced cricopharyngeal relaxation Cervical Esophageal Phase - Thin Thin Teaspoon: Reduced cricopharyngeal relaxation Thin Cup: Reduced cricopharyngeal relaxation Thin Straw: Reduced cricopharyngeal relaxation         Luanna Salk, MS [facility] SLP [phone]     CBC  Recent Labs Lab 06/24/13 0518  WBC 6.9  HGB 11.1*  HCT 32.9*  PLT 181  MCV 89.4  MCH 30.2  MCHC 33.7  RDW 16.9*    Chemistries  No results found for this basename: NA, K, CL, CO2, GLUCOSE, BUN, CREATININE, GFRCGP, CALCIUM, MG, AST, ALT, ALKPHOS, BILITOT,  in the last 168  hours ------------------------------------------------------------------------------------------------------------------ estimated creatinine clearance is 95.3 ml/min (by C-G formula based on Cr of 0.67). ------------------------------------------------------------------------------------------------------------------ No results found for this basename: HGBA1C,  in the last 72 hours ------------------------------------------------------------------------------------------------------------------ No results found for this basename: CHOL, HDL, LDLCALC, TRIG, CHOLHDL, LDLDIRECT,  in the last 72 hours ------------------------------------------------------------------------------------------------------------------ No results found for this basename: TSH, T4TOTAL, FREET3, T3FREE, THYROIDAB,  in the last 72 hours ------------------------------------------------------------------------------------------------------------------ No results found for this basename: VITAMINB12, FOLATE, FERRITIN, TIBC, IRON, RETICCTPCT,  in the last 72 hours  Coagulation profile No results found for this basename: INR, PROTIME,  in the last 168 hours  No results found for this basename: DDIMER,  in the last 72 hours  Cardiac Enzymes No results found for this basename: CK, CKMB, TROPONINI, MYOGLOBIN,  in the last 168 hours ------------------------------------------------------------------------------------------------------------------ No components found with this basename: POCBNP,      Time Spent in minutes   35   Geordan Xu K M.D on 06/29/2013 at 10:07 AM  Between 7am to 7pm - Pager - [phone]  After 7pm go to [email or web address] - password [email or web address]  And look for the night coverage person covering for me after hours  [facility] Office  [phone]

## 2013-06-29 NOTE — Progress Notes (Addendum)
   CARE MANAGEMENT NOTE 06/29/2013  Patient:  Austin [name], Austin [name]   Account Number:  [id number]  Date Initiated:  06/23/2013  Documentation initiated by:  Dessa Phi  Subjective/Objective Assessment:   80 Y/O M ADMITTED W/HEMOPTYSIS.     Action/Plan:   FROM HOME.HAS PCP,PHARMACY.   Anticipated DC Date:  06/29/2013   Anticipated DC Plan:  [place]  CM consult      PAC Choice  HOSPICE   Choice offered to / List presented to:  C-4 Adult Children        HH arranged  HH-1 RN      [place] agency  [facility]   Status of service:  Completed, signed off Medicare Important Message given?   (If response is "NO", the following Medicare IM given date fields will be blank) Date Medicare IM given:   Date Additional Medicare IM given:    Discharge Disposition:  [place]  Per UR Regulation:  Reviewed for med. necessity/level of care/duration of stay  If discussed at [place] of Stay Meetings, dates discussed:    Comments:  06/29/2013 0720 Faxed referral to [facility]. Jonnie Finner RN CCM Case Mgmt phone [phone]  Late entry  06/28/2013 1730 NCM spoke to pt and gave permission to speak to his son and dtr. Spoke to pt about desires for home and he wants Home Hospice. Provided pt with list of Home Hospice providers. Dtr requested Hospice and [place]. States their father's hospital bed is not long enough. They are requesting a longer bed. Hospital bed [facility] through [facility]. Will send message to [facility] rep for delivery of longer bed. Pt states he has w/c, RW, cane and oxygen at home. Will notify [facility] for referral. Jonnie Finner RN CCM Case Mgmt phone [phone]  06/25/2013 1545 NCM spoke to pt and gave permission to speak with son, Jasen # [phone] and Lillia Abed. Pt lives in home with Lillia Abed  # [phone], c- # [phone]. Children want to wait for final recommendations for home  after procedure today. Will speak to pt and family on 3/10 about [place] or Home Hospice. Jonnie Finner RN CCM Case Mgmt phone [phone]  06/24/2013 1120 Pt is active with [facility]. Jonnie Finner RN CCM Case Mgmt phone [phone]

## 2013-06-29 NOTE — Consult Note (Signed)
Radiation Oncology         [phone]) [phone] ________________________________  Name: Austin [name] MRN: [id number]  Date: 06/21/2013  DOB: 07-05-1926   DIAGNOSIS: Non-small cell lung cancer  HISTORY OF PRESENT ILLNESS::Austin [name] is a 80 y.o. male who is seen for an initial consultation visit. The patient was admitted to the hospital with a progressive cough and hemoptysis. He also has experienced progressive dysphagia over the last month. The patient underwent workup which has included a CT angiogram that revealed a large left hilar mass extending to the mediastinum in the subcarinal region. The medial aspect of this was inseparable from the esophagus at this location. The subcarinal mass measured 6.4 cm in maximum dimension. Borderline enlarged lymph node was present within the left axilla as well as a small left pleural effusion. Some postobstructive atelectasis is present within the left lower lobe. Notably the patient also underwent an MRI scan of the brain on 05/04/2013. No evidence of metastases at that time. The patient underwent bronchoscopy with bronchial brushings. This returned positive for malignant cells consistent with non-small cell lung cancer.  The patient therefore has a new diagnosis of non-small cell lung cancer. He has been seen by Dr. Chancy Milroy in medical oncology. The patient has indicated that he does not wish to pursue chemotherapy. I have been asked to see the patient today for consideration therefore of palliative radiation treatment.   PREVIOUS RADIATION THERAPY: No   PAST MEDICAL HISTORY:  has a past medical history of Hypertension; Hyperlipidemia; AAA (abdominal aortic aneurysm); Arthritis; CAD (coronary artery disease); Thyroid disease; Dysrhythmia; Shortness of breath; Polio (1934); Atrial flutter; COPD (chronic obstructive pulmonary disease); and History of nuclear stress test (04/2010).     PAST SURGICAL HISTORY: Past Surgical History  Procedure Laterality  Date  . Abdominal aortic aneurysm repair  05/21/2010    aorto bi-iliac BPG  . Appendectomy  1949  . Popliteal synovial cyst excision Left   . Joint replacement      Knee and Hip replacements  . Total hip arthroplasty Left 1994  . Knee arthroplasty Left 2004  . Total hip revision Left 12/04/2012    Procedure: LEFT TOTAL HIP REVISION;  Surgeon: Mauri Pole, MD;  Location: [facility];  Service: Orthopedics;  Laterality: Left;  . Cardiac catheterization  2009    in [place]; 50-60% RCA  . Transthoracic echocardiogram  04/2010    EF =>55%; RV borderline dilated; LA mildly dilated; mild mitral annular calcif & mild MR; mild TR; mild calcif of AV leaflets with mild AV stenosis; aortic root sclerosis/calcif  . Orif femur fracture Left 01/22/2013    Procedure: OPEN REDUCTION INTERNAL FIXATION (ORIF) DISTAL FEMUR FRACTURE;  Surgeon: Mauri Pole, MD;  Location: [facility];  Service: Orthopedics;  Laterality: Left;  . Video bronchoscopy Bilateral 06/25/2013    Procedure: VIDEO BRONCHOSCOPY WITHOUT FLUORO;  Surgeon: Rush Farmer, MD;  Location: [facility];  Service: Cardiopulmonary;  Laterality: Bilateral;     FAMILY HISTORY: family history includes AAA (abdominal aortic aneurysm) in his son; Hyperlipidemia in his son; Hypertension in his son; Lung cancer in his brother; Other in his father.   SOCIAL HISTORY:  reports that he quit smoking about 47 years ago. His smoking use included Cigarettes. He has a 25 pack-year smoking history. He has never used smokeless tobacco. He reports that he drinks alcohol. He reports that he does not use illicit drugs.   ALLERGIES: Review of patient's allergies indicates no known allergies.  MEDICATIONS:  Current Facility-Administered Medications  Medication Dose Route Frequency Provider Last Rate Last Dose  . 0.9 %  sodium chloride infusion   Intravenous Continuous Rush Farmer, MD 20 mL/hr at 06/25/13 1611    . acetaminophen (TYLENOL) suppository 650 mg  650 mg Rectal  Q6H PRN [name], MD      . albuterol (PROVENTIL) (2.5 MG/3ML) 0.083% nebulizer solution 2.5 mg  2.5 mg Nebulization Q6H PRN [name], MD   2.5 mg at 06/21/13 2146  . albuterol (PROVENTIL) (2.5 MG/3ML) 0.083% nebulizer solution 2.5 mg  2.5 mg Nebulization Q2H PRN [name], MD   2.5 mg at 06/24/13 1114  . digoxin (LANOXIN) 0.25 MG/ML injection 0.125 mg  0.125 mg Intravenous Daily Kinnie Feil, MD   0.125 mg at 06/29/13 1350  . doxycycline (VIBRAMYCIN) 100 mg in dextrose 5 % 250 mL IVPB  100 mg Intravenous Q12H Thurnell Lose, MD   100 mg at 06/29/13 1351  . feeding supplement (ENSURE COMPLETE) (ENSURE COMPLETE) liquid 237 mL  237 mL Oral BID PRN Christie Beckers, RD      . fentaNYL (SUBLIMAZE) injection    PRN Rush Farmer, MD   25 mcg at 06/25/13 1642  . guaifenesin (ROBITUSSIN) 100 MG/5ML syrup 200 mg  200 mg Oral Q4H PRN Rhetta Mura Schorr, NP   200 mg at 06/29/13 0404  . levothyroxine (SYNTHROID, LEVOTHROID) injection 37.5 mcg  37.5 mcg Intravenous Daily Knox Royalty, NP   37.5 mcg at 06/29/13 1350  . lidocaine (XYLOCAINE) 1 % (with pres) injection    PRN Rush Farmer, MD   6 mL at 06/25/13 1542  . lidocaine (XYLOCAINE) 2 % jelly    PRN Rush Farmer, MD   1 application at 40/98/11 1541  . metoprolol (LOPRESSOR) injection 2.5 mg  2.5 mg Intravenous Q6H PRN Kinnie Feil, MD      . midazolam (VERSED) injection    PRN Rush Farmer, MD   1 mg at 06/25/13 1622  . morphine 2 MG/ML injection 2 mg  2 mg Intravenous Q4H PRN Kinnie Feil, MD      . ondansetron (ZOFRAN) injection 4 mg  4 mg Intravenous Q6H PRN [name], MD   4 mg at 06/24/13 2123  . phenylephrine (NEO-SYNEPHRINE) 0.25 % nasal spray    PRN Rush Farmer, MD   1 spray at 06/25/13 1540     REVIEW OF SYSTEMS:  A 15 point review of systems is documented in the electronic medical record. This was obtained by the nursing staff. However, I reviewed this with the patient to discuss relevant  findings and make appropriate changes.  Pertinent items are noted in HPI.    PHYSICAL EXAM:  height is 6\' 5"  (1.956 m) and weight is 245 lb 6 oz (111.3 kg). His oral temperature is 98.5 F (36.9 C). His blood pressure is 156/75 and his pulse is 95. His respiration is 18 and oxygen saturation is 94%.   ECOG = 3  0 - Asymptomatic (Fully active, able to carry on all predisease activities without restriction)  1 - Symptomatic but completely ambulatory (Restricted in physically strenuous activity but ambulatory and able to carry out work of a light or sedentary nature. For example, light housework, office work)  2 - Symptomatic, <50% in bed during the day (Ambulatory and capable of all self care but unable to carry out any work activities. Up and about more than 50% of  waking hours)  3 - Symptomatic, >50% in bed, but not bedbound (Capable of only limited self-care, confined to bed or chair 50% or more of waking hours)  4 - Bedbound (Completely disabled. Cannot carry on any self-care. Totally confined to bed or chair)  5 - Death   Eustace Pen MM, Creech RH, Tormey DC, et al. [phone]). "Toxicity and response criteria of the [facility]". [place] Oncol. 5 (6): 649-55  General: the patient is lying in a hospital bed, some hoarseness. He is on supplemental oxygen.  HEENT: Normocephalic, atraumatic Cardiovascular: Regular rate and rhythm Respiratory: Clear to auscultation bilaterally GI: Soft, nontender, normal bowel sounds Extremities: No edema present    LABORATORY DATA:  Lab Results  Component Value Date   WBC 6.9 06/24/2013   HGB 11.1* 06/24/2013   HCT 32.9* 06/24/2013   MCV 89.4 06/24/2013   PLT 181 06/24/2013   Lab Results  Component Value Date   NA 138 06/22/2013   K 3.8 06/22/2013   CL 99 06/22/2013   CO2 27 06/22/2013   Lab Results  Component Value Date   ALT 17 06/22/2013   AST 16 06/22/2013   ALKPHOS 136* 06/22/2013   BILITOT 0.7 06/22/2013      RADIOGRAPHY: Ct Angio  Chest Pe W/cm &/or Wo Cm  06/22/2013   ADDENDUM REPORT: 06/22/2013 10:45  ADDENDUM: The patient has a large left hilar mass that extends into the mediastinum into the subcarinal region. Its medial portion is inseparable from the abnormal soft tissues at the junction of the mid and distal thirds of the esophagus. It is this portion of the mass that abuts the anterior aspect of the abdominal aorta. The maximal dimensions of the subcarinal mass are approximately 6.4 cm transversely x 3.2 cm AP x 3.6 cm longitudinally.  In the left axillary region there is a borderline enlarged lymph node measuring 1.4 cm in diameter.   Electronically Signed   By: David  Martinique   On: 06/22/2013 10:45   06/22/2013   CLINICAL DATA:  Hemoptysis, left lower extremity edema, recent hip surgery  EXAM: CT ANGIOGRAPHY CHEST WITH CONTRAST  TECHNIQUE: Multidetector CT imaging of the chest was performed using the standard protocol during bolus administration of intravenous contrast. Multiplanar CT image reconstructions and MIPs were obtained to evaluate the vascular anatomy.  CONTRAST:  18mL OMNIPAQUE IOHEXOL 350 MG/ML SOLN  COMPARISON:  DG CHEST 1V PORT dated 05/12/2013  FINDINGS: The contrast bolus is limited in terms of density. No definite intraluminal pulmonary artery defects are demonstrated. There is an abnormal mediastinal and left hilar mass. This is producing significant mass effect upon the proximal left main pulmonary artery, but flow distally is still demonstrated. There is also mass effect upon the esophagus with distention of the proximal esophagus to the level of the carina. There is an anterior left hilar soft tissue mass 3.2 cm transversely by 4.9 cm AP. There is a 2 cm right hilar lymph node on image 48. Definite paratracheal lymphadenopathy is not demonstrated. The cardiac chambers are mildly enlarged. There is no pericardial effusion. The caliber of the thoracic aorta is normal. These soft tissue mass abuts the anterior and  left lateral surface of the descending thoracic aorta.  There is a small left pleural effusion. Patchy areas of increased parenchymal density are present in the left upper lobe. Confluent increased density peripherally in the left lower lobe is present. On the right there is subsegmental atelectasis or early infiltrate in the lower  lobe. There are emphysematous changes in both lungs.  Within the upper abdomen the observed portions of the liver and spleen appear normal. The adrenal glands are not included in the field of view. The thoracic vertebral bodies are preserved in height. The sternum appears intact. The observed portions of the ribs exhibit no acute abnormalities.  Review of the MIP images confirms the above findings.  IMPRESSION: 1. There is an abnormal mediastinal and left hilar mass producing mass effect upon the left main pulmonary artery. Classic pulmonary emboli are not demonstrated but vascular flow to the left lung is markedly impaired by the large mass. This mass is also resulting in esophageal obstruction with the proximal and mid portions of the esophagus distended with fluid and gas. The appearance is consistent with a central lung malignancy or malignancy of the distal esophagus. 2. There is a small left pleural effusion. 3. There is postobstructive atelectasis in the left lower lobe. Patchy areas of interstitial density in the left upper lobe are nonspecific but could reflect parenchymal metastatic disease. There is likely atelectasis or early infiltrate in the right lower lobe. 4. These results were called by telephone at the time of interpretation on 06/21/2013 at 6:00 PM to Dr. Tanna Furry , who verbally acknowledged these results.  Electronically Signed: By: David  Martinique On: 06/21/2013 18:02   Dg Esophagus  06/23/2013   CLINICAL DATA:  Dysphagia. Blockage seen on modified barium swallow.  EXAM: ESOPHOGRAM/BARIUM SWALLOW  TECHNIQUE: Single contrast examination was performed using  thin  barium.  COMPARISON:  Chest CT 06/21/2013  FLUOROSCOPY TIME:  1 min, 35 seconds  FINDINGS: Prior to the patient ingesting additional barium, fluoroscopy demonstrates retained food/debris as well as barium from a earlier modified barium swallow study. The patient was able to take several swallows of thin barium. This mixes with the foods/ debris in the obstructed proximal to mid esophagus, with irregular tapering just below the aortic arch compatible with near complete obstructing esophageal mass. A small amount of contrast passes through this area in into the distal esophagus and stomach.  IMPRESSION: Near complete obstruction of the mid esophagus with irregular tapering of the esophagus at this level concerning for esophageal mass/ neoplasm.   Electronically Signed   By: Rolm Baptise M.D.   On: 06/23/2013 17:25   Dg Swallowing Func-speech Pathology  06/23/2013   Macario Golds, CCC-SLP     06/23/2013  4:27 PM Objective Swallowing Evaluation: Modified Barium Swallowing Study   Patient Details  Name: JESIEL GARATE MRN: [id number] Date of Birth: Oct 02, 1926  Today's Date: 06/23/2013 Time: 5643-3295 SLP Time Calculation (min): 52 min  Past Medical History:  Past Medical History  Diagnosis Date  . Hypertension   . Hyperlipidemia   . AAA (abdominal aortic aneurysm)   . Arthritis   . CAD (coronary artery disease)   . Thyroid disease     hypothyroidism  . Dysrhythmia     atrial flutter  . Shortness of breath     a little with exertion  . Polio 1934  . Atrial flutter     chronic anticoagulation  . COPD (chronic obstructive pulmonary disease)   . History of nuclear stress test 04/2010    dipyridamole; small, mostly fixed basal to mid inferior and  inferoseptal defect (gut artifact v. scar); post stress EF 62%;  non-diagnostic for ischemia; low risk    Past Surgical History:  Past Surgical History  Procedure Laterality Date  . Abdominal aortic aneurysm repair  05/21/2010  aorto bi-iliac BPG  . Appendectomy  1949  .  Popliteal synovial cyst excision Left   . Joint replacement      Knee and Hip replacements  . Total hip arthroplasty Left 1994  . Knee arthroplasty Left 2004  . Total hip revision Left 12/04/2012    Procedure: LEFT TOTAL HIP REVISION;  Surgeon: Mauri Pole,  MD;  Location: [facility];  Service: Orthopedics;  Laterality: Left;  . Cardiac catheterization  2009    in [place]; 50-60% RCA  . Transthoracic echocardiogram  04/2010    EF =>55%; RV borderline dilated; LA mildly dilated; mild mitral  annular calcif & mild MR; mild TR; mild calcif of AV leaflets  with mild AV stenosis; aortic root sclerosis/calcif  . Orif femur fracture Left 01/22/2013    Procedure: OPEN REDUCTION INTERNAL FIXATION (ORIF) DISTAL FEMUR  FRACTURE;  Surgeon: Mauri Pole, MD;  Location: [facility];   Service: Orthopedics;  Laterality: Left;   HPI:  80 yo male adm to [facility] with hemopytsis.  Pt found to have  subcarinal mass with esophageal obstruction proximal and mid with  gas noted, Chest imaging showed post obstructive atelectasis LLL,  ? RLL infection/ ATX.  Pt reports 43 pound weight loss since Oct  2014 and aphonia x 2 1/2 months with gradual onset and no  worsening.  Pt also with choking on food more than drink per his  report.  RN reports pt coughing with medicine.  BSE ordered.      Assessment / Plan / Recommendation Clinical Impression  Dysphagia Diagnosis: Mild oral phase dysphagia;Moderate  pharyngeal phase dysphagia;Suspected primary esophageal  dysphagia;Moderate cervical esophageal phase dysphagia   Clinical impression: Mild oral and moderate pharyngeal-cervical  esophageal dysphagia with suspected primary esophageal deficits.   Pt has sensorimotor deficits resulting in weak muscle contraction  and pharyngeal stasis without pt awareness.  Laryngeal elevation  was very poor negatively impacting airway protection and UES  opening.   Chin tuck posture along with dry swallows decreased  amount of pharyngeal stasis of liquids.  Head turn right with   chin tuck not effective.  Laryngeal penetration (silent) of small  amount of barium noted after swallow without pt awareness-  presumed from pyriform residuals spilling into open larynx.    Pt coughed during MBS without barium visualized in larynx -  suspect possible secretion aspiration occuring based on secretion  stasis in pharynx.    SLP only tested liquids due to appearance of stasis and narrowing  in mid-esophagus that did NOT clear with multiple swallows or  liquids. Diffiuclt situation due to multifactorial dysphagia as  pt with at risk of aspiration due to oropharyngeal weakness and  suspected esophageal component (appearance of narrowing with  retained secretions above - ? from extrinsic compression from  mass.  Please note radiologist not present during MBS to confirm  findings.      Recommend medicine be given IV and pt be allowed SMALL amounts of  tsps of thin water (? clears) with chin tuck and dry swallows for  comfort.    Rec dc intake if pt coughing or senses stasis *he did not have  sensation to poor clearance during entire test.  Pt given oral  suction and SLP demonstrated use to help clear secretions from  pharynx if able to cough.  As pt reports h/o dysphagia and  coughing up secretions x3 weeks, suspect acute on chronic  dysphagia.       Treatment Recommendation  Therapy  as outlined in treatment plan below    Diet Recommendation  (? single tsps clears with chin tuck - SMALL  SINGLE TSPS ONLY)   Liquid Administration via: Spoon Medication Administration: Via alternative means Compensations: Multiple dry swallows after each bite/sip;Hard  cough after swallow Postural Changes and/or Swallow Maneuvers: Seated upright 90  degrees;Upright 30-60 min after meal;Chin tuck    Other  Recommendations Recommended Consults: MBS Oral Care Recommendations: Oral care Q4 per protocol   Follow Up Recommendations   ? Referral to address possible masses causing extrinsic  obstruction on esophagus   Frequency and  Duration min 2x/week  2 weeks        General Date of Onset: 06/23/13 HPI: 80 yo male adm to [facility] with hemopytsis.  Pt found to have  subcarinal mass with esophageal obstruction proximal and mid with  gas noted, Chest imaging showed post obstructive atelectasis LLL,  ? RLL infection/ ATX.  Pt reports 43 pound weight loss since Oct  2014 and aphonia x 2 1/2 months with gradual onset and no  worsening.  Pt also with choking on food more than drink per his  report.  RN reports pt coughing with medicine.  BSE ordered.  Type of Study: Modified Barium Swallowing Study Reason for Referral: Objectively evaluate swallowing function Diet Prior to this Study: NPO Temperature Spikes Noted: No Respiratory Status: Nasal cannula History of Recent Intubation: No Behavior/Cognition: Alert;Cooperative;Pleasant mood Oral Cavity - Dentition: Missing dentition (few teeth) Oral Motor / Sensory Function: Impaired - see Bedside swallow  eval Self-Feeding Abilities: Able to feed self Patient Positioning: Upright in chair Baseline Vocal Quality: Low vocal intensity;Breathy Volitional Cough: Weak Volitional Swallow: Able to elicit Pharyngeal Secretions: Standing secretions in (comment) (pharynx)     Reason for Referral Objectively evaluate swallowing function   Oral Phase Oral Preparation/Oral Phase Oral Phase: Impaired Oral - Nectar Oral - Nectar Teaspoon: Reduced posterior  propulsion;Lingual/palatal residue;Weak lingual manipulation Oral - Nectar Straw: Reduced posterior propulsion;Lingual/palatal  residue;Weak lingual manipulation Oral - Thin Oral - Thin Teaspoon: Reduced posterior  propulsion;Lingual/palatal residue;Weak lingual manipulation Oral - Thin Cup: Reduced posterior propulsion;Lingual/palatal  residue;Weak lingual manipulation Oral - Thin Straw: Reduced posterior propulsion;Lingual/palatal  residue;Weak lingual manipulation   Pharyngeal Phase Pharyngeal Phase Pharyngeal Phase: Impaired Pharyngeal - Nectar Pharyngeal - Nectar  Teaspoon: Reduced epiglottic  inversion;Reduced pharyngeal peristalsis;Reduced anterior  laryngeal mobility;Reduced laryngeal elevation;Pharyngeal residue  - pyriform sinuses;Pharyngeal residue - valleculae;Trace  aspiration;Pharyngeal residue - cp segment Pharyngeal - Nectar Cup: Reduced epiglottic inversion;Reduced  pharyngeal peristalsis;Reduced anterior laryngeal  mobility;Reduced laryngeal elevation;Pharyngeal residue -  valleculae;Pharyngeal residue - pyriform sinuses;Reduced tongue  base retraction;Pharyngeal residue - cp  segment;Penetration/Aspiration after swallow Penetration/Aspiration details (nectar cup): Material enters  airway, remains ABOVE vocal cords and not ejected out Pharyngeal - Thin Pharyngeal - Thin Teaspoon: Reduced epiglottic inversion;Reduced  pharyngeal peristalsis;Reduced anterior laryngeal  mobility;Reduced laryngeal elevation;Pharyngeal residue -  valleculae;Pharyngeal residue - pyriform sinuses;Reduced tongue  base retraction;Pharyngeal residue - cp segment Pharyngeal - Thin Cup: Reduced epiglottic inversion;Reduced  pharyngeal peristalsis;Reduced anterior laryngeal  mobility;Reduced laryngeal elevation;Pharyngeal residue -  valleculae;Pharyngeal residue - pyriform sinuses;Reduced tongue  base retraction;Pharyngeal residue - cp segment Pharyngeal - Thin Straw: Reduced epiglottic inversion;Reduced  pharyngeal peristalsis;Reduced anterior laryngeal  mobility;Reduced laryngeal elevation;Pharyngeal residue -  valleculae;Pharyngeal residue - pyriform sinuses;Reduced tongue  base retraction;Pharyngeal residue - cp segment Pharyngeal Phase - Comment Pharyngeal Comment: chin tuck with liquids decreases amount of  pharyngeal accumulation, cued dry swallows - pt able to cough and  "hock" up  mild amount of stasis x1, does not sense pharygneal  stasis  Cervical Esophageal Phase    GO    Cervical Esophageal Phase Cervical Esophageal Phase: Impaired Cervical Esophageal Phase - Nectar Nectar  Teaspoon: Reduced cricopharyngeal relaxation Nectar Cup: Reduced cricopharyngeal relaxation Cervical Esophageal Phase - Thin Thin Teaspoon: Reduced cricopharyngeal relaxation Thin Cup: Reduced cricopharyngeal relaxation Thin Straw: Reduced cricopharyngeal relaxation         Luanna Salk, MS [facility] SLP [phone]        IMPRESSION: The patient has a new diagnosis of non-small cell lung cancer. He is somewhat ill appearing today given the decline in performance status treatment in large part it appears from poor nutrition. The patient's main complaint is an inability to swallow. He has been swallowing some liquids during his hospital stay. The patient's expressed his goals of care focusing on quality of life. He does not wish to pursue chemotherapy but he was interested in discussing possible radiation treatment.  I discussed with him a possible course of palliative radiation. The patient does not have metastatic disease evident thus far on his workup. However he does not in my opinion require further imaging such as a CT scan of the abdomen/pelvis if he wishes to pursue a palliative course. I would image him therefore based on symptoms. Nevertheless the patient does not have evidence of metastatic disease at this time. I therefore discussed with him a possible two-week course of palliative radiation treatment. I discussed with him the potential improvement in swallowing as well as the likely time frame of such improvement. This would not be immediate and the patient probably would not have schedule him for within the first week of treatment. This may length and his life but our discussion given his goals focused on quality of life. We also had a very frank discussion about the logistics of treatment which would be required as well as the possible side effects and risks of treatment as well. The patient was accompanied today by multiple family members who also persists has abated extensively in this  conversation.  At the end of this discussion the patient stated that he wished to begin radiation treatment. He felt that this was consistent with his goals of treatment and he expressed an understanding of the issues. He is aware that there is not a guarantee in terms of improvement in swallowing.   PLAN: The patient will proceed with a simulation today and we will begin his treatment on Monday. I will plan for 10 fractions over 2 weeks. This can began as an inpatient and potentially continue as an outpatient. His family is very engaged and willing to try this. Hopefully his swallowing will improve and his nutritional status will be adequate when he transitioned. I discussed with them that we could give some IV fluids as an outpatient in clinic if appropriate.      ________________________________   Jodelle Gross, MD, PhD  and

## 2013-06-29 NOTE — Progress Notes (Signed)
Progress Note from the Palliative Medicine Team at [place]:  -patient is weak, speech is thick with secretions (difficult to clear)  -family gathered at bedside for continued conversation regarding [place] and options  -detailed conversation regarding logistics of anticipatory care needs, financial implications, realistic impact of offered treatments on prognosis and quality of life.     Objective: No Known Allergies Scheduled Meds: . cholecalciferol  1,000 Units Oral Daily  . digoxin  0.125 mg Intravenous Daily  . doxycycline  100 mg Oral Q12H  . feeding supplement (ENSURE COMPLETE)  237 mL Oral BID BM  . levothyroxine  37.5 mcg Intravenous Daily  . metoprolol tartrate  25 mg Oral BID  . multivitamin with minerals  1 tablet Oral Daily  . simvastatin  20 mg Oral q1800   Continuous Infusions: . sodium chloride 20 mL/hr at 06/25/13 1611   PRN Meds:.acetaminophen, acetaminophen, albuterol, albuterol, fentaNYL, guaifenesin, lidocaine, lidocaine, metoprolol, midazolam, morphine injection, ondansetron (ZOFRAN) IV, ondansetron, phenylephrine  BP 158/80  Pulse 76  Temp(Src) 98.7 F (37.1 C) (Oral)  Resp 18  Ht 6\' 5"  (1.956 m)  Wt 111.3 kg (245 lb 6 oz)  BMI 29.09 kg/m2  SpO2 93%   PPS: 20 %     Intake/Output Summary (Last 24 hours) at 06/29/13 1012 Last data filed at 06/29/13 0914  Gross per 24 hour  Intake   1225 ml  Output   2900 ml  Net  -1675 ml       Physical Exam:  General: chronically ill appearing, NAD HEENT:  Mm, no exudate noted, audible throat secretions Constant cough to clear throat, patient couched and gagged up what appears to be lung tissue Chest:   CTA CVS: RRR Abdomen:soft NT +BS Ext: without edema Neuro: alert and oriented X3  Labs: CBC    Component Value Date/Time   WBC 6.9 06/24/2013 0518   RBC 3.68* 06/24/2013 0518   HGB 11.1* 06/24/2013 0518   HCT 32.9* 06/24/2013 0518   PLT 181 06/24/2013 0518   MCV 89.4 06/24/2013 0518   MCH  30.2 06/24/2013 0518   MCHC 33.7 06/24/2013 0518   RDW 16.9* 06/24/2013 0518   LYMPHSABS 2.1 06/21/2013 1640   MONOABS 0.7 06/21/2013 1640   EOSABS 0.1 06/21/2013 1640   BASOSABS 0.0 06/21/2013 1640    BMET    Component Value Date/Time   NA 138 06/22/2013 0440   K 3.8 06/22/2013 0440   CL 99 06/22/2013 0440   CO2 27 06/22/2013 0440   GLUCOSE 97 06/22/2013 0440   BUN 13 06/22/2013 0440   CREATININE 0.67 06/22/2013 0440   CALCIUM 9.9 06/22/2013 0440   GFRNONAA 86* 06/22/2013 0440   GFRAA >90 06/22/2013 0440    CMP     Component Value Date/Time   NA 138 06/22/2013 0440   K 3.8 06/22/2013 0440   CL 99 06/22/2013 0440   CO2 27 06/22/2013 0440   GLUCOSE 97 06/22/2013 0440   BUN 13 06/22/2013 0440   CREATININE 0.67 06/22/2013 0440   CALCIUM 9.9 06/22/2013 0440   PROT 6.2 06/22/2013 0440   ALBUMIN 2.6* 06/22/2013 0440   AST 16 06/22/2013 0440   ALT 17 06/22/2013 0440   ALKPHOS 136* 06/22/2013 0440   BILITOT 0.7 06/22/2013 0440   GFRNONAA 86* 06/22/2013 0440   GFRAA >90 06/22/2013 0440     Assessment and Plan: 1. Code Status:  DNR/DNI 2. Symptom Control: --Dysphagia: Sips as tolerated/ chew and spit/ aspiration precautions 3. Psycho/Social: Emotional  support offered to patient and family.  This has been emotionally draining for this family 4. Spiritual: Strong community church support, Tomasita Crumble priest at bedside, patient is receiving the Sacrament of the Sick 5. Disposition:  Dependant on outcomes and recommendations  Patient Documents Completed or Given: Document Given Completed  Advanced Directives Pkt    MOST yes   DNR    Gone from My Sight    Hard Choices yes     Time In Time Out Total Time Spent with Patient Total Overall Time  0900 1030 80 min 90 min    Greater than 50%  of this time was spent counseling and coordinating care related to the above assessment and plan.  Wadie Lessen NP  Palliative Medicine Team Team Phone # [phone] Pager [phone]  Discussed with Dr Candiss Norse, Dr Lisbeth Renshaw  PMT will continue to support  holistically  1

## 2013-06-29 NOTE — Progress Notes (Signed)
Notified of request for [facility] services/information; received call from [place] to inform family requesting writer meet with them on Monday; Probation officer contacted daughter Nunzio Cory [phone] and confirmed will meet with family Monday 07/02/13 @ 10-10:30 am  Danton Sewer, [facility] Liaison [phone]

## 2013-06-30 NOTE — Progress Notes (Addendum)
Patient Demographics  Austin [name], is a 80 y.o. male, DOB - January 26, 1927, QBH:[id number]  Admit date - 06/21/2013   Admitting Physician [name], MD  Outpatient Primary MD for the patient is [name], MD  LOS - 9   Chief Complaint  Patient presents with  . Hematemesis        Assessment & Plan    80 y/o male with PMH of HTN, COPD, HPL, A fib, CVA, recent sepsis, recurrent pneumonia, c diff presented with hemoptysis found to have lung, esophageal mass.      1. Hemoptysis/Lung mass likely lung CA (+tobacco use); non-small cell lung cancer with either metastases to the esophagus or compressing esophagus - CT: abnormal mediastinal and left hilar mass producing mass effect upon the left main pulmonary artery mass is also resulting in esophageal obstruction with the proximal and mid portions of the esophagus seen by oncology, pulmonary and radiation oncology, status post bronchoscopy on 06/25/2013, now a candidate for comfort care and palliative care. Palliative radiation treatments to be started on 07/02/2013, family and patient understand that these are only palliative in nature and patient is expected to pass away soon.[name]    MSSA in Bronch washing recent H/O of Asp.PNA - doxy IV for now.     2. Dysphagia due esophageal mass lesion: failed swallow evaluation; at risk for aspiration, recent h/p aspiration PNA, esophagogram shows Near complete obstruction of the mid esophagus with irregular tapering of the esophagus atthis level concerning for esophageal mass/ neoplasm. Per GI hold EGD; ? Need stenting (if done will need tracheal stenting as well), palliative radiation treatments in hopes of shrinking the mass in relieving some obstruction for comfort feeds.     3. Atrial  fibrillation: rate Controlled, continue with metoprolol and digoxin, hold anticoagulation it due to to #1      4. Hypertension: Acceptable continue with metoprolol     5. Hyperlipedemia: Statin discontinued as goal of care now comfort.    6. COPD; h/o tobacco use; no wheezing on exam; cont bronchodilators as needed      7. History of CVA holding Xarelto due to hemoptysis;      8. History of recent left hip repair  per notes: Dr. Alvan Dame on 1/28-okay for 50% weightbearing on the left lower extremity. Patient to followup with Dr. Alvan Dame in in one month for repeat x-rays, if continues to do well with good healing, patient will likely be advanced to more weightbearing status  -xray: 1/28: Healing fracture involving the distal shaft of the left femur status post sideplate and screw device reduction. He remains severely deconditioned and requiring 2 person assistance getting out of the bed.       Prognosis is guarded,  Goal of care is comfort with gentle medical treatment and palliative radiation treatments.      Code Status: DNR  Family Communication:  Updated whole family bedside 06/29/2013  Disposition Plan:  TBD   Procedures  bronchoscopy 3-9, CT scan chest, barium swallow   Consults   PCCM, Pall care, Oncology, GI, CCS   Medications  Scheduled Meds: . digoxin  0.125 mg Intravenous Daily  . doxycycline (VIBRAMYCIN) IV  100 mg Intravenous Q12H  . levothyroxine  37.5 mcg Intravenous Daily  Continuous Infusions: . sodium chloride 20 mL/hr at 06/25/13 1611   PRN Meds:.acetaminophen, albuterol, albuterol, feeding supplement (ENSURE COMPLETE), fentaNYL, guaifenesin, lidocaine, lidocaine, metoprolol, midazolam, morphine injection, ondansetron (ZOFRAN) IV, phenylephrine  DVT Prophylaxis   SCDs   Lab Results  Component Value Date   PLT 181 06/24/2013    Antibiotics     Anti-infectives   Start     Dose/Rate Route Frequency Ordered Stop   06/29/13 1045  doxycycline  (VIBRAMYCIN) 100 mg in dextrose 5 % 250 mL IVPB     100 mg 125 mL/hr over 120 Minutes Intravenous Every 12 hours 06/29/13 1036     06/29/13 0800  doxycycline (VIBRA-TABS) tablet 100 mg  Status:  Discontinued     100 mg Oral Every 12 hours 06/29/13 0654 06/29/13 1036          Subjective:   Austin [name] today has, No headache, No chest pain, No abdominal pain - No Nausea, No new weakness tingling or numbness, No Cough - SOB.+ve dysphagia  Objective:   Filed Vitals:   06/29/13 0618 06/29/13 1433 06/29/13 2126 06/30/13 0622  BP: 158/80 158/75 156/75 125/72  Pulse: 76 83 95 79  Temp: 98.7 F (37.1 C) 97.9 F (36.6 C) 98.5 F (36.9 C) 97.4 F (36.3 C)  TempSrc: Oral Oral Oral Oral  Resp: 18 18 18 18   Height:      Weight:      SpO2: 93% 98% 94% 94%    Wt Readings from Last 3 Encounters:  06/21/13 111.3 kg (245 lb 6 oz)  06/21/13 111.3 kg (245 lb 6 oz)  05/12/13 101.606 kg (224 lb)     Intake/Output Summary (Last 24 hours) at 06/30/13 1034 Last data filed at 06/30/13 0622  Gross per 24 hour  Intake    250 ml  Output   2500 ml  Net  -2250 ml     Physical Exam  Awake Alert, Oriented X 3, No new F.N deficits, Normal affect [place].AT,PERRAL Supple Neck,No JVD, No cervical lymphadenopathy appriciated.  Symmetrical Chest wall movement, Good air movement bilaterally, CTAB RRR,No Gallops,Rubs or new Murmurs, No Parasternal Heave +ve B.Sounds, Abd Soft, Non tender, No organomegaly appriciated, No rebound - guarding or rigidity. No Cyanosis, Clubbing or edema, No new Rash or bruise     Data Review   Micro Results Recent Results (from the past 240 hour(s))  CULTURE, BAL-QUANTITATIVE     Status: None   Collection Time    06/25/13  4:52 PM      Result Value Ref Range Status   Specimen Description BRONCHIAL ALVEOLAR LAVAGE   Final   Special Requests NONE   Final   Gram Stain     Final   Value: ABUNDANT WBC PRESENT, PREDOMINANTLY PMN     RARE SQUAMOUS EPITHELIAL  CELLS PRESENT     FEW GRAM POSITIVE COCCI IN PAIRS AND CHAINS     Performed at [facility]     Final   Value: 80,000 COLONIES/ML     Performed at [facility]   Culture     Final   Value: STAPHYLOCOCCUS AUREUS     Note: RIFAMPIN AND GENTAMICIN SHOULD NOT BE USED AS SINGLE DRUGS FOR TREATMENT OF STAPH INFECTIONS.     Performed at [facility]   Report Status 06/29/2013 FINAL   Final   Organism ID, Bacteria STAPHYLOCOCCUS AUREUS   Final  AFB CULTURE WITH SMEAR     Status: None  Collection Time    06/25/13  4:52 PM      Result Value Ref Range Status   Specimen Description BRONCHIAL ALVEOLAR LAVAGE   Final   Special Requests NONE   Final   ACID FAST SMEAR     Final   Value: NO ACID FAST BACILLI SEEN     Performed at [facility]   Culture     Final   Value: CULTURE WILL BE EXAMINED FOR 6 WEEKS BEFORE ISSUING A FINAL REPORT     Performed at [facility]   Report Status PENDING   Incomplete  FUNGUS CULTURE W SMEAR     Status: None   Collection Time    06/25/13  4:52 PM      Result Value Ref Range Status   Specimen Description BRONCHIAL ALVEOLAR LAVAGE   Final   Special Requests NONE   Final   Fungal Smear     Final   Value: NO YEAST OR FUNGAL ELEMENTS SEEN     Performed at [facility]   Culture     Final   Value: CANDIDA ALBICANS     Performed at [facility]   Report Status PENDING   Incomplete    Radiology Reports Ct Angio Chest Pe W/cm &/or Wo Cm  06/22/2013   ADDENDUM REPORT: 06/22/2013 10:45  ADDENDUM: The patient has a large left hilar mass that extends into the mediastinum into the subcarinal region. Its medial portion is inseparable from the abnormal soft tissues at the junction of the mid and distal thirds of the esophagus. It is this portion of the mass that abuts the anterior aspect of the abdominal aorta. The maximal dimensions of the subcarinal mass are approximately 6.4 cm transversely x 3.2 cm  AP x 3.6 cm longitudinally.  In the left axillary region there is a borderline enlarged lymph node measuring 1.4 cm in diameter.   Electronically Signed   By: David  Martinique   On: 06/22/2013 10:45   06/22/2013   CLINICAL DATA:  Hemoptysis, left lower extremity edema, recent hip surgery  EXAM: CT ANGIOGRAPHY CHEST WITH CONTRAST  TECHNIQUE: Multidetector CT imaging of the chest was performed using the standard protocol during bolus administration of intravenous contrast. Multiplanar CT image reconstructions and MIPs were obtained to evaluate the vascular anatomy.  CONTRAST:  167mL OMNIPAQUE IOHEXOL 350 MG/ML SOLN  COMPARISON:  DG CHEST 1V PORT dated 05/12/2013  FINDINGS: The contrast bolus is limited in terms of density. No definite intraluminal pulmonary artery defects are demonstrated. There is an abnormal mediastinal and left hilar mass. This is producing significant mass effect upon the proximal left main pulmonary artery, but flow distally is still demonstrated. There is also mass effect upon the esophagus with distention of the proximal esophagus to the level of the carina. There is an anterior left hilar soft tissue mass 3.2 cm transversely by 4.9 cm AP. There is a 2 cm right hilar lymph node on image 48. Definite paratracheal lymphadenopathy is not demonstrated. The cardiac chambers are mildly enlarged. There is no pericardial effusion. The caliber of the thoracic aorta is normal. These soft tissue mass abuts the anterior and left lateral surface of the descending thoracic aorta.  There is a small left pleural effusion. Patchy areas of increased parenchymal density are present in the left upper lobe. Confluent increased density peripherally in the left lower lobe is present. On the right there is subsegmental atelectasis or early infiltrate in the lower lobe. There  are emphysematous changes in both lungs.  Within the upper abdomen the observed portions of the liver and spleen appear normal. The adrenal glands  are not included in the field of view. The thoracic vertebral bodies are preserved in height. The sternum appears intact. The observed portions of the ribs exhibit no acute abnormalities.  Review of the MIP images confirms the above findings.  IMPRESSION: 1. There is an abnormal mediastinal and left hilar mass producing mass effect upon the left main pulmonary artery. Classic pulmonary emboli are not demonstrated but vascular flow to the left lung is markedly impaired by the large mass. This mass is also resulting in esophageal obstruction with the proximal and mid portions of the esophagus distended with fluid and gas. The appearance is consistent with a central lung malignancy or malignancy of the distal esophagus. 2. There is a small left pleural effusion. 3. There is postobstructive atelectasis in the left lower lobe. Patchy areas of interstitial density in the left upper lobe are nonspecific but could reflect parenchymal metastatic disease. There is likely atelectasis or early infiltrate in the right lower lobe. 4. These results were called by telephone at the time of interpretation on 06/21/2013 at 6:00 PM to Dr. Tanna Furry , who verbally acknowledged these results.  Electronically Signed: By: David  Martinique On: 06/21/2013 18:02   Dg Esophagus  06/23/2013   CLINICAL DATA:  Dysphagia. Blockage seen on modified barium swallow.  EXAM: ESOPHOGRAM/BARIUM SWALLOW  TECHNIQUE: Single contrast examination was performed using  thin barium.  COMPARISON:  Chest CT 06/21/2013  FLUOROSCOPY TIME:  1 min, 35 seconds  FINDINGS: Prior to the patient ingesting additional barium, fluoroscopy demonstrates retained food/debris as well as barium from a earlier modified barium swallow study. The patient was able to take several swallows of thin barium. This mixes with the foods/ debris in the obstructed proximal to mid esophagus, with irregular tapering just below the aortic arch compatible with near complete obstructing esophageal  mass. A small amount of contrast passes through this area in into the distal esophagus and stomach.  IMPRESSION: Near complete obstruction of the mid esophagus with irregular tapering of the esophagus at this level concerning for esophageal mass/ neoplasm.   Electronically Signed   By: Rolm Baptise M.D.   On: 06/23/2013 17:25   Dg Swallowing Func-speech Pathology  06/23/2013   Macario Golds, CCC-SLP     06/23/2013  4:27 PM Objective Swallowing Evaluation: Modified Barium Swallowing Study   Patient Details  Name: CHARLETON DEYOUNG MRN: [id number] Date of Birth: [birth year]  Today's Date: 06/23/2013 Time: 5809-9833 SLP Time Calculation (min): 52 min  Past Medical History:  Past Medical History  Diagnosis Date  . Hypertension   . Hyperlipidemia   . AAA (abdominal aortic aneurysm)   . Arthritis   . CAD (coronary artery disease)   . Thyroid disease     hypothyroidism  . Dysrhythmia     atrial flutter  . Shortness of breath     a little with exertion  . Polio 1934  . Atrial flutter     chronic anticoagulation  . COPD (chronic obstructive pulmonary disease)   . History of nuclear stress test 04/2010    dipyridamole; small, mostly fixed basal to mid inferior and  inferoseptal defect (gut artifact v. scar); post stress EF 62%;  non-diagnostic for ischemia; low risk    Past Surgical History:  Past Surgical History  Procedure Laterality Date  . Abdominal aortic aneurysm repair  05/21/2010  aorto bi-iliac B[facility]  . Appendectomy  1949  . Popliteal synovial cyst excision Left   . Joint replacement      Knee and Hip replacements  . Total hip arthroplasty Left 1994  . Knee arthroplasty Left 2004  . Total hip revision Left 12/04/2012    Procedure: LEFT TOTAL HIP REVISION;  Surgeon: Mauri Pole,  MD;  Location: [facility];  Service: Orthopedics;  Laterality: Left;  . Cardiac catheterization  2009    in [place]; 50-60% RCA  . Transthoracic echocardiogram  04/2010    EF =>55%; RV borderline dilated; LA mildly dilated; mild mitral  annular  calcif & mild MR; mild TR; mild calcif of AV leaflets  with mild AV stenosis; aortic root sclerosis/calcif  . Orif femur fracture Left 01/22/2013    Procedure: OPEN REDUCTION INTERNAL FIXATION (ORIF) DISTAL FEMUR  FRACTURE;  Surgeon: Mauri Pole, MD;  Location: [facility];   Service: Orthopedics;  Laterality: Left;   HPI:  80 yo male adm to [facility] with hemopytsis.  Pt found to have  subcarinal mass with esophageal obstruction proximal and mid with  gas noted, Chest imaging showed post obstructive atelectasis LLL,  ? RLL infection/ ATX.  Pt reports 43 pound weight loss since Oct  2014 and aphonia x 2 1/2 months with gradual onset and no  worsening.  Pt also with choking on food more than drink per his  report.  RN reports pt coughing with medicine.  BSE ordered.      Assessment / Plan / Recommendation Clinical Impression  Dysphagia Diagnosis: Mild oral phase dysphagia;Moderate  pharyngeal phase dysphagia;Suspected primary esophageal  dysphagia;Moderate cervical esophageal phase dysphagia   Clinical impression: Mild oral and moderate pharyngeal-cervical  esophageal dysphagia with suspected primary esophageal deficits.   Pt has sensorimotor deficits resulting in weak muscle contraction  and pharyngeal stasis without pt awareness.  Laryngeal elevation  was very poor negatively impacting airway protection and UES  opening.   Chin tuck posture along with dry swallows decreased  amount of pharyngeal stasis of liquids.  Head turn right with  chin tuck not effective.  Laryngeal penetration (silent) of small  amount of barium noted after swallow without pt awareness-  presumed from pyriform residuals spilling into open larynx.    Pt coughed during MBS without barium visualized in larynx -  suspect possible secretion aspiration occuring based on secretion  stasis in pharynx.    SLP only tested liquids due to appearance of stasis and narrowing  in mid-esophagus that did NOT clear with multiple swallows or  liquids. Diffiuclt  situation due to multifactorial dysphagia as  pt with at risk of aspiration due to oropharyngeal weakness and  suspected esophageal component (appearance of narrowing with  retained secretions above - ? from extrinsic compression from  mass.  Please note radiologist not present during MBS to confirm  findings.      Recommend medicine be given IV and pt be allowed SMALL amounts of  tsps of thin water (? clears) with chin tuck and dry swallows for  comfort.    Rec dc intake if pt coughing or senses stasis *he did not have  sensation to poor clearance during entire test.  Pt given oral  suction and SLP demonstrated use to help clear secretions from  pharynx if able to cough.  As pt reports h/o dysphagia and  coughing up secretions x3 weeks, suspect acute on chronic  dysphagia.       Treatment Recommendation  Therapy  as outlined in treatment plan below    Diet Recommendation  (? single tsps clears with chin tuck - SMALL  SINGLE TSPS ONLY)   Liquid Administration via: Spoon Medication Administration: Via alternative means Compensations: Multiple dry swallows after each bite/sip;Hard  cough after swallow Postural Changes and/or Swallow Maneuvers: Seated upright 90  degrees;Upright 30-60 min after meal;Chin tuck    Other  Recommendations Recommended Consults: MBS Oral Care Recommendations: Oral care Q4 per protocol   Follow Up Recommendations   ? Referral to address possible masses causing extrinsic  obstruction on esophagus   Frequency and Duration min 2x/week  2 weeks        General Date of Onset: 06/23/13 HPI: 81 yo male adm to [facility] with hemopytsis.  Pt found to have  subcarinal mass with esophageal obstruction proximal and mid with  gas noted, Chest imaging showed post obstructive atelectasis LLL,  ? RLL infection/ ATX.  Pt reports 43 pound weight loss since Oct  2014 and aphonia x 2 1/2 months with gradual onset and no  worsening.  Pt also with choking on food more than drink per his  report.  RN reports pt coughing  with medicine.  BSE ordered.  Type of Study: Modified Barium Swallowing Study Reason for Referral: Objectively evaluate swallowing function Diet Prior to this Study: NPO Temperature Spikes Noted: No Respiratory Status: Nasal cannula History of Recent Intubation: No Behavior/Cognition: Alert;Cooperative;Pleasant mood Oral Cavity - Dentition: Missing dentition (few teeth) Oral Motor / Sensory Function: Impaired - see Bedside swallow  eval Self-Feeding Abilities: Able to feed self Patient Positioning: Upright in chair Baseline Vocal Quality: Low vocal intensity;Breathy Volitional Cough: Weak Volitional Swallow: Able to elicit Pharyngeal Secretions: Standing secretions in (comment) (pharynx)     Reason for Referral Objectively evaluate swallowing function   Oral Phase Oral Preparation/Oral Phase Oral Phase: Impaired Oral - Nectar Oral - Nectar Teaspoon: Reduced posterior  propulsion;Lingual/palatal residue;Weak lingual manipulation Oral - Nectar Straw: Reduced posterior propulsion;Lingual/palatal  residue;Weak lingual manipulation Oral - Thin Oral - Thin Teaspoon: Reduced posterior  propulsion;Lingual/palatal residue;Weak lingual manipulation Oral - Thin Cup: Reduced posterior propulsion;Lingual/palatal  residue;Weak lingual manipulation Oral - Thin Straw: Reduced posterior propulsion;Lingual/palatal  residue;Weak lingual manipulation   Pharyngeal Phase Pharyngeal Phase Pharyngeal Phase: Impaired Pharyngeal - Nectar Pharyngeal - Nectar Teaspoon: Reduced epiglottic  inversion;Reduced pharyngeal peristalsis;Reduced anterior  laryngeal mobility;Reduced laryngeal elevation;Pharyngeal residue  - pyriform sinuses;Pharyngeal residue - valleculae;Trace  aspiration;Pharyngeal residue - cp segment Pharyngeal - Nectar Cup: Reduced epiglottic inversion;Reduced  pharyngeal peristalsis;Reduced anterior laryngeal  mobility;Reduced laryngeal elevation;Pharyngeal residue -  valleculae;Pharyngeal residue - pyriform sinuses;Reduced tongue   base retraction;Pharyngeal residue - cp  segment;Penetration/Aspiration after swallow Penetration/Aspiration details (nectar cup): Material enters  airway, remains ABOVE vocal cords and not ejected out Pharyngeal - Thin Pharyngeal - Thin Teaspoon: Reduced epiglottic inversion;Reduced  pharyngeal peristalsis;Reduced anterior laryngeal  mobility;Reduced laryngeal elevation;Pharyngeal residue -  valleculae;Pharyngeal residue - pyriform sinuses;Reduced tongue  base retraction;Pharyngeal residue - cp segment Pharyngeal - Thin Cup: Reduced epiglottic inversion;Reduced  pharyngeal peristalsis;Reduced anterior laryngeal  mobility;Reduced laryngeal elevation;Pharyngeal residue -  valleculae;Pharyngeal residue - pyriform sinuses;Reduced tongue  base retraction;Pharyngeal residue - cp segment Pharyngeal - Thin Straw: Reduced epiglottic inversion;Reduced  pharyngeal peristalsis;Reduced anterior laryngeal  mobility;Reduced laryngeal elevation;Pharyngeal residue -  valleculae;Pharyngeal residue - pyriform sinuses;Reduced tongue  base retraction;Pharyngeal residue - cp segment Pharyngeal Phase - Comment Pharyngeal Comment: chin tuck with liquids decreases amount of  pharyngeal accumulation, cued dry swallows - pt able to cough and  "hock" up  mild amount of stasis x1, does not sense pharygneal  stasis  Cervical Esophageal Phase    GO    Cervical Esophageal Phase Cervical Esophageal Phase: Impaired Cervical Esophageal Phase - Nectar Nectar Teaspoon: Reduced cricopharyngeal relaxation Nectar Cup: Reduced cricopharyngeal relaxation Cervical Esophageal Phase - Thin Thin Teaspoon: Reduced cricopharyngeal relaxation Thin Cup: Reduced cricopharyngeal relaxation Thin Straw: Reduced cricopharyngeal relaxation         Luanna Salk, MS [facility] SLP [phone]     CBC  Recent Labs Lab 06/24/13 0518  WBC 6.9  HGB 11.1*  HCT 32.9*  PLT 181  MCV 89.4  MCH 30.2  MCHC 33.7  RDW 16.9*    Chemistries  No results found for this  basename: NA, K, CL, CO2, GLUCOSE, BUN, CREATININE, GFRCGP, CALCIUM, MG, AST, ALT, ALKPHOS, BILITOT,  in the last 168 hours ------------------------------------------------------------------------------------------------------------------ estimated creatinine clearance is 95.3 ml/min (by C-G formula based on Cr of 0.67). ------------------------------------------------------------------------------------------------------------------ No results found for this basename: HGBA1C,  in the last 72 hours ------------------------------------------------------------------------------------------------------------------ No results found for this basename: CHOL, HDL, LDLCALC, TRIG, CHOLHDL, LDLDIRECT,  in the last 72 hours ------------------------------------------------------------------------------------------------------------------ No results found for this basename: TSH, T4TOTAL, FREET3, T3FREE, THYROIDAB,  in the last 72 hours ------------------------------------------------------------------------------------------------------------------ No results found for this basename: VITAMINB12, FOLATE, FERRITIN, TIBC, IRON, RETICCTPCT,  in the last 72 hours  Coagulation profile No results found for this basename: INR, PROTIME,  in the last 168 hours  No results found for this basename: DDIMER,  in the last 72 hours  Cardiac Enzymes No results found for this basename: CK, CKMB, TROPONINI, MYOGLOBIN,  in the last 168 hours ------------------------------------------------------------------------------------------------------------------ No components found with this basename: POCBNP,      Time Spent in minutes   35   Lala Lund K M.D on 06/30/2013 at 10:34 AM  Between 7am to 7pm - Pager - [phone]  After 7pm go to [email or web address] - password [email or web address]  And look for the night coverage person covering for me after hours  [facility] Office  [phone]

## 2013-06-30 NOTE — Progress Notes (Signed)
PT Cancellation Note  Patient Details Name: Austin [name] MRN: [id number] DOB: 06-May-1926   Cancelled Treatment:     PT deferred this pm, pt asleep, RN states family requested he be allowed to sleep.  Will follow.   Ameirah Khatoon 06/30/2013, 4:32 PM

## 2013-07-01 MED ORDER — FLUCONAZOLE 100MG IVPB
100.0000 mg | INTRAVENOUS | Status: DC
  Administered 2013-07-01 – 2013-07-04 (×3): 100 mg via INTRAVENOUS
  Filled 2013-07-01 (×5): qty 50

## 2013-07-01 NOTE — Progress Notes (Signed)
Patient Demographics  Austin [name], is a 80 y.o. male, DOB - 03/22/1927, TMA:[id number]  Admit date - 06/21/2013   Admitting Physician [name], MD  Outpatient Primary MD for the patient is [name], MD  LOS - 10   Chief Complaint  Patient presents with  . Hematemesis        Assessment & Plan    80 y/o male with PMH of HTN, COPD, HPL, A fib, CVA, recent sepsis, recurrent pneumonia, c diff presented with hemoptysis found to have lung, esophageal mass.      1. Hemoptysis/Lung mass likely lung CA (+tobacco use); non-small cell lung cancer with either metastases to the esophagus or compressing esophagus - CT: abnormal mediastinal and left hilar mass producing mass effect upon the left main pulmonary artery mass is also resulting in esophageal obstruction with the proximal and mid portions of the esophagus seen by oncology, pulmonary and radiation oncology, status post bronchoscopy on 06/25/2013, now a candidate for comfort care and palliative care. Palliative radiation treatments to be started on 07/02/2013, family and patient understand that these are only palliative in nature and patient is expected to pass away soon.Austin [name]    MSSA in Bronch washing recent H/O of Asp.PNA - doxy IV for now.     2. Dysphagia due esophageal mass lesion: failed swallow evaluation; at risk for aspiration, recent h/p aspiration PNA, esophagogram shows Near complete obstruction of the mid esophagus with irregular tapering of the esophagus atthis level concerning for esophageal mass/ neoplasm. Per GI hold EGD; ? Need stenting (if done will need tracheal stenting as well), palliative radiation treatments in hopes of shrinking the mass in relieving some obstruction for comfort feeds.     3. Atrial  fibrillation: rate Controlled, continue with metoprolol and digoxin, hold anticoagulation it due to to #1      4. Hypertension: Acceptable continue with metoprolol     5. Hyperlipedemia: Statin discontinued as goal of care now comfort.    6. COPD; h/o tobacco use; no wheezing on exam; cont bronchodilators as needed      7. History of CVA holding Xarelto due to hemoptysis;      8. History of recent left hip repair  per notes: Dr. Alvan Dame on 1/28-okay for 50% weightbearing on the left lower extremity. Patient to followup with Dr. Alvan Dame in in one month for repeat x-rays, if continues to do well with good healing, patient will likely be advanced to more weightbearing status  -xray: 1/28: Healing fracture involving the distal shaft of the left femur status post sideplate and screw device reduction. He remains severely deconditioned and requiring 2 person assistance getting out of the bed.       Prognosis is guarded,  Goal of care is comfort with gentle medical treatment and palliative radiation treatments.      Code Status: DNR  Family Communication:  Updated whole family bedside 06/29/2013  Disposition Plan:  TBD   Procedures  bronchoscopy 3-9, CT scan chest, barium swallow   Consults   PCCM, Pall care, Oncology, GI, CCS   Medications  Scheduled Meds: . digoxin  0.125 mg Intravenous Daily  . doxycycline (VIBRAMYCIN) IV  100 mg Intravenous Q12H  . levothyroxine  37.5 mcg Intravenous Daily  Continuous Infusions: . sodium chloride 20 mL/hr at 06/25/13 1611   PRN Meds:.acetaminophen, albuterol, albuterol, feeding supplement (ENSURE COMPLETE), fentaNYL, guaifenesin, lidocaine, lidocaine, metoprolol, midazolam, morphine injection, ondansetron (ZOFRAN) IV, phenylephrine  DVT Prophylaxis   SCDs   Lab Results  Component Value Date   PLT 181 06/24/2013    Antibiotics     Anti-infectives   Start     Dose/Rate Route Frequency Ordered Stop   06/29/13 1045  doxycycline  (VIBRAMYCIN) 100 mg in dextrose 5 % 250 mL IVPB     100 mg 125 mL/hr over 120 Minutes Intravenous Every 12 hours 06/29/13 1036     06/29/13 0800  doxycycline (VIBRA-TABS) tablet 100 mg  Status:  Discontinued     100 mg Oral Every 12 hours 06/29/13 0654 06/29/13 1036          Subjective:   Austin [name] today has, No headache, No chest pain, No abdominal pain - No Nausea, No new weakness tingling or numbness, No Cough - SOB.+ve dysphagia  Objective:   Filed Vitals:   06/30/13 0622 06/30/13 1518 06/30/13 2105 07/01/13 0603  BP: 125/72 133/80 123/74 121/76  Pulse: 79 84 88 126  Temp: 97.4 F (36.3 C) 97.4 F (36.3 C) 98 F (36.7 C) 98 F (36.7 C)  TempSrc: Oral Oral Oral Oral  Resp: 18 18 20 18   Height:      Weight:      SpO2: 94% 96% 90% 93%    Wt Readings from Last 3 Encounters:  06/21/13 111.3 kg (245 lb 6 oz)  06/21/13 111.3 kg (245 lb 6 oz)  05/12/13 101.606 kg (224 lb)     Intake/Output Summary (Last 24 hours) at 07/01/13 0927 Last data filed at 07/01/13 0604  Gross per 24 hour  Intake    500 ml  Output   1075 ml  Net   -575 ml     Physical Exam  Awake Alert, Oriented X 3, No new F.N deficits, Normal affect [place].AT,PERRAL Supple Neck,No JVD, No cervical lymphadenopathy appriciated.  Symmetrical Chest wall movement, Good air movement bilaterally, CTAB RRR,No Gallops,Rubs or new Murmurs, No Parasternal Heave +ve B.Sounds, Abd Soft, Non tender, No organomegaly appriciated, No rebound - guarding or rigidity. No Cyanosis, Clubbing or edema, No new Rash or bruise     Data Review   Micro Results Recent Results (from the past 240 hour(s))  CULTURE, BAL-QUANTITATIVE     Status: None   Collection Time    06/25/13  4:52 PM      Result Value Ref Range Status   Specimen Description BRONCHIAL ALVEOLAR LAVAGE   Final   Special Requests NONE   Final   Gram Stain     Final   Value: ABUNDANT WBC PRESENT, PREDOMINANTLY PMN     RARE SQUAMOUS EPITHELIAL CELLS  PRESENT     FEW GRAM POSITIVE COCCI IN PAIRS AND CHAINS     Performed at [facility]     Final   Value: 80,000 COLONIES/ML     Performed at [facility]   Culture     Final   Value: STAPHYLOCOCCUS AUREUS     Note: RIFAMPIN AND GENTAMICIN SHOULD NOT BE USED AS SINGLE DRUGS FOR TREATMENT OF STAPH INFECTIONS.     Performed at [facility]   Report Status 06/29/2013 FINAL   Final   Organism ID, Bacteria STAPHYLOCOCCUS AUREUS   Final  AFB CULTURE WITH SMEAR     Status: None  Collection Time    06/25/13  4:52 PM      Result Value Ref Range Status   Specimen Description BRONCHIAL ALVEOLAR LAVAGE   Final   Special Requests NONE   Final   ACID FAST SMEAR     Final   Value: NO ACID FAST BACILLI SEEN     Performed at [facility]   Culture     Final   Value: CULTURE WILL BE EXAMINED FOR 6 WEEKS BEFORE ISSUING A FINAL REPORT     Performed at [facility]   Report Status PENDING   Incomplete  FUNGUS CULTURE W SMEAR     Status: None   Collection Time    06/25/13  4:52 PM      Result Value Ref Range Status   Specimen Description BRONCHIAL ALVEOLAR LAVAGE   Final   Special Requests NONE   Final   Fungal Smear     Final   Value: NO YEAST OR FUNGAL ELEMENTS SEEN     Performed at [facility]   Culture     Final   Value: CANDIDA ALBICANS     Performed at [facility]   Report Status PENDING   Incomplete    Radiology Reports Ct Angio Chest Pe W/cm &/or Wo Cm  06/22/2013   ADDENDUM REPORT: 06/22/2013 10:45  ADDENDUM: The patient has a large left hilar mass that extends into the mediastinum into the subcarinal region. Its medial portion is inseparable from the abnormal soft tissues at the junction of the mid and distal thirds of the esophagus. It is this portion of the mass that abuts the anterior aspect of the abdominal aorta. The maximal dimensions of the subcarinal mass are approximately 6.4 cm transversely x 3.2 cm AP x  3.6 cm longitudinally.  In the left axillary region there is a borderline enlarged lymph node measuring 1.4 cm in diameter.   Electronically Signed   By: David  Martinique   On: 06/22/2013 10:45   06/22/2013   CLINICAL DATA:  Hemoptysis, left lower extremity edema, recent hip surgery  EXAM: CT ANGIOGRAPHY CHEST WITH CONTRAST  TECHNIQUE: Multidetector CT imaging of the chest was performed using the standard protocol during bolus administration of intravenous contrast. Multiplanar CT image reconstructions and MIPs were obtained to evaluate the vascular anatomy.  CONTRAST:  154mL OMNIPAQUE IOHEXOL 350 MG/ML SOLN  COMPARISON:  DG CHEST 1V PORT dated 05/12/2013  FINDINGS: The contrast bolus is limited in terms of density. No definite intraluminal pulmonary artery defects are demonstrated. There is an abnormal mediastinal and left hilar mass. This is producing significant mass effect upon the proximal left main pulmonary artery, but flow distally is still demonstrated. There is also mass effect upon the esophagus with distention of the proximal esophagus to the level of the carina. There is an anterior left hilar soft tissue mass 3.2 cm transversely by 4.9 cm AP. There is a 2 cm right hilar lymph node on image 48. Definite paratracheal lymphadenopathy is not demonstrated. The cardiac chambers are mildly enlarged. There is no pericardial effusion. The caliber of the thoracic aorta is normal. These soft tissue mass abuts the anterior and left lateral surface of the descending thoracic aorta.  There is a small left pleural effusion. Patchy areas of increased parenchymal density are present in the left upper lobe. Confluent increased density peripherally in the left lower lobe is present. On the right there is subsegmental atelectasis or early infiltrate in the lower lobe. There  are emphysematous changes in both lungs.  Within the upper abdomen the observed portions of the liver and spleen appear normal. The adrenal glands are not  included in the field of view. The thoracic vertebral bodies are preserved in height. The sternum appears intact. The observed portions of the ribs exhibit no acute abnormalities.  Review of the MIP images confirms the above findings.  IMPRESSION: 1. There is an abnormal mediastinal and left hilar mass producing mass effect upon the left main pulmonary artery. Classic pulmonary emboli are not demonstrated but vascular flow to the left lung is markedly impaired by the large mass. This mass is also resulting in esophageal obstruction with the proximal and mid portions of the esophagus distended with fluid and gas. The appearance is consistent with a central lung malignancy or malignancy of the distal esophagus. 2. There is a small left pleural effusion. 3. There is postobstructive atelectasis in the left lower lobe. Patchy areas of interstitial density in the left upper lobe are nonspecific but could reflect parenchymal metastatic disease. There is likely atelectasis or early infiltrate in the right lower lobe. 4. These results were called by telephone at the time of interpretation on 06/21/2013 at 6:00 PM to Dr. Tanna Furry , who verbally acknowledged these results.  Electronically Signed: By: David  Martinique On: 06/21/2013 18:02   Dg Esophagus  06/23/2013   CLINICAL DATA:  Dysphagia. Blockage seen on modified barium swallow.  EXAM: ESOPHOGRAM/BARIUM SWALLOW  TECHNIQUE: Single contrast examination was performed using  thin barium.  COMPARISON:  Chest CT 06/21/2013  FLUOROSCOPY TIME:  1 min, 35 seconds  FINDINGS: Prior to the patient ingesting additional barium, fluoroscopy demonstrates retained food/debris as well as barium from a earlier modified barium swallow study. The patient was able to take several swallows of thin barium. This mixes with the foods/ debris in the obstructed proximal to mid esophagus, with irregular tapering just below the aortic arch compatible with near complete obstructing esophageal mass. A  small amount of contrast passes through this area in into the distal esophagus and stomach.  IMPRESSION: Near complete obstruction of the mid esophagus with irregular tapering of the esophagus at this level concerning for esophageal mass/ neoplasm.   Electronically Signed   By: Rolm Baptise M.D.   On: 06/23/2013 17:25   Dg Swallowing Func-speech Pathology  06/23/2013   Macario Golds, CCC-SLP     06/23/2013  4:27 PM Objective Swallowing Evaluation: Modified Barium Swallowing Study   Patient Details  Name: Austin [name] MRN: [id number] Date of Birth: 1926-11-05  Today's Date: 06/23/2013 Time: 8325-4982 SLP Time Calculation (min): 52 min  Past Medical History:  Past Medical History  Diagnosis Date  . Hypertension   . Hyperlipidemia   . AAA (abdominal aortic aneurysm)   . Arthritis   . CAD (coronary artery disease)   . Thyroid disease     hypothyroidism  . Dysrhythmia     atrial flutter  . Shortness of breath     a little with exertion  . Polio 1934  . Atrial flutter     chronic anticoagulation  . COPD (chronic obstructive pulmonary disease)   . History of nuclear stress test 04/2010    dipyridamole; small, mostly fixed basal to mid inferior and  inferoseptal defect (gut artifact v. scar); post stress EF 62%;  non-diagnostic for ischemia; low risk    Past Surgical History:  Past Surgical History  Procedure Laterality Date  . Abdominal aortic aneurysm repair  05/21/2010  aorto bi-iliac BPG  . Appendectomy  1949  . Popliteal synovial cyst excision Left   . Joint replacement      Knee and Hip replacements  . Total hip arthroplasty Left 1994  . Knee arthroplasty Left 2004  . Total hip revision Left 12/04/2012    Procedure: LEFT TOTAL HIP REVISION;  Surgeon: Mauri Pole,  MD;  Location: [facility];  Service: Orthopedics;  Laterality: Left;  . Cardiac catheterization  2009    in [place]; 50-60% RCA  . Transthoracic echocardiogram  04/2010    EF =>55%; RV borderline dilated; LA mildly dilated; mild mitral  annular calcif &  mild MR; mild TR; mild calcif of AV leaflets  with mild AV stenosis; aortic root sclerosis/calcif  . Orif femur fracture Left 01/22/2013    Procedure: OPEN REDUCTION INTERNAL FIXATION (ORIF) DISTAL FEMUR  FRACTURE;  Surgeon: Mauri Pole, MD;  Location: [facility];   Service: Orthopedics;  Laterality: Left;   HPI:  80 yo male adm to [facility] with hemopytsis.  Pt found to have  subcarinal mass with esophageal obstruction proximal and mid with  gas noted, Chest imaging showed post obstructive atelectasis LLL,  ? RLL infection/ ATX.  Pt reports 43 pound weight loss since Oct  2014 and aphonia x 2 1/2 months with gradual onset and no  worsening.  Pt also with choking on food more than drink per his  report.  RN reports pt coughing with medicine.  BSE ordered.      Assessment / Plan / Recommendation Clinical Impression  Dysphagia Diagnosis: Mild oral phase dysphagia;Moderate  pharyngeal phase dysphagia;Suspected primary esophageal  dysphagia;Moderate cervical esophageal phase dysphagia   Clinical impression: Mild oral and moderate pharyngeal-cervical  esophageal dysphagia with suspected primary esophageal deficits.   Pt has sensorimotor deficits resulting in weak muscle contraction  and pharyngeal stasis without pt awareness.  Laryngeal elevation  was very poor negatively impacting airway protection and UES  opening.   Chin tuck posture along with dry swallows decreased  amount of pharyngeal stasis of liquids.  Head turn right with  chin tuck not effective.  Laryngeal penetration (silent) of small  amount of barium noted after swallow without pt awareness-  presumed from pyriform residuals spilling into open larynx.    Pt coughed during MBS without barium visualized in larynx -  suspect possible secretion aspiration occuring based on secretion  stasis in pharynx.    SLP only tested liquids due to appearance of stasis and narrowing  in mid-esophagus that did NOT clear with multiple swallows or  liquids. Diffiuclt situation due to  multifactorial dysphagia as  pt with at risk of aspiration due to oropharyngeal weakness and  suspected esophageal component (appearance of narrowing with  retained secretions above - ? from extrinsic compression from  mass.  Please note radiologist not present during MBS to confirm  findings.      Recommend medicine be given IV and pt be allowed SMALL amounts of  tsps of thin water (? clears) with chin tuck and dry swallows for  comfort.    Rec dc intake if pt coughing or senses stasis *he did not have  sensation to poor clearance during entire test.  Pt given oral  suction and SLP demonstrated use to help clear secretions from  pharynx if able to cough.  As pt reports h/o dysphagia and  coughing up secretions x3 weeks, suspect acute on chronic  dysphagia.       Treatment Recommendation  Therapy  as outlined in treatment plan below    Diet Recommendation  (? single tsps clears with chin tuck - SMALL  SINGLE TSPS ONLY)   Liquid Administration via: Spoon Medication Administration: Via alternative means Compensations: Multiple dry swallows after each bite/sip;Hard  cough after swallow Postural Changes and/or Swallow Maneuvers: Seated upright 90  degrees;Upright 30-60 min after meal;Chin tuck    Other  Recommendations Recommended Consults: MBS Oral Care Recommendations: Oral care Q4 per protocol   Follow Up Recommendations   ? Referral to address possible masses causing extrinsic  obstruction on esophagus   Frequency and Duration min 2x/week  2 weeks        General Date of Onset: 06/23/13 HPI: 80 yo male adm to [facility] with hemopytsis.  Pt found to have  subcarinal mass with esophageal obstruction proximal and mid with  gas noted, Chest imaging showed post obstructive atelectasis LLL,  ? RLL infection/ ATX.  Pt reports 43 pound weight loss since Oct  2014 and aphonia x 2 1/2 months with gradual onset and no  worsening.  Pt also with choking on food more than drink per his  report.  RN reports pt coughing with medicine.  BSE  ordered.  Type of Study: Modified Barium Swallowing Study Reason for Referral: Objectively evaluate swallowing function Diet Prior to this Study: NPO Temperature Spikes Noted: No Respiratory Status: Nasal cannula History of Recent Intubation: No Behavior/Cognition: Alert;Cooperative;Pleasant mood Oral Cavity - Dentition: Missing dentition (few teeth) Oral Motor / Sensory Function: Impaired - see Bedside swallow  eval Self-Feeding Abilities: Able to feed self Patient Positioning: Upright in chair Baseline Vocal Quality: Low vocal intensity;Breathy Volitional Cough: Weak Volitional Swallow: Able to elicit Pharyngeal Secretions: Standing secretions in (comment) (pharynx)     Reason for Referral Objectively evaluate swallowing function   Oral Phase Oral Preparation/Oral Phase Oral Phase: Impaired Oral - Nectar Oral - Nectar Teaspoon: Reduced posterior  propulsion;Lingual/palatal residue;Weak lingual manipulation Oral - Nectar Straw: Reduced posterior propulsion;Lingual/palatal  residue;Weak lingual manipulation Oral - Thin Oral - Thin Teaspoon: Reduced posterior  propulsion;Lingual/palatal residue;Weak lingual manipulation Oral - Thin Cup: Reduced posterior propulsion;Lingual/palatal  residue;Weak lingual manipulation Oral - Thin Straw: Reduced posterior propulsion;Lingual/palatal  residue;Weak lingual manipulation   Pharyngeal Phase Pharyngeal Phase Pharyngeal Phase: Impaired Pharyngeal - Nectar Pharyngeal - Nectar Teaspoon: Reduced epiglottic  inversion;Reduced pharyngeal peristalsis;Reduced anterior  laryngeal mobility;Reduced laryngeal elevation;Pharyngeal residue  - pyriform sinuses;Pharyngeal residue - valleculae;Trace  aspiration;Pharyngeal residue - cp segment Pharyngeal - Nectar Cup: Reduced epiglottic inversion;Reduced  pharyngeal peristalsis;Reduced anterior laryngeal  mobility;Reduced laryngeal elevation;Pharyngeal residue -  valleculae;Pharyngeal residue - pyriform sinuses;Reduced tongue  base  retraction;Pharyngeal residue - cp  segment;Penetration/Aspiration after swallow Penetration/Aspiration details (nectar cup): Material enters  airway, remains ABOVE vocal cords and not ejected out Pharyngeal - Thin Pharyngeal - Thin Teaspoon: Reduced epiglottic inversion;Reduced  pharyngeal peristalsis;Reduced anterior laryngeal  mobility;Reduced laryngeal elevation;Pharyngeal residue -  valleculae;Pharyngeal residue - pyriform sinuses;Reduced tongue  base retraction;Pharyngeal residue - cp segment Pharyngeal - Thin Cup: Reduced epiglottic inversion;Reduced  pharyngeal peristalsis;Reduced anterior laryngeal  mobility;Reduced laryngeal elevation;Pharyngeal residue -  valleculae;Pharyngeal residue - pyriform sinuses;Reduced tongue  base retraction;Pharyngeal residue - cp segment Pharyngeal - Thin Straw: Reduced epiglottic inversion;Reduced  pharyngeal peristalsis;Reduced anterior laryngeal  mobility;Reduced laryngeal elevation;Pharyngeal residue -  valleculae;Pharyngeal residue - pyriform sinuses;Reduced tongue  base retraction;Pharyngeal residue - cp segment Pharyngeal Phase - Comment Pharyngeal Comment: chin tuck with liquids decreases amount of  pharyngeal accumulation, cued dry swallows - pt able to cough and  "hock" up  mild amount of stasis x1, does not sense pharygneal  stasis  Cervical Esophageal Phase    GO    Cervical Esophageal Phase Cervical Esophageal Phase: Impaired Cervical Esophageal Phase - Nectar Nectar Teaspoon: Reduced cricopharyngeal relaxation Nectar Cup: Reduced cricopharyngeal relaxation Cervical Esophageal Phase - Thin Thin Teaspoon: Reduced cricopharyngeal relaxation Thin Cup: Reduced cricopharyngeal relaxation Thin Straw: Reduced cricopharyngeal relaxation         Luanna Salk, MS [facility] SLP [phone]     CBC No results found for this basename: WBC, HGB, HCT, PLT, MCV, MCH, MCHC, RDW, NEUTRABS, LYMPHSABS, MONOABS, EOSABS, BASOSABS, BANDABS, BANDSABD,  in the last 168 hours  Chemistries    No results found for this basename: NA, K, CL, CO2, GLUCOSE, BUN, CREATININE, GFRCGP, CALCIUM, MG, AST, ALT, ALKPHOS, BILITOT,  in the last 168 hours ------------------------------------------------------------------------------------------------------------------ estimated creatinine clearance is 95.3 ml/min (by C-G formula based on Cr of 0.67). ------------------------------------------------------------------------------------------------------------------ No results found for this basename: HGBA1C,  in the last 72 hours ------------------------------------------------------------------------------------------------------------------ No results found for this basename: CHOL, HDL, LDLCALC, TRIG, CHOLHDL, LDLDIRECT,  in the last 72 hours ------------------------------------------------------------------------------------------------------------------ No results found for this basename: TSH, T4TOTAL, FREET3, T3FREE, THYROIDAB,  in the last 72 hours ------------------------------------------------------------------------------------------------------------------ No results found for this basename: VITAMINB12, FOLATE, FERRITIN, TIBC, IRON, RETICCTPCT,  in the last 72 hours  Coagulation profile No results found for this basename: INR, PROTIME,  in the last 168 hours  No results found for this basename: DDIMER,  in the last 72 hours  Cardiac Enzymes No results found for this basename: CK, CKMB, TROPONINI, MYOGLOBIN,  in the last 168 hours ------------------------------------------------------------------------------------------------------------------ No components found with this basename: POCBNP,      Time Spent in minutes   35   SINGH,PRASHANT K M.D on 07/01/2013 at 9:27 AM  Between 7am to 7pm - Pager - [phone]  After 7pm go to [email or web address] - password [email or web address]  And look for the night coverage person covering for me after hours  [facility] Office  [phone]

## 2013-07-02 ENCOUNTER — Ambulatory Visit: Payer: Medicare Other

## 2013-07-02 ENCOUNTER — Telehealth: Payer: Self-pay

## 2013-07-02 ENCOUNTER — Ambulatory Visit
Admit: 2013-07-02 | Discharge: 2013-07-02 | Disposition: A | Discharge status: Home / Self Care | Payer: Medicare Other | Attending: Radiation Oncology | Admitting: Radiation Oncology

## 2013-07-02 DIAGNOSIS — C3400 Malignant neoplasm of unspecified main bronchus: Secondary | ICD-10-CM

## 2013-07-02 MED ORDER — MORPHINE SULFATE 2 MG/ML IJ SOLN
2.0000 mg | INTRAMUSCULAR | Status: DC | PRN
  Administered 2013-07-02 – 2013-07-04 (×8): 2 mg via INTRAVENOUS
  Filled 2013-07-02 (×10): qty 1

## 2013-07-02 MED ORDER — SODIUM CHLORIDE 0.9 % IV BOLUS (SEPSIS)
250.0000 mL | Freq: Once | INTRAVENOUS | Status: AC
  Administered 2013-07-02: 250 mL via INTRAVENOUS

## 2013-07-02 MED ORDER — SILVER NITRATE-POT NITRATE 75-25 % EX MISC
10.0000 | CUTANEOUS | Status: DC | PRN
  Filled 2013-07-02 (×3): qty 10

## 2013-07-02 MED ORDER — MORPHINE SULFATE 2 MG/ML IJ SOLN
1.0000 mg | INTRAMUSCULAR | Status: AC
  Administered 2013-07-02: 1 mg via INTRAVENOUS

## 2013-07-02 NOTE — Progress Notes (Signed)
Patient seen and examined, agree with above note.  I dictated the care and orders written for this patient under my direction.  Wesam G Yacoub, MD [phone] 

## 2013-07-02 NOTE — Consult Note (Signed)
Reason for Consult:  Bleeding left posterior back tumor Referring Physician: Dr. Ronnie Derby  Austin [name] is an 80 y.o. male.  HPI: 68 year admitted 06/21/13 with hemoptysis, a history of recurrent pneumonia, complicated with C diff colitis.  He was on Xarelto for stroke prevention/Aflutter, according to the family for 5 years.  He has had this posterior back lesion for some time, but nothing was done because of the pneumonia's.  He was set up for further evaluation but was admitted for hemoptysis before that was accomplished.  CT scan showed:  abnormal mediastinal and left hilar mass producing mass effect upon the left main pulmonary artery mass is also resulting in esophageal obstruction with the proximal and mid portions of the esophagus The appearance is consistent with a central lung malignancy or malignancy of the distal esophagus.  Bronchoscopy on 06/25/13 by Dr. Nelda Marseille.  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 He has been very debilitated and down hill course since his fall and hip fracture last fall. He is currently getting some radiation therapy to hopefully restore some some of his swallowing functio.  He has a large 5 cm lesion left posterior back that is open and has had some serous drainage.  It started bleeding today, and could not be controlled by direct pressure.  We were called to assist with the bleeding from his back tumor.  When I got here his bed sheets and chucks were soaked with blood. I cannot accurately estimate his blood loss but I would guess a 100-200 ml just in the bed.   Past Medical History  Diagnosis Date  COPD/40 year hx of tobacco use   . CAD (coronary artery disease)   Dr. Quay Burow  Dysrhythmia    atrial flutter     History of nuclear stress test 04/2010   dipyridamole; small, mostly fixed basal to mid inferior and inferoseptal defect (gut artifact v. scar); post stress EF 62%; non-diagnostic for ischemia; low risk      AAA (abdominal aortic  aneurysm) 04/17/10 V. Wells Brabham IV   Left hip fx/ 01/22/13    Hypertension   Hyperlipidemia   Arthritis   Thyroid disease    hypothyroidism  Polio 1934       Past Surgical History  Procedure Laterality Date  . Abdominal aortic aneurysm repair  05/21/2010    aorto bi-iliac [facility]  . Appendectomy  1949  . Popliteal synovial cyst excision Left   . Joint replacement      Knee and Hip replacements  . Total hip arthroplasty Left 1994  . Knee arthroplasty Left 2004  . Total hip revision Left 12/04/2012    Procedure: LEFT TOTAL HIP REVISION;  Surgeon: Mauri Pole, MD;  Location: [facility];  Service: Orthopedics;  Laterality: Left;  . Cardiac catheterization  2009    in [place]; 50-60% RCA  . Transthoracic echocardiogram  04/2010    EF =>55%; RV borderline dilated; LA mildly dilated; mild mitral annular calcif & mild MR; mild TR; mild calcif of AV leaflets with mild AV stenosis; aortic root sclerosis/calcif  . Orif femur fracture Left 01/22/2013    Procedure: OPEN REDUCTION INTERNAL FIXATION (ORIF) DISTAL FEMUR FRACTURE;  Surgeon: Mauri Pole, MD;  Location: [facility];  Service: Orthopedics;  Laterality: Left;  . Video bronchoscopy Bilateral 06/25/2013    Procedure: VIDEO BRONCHOSCOPY WITHOUT FLUORO;  Surgeon: Rush Farmer, MD;  Location: [facility];  Service: Cardiopulmonary;  Laterality: Bilateral;    Family  History  Problem Relation Age of Onset  . Other Father     varicose veins  . Hyperlipidemia Son   . Hypertension Son   . AAA (abdominal aortic aneurysm) Son   . Lung cancer Brother     Social History:  reports that he quit smoking about 47 years ago. His smoking use included Cigarettes. He has a 25 pack-year smoking history. He has never used smokeless tobacco. He reports that he drinks alcohol. He reports that he does not use illicit drugs.  Allergies: No Known Allergies  Medications:  Prior to Admission:  Prescriptions prior to admission  Medication Sig Dispense Refill  .  albuterol (PROVENTIL) (2.5 MG/3ML) 0.083% nebulizer solution Take 2.5 mg by nebulization every 6 (six) hours as needed for wheezing or shortness of breath.      . cholecalciferol (VITAMIN D) 1000 UNITS tablet Take 1,000 Units by mouth daily.      . digoxin (LANOXIN) 0.125 MG tablet Take 1 tablet (0.125 mg total) by mouth daily.      . ferrous sulfate 325 (65 FE) MG tablet Take 325 mg by mouth daily with breakfast.      . levothyroxine (SYNTHROID, LEVOTHROID) 75 MCG tablet Take 75 mcg by mouth daily before breakfast.      . lovastatin (MEVACOR) 40 MG tablet Take 40 mg by mouth daily.       . metoprolol tartrate (LOPRESSOR) 25 MG tablet Take 1 tablet (25 mg total) by mouth 2 (two) times daily.  60 tablet  0  . Multiple Vitamin (MULTIVITAMIN WITH MINERALS) TABS tablet Take 1 tablet by mouth daily.      . [DISCONTINUED] naproxen sodium (ANAPROX) 220 MG tablet Take 220-440 mg by mouth as needed (pain).      . [DISCONTINUED] Rivaroxaban (XARELTO) 20 MG TABS Take 20 mg by mouth daily.       Scheduled: . digoxin  0.125 mg Intravenous Daily  . doxycycline (VIBRAMYCIN) IV  100 mg Intravenous Q12H  . fluconazole (DIFLUCAN) IV  100 mg Intravenous Q24H  . levothyroxine  37.5 mcg Intravenous Daily  . sodium chloride  250 mL Intravenous Once   Continuous: . sodium chloride 20 mL/hr at 07/02/13 0715   XIP:[id number], albuterol, albuterol, feeding supplement (ENSURE COMPLETE), fentaNYL, guaifenesin, lidocaine, lidocaine, metoprolol, midazolam, morphine injection, ondansetron (ZOFRAN) IV, phenylephrine, silver nitrate applicators Anti-infectives   Start     Dose/Rate Route Frequency Ordered Stop   07/01/13 1600  fluconazole (DIFLUCAN) IVPB 100 mg     100 mg 50 mL/hr over 60 Minutes Intravenous Every 24 hours 07/01/13 1511     06/29/13 1045  doxycycline (VIBRAMYCIN) 100 mg in dextrose 5 % 250 mL IVPB     100 mg 125 mL/hr over 120 Minutes Intravenous Every 12 hours 06/29/13 1036     06/29/13 0800   doxycycline (VIBRA-TABS) tablet 100 mg  Status:  Discontinued     100 mg Oral Every 12 hours 06/29/13 0654 06/29/13 1036      No results found for this or any previous visit (from the past 60 hour(s)).  No results found.  Review of Systems  Constitutional:       100 pound weight loss over the last year.  All other systems reviewed and are negative.   Blood pressure 95/59, pulse 110, temperature 97.6 F (36.4 C), temperature source Oral, resp. rate 16, height 6\' 5"  (1.956 m), weight 111.3 kg (245 lb 6 oz), SpO2 93.00%. Physical Exam  Constitutional: He is oriented to  person, place, and time.  Elderly WM with significant bleeding from lesion left lower back.  He has had bleeding for some time with the sheets, and chucks all soaked in blood. BP 95/59  Pulse 110  Temp(Src) 97.6 F (36.4 C) (Oral)  Resp 16  Ht 6\' 5"  (1.956 m)  Wt 111.3 kg (245 lb 6 oz)  BMI 29.09 kg/m2  SpO2 93%   HENT:  Head: Normocephalic and atraumatic.  Nose: Nose normal.  Eyes: Conjunctivae are normal. Pupils are equal, round, and reactive to light.  Neck: Normal range of motion. Neck supple. No JVD present. No tracheal deviation present.  Cardiovascular:  tahchyardic  Respiratory: Effort normal and breath sounds normal. No respiratory distress. He has no wheezes. He has no rales.  Sore from [place] pressing on left lower posterior chest lesion.  GI: Soft. He exhibits no distension and no mass. There is no tenderness. There is no rebound and no guarding.  Genitourinary:  Condom cath in place  Musculoskeletal: He exhibits no edema and no tenderness.  Neurological: He is alert and oriented to person, place, and time. No cranial nerve deficit.  Skin:     Psychiatric: He has a normal mood and affect. His behavior is normal. Judgment and thought content normal.  Very stoic    Assessment/Plan: 1.  Bleeding mass left posterior back.  This looks like metastatic tumor to me.  It had a venous plexus in the mid  portion and this is what was bleeding.  He has at least one other superficial mass on his back between his shoulder blades that is about 4 cm in diameter, it looks like a lipoma, but is more fixed and solid. 2.  NSCLC/100 pound weight loss over the last year 3.  COPD/40 year history of tobacco use 4.  CAD/Aflutter/Mild MR, TR, and AV stenosis 5.  S/p AAA repair 05/2010 6.  Hypertension 7.  Hyperlipidemia 8.  Hypothyroid 9.  Fall with fx left hip 01/2013; marked debility since that time 10.  Hx of Polio  Plan:  I controlled the bleeding with a combination of direct pressure, and Silver nitrate.  We then dressed the site using Gelfoam which was dry, pressure dressing with 4 x 4's, and ABD pad, then covering with a large Tegaderm  so we can see if it bleeds again.  Nurses instructed to leave this in place unless it bleeds again.  We have all the supplies in the room to address this if it bleeds again.     Angeleena Dueitt 07/02/2013, 4:47 PM

## 2013-07-02 NOTE — Progress Notes (Signed)
Follow-up as requested by family; met this am with pt, daughters, Victoriano Lain son Johnthan and granddaughter Delsa Sale at bedside to discuss [facility] services, philosophy and team approach to care at home with good understanding voiced by pt/family. Dr Candiss Norse arrived during meeting and per discussion with pt/family, plan this week is to continue IV antibiotics and receive Radiation Tx; Further discussion revealed pt/family want to see how this weeks's treatment goes and have discussed needs when/if pt continues RT after discharge- family aware [facility] does not pay or arrange for transport to/from RT from home; they want to discuss with [facility] and SW to obtain information related to companies, etc for this transport if needed When pt is ready for discharge- family requesting [facility] transport home  *Please send completed GOLD DNR form home with pt    DME needs discussed, currently pt has equipment through [facility] O[facility], hospital bed, with extension and trapeze, BSC, w/c-  Only equipment need at this time is portable oral suction set up  - please contact daughter Nunzio Cory  c: [phone] to arrange delivery prior to d/c  Initial paperwork faxed to [facility]  Completed d/c summary will need to be faxed to [facility] @ [phone] when final Please notify [facility] when patient is ready to leave unit at d/c call [phone] (or [phone] if after 5 pm); [facility] information and contact numbers also given to family during visit.   Above information shared with [facility] Writer will continue to follow and be available to assist with any hospice needs; please call with any questions or concerns   Danton Sewer, RN 07/02/2013, 1:16 PM [facility] Liaison [phone]

## 2013-07-02 NOTE — Progress Notes (Signed)
[facility] Therapy Treatment Record Phone [phone]   Radiation Therapy was administered to Austin [name] on: 07/02/2013  1:21 PM and was treatment #1 out of a planned course of 10 treatments.

## 2013-07-02 NOTE — Progress Notes (Signed)
Progress Note from the Palliative Medicine Team at [place] [place]:  -patietn is alert and oriented, family at bedside  -all expressing frustration with radiation experience, "it took too long" "the table hurst my back", "they didn't tell me the truth"  -I spoke to radiation therapist and she feels tomorrow will be much easier and take less time     Objective: No Known Allergies Scheduled Meds: . digoxin  0.125 mg Intravenous Daily  . doxycycline (VIBRAMYCIN) IV  100 mg Intravenous Q12H  . fluconazole (DIFLUCAN) IV  100 mg Intravenous Q24H  . levothyroxine  37.5 mcg Intravenous Daily   Continuous Infusions: . sodium chloride 20 mL/hr at 07/02/13 0715   PRN Meds:.acetaminophen, albuterol, albuterol, feeding supplement (ENSURE COMPLETE), fentaNYL, guaifenesin, lidocaine, lidocaine, metoprolol, midazolam, morphine injection, ondansetron (ZOFRAN) IV, phenylephrine  BP 118/73  Pulse 83  Temp(Src) 97.6 F (36.4 C) (Oral)  Resp 16  Ht 6\' 5"  (1.956 m)  Wt 245 lb 6 oz (111.3 kg)  BMI 29.09 kg/m2  SpO2 97%   PPS:20 %  Pain Score: c/o pain in legs and back "that is worse from the treatment too"    Intake/Output Summary (Last 24 hours) at 07/02/13 1434 Last data filed at 07/02/13 1406  Gross per 24 hour  Intake    415 ml  Output    861 ml  Net   -446 ml       Physical Exam:  General: chronically ill appearing, NAD, constant cough and thraot clearing (non productive)  HEENT: Mm, no exudate noted, audible throat secretions Skin:  Noted cyst on back with nursing , constant ooze bloody drainage  Chest: CTA  CVS: RRR  Abdomen:soft NT +BS  Ext: without edema  Neuro: alert and oriented X3  Labs: CBC    Component Value Date/Time   WBC 6.9 06/24/2013 0518   RBC 3.68* 06/24/2013 0518   HGB 11.1* 06/24/2013 0518   HCT 32.9* 06/24/2013 0518   PLT 181 06/24/2013 0518   MCV 89.4 06/24/2013 0518   MCH 30.2 06/24/2013 0518   MCHC 33.7 06/24/2013 0518   RDW 16.9* 06/24/2013 0518   LYMPHSABS 2.1 06/21/2013 1640   MONOABS 0.7 06/21/2013 1640   EOSABS 0.1 06/21/2013 1640   BASOSABS 0.0 06/21/2013 1640    BMET    Component Value Date/Time   NA 138 06/22/2013 0440   K 3.8 06/22/2013 0440   CL 99 06/22/2013 0440   CO2 27 06/22/2013 0440   GLUCOSE 97 06/22/2013 0440   BUN 13 06/22/2013 0440   CREATININE 0.67 06/22/2013 0440   CALCIUM 9.9 06/22/2013 0440   GFRNONAA 86* 06/22/2013 0440   GFRAA >90 06/22/2013 0440    CMP     Component Value Date/Time   NA 138 06/22/2013 0440   K 3.8 06/22/2013 0440   CL 99 06/22/2013 0440   CO2 27 06/22/2013 0440   GLUCOSE 97 06/22/2013 0440   BUN 13 06/22/2013 0440   CREATININE 0.67 06/22/2013 0440   CALCIUM 9.9 06/22/2013 0440   PROT 6.2 06/22/2013 0440   ALBUMIN 2.6* 06/22/2013 0440   AST 16 06/22/2013 0440   ALT 17 06/22/2013 0440   ALKPHOS 136* 06/22/2013 0440   BILITOT 0.7 06/22/2013 0440   GFRNONAA 86* 06/22/2013 0440   GFRAA >90 06/22/2013 0440    Assessment and Plan:  1. Code Status: DNR/DNI 2. Symptom Control:                     --  Dysphagia: Sips as tolerated/ chew and spit/ aspiration precautions                      --Pain: Morphine 2 mg IV every 2 hrs prn                     --Scotch to nursing station for pateint to enjoy 30 cc qhs 3. Psycho/Social: Emotional support offered to patient and family. This has been emotionally draining for this family 4. Spiritual: Strong [employer] support,  5. Disposition: Home with hospice when medically discharged    Patient Documents Completed or Given: Document Given Completed  Advanced Directives Pkt    MOST yes   DNR    Gone from My Sight    Hard Choices yes     Time In Time Out Total Time Spent with Patient Total Overall Time  1300 1400 60 min 60 min    Greater than 50%  of this time was spent counseling and coordinating care related to the above assessment and plan.  Wadie Lessen NP  Palliative Medicine Team Team Phone # [phone] Pager [phone]   1

## 2013-07-02 NOTE — Progress Notes (Signed)
PT Cancellation Note  ___Treatment cancelled today due to medical issues with patient which prohibited therapy  _X_ Treatment cancelled today due to patient receiving procedure or test .....................Marland KitchenRadiation  ___ Treatment cancelled today due to patient's refusal to participate    Rica Koyanagi  PTA [facility]  Acute  Rehab Pager      [phone]

## 2013-07-02 NOTE — Telephone Encounter (Signed)
Rcvd call from [facility].  Returned call to [phone] - yes - KK be attending with hospice when pt goes home and yes she would like hospice MDs to assis with symptom management.  Pt case handled by Reginold Agent at [facility].

## 2013-07-02 NOTE — Progress Notes (Addendum)
Patient Demographics  Austin [name], is a 80 y.o. male, DOB - Dec 17, 1926, GNF:[id number]  Admit date - 06/21/2013   Admitting Physician [name], MD  Outpatient Primary MD for the patient is [name], MD  LOS - 11   Chief Complaint  Patient presents with  . Hematemesis        Assessment & Plan    80 y/o male with PMH of HTN, COPD, HPL, A fib, CVA, recent sepsis, recurrent pneumonia, c diff presented with hemoptysis found to have lung, esophageal mass.      1. Hemoptysis/Lung mass likely lung CA (+tobacco use); non-small cell lung cancer with either metastases to the esophagus or compressing esophagus - CT: abnormal mediastinal and left hilar mass producing mass effect upon the left main pulmonary artery mass is also resulting in esophageal obstruction with the proximal and mid portions of the esophagus seen by oncology, pulmonary and radiation oncology, status post bronchoscopy on 06/25/2013, now a candidate for comfort care and palliative care. Palliative radiation treatments to be started on 07/02/2013, family and patient understand that these are only palliative in nature and patient is expected to pass away soon.    At times coughing out lung tissue, also L, flank chest wall mass eroding through the skin, intermittent bleeds every 1-2 days.   After 1st radiation Rx bleeding more profuse, informed family and patient this could end his life.     MSSA/Candidia in Bronch washing recent H/O of Asp.PNA - doxy IV for now with diflucan      2. Dysphagia due esophageal mass lesion: failed swallow evaluation; at risk for aspiration, recent h/p aspiration PNA, esophagogram shows Near complete obstruction of the mid esophagus with irregular tapering of the esophagus atthis  level concerning for esophageal mass/ neoplasm. Per GI hold EGD; ? Need stenting (if done will need tracheal stenting as well), palliative radiation treatments in hopes of shrinking the mass in relieving some obstruction for comfort feeds.     3. Atrial fibrillation: rate Controlled, continue with metoprolol and digoxin, hold anticoagulation it due to to #1      4. Hypertension: Acceptable continue with metoprolol     5. Hyperlipedemia: Statin discontinued as goal of care now comfort.    6. COPD; h/o tobacco use; no wheezing on exam; cont bronchodilators as needed      7. History of CVA holding Xarelto due to hemoptysis;      8. History of recent left hip repair  per notes: Dr. Alvan Dame on 1/28-okay for 50% weightbearing on the left lower extremity. Patient to followup with Dr. Alvan Dame in in one month for repeat x-rays, if continues to do well with good healing, patient will likely be advanced to more weightbearing status  -xray: 1/28: Healing fracture involving the distal shaft of the left femur status post sideplate and screw device reduction. He remains severely deconditioned and requiring 2 person assistance getting out of the bed.       Prognosis is guarded,  Goal of care is comfort with gentle medical treatment and palliative radiation treatments.      Code Status: DNR  Family Communication:  Updated whole family bedside 06/29/2013, 07-02-13  Disposition Plan:  TBD   Procedures  bronchoscopy 3-9, CT  scan chest, barium swallow   Consults   PCCM, Pall care, Oncology, GI, CCS   Medications  Scheduled Meds: . digoxin  0.125 mg Intravenous Daily  . doxycycline (VIBRAMYCIN) IV  100 mg Intravenous Q12H  . fluconazole (DIFLUCAN) IV  100 mg Intravenous Q24H  . levothyroxine  37.5 mcg Intravenous Daily   Continuous Infusions: . sodium chloride 20 mL/hr at 06/25/13 1611   PRN Meds:.acetaminophen, albuterol, albuterol, feeding supplement (ENSURE COMPLETE), fentaNYL,  guaifenesin, lidocaine, lidocaine, metoprolol, midazolam, morphine injection, ondansetron (ZOFRAN) IV, phenylephrine  DVT Prophylaxis   SCDs   Lab Results  Component Value Date   PLT 181 06/24/2013    Antibiotics     Anti-infectives   Start     Dose/Rate Route Frequency Ordered Stop   07/01/13 1600  fluconazole (DIFLUCAN) IVPB 100 mg     100 mg 50 mL/hr over 60 Minutes Intravenous Every 24 hours 07/01/13 1511     06/29/13 1045  doxycycline (VIBRAMYCIN) 100 mg in dextrose 5 % 250 mL IVPB     100 mg 125 mL/hr over 120 Minutes Intravenous Every 12 hours 06/29/13 1036     06/29/13 0800  doxycycline (VIBRA-TABS) tablet 100 mg  Status:  Discontinued     100 mg Oral Every 12 hours 06/29/13 0654 06/29/13 1036          Subjective:   Austin [name] today has, No headache, No chest pain, No abdominal pain - No Nausea, No new weakness tingling or numbness, No Cough - SOB.+ve dysphagia  Objective:   Filed Vitals:   07/01/13 1427 07/01/13 1700 07/01/13 2105 07/02/13 0555  BP: 119/75  111/75 121/73  Pulse: 120 100 76 83  Temp: 98.2 F (36.8 C)  98 F (36.7 C) 97.6 F (36.4 C)  TempSrc: Oral  Oral Oral  Resp: 18  20 12   Height:      Weight:      SpO2: 93%  99% 96%    Wt Readings from Last 3 Encounters:  06/21/13 111.3 kg (245 lb 6 oz)  06/21/13 111.3 kg (245 lb 6 oz)  05/12/13 101.606 kg (224 lb)     Intake/Output Summary (Last 24 hours) at 07/02/13 1058 Last data filed at 07/02/13 0921  Gross per 24 hour  Intake 146.33 ml  Output    851 ml  Net -704.67 ml     Physical Exam  Awake Alert, Oriented X 3, No new F.N deficits, Normal affect [place].AT,PERRAL Supple Neck,No JVD, No cervical lymphadenopathy appriciated.  Symmetrical Chest wall movement, Good air movement bilaterally, CTAB, L, flank chest wall mass eroding through the skin. RRR,No Gallops,Rubs or new Murmurs, No Parasternal Heave +ve B.Sounds, Abd Soft, Non tender, No organomegaly appriciated, No rebound -  guarding or rigidity. No Cyanosis, Clubbing or edema, No new Rash or bruise     Data Review   Micro Results Recent Results (from the past 240 hour(s))  CULTURE, BAL-QUANTITATIVE     Status: None   Collection Time    06/25/13  4:52 PM      Result Value Ref Range Status   Specimen Description BRONCHIAL ALVEOLAR LAVAGE   Final   Special Requests NONE   Final   Gram Stain     Final   Value: ABUNDANT WBC PRESENT, PREDOMINANTLY PMN     RARE SQUAMOUS EPITHELIAL CELLS PRESENT     FEW GRAM POSITIVE COCCI IN PAIRS AND CHAINS     Performed at [facility]  Final   Value: 80,000 COLONIES/ML     Performed at [facility]   Culture     Final   Value: STAPHYLOCOCCUS AUREUS     Note: RIFAMPIN AND GENTAMICIN SHOULD NOT BE USED AS SINGLE DRUGS FOR TREATMENT OF STAPH INFECTIONS.     Performed at [facility]   Report Status 06/29/2013 FINAL   Final   Organism ID, Bacteria STAPHYLOCOCCUS AUREUS   Final  AFB CULTURE WITH SMEAR     Status: None   Collection Time    06/25/13  4:52 PM      Result Value Ref Range Status   Specimen Description BRONCHIAL ALVEOLAR LAVAGE   Final   Special Requests NONE   Final   ACID FAST SMEAR     Final   Value: NO ACID FAST BACILLI SEEN     Performed at [facility]   Culture     Final   Value: CULTURE WILL BE EXAMINED FOR 6 WEEKS BEFORE ISSUING A FINAL REPORT     Performed at [facility]   Report Status PENDING   Incomplete  FUNGUS CULTURE W SMEAR     Status: None   Collection Time    06/25/13  4:52 PM      Result Value Ref Range Status   Specimen Description BRONCHIAL ALVEOLAR LAVAGE   Final   Special Requests NONE   Final   Fungal Smear     Final   Value: NO YEAST OR FUNGAL ELEMENTS SEEN     Performed at [facility]   Culture     Final   Value: CANDIDA ALBICANS     Performed at [facility]   Report Status PENDING   Incomplete    Radiology Reports Ct Angio Chest Pe W/cm &/or  Wo Cm  06/22/2013   ADDENDUM REPORT: 06/22/2013 10:45  ADDENDUM: The patient has a large left hilar mass that extends into the mediastinum into the subcarinal region. Its medial portion is inseparable from the abnormal soft tissues at the junction of the mid and distal thirds of the esophagus. It is this portion of the mass that abuts the anterior aspect of the abdominal aorta. The maximal dimensions of the subcarinal mass are approximately 6.4 cm transversely x 3.2 cm AP x 3.6 cm longitudinally.  In the left axillary region there is a borderline enlarged lymph node measuring 1.4 cm in diameter.   Electronically Signed   By: David  Martinique   On: 06/22/2013 10:45   06/22/2013   CLINICAL DATA:  Hemoptysis, left lower extremity edema, recent hip surgery  EXAM: CT ANGIOGRAPHY CHEST WITH CONTRAST  TECHNIQUE: Multidetector CT imaging of the chest was performed using the standard protocol during bolus administration of intravenous contrast. Multiplanar CT image reconstructions and MIPs were obtained to evaluate the vascular anatomy.  CONTRAST:  11mL OMNIPAQUE IOHEXOL 350 MG/ML SOLN  COMPARISON:  DG CHEST 1V PORT dated 05/12/2013  FINDINGS: The contrast bolus is limited in terms of density. No definite intraluminal pulmonary artery defects are demonstrated. There is an abnormal mediastinal and left hilar mass. This is producing significant mass effect upon the proximal left main pulmonary artery, but flow distally is still demonstrated. There is also mass effect upon the esophagus with distention of the proximal esophagus to the level of the carina. There is an anterior left hilar soft tissue mass 3.2 cm transversely by 4.9 cm AP. There is a 2 cm right hilar lymph node on image  48. Definite paratracheal lymphadenopathy is not demonstrated. The cardiac chambers are mildly enlarged. There is no pericardial effusion. The caliber of the thoracic aorta is normal. These soft tissue mass abuts the anterior and left lateral surface  of the descending thoracic aorta.  There is a small left pleural effusion. Patchy areas of increased parenchymal density are present in the left upper lobe. Confluent increased density peripherally in the left lower lobe is present. On the right there is subsegmental atelectasis or early infiltrate in the lower lobe. There are emphysematous changes in both lungs.  Within the upper abdomen the observed portions of the liver and spleen appear normal. The adrenal glands are not included in the field of view. The thoracic vertebral bodies are preserved in height. The sternum appears intact. The observed portions of the ribs exhibit no acute abnormalities.  Review of the MIP images confirms the above findings.  IMPRESSION: 1. There is an abnormal mediastinal and left hilar mass producing mass effect upon the left main pulmonary artery. Classic pulmonary emboli are not demonstrated but vascular flow to the left lung is markedly impaired by the large mass. This mass is also resulting in esophageal obstruction with the proximal and mid portions of the esophagus distended with fluid and gas. The appearance is consistent with a central lung malignancy or malignancy of the distal esophagus. 2. There is a small left pleural effusion. 3. There is postobstructive atelectasis in the left lower lobe. Patchy areas of interstitial density in the left upper lobe are nonspecific but could reflect parenchymal metastatic disease. There is likely atelectasis or early infiltrate in the right lower lobe. 4. These results were called by telephone at the time of interpretation on 06/21/2013 at 6:00 PM to Dr. Tanna Furry , who verbally acknowledged these results.  Electronically Signed: By: David  Martinique On: 06/21/2013 18:02   Dg Esophagus  06/23/2013   CLINICAL DATA:  Dysphagia. Blockage seen on modified barium swallow.  EXAM: ESOPHOGRAM/BARIUM SWALLOW  TECHNIQUE: Single contrast examination was performed using  thin barium.  COMPARISON:   Chest CT 06/21/2013  FLUOROSCOPY TIME:  1 min, 35 seconds  FINDINGS: Prior to the patient ingesting additional barium, fluoroscopy demonstrates retained food/debris as well as barium from a earlier modified barium swallow study. The patient was able to take several swallows of thin barium. This mixes with the foods/ debris in the obstructed proximal to mid esophagus, with irregular tapering just below the aortic arch compatible with near complete obstructing esophageal mass. A small amount of contrast passes through this area in into the distal esophagus and stomach.  IMPRESSION: Near complete obstruction of the mid esophagus with irregular tapering of the esophagus at this level concerning for esophageal mass/ neoplasm.   Electronically Signed   By: Rolm Baptise M.D.   On: 06/23/2013 17:25   Dg Swallowing Func-speech Pathology  06/23/2013   Macario Golds, CCC-SLP     06/23/2013  4:27 PM Objective Swallowing Evaluation: Modified Barium Swallowing Study   Patient Details  Name: Austin [name] MRN: [id number] Date of Birth: 10-Dec-1926  Today's Date: 06/23/2013 Time: 5009-3818 SLP Time Calculation (min): 52 min  Past Medical History:  Past Medical History  Diagnosis Date  . Hypertension   . Hyperlipidemia   . AAA (abdominal aortic aneurysm)   . Arthritis   . CAD (coronary artery disease)   . Thyroid disease     hypothyroidism  . Dysrhythmia     atrial flutter  . Shortness of breath  a little with exertion  . Polio 1934  . Atrial flutter     chronic anticoagulation  . COPD (chronic obstructive pulmonary disease)   . History of nuclear stress test 04/2010    dipyridamole; small, mostly fixed basal to mid inferior and  inferoseptal defect (gut artifact v. scar); post stress EF 62%;  non-diagnostic for ischemia; low risk    Past Surgical History:  Past Surgical History  Procedure Laterality Date  . Abdominal aortic aneurysm repair  05/21/2010    aorto bi-iliac B[facility]  . Appendectomy  1949  . Popliteal synovial cyst  excision Left   . Joint replacement      Knee and Hip replacements  . Total hip arthroplasty Left 1994  . Knee arthroplasty Left 2004  . Total hip revision Left 12/04/2012    Procedure: LEFT TOTAL HIP REVISION;  Surgeon: Mauri Pole,  MD;  Location: [facility];  Service: Orthopedics;  Laterality: Left;  . Cardiac catheterization  2009    in [place]; 50-60% RCA  . Transthoracic echocardiogram  04/2010    EF =>55%; RV borderline dilated; LA mildly dilated; mild mitral  annular calcif & mild MR; mild TR; mild calcif of AV leaflets  with mild AV stenosis; aortic root sclerosis/calcif  . Orif femur fracture Left 01/22/2013    Procedure: OPEN REDUCTION INTERNAL FIXATION (ORIF) DISTAL FEMUR  FRACTURE;  Surgeon: Mauri Pole, MD;  Location: [facility];   Service: Orthopedics;  Laterality: Left;   HPI:  80 yo male adm to [facility] with hemopytsis.  Pt found to have  subcarinal mass with esophageal obstruction proximal and mid with  gas noted, Chest imaging showed post obstructive atelectasis LLL,  ? RLL infection/ ATX.  Pt reports 43 pound weight loss since Oct  2014 and aphonia x 2 1/2 months with gradual onset and no  worsening.  Pt also with choking on food more than drink per his  report.  RN reports pt coughing with medicine.  BSE ordered.      Assessment / Plan / Recommendation Clinical Impression  Dysphagia Diagnosis: Mild oral phase dysphagia;Moderate  pharyngeal phase dysphagia;Suspected primary esophageal  dysphagia;Moderate cervical esophageal phase dysphagia   Clinical impression: Mild oral and moderate pharyngeal-cervical  esophageal dysphagia with suspected primary esophageal deficits.   Pt has sensorimotor deficits resulting in weak muscle contraction  and pharyngeal stasis without pt awareness.  Laryngeal elevation  was very poor negatively impacting airway protection and UES  opening.   Chin tuck posture along with dry swallows decreased  amount of pharyngeal stasis of liquids.  Head turn right with  chin tuck not effective.   Laryngeal penetration (silent) of small  amount of barium noted after swallow without pt awareness-  presumed from pyriform residuals spilling into open larynx.    Pt coughed during MBS without barium visualized in larynx -  suspect possible secretion aspiration occuring based on secretion  stasis in pharynx.    SLP only tested liquids due to appearance of stasis and narrowing  in mid-esophagus that did NOT clear with multiple swallows or  liquids. Diffiuclt situation due to multifactorial dysphagia as  pt with at risk of aspiration due to oropharyngeal weakness and  suspected esophageal component (appearance of narrowing with  retained secretions above - ? from extrinsic compression from  mass.  Please note radiologist not present during MBS to confirm  findings.      Recommend medicine be given IV and pt be allowed SMALL amounts of  tsps  of thin water (? clears) with chin tuck and dry swallows for  comfort.    Rec dc intake if pt coughing or senses stasis *he did not have  sensation to poor clearance during entire test.  Pt given oral  suction and SLP demonstrated use to help clear secretions from  pharynx if able to cough.  As pt reports h/o dysphagia and  coughing up secretions x3 weeks, suspect acute on chronic  dysphagia.       Treatment Recommendation  Therapy as outlined in treatment plan below    Diet Recommendation  (? single tsps clears with chin tuck - SMALL  SINGLE TSPS ONLY)   Liquid Administration via: Spoon Medication Administration: Via alternative means Compensations: Multiple dry swallows after each bite/sip;Hard  cough after swallow Postural Changes and/or Swallow Maneuvers: Seated upright 90  degrees;Upright 30-60 min after meal;Chin tuck    Other  Recommendations Recommended Consults: MBS Oral Care Recommendations: Oral care Q4 per protocol   Follow Up Recommendations   ? Referral to address possible masses causing extrinsic  obstruction on esophagus   Frequency and Duration min 2x/week  2  weeks        General Date of Onset: 06/23/13 HPI: 80 yo male adm to [facility] with hemopytsis.  Pt found to have  subcarinal mass with esophageal obstruction proximal and mid with  gas noted, Chest imaging showed post obstructive atelectasis LLL,  ? RLL infection/ ATX.  Pt reports 43 pound weight loss since Oct  2014 and aphonia x 2 1/2 months with gradual onset and no  worsening.  Pt also with choking on food more than drink per his  report.  RN reports pt coughing with medicine.  BSE ordered.  Type of Study: Modified Barium Swallowing Study Reason for Referral: Objectively evaluate swallowing function Diet Prior to this Study: NPO Temperature Spikes Noted: No Respiratory Status: Nasal cannula History of Recent Intubation: No Behavior/Cognition: Alert;Cooperative;Pleasant mood Oral Cavity - Dentition: Missing dentition (few teeth) Oral Motor / Sensory Function: Impaired - see Bedside swallow  eval Self-Feeding Abilities: Able to feed self Patient Positioning: Upright in chair Baseline Vocal Quality: Low vocal intensity;Breathy Volitional Cough: Weak Volitional Swallow: Able to elicit Pharyngeal Secretions: Standing secretions in (comment) (pharynx)     Reason for Referral Objectively evaluate swallowing function   Oral Phase Oral Preparation/Oral Phase Oral Phase: Impaired Oral - Nectar Oral - Nectar Teaspoon: Reduced posterior  propulsion;Lingual/palatal residue;Weak lingual manipulation Oral - Nectar Straw: Reduced posterior propulsion;Lingual/palatal  residue;Weak lingual manipulation Oral - Thin Oral - Thin Teaspoon: Reduced posterior  propulsion;Lingual/palatal residue;Weak lingual manipulation Oral - Thin Cup: Reduced posterior propulsion;Lingual/palatal  residue;Weak lingual manipulation Oral - Thin Straw: Reduced posterior propulsion;Lingual/palatal  residue;Weak lingual manipulation   Pharyngeal Phase Pharyngeal Phase Pharyngeal Phase: Impaired Pharyngeal - Nectar Pharyngeal - Nectar Teaspoon: Reduced epiglottic   inversion;Reduced pharyngeal peristalsis;Reduced anterior  laryngeal mobility;Reduced laryngeal elevation;Pharyngeal residue  - pyriform sinuses;Pharyngeal residue - valleculae;Trace  aspiration;Pharyngeal residue - cp segment Pharyngeal - Nectar Cup: Reduced epiglottic inversion;Reduced  pharyngeal peristalsis;Reduced anterior laryngeal  mobility;Reduced laryngeal elevation;Pharyngeal residue -  valleculae;Pharyngeal residue - pyriform sinuses;Reduced tongue  base retraction;Pharyngeal residue - cp  segment;Penetration/Aspiration after swallow Penetration/Aspiration details (nectar cup): Material enters  airway, remains ABOVE vocal cords and not ejected out Pharyngeal - Thin Pharyngeal - Thin Teaspoon: Reduced epiglottic inversion;Reduced  pharyngeal peristalsis;Reduced anterior laryngeal  mobility;Reduced laryngeal elevation;Pharyngeal residue -  valleculae;Pharyngeal residue - pyriform sinuses;Reduced tongue  base retraction;Pharyngeal residue - cp segment Pharyngeal - Thin Cup:  Reduced epiglottic inversion;Reduced  pharyngeal peristalsis;Reduced anterior laryngeal  mobility;Reduced laryngeal elevation;Pharyngeal residue -  valleculae;Pharyngeal residue - pyriform sinuses;Reduced tongue  base retraction;Pharyngeal residue - cp segment Pharyngeal - Thin Straw: Reduced epiglottic inversion;Reduced  pharyngeal peristalsis;Reduced anterior laryngeal  mobility;Reduced laryngeal elevation;Pharyngeal residue -  valleculae;Pharyngeal residue - pyriform sinuses;Reduced tongue  base retraction;Pharyngeal residue - cp segment Pharyngeal Phase - Comment Pharyngeal Comment: chin tuck with liquids decreases amount of  pharyngeal accumulation, cued dry swallows - pt able to cough and  "hock" up mild amount of stasis x1, does not sense pharygneal  stasis  Cervical Esophageal Phase    GO    Cervical Esophageal Phase Cervical Esophageal Phase: Impaired Cervical Esophageal Phase - Nectar Nectar Teaspoon: Reduced cricopharyngeal  relaxation Nectar Cup: Reduced cricopharyngeal relaxation Cervical Esophageal Phase - Thin Thin Teaspoon: Reduced cricopharyngeal relaxation Thin Cup: Reduced cricopharyngeal relaxation Thin Straw: Reduced cricopharyngeal relaxation         Luanna Salk, MS [facility] SLP [phone]     CBC No results found for this basename: WBC, HGB, HCT, PLT, MCV, MCH, MCHC, RDW, NEUTRABS, LYMPHSABS, MONOABS, EOSABS, BASOSABS, BANDABS, BANDSABD,  in the last 168 hours  Chemistries  No results found for this basename: NA, K, CL, CO2, GLUCOSE, BUN, CREATININE, GFRCGP, CALCIUM, MG, AST, ALT, ALKPHOS, BILITOT,  in the last 168 hours ------------------------------------------------------------------------------------------------------------------ estimated creatinine clearance is 95.3 ml/min (by C-G formula based on Cr of 0.67). ------------------------------------------------------------------------------------------------------------------ No results found for this basename: HGBA1C,  in the last 72 hours ------------------------------------------------------------------------------------------------------------------ No results found for this basename: CHOL, HDL, LDLCALC, TRIG, CHOLHDL, LDLDIRECT,  in the last 72 hours ------------------------------------------------------------------------------------------------------------------ No results found for this basename: TSH, T4TOTAL, FREET3, T3FREE, THYROIDAB,  in the last 72 hours ------------------------------------------------------------------------------------------------------------------ No results found for this basename: VITAMINB12, FOLATE, FERRITIN, TIBC, IRON, RETICCTPCT,  in the last 72 hours  Coagulation profile No results found for this basename: INR, PROTIME,  in the last 168 hours  No results found for this basename: DDIMER,  in the last 72 hours  Cardiac Enzymes No results found for this basename: CK, CKMB, TROPONINI, MYOGLOBIN,  in the last 168  hours ------------------------------------------------------------------------------------------------------------------ No components found with this basename: POCBNP,      Time Spent in minutes   35   SINGH,PRASHANT K M.D on 07/02/2013 at 10:58 AM  Between 7am to 7pm - Pager - [phone]  After 7pm go to [email or web address] - password [email or web address]  And look for the night coverage person covering for me after hours  [facility] Office  [phone]

## 2013-07-02 NOTE — Progress Notes (Signed)
07/02/13 1600  Clinical Encounter Type  Visited With Patient and family together (daughters Nunzio Cory and Helene Kelp (?), granddaughter)  Visit Type Spiritual support;Social support  Referral From (palliative care list)  Spiritual Encounters  Spiritual Needs Emotional  Stress Factors  Patient Stress Factors Health changes  Family Stress Factors Loss of control   Rounded on pt as nursing staff was trying to stop bleeding and physician arrived.  Provided pastoral presence and emotional support as family processed the stressfulness of pt's illness and decline.  "He doesn't deserve this" was a common refrain, said with love, compassion, and tears.  Please page chaplain whenever further support needed/desired:  [phone].  Thank you.  [place], [place]

## 2013-07-03 ENCOUNTER — Ambulatory Visit
Admit: 2013-07-03 | Discharge: 2013-07-03 | Disposition: A | Discharge status: Home / Self Care | Payer: Medicare Other | Attending: Radiation Oncology | Admitting: Radiation Oncology

## 2013-07-03 DIAGNOSIS — R531 Weakness: Secondary | ICD-10-CM

## 2013-07-03 DIAGNOSIS — R5381 Other malaise: Secondary | ICD-10-CM

## 2013-07-03 DIAGNOSIS — R52 Pain, unspecified: Secondary | ICD-10-CM

## 2013-07-03 DIAGNOSIS — R5383 Other fatigue: Secondary | ICD-10-CM

## 2013-07-03 MED ORDER — LORAZEPAM 1 MG PO TABS: 1.0000 mg | ORAL_TABLET | Freq: Four times a day (QID) | ORAL | Status: AC | PRN

## 2013-07-03 MED ORDER — DOXYCYCLINE HYCLATE 50 MG PO CAPS: 100.0000 mg | ORAL_CAPSULE | Freq: Two times a day (BID) | ORAL | Status: AC

## 2013-07-03 MED ORDER — FLUCONAZOLE 100 MG PO TABS: 100.0000 mg | ORAL_TABLET | Freq: Every day | ORAL | Status: AC

## 2013-07-03 MED ORDER — ALBUTEROL SULFATE (2.5 MG/3ML) 0.083% IN NEBU: 2.5000 mg | INHALATION_SOLUTION | Freq: Four times a day (QID) | RESPIRATORY_TRACT | Status: AC | PRN

## 2013-07-03 MED ORDER — MORPHINE SULFATE (CONCENTRATE) 10 MG /0.5 ML PO SOLN: 10.0000 mg | ORAL | Status: DC | PRN

## 2013-07-03 NOTE — Progress Notes (Signed)
Patient seen and examined.  Agree with PA's note. Radiation markings are next to the bleeding lesion.  If it is tumor and in the radiation portal, bleeding may continue as a result of tumor necrosis.

## 2013-07-03 NOTE — Progress Notes (Signed)
CSW assisting with transportation needs. Spoke with Nunzio Cory to confirm pt's home address for P-TAR transport at d/c. Family will arrange P-TAR transport to appts from home. [facility] phone number provided to [facility].  Werner Lean LCSW [phone]

## 2013-07-03 NOTE — Progress Notes (Signed)
[facility] Therapy Treatment Record Phone [phone]   Radiation Therapy was administered to Austin [name] on: 07/03/2013  11:04 AM and was treatment # 2 out of a planned course of 10 treatments.

## 2013-07-03 NOTE — Progress Notes (Signed)
Physical Therapy Discharge Patient Details Name: Austin [name] MRN: [id number] DOB: [birth year] Today's Date: 07/03/2013 Time:  - 7:01    Patient discharged from PT services secondary to medical decline - will need to re-order PT to resume therapy services.Pt is at Palliative care level, onset bleeding back  Wound, coughing up lung tissue per notes. Pt is not a candidate for mobility at this time. Please see latest therapy progress note for current level of functioning and progress toward goals.     GP     Marcelino Freestone PT [phone]  07/03/2013, 7:00 AM

## 2013-07-03 NOTE — Consult Note (Signed)
I have reviewed and discussed the care of this patient in detail with the nurse practitioner including pertinent patient records, physical exam findings and data. I agree with details of this encounter.  

## 2013-07-03 NOTE — Progress Notes (Signed)
Tech inserted catheter for pt. Catheter inserted without difficulty; no resistance met. Immediate urine return of 300cc with white sediment and some blood-tinged urine. Pt reports feelings of relief following catheter insertion. Will continue to monitor.

## 2013-07-03 NOTE — Progress Notes (Signed)
Patient Demographics  Austin [name], is a 80 y.o. male, DOB - [birth year], BMW:[id number]  Admit date - 06/21/2013   Admitting Physician [name], MD  Outpatient Primary MD for the patient is [name], MD  LOS - 12   Chief Complaint  Patient presents with  . Hematemesis        Assessment & Plan    80 y/o male with PMH of HTN, COPD, HPL, A fib, CVA, recent sepsis, recurrent pneumonia, c diff presented with hemoptysis found to have non-small cell lung cancer with compression to esophagus, eroding left-sided chest wall. Now full comfort care with palliative care following, undergoing palliative radiation treatments.      1. Hemoptysis/Lung mass likely lung CA (+tobacco use); non-small cell lung cancer compressing esophagus, noting left-sided chest wall with close proximity to pulmonary artery -  seen by oncology, pulmonary and radiation oncology, status post bronchoscopy on 06/25/2013, now a candidate for comfort care and palliative care. Palliative radiation treatments started on 07/02/2013, family and patient understand that these are only palliative in nature and patient is expected to pass away soon. Family being prepared to be discharged home after 3 radiation treatments, family wishing to stay here for 2 more weeks which I have told them that would be difficult.    At times coughing out lung tissue, also L, flank chest wall mass eroding through the skin, intermittent bleeds every 1-2 days. He needed cauterization of his left-sided chest wall bleed on 07/02/2013. I have told the patient and family that he can get catastrophic external or internal bleed which might be life ending.    MSSA/Candidia in Bronch washing recent H/O of Asp.PNA - doxy IV for now with  diflucan      2. Dysphagia due esophageal mass lesion: failed swallow evaluation; at risk for aspiration, recent h/p aspiration PNA, esophagogram shows Near complete obstruction of the mid esophagus with irregular tapering of the esophagus atthis level concerning for esophageal mass/ neoplasm. Per GI hold EGD; ? Need stenting (if done will need tracheal stenting as well), palliative radiation treatments in hopes of shrinking the mass in relieving some obstruction for comfort feeds.     3. Atrial fibrillation: No supportive care only. Beta blocker for palpitations     4. Hypertension: Acceptable continue with metoprolol for palpitations.    5. Hyperlipedemia: Statin discontinued as goal of care now comfort.    6. COPD; h/o tobacco use; no wheezing on exam; cont bronchodilators as needed      7. History of CVA holding Xarelto due to hemoptysis;      8. History of recent left hip repair  per notes: Dr. Alvan Dame on 1/28-okay for 50% weightbearing on the left lower extremity. Patient to followup with Dr. Alvan Dame in in one month for repeat x-rays, if continues to do well with good healing, patient will likely be advanced to more weightbearing status .Xray: 1/28: Healing fracture involving the distal shaft of the left femur status post sideplate and screw device reduction. He remains severely deconditioned and requiring 2 person assistance getting out of the bed. Does not want to pursue ongoing physical therapy.       Prognosis is guarded,  Goal of care is comfort with gentle medical treatment  and palliative radiation treatments.      Code Status: DNR  Family Communication:  Updated whole family bedside 06/29/2013, 07-02-13  Disposition Plan:  TBD   Procedures  bronchoscopy 3-9, CT scan chest, barium swallow, addition treatment started 07/02/2013   Consults   PCCM, Pall care, Oncology, GI, CCS   Medications  Scheduled Meds: . digoxin  0.125 mg Intravenous Daily  .  doxycycline (VIBRAMYCIN) IV  100 mg Intravenous Q12H  . fluconazole (DIFLUCAN) IV  100 mg Intravenous Q24H  . levothyroxine  37.5 mcg Intravenous Daily   Continuous Infusions: . sodium chloride 20 mL/hr at 07/03/13 0900   PRN Meds:.acetaminophen, albuterol, albuterol, feeding supplement (ENSURE COMPLETE), fentaNYL, guaifenesin, lidocaine, lidocaine, metoprolol, midazolam, morphine injection, ondansetron (ZOFRAN) IV, phenylephrine, silver nitrate applicators  DVT Prophylaxis   SCDs   Lab Results  Component Value Date   PLT 181 06/24/2013    Antibiotics     Anti-infectives   Start     Dose/Rate Route Frequency Ordered Stop   07/03/13 0000  doxycycline (VIBRAMYCIN) 50 MG capsule     100 mg Oral 2 times daily 07/03/13 0830     07/03/13 0000  fluconazole (DIFLUCAN) 100 MG tablet     100 mg Oral Daily 07/03/13 0830     07/01/13 1600  fluconazole (DIFLUCAN) IVPB 100 mg     100 mg 50 mL/hr over 60 Minutes Intravenous Every 24 hours 07/01/13 1511     06/29/13 1045  doxycycline (VIBRAMYCIN) 100 mg in dextrose 5 % 250 mL IVPB     100 mg 125 mL/hr over 120 Minutes Intravenous Every 12 hours 06/29/13 1036     06/29/13 0800  doxycycline (VIBRA-TABS) tablet 100 mg  Status:  Discontinued     100 mg Oral Every 12 hours 06/29/13 0654 06/29/13 1036          Subjective:   [name] today has, No headache, No chest pain, No abdominal pain - No Nausea, No new weakness tingling or numbness, No Cough - SOB.+ve dysphagia  Objective:   Filed Vitals:   07/02/13 1628 07/02/13 1656 07/02/13 2100 07/03/13 0457  BP: 95/59 91/76 102/73 135/76  Pulse: 110 123 116 125  Temp:   97.2 F (36.2 C) 96.3 F (35.7 C)  TempSrc:   Axillary Axillary  Resp:   16 16  Height:      Weight:      SpO2: 93%  92% 92%    Wt Readings from Last 3 Encounters:  06/21/13 111.3 kg (245 lb 6 oz)  06/21/13 111.3 kg (245 lb 6 oz)  05/12/13 101.606 kg (224 lb)     Intake/Output Summary (Last 24 hours) at  07/03/13 1143 Last data filed at 07/03/13 1000  Gross per 24 hour  Intake 1101.33 ml  Output    210 ml  Net 891.33 ml     Physical Exam  Awake Alert, Oriented X 3, No new F.N deficits, Normal affect [place].AT,PERRAL Supple Neck,No JVD, No cervical lymphadenopathy appriciated.  Symmetrical Chest wall movement, Good air movement bilaterally, CTAB, L, flank chest wall mass eroding through the skin. RRR,No Gallops,Rubs or new Murmurs, No Parasternal Heave +ve B.Sounds, Abd Soft, Non tender, No organomegaly appriciated, No rebound - guarding or rigidity. No Cyanosis, Clubbing or edema, No new Rash or bruise     Data Review   Micro Results Recent Results (from the past 240 hour(s))  CULTURE, BAL-QUANTITATIVE     Status: None   Collection Time    06/25/13  4:52 PM      Result Value Ref Range Status   Specimen Description BRONCHIAL ALVEOLAR LAVAGE   Final   Special Requests NONE   Final   Gram Stain     Final   Value: ABUNDANT WBC PRESENT, PREDOMINANTLY PMN     RARE SQUAMOUS EPITHELIAL CELLS PRESENT     FEW GRAM POSITIVE COCCI IN PAIRS AND CHAINS     Performed at [facility]     Final   Value: 80,000 COLONIES/ML     Performed at [facility]   Culture     Final   Value: STAPHYLOCOCCUS AUREUS     Note: RIFAMPIN AND GENTAMICIN SHOULD NOT BE USED AS SINGLE DRUGS FOR TREATMENT OF STAPH INFECTIONS.     Performed at [facility]   Report Status 06/29/2013 FINAL   Final   Organism ID, Bacteria STAPHYLOCOCCUS AUREUS   Final  AFB CULTURE WITH SMEAR     Status: None   Collection Time    06/25/13  4:52 PM      Result Value Ref Range Status   Specimen Description BRONCHIAL ALVEOLAR LAVAGE   Final   Special Requests NONE   Final   ACID FAST SMEAR     Final   Value: NO ACID FAST BACILLI SEEN     Performed at [facility]   Culture     Final   Value: CULTURE WILL BE EXAMINED FOR 6 WEEKS BEFORE ISSUING A FINAL REPORT     Performed at [facility]   Report Status PENDING   Incomplete  FUNGUS CULTURE W SMEAR     Status: None   Collection Time    06/25/13  4:52 PM      Result Value Ref Range Status   Specimen Description BRONCHIAL ALVEOLAR LAVAGE   Final   Special Requests NONE   Final   Fungal Smear     Final   Value: NO YEAST OR FUNGAL ELEMENTS SEEN     Performed at [facility]   Culture     Final   Value: CANDIDA ALBICANS     Performed at [facility]   Report Status PENDING   Incomplete    Radiology Reports Ct Angio Chest Pe W/cm &/or Wo Cm  06/22/2013   ADDENDUM REPORT: 06/22/2013 10:45  ADDENDUM: The patient has a large left hilar mass that extends into the mediastinum into the subcarinal region. Its medial portion is inseparable from the abnormal soft tissues at the junction of the mid and distal thirds of the esophagus. It is this portion of the mass that abuts the anterior aspect of the abdominal aorta. The maximal dimensions of the subcarinal mass are approximately 6.4 cm transversely x 3.2 cm AP x 3.6 cm longitudinally.  In the left axillary region there is a borderline enlarged lymph node measuring 1.4 cm in diameter.   Electronically Signed   By: David  Martinique   On: 06/22/2013 10:45   06/22/2013   CLINICAL DATA:  Hemoptysis, left lower extremity edema, recent hip surgery  EXAM: CT ANGIOGRAPHY CHEST WITH CONTRAST  TECHNIQUE: Multidetector CT imaging of the chest was performed using the standard protocol during bolus administration of intravenous contrast. Multiplanar CT image reconstructions and MIPs were obtained to evaluate the vascular anatomy.  CONTRAST:  171mL OMNIPAQUE IOHEXOL 350 MG/ML SOLN  COMPARISON:  DG CHEST 1V PORT dated 05/12/2013  FINDINGS: The contrast bolus is limited in terms  of density. No definite intraluminal pulmonary artery defects are demonstrated. There is an abnormal mediastinal and left hilar mass. This is producing significant mass effect upon the proximal left main pulmonary  artery, but flow distally is still demonstrated. There is also mass effect upon the esophagus with distention of the proximal esophagus to the level of the carina. There is an anterior left hilar soft tissue mass 3.2 cm transversely by 4.9 cm AP. There is a 2 cm right hilar lymph node on image 48. Definite paratracheal lymphadenopathy is not demonstrated. The cardiac chambers are mildly enlarged. There is no pericardial effusion. The caliber of the thoracic aorta is normal. These soft tissue mass abuts the anterior and left lateral surface of the descending thoracic aorta.  There is a small left pleural effusion. Patchy areas of increased parenchymal density are present in the left upper lobe. Confluent increased density peripherally in the left lower lobe is present. On the right there is subsegmental atelectasis or early infiltrate in the lower lobe. There are emphysematous changes in both lungs.  Within the upper abdomen the observed portions of the liver and spleen appear normal. The adrenal glands are not included in the field of view. The thoracic vertebral bodies are preserved in height. The sternum appears intact. The observed portions of the ribs exhibit no acute abnormalities.  Review of the MIP images confirms the above findings.  IMPRESSION: 1. There is an abnormal mediastinal and left hilar mass producing mass effect upon the left main pulmonary artery. Classic pulmonary emboli are not demonstrated but vascular flow to the left lung is markedly impaired by the large mass. This mass is also resulting in esophageal obstruction with the proximal and mid portions of the esophagus distended with fluid and gas. The appearance is consistent with a central lung malignancy or malignancy of the distal esophagus. 2. There is a small left pleural effusion. 3. There is postobstructive atelectasis in the left lower lobe. Patchy areas of interstitial density in the left upper lobe are nonspecific but could reflect  parenchymal metastatic disease. There is likely atelectasis or early infiltrate in the right lower lobe. 4. These results were called by telephone at the time of interpretation on 06/21/2013 at 6:00 PM to Dr. Tanna Furry , who verbally acknowledged these results.  Electronically Signed: By: David  Martinique On: 06/21/2013 18:02   Dg Esophagus  06/23/2013   CLINICAL DATA:  Dysphagia. Blockage seen on modified barium swallow.  EXAM: ESOPHOGRAM/BARIUM SWALLOW  TECHNIQUE: Single contrast examination was performed using  thin barium.  COMPARISON:  Chest CT 06/21/2013  FLUOROSCOPY TIME:  1 min, 35 seconds  FINDINGS: Prior to the patient ingesting additional barium, fluoroscopy demonstrates retained food/debris as well as barium from a earlier modified barium swallow study. The patient was able to take several swallows of thin barium. This mixes with the foods/ debris in the obstructed proximal to mid esophagus, with irregular tapering just below the aortic arch compatible with near complete obstructing esophageal mass. A small amount of contrast passes through this area in into the distal esophagus and stomach.  IMPRESSION: Near complete obstruction of the mid esophagus with irregular tapering of the esophagus at this level concerning for esophageal mass/ neoplasm.   Electronically Signed   By: Rolm Baptise M.D.   On: 06/23/2013 17:25   Dg Swallowing Func-speech Pathology  06/23/2013   Macario Golds, CCC-SLP     06/23/2013  4:27 PM Objective Swallowing Evaluation: Modified Barium Swallowing Study   Patient  Details  Name: Austin [name] MRN: [id number] Date of Birth: Jun 06, 1926  Today's Date: 06/23/2013 Time: 9024-0973 SLP Time Calculation (min): 52 min  Past Medical History:  Past Medical History  Diagnosis Date  . Hypertension   . Hyperlipidemia   . AAA (abdominal aortic aneurysm)   . Arthritis   . CAD (coronary artery disease)   . Thyroid disease     hypothyroidism  . Dysrhythmia     atrial flutter  . Shortness of  breath     a little with exertion  . Polio 1934  . Atrial flutter     chronic anticoagulation  . COPD (chronic obstructive pulmonary disease)   . History of nuclear stress test 04/2010    dipyridamole; small, mostly fixed basal to mid inferior and  inferoseptal defect (gut artifact v. scar); post stress EF 62%;  non-diagnostic for ischemia; low risk    Past Surgical History:  Past Surgical History  Procedure Laterality Date  . Abdominal aortic aneurysm repair  05/21/2010    aorto bi-iliac BPG  . Appendectomy  1949  . Popliteal synovial cyst excision Left   . Joint replacement      Knee and Hip replacements  . Total hip arthroplasty Left 1994  . Knee arthroplasty Left 2004  . Total hip revision Left 12/04/2012    Procedure: LEFT TOTAL HIP REVISION;  Surgeon: Mauri Pole,  MD;  Location: [facility];  Service: Orthopedics;  Laterality: Left;  . Cardiac catheterization  2009    in [place]; 50-60% RCA  . Transthoracic echocardiogram  04/2010    EF =>55%; RV borderline dilated; LA mildly dilated; mild mitral  annular calcif & mild MR; mild TR; mild calcif of AV leaflets  with mild AV stenosis; aortic root sclerosis/calcif  . Orif femur fracture Left 01/22/2013    Procedure: OPEN REDUCTION INTERNAL FIXATION (ORIF) DISTAL FEMUR  FRACTURE;  Surgeon: Mauri Pole, MD;  Location: [place];   Service: Orthopedics;  Laterality: Left;   HPI:  80 yo male adm to [facility] with hemopytsis.  Pt found to have  subcarinal mass with esophageal obstruction proximal and mid with  gas noted, Chest imaging showed post obstructive atelectasis LLL,  ? RLL infection/ ATX.  Pt reports 43 pound weight loss since Oct  2014 and aphonia x 2 1/2 months with gradual onset and no  worsening.  Pt also with choking on food more than drink per his  report.  RN reports pt coughing with medicine.  BSE ordered.      Assessment / Plan / Recommendation Clinical Impression  Dysphagia Diagnosis: Mild oral phase dysphagia;Moderate  pharyngeal phase dysphagia;Suspected primary  esophageal  dysphagia;Moderate cervical esophageal phase dysphagia   Clinical impression: Mild oral and moderate pharyngeal-cervical  esophageal dysphagia with suspected primary esophageal deficits.   Pt has sensorimotor deficits resulting in weak muscle contraction  and pharyngeal stasis without pt awareness.  Laryngeal elevation  was very poor negatively impacting airway protection and UES  opening.   Chin tuck posture along with dry swallows decreased  amount of pharyngeal stasis of liquids.  Head turn right with  chin tuck not effective.  Laryngeal penetration (silent) of small  amount of barium noted after swallow without pt awareness-  presumed from pyriform residuals spilling into open larynx.    Pt coughed during MBS without barium visualized in larynx -  suspect possible secretion aspiration occuring based on secretion  stasis in pharynx.    SLP only tested liquids due to  appearance of stasis and narrowing  in mid-esophagus that did NOT clear with multiple swallows or  liquids. Diffiuclt situation due to multifactorial dysphagia as  pt with at risk of aspiration due to oropharyngeal weakness and  suspected esophageal component (appearance of narrowing with  retained secretions above - ? from extrinsic compression from  mass.  Please note radiologist not present during MBS to confirm  findings.      Recommend medicine be given IV and pt be allowed SMALL amounts of  tsps of thin water (? clears) with chin tuck and dry swallows for  comfort.    Rec dc intake if pt coughing or senses stasis *he did not have  sensation to poor clearance during entire test.  Pt given oral  suction and SLP demonstrated use to help clear secretions from  pharynx if able to cough.  As pt reports h/o dysphagia and  coughing up secretions x3 weeks, suspect acute on chronic  dysphagia.       Treatment Recommendation  Therapy as outlined in treatment plan below    Diet Recommendation  (? single tsps clears with chin tuck - SMALL  SINGLE  TSPS ONLY)   Liquid Administration via: Spoon Medication Administration: Via alternative means Compensations: Multiple dry swallows after each bite/sip;Hard  cough after swallow Postural Changes and/or Swallow Maneuvers: Seated upright 90  degrees;Upright 30-60 min after meal;Chin tuck    Other  Recommendations Recommended Consults: MBS Oral Care Recommendations: Oral care Q4 per protocol   Follow Up Recommendations   ? Referral to address possible masses causing extrinsic  obstruction on esophagus   Frequency and Duration min 2x/week  2 weeks        General Date of Onset: 06/23/13 HPI: 80 yo male adm to [facility] with hemopytsis.  Pt found to have  subcarinal mass with esophageal obstruction proximal and mid with  gas noted, Chest imaging showed post obstructive atelectasis LLL,  ? RLL infection/ ATX.  Pt reports 43 pound weight loss since Oct  2014 and aphonia x 2 1/2 months with gradual onset and no  worsening.  Pt also with choking on food more than drink per his  report.  RN reports pt coughing with medicine.  BSE ordered.  Type of Study: Modified Barium Swallowing Study Reason for Referral: Objectively evaluate swallowing function Diet Prior to this Study: NPO Temperature Spikes Noted: No Respiratory Status: Nasal cannula History of Recent Intubation: No Behavior/Cognition: Alert;Cooperative;Pleasant mood Oral Cavity - Dentition: Missing dentition (few teeth) Oral Motor / Sensory Function: Impaired - see Bedside swallow  eval Self-Feeding Abilities: Able to feed self Patient Positioning: Upright in chair Baseline Vocal Quality: Low vocal intensity;Breathy Volitional Cough: Weak Volitional Swallow: Able to elicit Pharyngeal Secretions: Standing secretions in (comment) (pharynx)     Reason for Referral Objectively evaluate swallowing function   Oral Phase Oral Preparation/Oral Phase Oral Phase: Impaired Oral - Nectar Oral - Nectar Teaspoon: Reduced posterior  propulsion;Lingual/palatal residue;Weak lingual  manipulation Oral - Nectar Straw: Reduced posterior propulsion;Lingual/palatal  residue;Weak lingual manipulation Oral - Thin Oral - Thin Teaspoon: Reduced posterior  propulsion;Lingual/palatal residue;Weak lingual manipulation Oral - Thin Cup: Reduced posterior propulsion;Lingual/palatal  residue;Weak lingual manipulation Oral - Thin Straw: Reduced posterior propulsion;Lingual/palatal  residue;Weak lingual manipulation   Pharyngeal Phase Pharyngeal Phase Pharyngeal Phase: Impaired Pharyngeal - Nectar Pharyngeal - Nectar Teaspoon: Reduced epiglottic  inversion;Reduced pharyngeal peristalsis;Reduced anterior  laryngeal mobility;Reduced laryngeal elevation;Pharyngeal residue  - pyriform sinuses;Pharyngeal residue - valleculae;Trace  aspiration;Pharyngeal residue - cp segment Pharyngeal -  Nectar Cup: Reduced epiglottic inversion;Reduced  pharyngeal peristalsis;Reduced anterior laryngeal  mobility;Reduced laryngeal elevation;Pharyngeal residue -  valleculae;Pharyngeal residue - pyriform sinuses;Reduced tongue  base retraction;Pharyngeal residue - cp  segment;Penetration/Aspiration after swallow Penetration/Aspiration details (nectar cup): Material enters  airway, remains ABOVE vocal cords and not ejected out Pharyngeal - Thin Pharyngeal - Thin Teaspoon: Reduced epiglottic inversion;Reduced  pharyngeal peristalsis;Reduced anterior laryngeal  mobility;Reduced laryngeal elevation;Pharyngeal residue -  valleculae;Pharyngeal residue - pyriform sinuses;Reduced tongue  base retraction;Pharyngeal residue - cp segment Pharyngeal - Thin Cup: Reduced epiglottic inversion;Reduced  pharyngeal peristalsis;Reduced anterior laryngeal  mobility;Reduced laryngeal elevation;Pharyngeal residue -  valleculae;Pharyngeal residue - pyriform sinuses;Reduced tongue  base retraction;Pharyngeal residue - cp segment Pharyngeal - Thin Straw: Reduced epiglottic inversion;Reduced  pharyngeal peristalsis;Reduced anterior laryngeal  mobility;Reduced  laryngeal elevation;Pharyngeal residue -  valleculae;Pharyngeal residue - pyriform sinuses;Reduced tongue  base retraction;Pharyngeal residue - cp segment Pharyngeal Phase - Comment Pharyngeal Comment: chin tuck with liquids decreases amount of  pharyngeal accumulation, cued dry swallows - pt able to cough and  "hock" up mild amount of stasis x1, does not sense pharygneal  stasis  Cervical Esophageal Phase    GO    Cervical Esophageal Phase Cervical Esophageal Phase: Impaired Cervical Esophageal Phase - Nectar Nectar Teaspoon: Reduced cricopharyngeal relaxation Nectar Cup: Reduced cricopharyngeal relaxation Cervical Esophageal Phase - Thin Thin Teaspoon: Reduced cricopharyngeal relaxation Thin Cup: Reduced cricopharyngeal relaxation Thin Straw: Reduced cricopharyngeal relaxation         Luanna Salk, MS [facility] SLP [phone]     CBC No results found for this basename: WBC, HGB, HCT, PLT, MCV, MCH, MCHC, RDW, NEUTRABS, LYMPHSABS, MONOABS, EOSABS, BASOSABS, BANDABS, BANDSABD,  in the last 168 hours  Chemistries  No results found for this basename: NA, K, CL, CO2, GLUCOSE, BUN, CREATININE, GFRCGP, CALCIUM, MG, AST, ALT, ALKPHOS, BILITOT,  in the last 168 hours ------------------------------------------------------------------------------------------------------------------ estimated creatinine clearance is 95.3 ml/min (by C-G formula based on Cr of 0.67). ------------------------------------------------------------------------------------------------------------------ No results found for this basename: HGBA1C,  in the last 72 hours ------------------------------------------------------------------------------------------------------------------ No results found for this basename: CHOL, HDL, LDLCALC, TRIG, CHOLHDL, LDLDIRECT,  in the last 72 hours ------------------------------------------------------------------------------------------------------------------ No results found for this basename: TSH,  T4TOTAL, FREET3, T3FREE, THYROIDAB,  in the last 72 hours ------------------------------------------------------------------------------------------------------------------ No results found for this basename: VITAMINB12, FOLATE, FERRITIN, TIBC, IRON, RETICCTPCT,  in the last 72 hours  Coagulation profile No results found for this basename: INR, PROTIME,  in the last 168 hours  No results found for this basename: DDIMER,  in the last 72 hours  Cardiac Enzymes No results found for this basename: CK, CKMB, TROPONINI, MYOGLOBIN,  in the last 168 hours ------------------------------------------------------------------------------------------------------------------ No components found with this basename: POCBNP,      Time Spent in minutes   35   Emily Forse K M.D on 07/03/2013 at 11:43 AM  Between 7am to 7pm - Pager - [phone]  After 7pm go to [email or web address] - password [email or web address]  And look for the night coverage person covering for me after hours  [facility]  [phone]

## 2013-07-03 NOTE — Progress Notes (Addendum)
Follow-up discussion with family regarding decisions for home with hospice; noted events of last evening with bleeding from back mass; pt seen at bedside stated he has had a 'rough day'; going for radiation treatment/especially transport was more painful than expected- pt has required bolus doses of IV Morphine yesterday and today; discussed consideration of premedicating prior to transport for comfort; pt noticably drawn, voice more hoarse/weak today; briefly discussed away from pt options of not continuing Radiation Tx after discharge as well as [place] for care should pt's physical care needs become too much at home; son Kadrian indicated his dad would not consider anything but going "to his home" and the family wants to honor these wishes; the family is going to discuss options of not continuing RT but at this time are gathering necessary information to make arrangements for transport to/from RT when pt gets home as he will require stretcher transport  - son Asheton asked Probation officer about insertion of Foley catheter and requests this be left in at d/c for comfort, information was shared with PMT NP who will follow up * DME needs pt will require fully electric hospital bed with AP&P mattress pad; with bed extender as pt is 6'5" - currently pt has a bed with a trapeze bar this will need to be added onto the new bed; also pt will need an oral suction set-up for the home - currently pt has O2 & other DME through [facility] - [place] representative [place] notified and [facility] Edwin Cap made aware DME- will need to be in place tomorrow to plan for d/c home Thursday *Please call dtr Nunzio Cory c:[phone] to arrange DME delivery for tomorrow Wednesday [facility] will continue to follow -please call with any questions  Danton Sewer, RN [facility] Liaison [phone]

## 2013-07-03 NOTE — Progress Notes (Signed)
Patient visit a follow up after treatments.  Mr Austin [name] reports his physical pain on a scale of 1 to 10 is a 2. He reports his anxiety level on a scale of 1-10, with 10 being all out panic, is a 3. His anxiety has subsided from a much higher level after a Catheter was inserted and he could urinate. He reported his anxiety was lower after a prayer and a blessing.  Page Bonney Roussel as  Mr Coba needs further comfort and spiritual care.  Sallee Lange. Harlyn Italiano, [place]

## 2013-07-03 NOTE — Progress Notes (Signed)
8 Days Post-Op  Subjective: Awake in bed, coughing some, but no complaints.  Objective: Vital signs in last 24 hours: Temp:  [96.3 F (35.7 C)-97.6 F (36.4 C)] 96.3 F (35.7 C) (03/17 0457) Pulse Rate:  [83-125] 125 (03/17 0457) Resp:  [16] 16 (03/17 0457) BP: (91-135)/(59-76) 135/76 mmHg (03/17 0457) SpO2:  [92 %-97 %] 92 % (03/17 0457) Last BM Date: 06/27/13  Intake/Output from previous day: 03/16 0701 - 03/17 0700 In: 378.7 [P.O.:90; I.V.:288.7] Out: 410 [Urine:410] Intake/Output this shift:    General appearance: alert, cooperative and no distress Skin: Dressing on his back is dry.  Lab Results:  No results found for this basename: WBC, HGB, HCT, PLT,  in the last 72 hours  BMET No results found for this basename: NA, K, CL, CO2, GLUCOSE, BUN, CREATININE, CALCIUM,  in the last 72 hours PT/INR No results found for this basename: LABPROT, INR,  in the last 72 hours  No results found for this basename: AST, ALT, ALKPHOS, BILITOT, PROT, ALBUMIN,  in the last 168 hours   Lipase     Component Value Date/Time   LIPASE 12 05/03/2013 1319     Studies/Results: No results found.  Medications: . digoxin  0.125 mg Intravenous Daily  . doxycycline (VIBRAMYCIN) IV  100 mg Intravenous Q12H  . fluconazole (DIFLUCAN) IV  100 mg Intravenous Q24H  . levothyroxine  37.5 mcg Intravenous Daily    Assessment/Plan 1. Bleeding mass left posterior back. This looks like metastatic tumor to me. It had a venous plexus in the mid portion and this is what was bleeding. He has at least one other superficial mass on his back between his shoulder blades that is about 4 cm in diameter, it looks like a lipoma, but is more fixed and solid.  2. NSCLC/100 pound weight loss over the last year  3. COPD/40 year history of tobacco use  4. CAD/Aflutter/Mild MR, TR, and AV stenosis  5. S/p AAA repair 05/2010  6. Hypertension  7. Hyperlipidemia  8. Hypothyroid  9. Fall with fx left hip 01/2013;  marked debility since that time  10. Hx of Polio  Plan:  Leave dressing intact for now, I will look at it tomorrow.  If it should re bleed in between, please call.   LOS: 12 days    Austin [name] 07/03/2013

## 2013-07-03 NOTE — Progress Notes (Signed)
Progress Note from the Palliative Medicine Team at [facility]:  -patient  is lethargic, speech is garbled  -sat  with patient in quiet presence   Objective: No Known Allergies Scheduled Meds: . digoxin  0.125 mg Intravenous Daily  . doxycycline (VIBRAMYCIN) IV  100 mg Intravenous Q12H  . fluconazole (DIFLUCAN) IV  100 mg Intravenous Q24H  . levothyroxine  37.5 mcg Intravenous Daily   Continuous Infusions: . sodium chloride 20 mL/hr at 07/03/13 0900   PRN Meds:.acetaminophen, albuterol, albuterol, feeding supplement (ENSURE COMPLETE), fentaNYL, guaifenesin, lidocaine, lidocaine, metoprolol, midazolam, morphine injection, ondansetron (ZOFRAN) IV, phenylephrine, silver nitrate applicators  BP 409/81  Pulse 94  Temp(Src) 96.2 F (35.7 C) (Axillary)  Resp 16  Ht 6\' 5"  (1.956 m)  Wt 111.3 kg (245 lb 6 oz)  BMI 29.09 kg/m2  SpO2 93%   PPS:20 %  Pain Score: c/o pain in legs and back "that is worse from the treatment too"     Intake/Output Summary (Last 24 hours) at 07/03/13 1446 Last data filed at 07/03/13 1000  Gross per 24 hour  Intake   1009 ml  Output    200 ml  Net    809 ml       Physical Exam:  General: chronically ill appearing, NAD,  HEENT: dry buccal membranes, no exudate noted,  Chest: CTA  CVS: RRR Skin: warm and dry  Abdomen:soft NT +BS  Ext: without edema  Neuro: lethargic   Labs: CBC    Component Value Date/Time   WBC 6.9 06/24/2013 0518   RBC 3.68* 06/24/2013 0518   HGB 11.1* 06/24/2013 0518   HCT 32.9* 06/24/2013 0518   PLT 181 06/24/2013 0518   MCV 89.4 06/24/2013 0518   MCH 30.2 06/24/2013 0518   MCHC 33.7 06/24/2013 0518   RDW 16.9* 06/24/2013 0518   LYMPHSABS 2.1 06/21/2013 1640   MONOABS 0.7 06/21/2013 1640   EOSABS 0.1 06/21/2013 1640   BASOSABS 0.0 06/21/2013 1640    BMET    Component Value Date/Time   NA 138 06/22/2013 0440   K 3.8 06/22/2013 0440   CL 99 06/22/2013 0440   CO2 27 06/22/2013 0440   GLUCOSE 97 06/22/2013 0440   BUN 13 06/22/2013  0440   CREATININE 0.67 06/22/2013 0440   CALCIUM 9.9 06/22/2013 0440   GFRNONAA 86* 06/22/2013 0440   GFRAA >90 06/22/2013 0440    CMP     Component Value Date/Time   NA 138 06/22/2013 0440   K 3.8 06/22/2013 0440   CL 99 06/22/2013 0440   CO2 27 06/22/2013 0440   GLUCOSE 97 06/22/2013 0440   BUN 13 06/22/2013 0440   CREATININE 0.67 06/22/2013 0440   CALCIUM 9.9 06/22/2013 0440   PROT 6.2 06/22/2013 0440   ALBUMIN 2.6* 06/22/2013 0440   AST 16 06/22/2013 0440   ALT 17 06/22/2013 0440   ALKPHOS 136* 06/22/2013 0440   BILITOT 0.7 06/22/2013 0440   GFRNONAA 86* 06/22/2013 0440   GFRAA >90 06/22/2013 0440    Assessment and Plan:  1. Code Status: DNR/DNI  2. Symptom Control:  PICC now inplace                     --Dysphagia: Sips as tolerated/ chew and spit/ aspiration precautions                     --Pain: Morphine 2 mg IV every 2 hrs prn                     --  Anxiety: Ativan 1 mg IV every 4 hrs prn  Once disposition is determined will make medication recommendations  3. Spiritual: Strong [employer] support,   4. Disposition: Home with hospice when medically discharged    Patient Documents Completed or Given: Document Given Completed  Advanced Directives Pkt    MOST yes   DNR    Gone from [facility]    Hard Choices yes      Wadie Lessen NP  Palliative Medicine Team Team Phone # [phone] Pager [phone]   1

## 2013-07-03 NOTE — Consult Note (Signed)
Patient seen and examined.  Agree with PA's note.  Need to leave dressing on for 48 hours.

## 2013-07-04 ENCOUNTER — Inpatient Hospital Stay (HOSPITAL_COMMUNITY): Payer: Medicare Other

## 2013-07-04 ENCOUNTER — Ambulatory Visit
Admit: 2013-07-04 | Discharge: 2013-07-04 | Disposition: A | Discharge status: Home / Self Care | Payer: Medicare Other | Attending: Radiation Oncology | Admitting: Radiation Oncology

## 2013-07-04 DIAGNOSIS — C3400 Malignant neoplasm of unspecified main bronchus: Secondary | ICD-10-CM

## 2013-07-04 MED ORDER — SODIUM CHLORIDE 0.9 % IJ SOLN
10.0000 mL | INTRAMUSCULAR | Status: DC | PRN
  Administered 2013-07-05: 10 mL

## 2013-07-04 MED ORDER — LORAZEPAM 2 MG/ML IJ SOLN: 1.0000 mg | INTRAMUSCULAR | Status: DC | PRN

## 2013-07-04 NOTE — Progress Notes (Signed)
Patient Demographics  Austin [name], is a 80 y.o. male, DOB - 03/08/1927, LNL:[id number]  Admit date - 06/21/2013   Admitting Physician [name], MD  Outpatient Primary MD for the patient is [name], MD  LOS - [phone]   Chief Complaint  Patient presents with  . Hematemesis        Assessment & Plan    80 y/o male with PMH of HTN, COPD, HPL, A fib, CVA, recent sepsis, recurrent pneumonia, c diff presented with hemoptysis found to have non-small cell lung cancer with compression to esophagus, eroding left-sided chest wall. Now full comfort care with palliative care following, undergoing palliative radiation treatments.      1. Hemoptysis/Lung mass likely lung CA (+tobacco use); non-small cell lung cancer compressing esophagus, noting left-sided chest wall with close proximity to pulmonary artery -  seen by oncology, pulmonary and radiation oncology, status post bronchoscopy on 06/25/2013, now a candidate for comfort care and palliative care. Palliative radiation treatments started on 07/02/2013, family and patient understand that these are only palliative in nature and patient is expected to pass away soon. Family being prepared to be discharged home after 3 radiation treatments, family wishing to stay here for 2 more weeks which I have told them that would be difficult.    At times coughing out lung tissue, also L, flank chest wall mass eroding through the skin, intermittent bleeds every 1-2 days. He needed cauterization of his left-sided chest wall bleed on 07/02/2013. I have told the patient and family that he can get catastrophic external or internal bleed which might be life ending.    MSSA/Candidia in Bronch washing recent H/O of Asp.PNA - doxy IV for now with  diflucan      2. Dysphagia due esophageal mass lesion: failed swallow evaluation; at risk for aspiration, recent h/p aspiration PNA, esophagogram shows Near complete obstruction of the mid esophagus with irregular tapering of the esophagus atthis level concerning for esophageal mass/ neoplasm. Per GI hold EGD; ? Need stenting (if done will need tracheal stenting as well), palliative radiation treatments in hopes of shrinking the mass in relieving some obstruction for comfort feeds.     3. Atrial fibrillation: No supportive care only. Beta blocker PRN for palpitations     4. Hypertension: Acceptable continue with metoprolol PRN for palpitations.    5. Hyperlipedemia: Statin discontinued as goal of care now comfort.    6. COPD; h/o tobacco use; no wheezing on exam; cont bronchodilators as needed      7. History of CVA holding Xarelto due to hemoptysis;      8. History of recent left hip repair  per notes: Dr. Alvan Dame on 1/28-okay for 50% weightbearing on the left lower extremity. Patient to followup with Dr. Alvan Dame in in one month for repeat x-rays, if continues to do well with good healing, patient will likely be advanced to more weightbearing status .Xray: 1/28: Healing fracture involving the distal shaft of the left femur status post sideplate and screw device reduction. He remains severely deconditioned and requiring 2 person assistance getting out of the bed. Does not want to pursue ongoing physical therapy.       Prognosis is extremely poor. Goal of care is comfort with gentle  medical treatment and palliative radiation treatments.      Code Status: DNR  Family Communication:  Updated whole family bedside 06/29/2013, 07-02-13  Disposition Plan:  TBD   Procedures  bronchoscopy 3-9, CT scan chest, barium swallow, addition treatment started 07/02/2013, PICC line placement 07/04/2013   Consults   PCCM, Pall care, Oncology, GI, CCS   Medications  Scheduled Meds: .  doxycycline (VIBRAMYCIN) IV  100 mg Intravenous Q12H  . fluconazole (DIFLUCAN) IV  100 mg Intravenous Q24H  . levothyroxine  37.5 mcg Intravenous Daily   Continuous Infusions: . sodium chloride 20 mL/hr at 07/03/13 0900   PRN Meds:.acetaminophen, albuterol, albuterol, feeding supplement (ENSURE COMPLETE), fentaNYL, guaifenesin, lidocaine, lidocaine, metoprolol, midazolam, morphine injection, ondansetron (ZOFRAN) IV, phenylephrine, silver nitrate applicators, sodium chloride  DVT Prophylaxis   SCDs   Lab Results  Component Value Date   PLT 181 06/24/2013    Antibiotics     Anti-infectives   Start     Dose/Rate Route Frequency Ordered Stop   07/03/13 0000  doxycycline (VIBRAMYCIN) 50 MG capsule     100 mg Oral 2 times daily 07/03/13 0830     07/03/13 0000  fluconazole (DIFLUCAN) 100 MG tablet     100 mg Oral Daily 07/03/13 0830     07/01/13 1600  fluconazole (DIFLUCAN) IVPB 100 mg     100 mg 50 mL/hr over 60 Minutes Intravenous Every 24 hours 07/01/13 1511     06/29/13 1045  doxycycline (VIBRAMYCIN) 100 mg in dextrose 5 % 250 mL IVPB     100 mg 125 mL/hr over 120 Minutes Intravenous Every 12 hours 06/29/13 1036     06/29/13 0800  doxycycline (VIBRA-TABS) tablet 100 mg  Status:  Discontinued     100 mg Oral Every 12 hours 06/29/13 0654 06/29/13 1036          Subjective:   Austin [name] today has, No headache, No chest pain, No abdominal pain - No Nausea, No new weakness tingling or numbness, No Cough - SOB.+ve dysphagia  Objective:   Filed Vitals:   07/03/13 0457 07/03/13 1407 07/03/13 2100 07/04/13 0625  BP: 135/76 145/80 123/78 133/76  Pulse: 125 94 96 98  Temp: 96.3 F (35.7 C) 96.2 F (35.7 C) 98.6 F (37 C) 98.2 F (36.8 C)  TempSrc: Axillary Axillary Axillary Axillary  Resp: 16 16 16 16   Height:      Weight:      SpO2: 92% 93% 94% 93%    Wt Readings from Last 3 Encounters:  06/21/13 111.3 kg (245 lb 6 oz)  06/21/13 111.3 kg (245 lb 6 oz)  05/12/13  101.606 kg (224 lb)     Intake/Output Summary (Last 24 hours) at 07/04/13 0956 Last data filed at 07/04/13 0700  Gross per 24 hour  Intake   1010 ml  Output    800 ml  Net    210 ml     Physical Exam  Awake Alert, Oriented X 3, No new F.N deficits, Normal affect [place].AT,PERRAL Supple Neck,No JVD, No cervical lymphadenopathy appriciated.  Symmetrical Chest wall movement, Good air movement bilaterally, CTAB, L, flank chest wall mass eroding through the skin. RRR,No Gallops,Rubs or new Murmurs, No Parasternal Heave +ve B.Sounds, Abd Soft, Non tender, No organomegaly appriciated, No rebound - guarding or rigidity. No Cyanosis, Clubbing or edema, No new Rash or bruise     Data Review   Micro Results Recent Results (from the past 240 hour(s))  CULTURE, BAL-QUANTITATIVE  Status: None   Collection Time    06/25/13  4:52 PM      Result Value Ref Range Status   Specimen Description BRONCHIAL ALVEOLAR LAVAGE   Final   Special Requests NONE   Final   Gram Stain     Final   Value: ABUNDANT WBC PRESENT, PREDOMINANTLY PMN     RARE SQUAMOUS EPITHELIAL CELLS PRESENT     FEW GRAM POSITIVE COCCI IN PAIRS AND CHAINS     Performed at [facility]     Final   Value: 80,000 COLONIES/ML     Performed at [facility]   Culture     Final   Value: STAPHYLOCOCCUS AUREUS     Note: RIFAMPIN AND GENTAMICIN SHOULD NOT BE USED AS SINGLE DRUGS FOR TREATMENT OF STAPH INFECTIONS.     Performed at [facility]   Report Status 06/29/2013 FINAL   Final   Organism ID, Bacteria STAPHYLOCOCCUS AUREUS   Final  AFB CULTURE WITH SMEAR     Status: None   Collection Time    06/25/13  4:52 PM      Result Value Ref Range Status   Specimen Description BRONCHIAL ALVEOLAR LAVAGE   Final   Special Requests NONE   Final   ACID FAST SMEAR     Final   Value: NO ACID FAST BACILLI SEEN     Performed at [facility]   Culture     Final   Value: CULTURE WILL BE EXAMINED  FOR 6 WEEKS BEFORE ISSUING A FINAL REPORT     Performed at [facility]   Report Status PENDING   Incomplete  FUNGUS CULTURE W SMEAR     Status: None   Collection Time    06/25/13  4:52 PM      Result Value Ref Range Status   Specimen Description BRONCHIAL ALVEOLAR LAVAGE   Final   Special Requests NONE   Final   Fungal Smear     Final   Value: NO YEAST OR FUNGAL ELEMENTS SEEN     Performed at [facility]   Culture     Final   Value: CANDIDA ALBICANS     Performed at [facility]   Report Status PENDING   Incomplete    Radiology Reports Ct Angio Chest Pe W/cm &/or Wo Cm  06/22/2013   ADDENDUM REPORT: 06/22/2013 10:45  ADDENDUM: The patient has a large left hilar mass that extends into the mediastinum into the subcarinal region. Its medial portion is inseparable from the abnormal soft tissues at the junction of the mid and distal thirds of the esophagus. It is this portion of the mass that abuts the anterior aspect of the abdominal aorta. The maximal dimensions of the subcarinal mass are approximately 6.4 cm transversely x 3.2 cm AP x 3.6 cm longitudinally.  In the left axillary region there is a borderline enlarged lymph node measuring 1.4 cm in diameter.   Electronically Signed   By: David  Martinique   On: 06/22/2013 10:45   06/22/2013   CLINICAL DATA:  Hemoptysis, left lower extremity edema, recent hip surgery  EXAM: CT ANGIOGRAPHY CHEST WITH CONTRAST  TECHNIQUE: Multidetector CT imaging of the chest was performed using the standard protocol during bolus administration of intravenous contrast. Multiplanar CT image reconstructions and MIPs were obtained to evaluate the vascular anatomy.  CONTRAST:  182mL OMNIPAQUE IOHEXOL 350 MG/ML SOLN  COMPARISON:  DG CHEST 1V PORT  dated 05/12/2013  FINDINGS: The contrast bolus is limited in terms of density. No definite intraluminal pulmonary artery defects are demonstrated. There is an abnormal mediastinal and left hilar mass. This is  producing significant mass effect upon the proximal left main pulmonary artery, but flow distally is still demonstrated. There is also mass effect upon the esophagus with distention of the proximal esophagus to the level of the carina. There is an anterior left hilar soft tissue mass 3.2 cm transversely by 4.9 cm AP. There is a 2 cm right hilar lymph node on image 48. Definite paratracheal lymphadenopathy is not demonstrated. The cardiac chambers are mildly enlarged. There is no pericardial effusion. The caliber of the thoracic aorta is normal. These soft tissue mass abuts the anterior and left lateral surface of the descending thoracic aorta.  There is a small left pleural effusion. Patchy areas of increased parenchymal density are present in the left upper lobe. Confluent increased density peripherally in the left lower lobe is present. On the right there is subsegmental atelectasis or early infiltrate in the lower lobe. There are emphysematous changes in both lungs.  Within the upper abdomen the observed portions of the liver and spleen appear normal. The adrenal glands are not included in the field of view. The thoracic vertebral bodies are preserved in height. The sternum appears intact. The observed portions of the ribs exhibit no acute abnormalities.  Review of the MIP images confirms the above findings.  IMPRESSION: 1. There is an abnormal mediastinal and left hilar mass producing mass effect upon the left main pulmonary artery. Classic pulmonary emboli are not demonstrated but vascular flow to the left lung is markedly impaired by the large mass. This mass is also resulting in esophageal obstruction with the proximal and mid portions of the esophagus distended with fluid and gas. The appearance is consistent with a central lung malignancy or malignancy of the distal esophagus. 2. There is a small left pleural effusion. 3. There is postobstructive atelectasis in the left lower lobe. Patchy areas of  interstitial density in the left upper lobe are nonspecific but could reflect parenchymal metastatic disease. There is likely atelectasis or early infiltrate in the right lower lobe. 4. These results were called by telephone at the time of interpretation on 06/21/2013 at 6:00 PM to Dr. Tanna Furry , who verbally acknowledged these results.  Electronically Signed: By: David  Martinique On: 06/21/2013 18:02   Dg Esophagus  06/23/2013   CLINICAL DATA:  Dysphagia. Blockage seen on modified barium swallow.  EXAM: ESOPHOGRAM/BARIUM SWALLOW  TECHNIQUE: Single contrast examination was performed using  thin barium.  COMPARISON:  Chest CT 06/21/2013  FLUOROSCOPY TIME:  1 min, 35 seconds  FINDINGS: Prior to the patient ingesting additional barium, fluoroscopy demonstrates retained food/debris as well as barium from a earlier modified barium swallow study. The patient was able to take several swallows of thin barium. This mixes with the foods/ debris in the obstructed proximal to mid esophagus, with irregular tapering just below the aortic arch compatible with near complete obstructing esophageal mass. A small amount of contrast passes through this area in into the distal esophagus and stomach.  IMPRESSION: Near complete obstruction of the mid esophagus with irregular tapering of the esophagus at this level concerning for esophageal mass/ neoplasm.   Electronically Signed   By: Rolm Baptise M.D.   On: 06/23/2013 17:25   Dg Swallowing Func-speech Pathology  06/23/2013   Macario Golds, CCC-SLP     06/23/2013  4:27  PM Objective Swallowing Evaluation: Modified Barium Swallowing Study   Patient Details  Name: Austin [name] MRN: [id number] Date of Birth: 07/19/1926  Today's Date: 06/23/2013 Time: 5093-2671 SLP Time Calculation (min): 52 min  Past Medical History:  Past Medical History  Diagnosis Date  . Hypertension   . Hyperlipidemia   . AAA (abdominal aortic aneurysm)   . Arthritis   . CAD (coronary artery disease)   . Thyroid  disease     hypothyroidism  . Dysrhythmia     atrial flutter  . Shortness of breath     a little with exertion  . Polio 1934  . Atrial flutter     chronic anticoagulation  . COPD (chronic obstructive pulmonary disease)   . History of nuclear stress test 04/2010    dipyridamole; small, mostly fixed basal to mid inferior and  inferoseptal defect (gut artifact v. scar); post stress EF 62%;  non-diagnostic for ischemia; low risk    Past Surgical History:  Past Surgical History  Procedure Laterality Date  . Abdominal aortic aneurysm repair  05/21/2010    aorto bi-iliac BPG  . Appendectomy  1949  . Popliteal synovial cyst excision Left   . Joint replacement      Knee and Hip replacements  . Total hip arthroplasty Left 1994  . Knee arthroplasty Left 2004  . Total hip revision Left 12/04/2012    Procedure: LEFT TOTAL HIP REVISION;  Surgeon: Mauri Pole,  MD;  Location: [facility];  Service: Orthopedics;  Laterality: Left;  . Cardiac catheterization  2009    in [place]; 50-60% RCA  . Transthoracic echocardiogram  04/2010    EF =>55%; RV borderline dilated; LA mildly dilated; mild mitral  annular calcif & mild MR; mild TR; mild calcif of AV leaflets  with mild AV stenosis; aortic root sclerosis/calcif  . Orif femur fracture Left 01/22/2013    Procedure: OPEN REDUCTION INTERNAL FIXATION (ORIF) DISTAL FEMUR  FRACTURE;  Surgeon: Mauri Pole, MD;  Location: [facility];   Service: Orthopedics;  Laterality: Left;   HPI:  80 yo male adm to [facility] with hemopytsis.  Pt found to have  subcarinal mass with esophageal obstruction proximal and mid with  gas noted, Chest imaging showed post obstructive atelectasis LLL,  ? RLL infection/ ATX.  Pt reports 43 pound weight loss since Oct  2014 and aphonia x 2 1/2 months with gradual onset and no  worsening.  Pt also with choking on food more than drink per his  report.  RN reports pt coughing with medicine.  BSE ordered.      Assessment / Plan / Recommendation Clinical Impression  Dysphagia Diagnosis: Mild  oral phase dysphagia;Moderate  pharyngeal phase dysphagia;Suspected primary esophageal  dysphagia;Moderate cervical esophageal phase dysphagia   Clinical impression: Mild oral and moderate pharyngeal-cervical  esophageal dysphagia with suspected primary esophageal deficits.   Pt has sensorimotor deficits resulting in weak muscle contraction  and pharyngeal stasis without pt awareness.  Laryngeal elevation  was very poor negatively impacting airway protection and UES  opening.   Chin tuck posture along with dry swallows decreased  amount of pharyngeal stasis of liquids.  Head turn right with  chin tuck not effective.  Laryngeal penetration (silent) of small  amount of barium noted after swallow without pt awareness-  presumed from pyriform residuals spilling into open larynx.    Pt coughed during MBS without barium visualized in larynx -  suspect possible secretion aspiration occuring based on secretion  stasis  in pharynx.    SLP only tested liquids due to appearance of stasis and narrowing  in mid-esophagus that did NOT clear with multiple swallows or  liquids. Diffiuclt situation due to multifactorial dysphagia as  pt with at risk of aspiration due to oropharyngeal weakness and  suspected esophageal component (appearance of narrowing with  retained secretions above - ? from extrinsic compression from  mass.  Please note radiologist not present during MBS to confirm  findings.      Recommend medicine be given IV and pt be allowed SMALL amounts of  tsps of thin water (? clears) with chin tuck and dry swallows for  comfort.    Rec dc intake if pt coughing or senses stasis *he did not have  sensation to poor clearance during entire test.  Pt given oral  suction and SLP demonstrated use to help clear secretions from  pharynx if able to cough.  As pt reports h/o dysphagia and  coughing up secretions x3 weeks, suspect acute on chronic  dysphagia.       Treatment Recommendation  Therapy as outlined in treatment plan below     Diet Recommendation  (? single tsps clears with chin tuck - SMALL  SINGLE TSPS ONLY)   Liquid Administration via: Spoon Medication Administration: Via alternative means Compensations: Multiple dry swallows after each bite/sip;Hard  cough after swallow Postural Changes and/or Swallow Maneuvers: Seated upright 90  degrees;Upright 30-60 min after meal;Chin tuck    Other  Recommendations Recommended Consults: MBS Oral Care Recommendations: Oral care Q4 per protocol   Follow Up Recommendations   ? Referral to address possible masses causing extrinsic  obstruction on esophagus   Frequency and Duration min 2x/week  2 weeks        General Date of Onset: 06/23/13 HPI: 80 yo male adm to [facility] with hemopytsis.  Pt found to have  subcarinal mass with esophageal obstruction proximal and mid with  gas noted, Chest imaging showed post obstructive atelectasis LLL,  ? RLL infection/ ATX.  Pt reports 43 pound weight loss since Oct  2014 and aphonia x 2 1/2 months with gradual onset and no  worsening.  Pt also with choking on food more than drink per his  report.  RN reports pt coughing with medicine.  BSE ordered.  Type of Study: Modified Barium Swallowing Study Reason for Referral: Objectively evaluate swallowing function Diet Prior to this Study: NPO Temperature Spikes Noted: No Respiratory Status: Nasal cannula History of Recent Intubation: No Behavior/Cognition: Alert;Cooperative;Pleasant mood Oral Cavity - Dentition: Missing dentition (few teeth) Oral Motor / Sensory Function: Impaired - see Bedside swallow  eval Self-Feeding Abilities: Able to feed self Patient Positioning: Upright in chair Baseline Vocal Quality: Low vocal intensity;Breathy Volitional Cough: Weak Volitional Swallow: Able to elicit Pharyngeal Secretions: Standing secretions in (comment) (pharynx)     Reason for Referral Objectively evaluate swallowing function   Oral Phase Oral Preparation/Oral Phase Oral Phase: Impaired Oral - Nectar Oral - Nectar Teaspoon:  Reduced posterior  propulsion;Lingual/palatal residue;Weak lingual manipulation Oral - Nectar Straw: Reduced posterior propulsion;Lingual/palatal  residue;Weak lingual manipulation Oral - Thin Oral - Thin Teaspoon: Reduced posterior  propulsion;Lingual/palatal residue;Weak lingual manipulation Oral - Thin Cup: Reduced posterior propulsion;Lingual/palatal  residue;Weak lingual manipulation Oral - Thin Straw: Reduced posterior propulsion;Lingual/palatal  residue;Weak lingual manipulation   Pharyngeal Phase Pharyngeal Phase Pharyngeal Phase: Impaired Pharyngeal - Nectar Pharyngeal - Nectar Teaspoon: Reduced epiglottic  inversion;Reduced pharyngeal peristalsis;Reduced anterior  laryngeal mobility;Reduced laryngeal elevation;Pharyngeal residue  - pyriform sinuses;Pharyngeal  residue - valleculae;Trace  aspiration;Pharyngeal residue - cp segment Pharyngeal - Nectar Cup: Reduced epiglottic inversion;Reduced  pharyngeal peristalsis;Reduced anterior laryngeal  mobility;Reduced laryngeal elevation;Pharyngeal residue -  valleculae;Pharyngeal residue - pyriform sinuses;Reduced tongue  base retraction;Pharyngeal residue - cp  segment;Penetration/Aspiration after swallow Penetration/Aspiration details (nectar cup): Material enters  airway, remains ABOVE vocal cords and not ejected out Pharyngeal - Thin Pharyngeal - Thin Teaspoon: Reduced epiglottic inversion;Reduced  pharyngeal peristalsis;Reduced anterior laryngeal  mobility;Reduced laryngeal elevation;Pharyngeal residue -  valleculae;Pharyngeal residue - pyriform sinuses;Reduced tongue  base retraction;Pharyngeal residue - cp segment Pharyngeal - Thin Cup: Reduced epiglottic inversion;Reduced  pharyngeal peristalsis;Reduced anterior laryngeal  mobility;Reduced laryngeal elevation;Pharyngeal residue -  valleculae;Pharyngeal residue - pyriform sinuses;Reduced tongue  base retraction;Pharyngeal residue - cp segment Pharyngeal - Thin Straw: Reduced epiglottic inversion;Reduced   pharyngeal peristalsis;Reduced anterior laryngeal  mobility;Reduced laryngeal elevation;Pharyngeal residue -  valleculae;Pharyngeal residue - pyriform sinuses;Reduced tongue  base retraction;Pharyngeal residue - cp segment Pharyngeal Phase - Comment Pharyngeal Comment: chin tuck with liquids decreases amount of  pharyngeal accumulation, cued dry swallows - pt able to cough and  "hock" up mild amount of stasis x1, does not sense pharygneal  stasis  Cervical Esophageal Phase    GO    Cervical Esophageal Phase Cervical Esophageal Phase: Impaired Cervical Esophageal Phase - Nectar Nectar Teaspoon: Reduced cricopharyngeal relaxation Nectar Cup: Reduced cricopharyngeal relaxation Cervical Esophageal Phase - Thin Thin Teaspoon: Reduced cricopharyngeal relaxation Thin Cup: Reduced cricopharyngeal relaxation Thin Straw: Reduced cricopharyngeal relaxation         Luanna Salk, MS [facility] SLP [phone]     CBC No results found for this basename: WBC, HGB, HCT, PLT, MCV, MCH, MCHC, RDW, NEUTRABS, LYMPHSABS, MONOABS, EOSABS, BASOSABS, BANDABS, BANDSABD,  in the last 168 hours  Chemistries  No results found for this basename: NA, K, CL, CO2, GLUCOSE, BUN, CREATININE, GFRCGP, CALCIUM, MG, AST, ALT, ALKPHOS, BILITOT,  in the last 168 hours ------------------------------------------------------------------------------------------------------------------ estimated creatinine clearance is 95.3 ml/min (by C-G formula based on Cr of 0.67). ------------------------------------------------------------------------------------------------------------------ No results found for this basename: HGBA1C,  in the last 72 hours ------------------------------------------------------------------------------------------------------------------ No results found for this basename: CHOL, HDL, LDLCALC, TRIG, CHOLHDL, LDLDIRECT,  in the last 72  hours ------------------------------------------------------------------------------------------------------------------ No results found for this basename: TSH, T4TOTAL, FREET3, T3FREE, THYROIDAB,  in the last 72 hours ------------------------------------------------------------------------------------------------------------------ No results found for this basename: VITAMINB12, FOLATE, FERRITIN, TIBC, IRON, RETICCTPCT,  in the last 72 hours  Coagulation profile No results found for this basename: INR, PROTIME,  in the last 168 hours  No results found for this basename: DDIMER,  in the last 72 hours  Cardiac Enzymes No results found for this basename: CK, CKMB, TROPONINI, MYOGLOBIN,  in the last 168 hours ------------------------------------------------------------------------------------------------------------------ No components found with this basename: POCBNP,      Time Spent in minutes   35   Kamaya Keckler K M.D on 07/04/2013 at 9:56 AM  Between 7am to 7pm - Pager - [phone]  After 7pm go to [email or web address] - password [id number]  And look for the night coverage person covering for me after hours  [facility] Office  [phone]

## 2013-07-04 NOTE — Progress Notes (Signed)
Patient seen and examined.  Agree with PA's note.  

## 2013-07-04 NOTE — Progress Notes (Signed)
Progress Note from the Palliative Medicine Team at [place]:  -patietn is alert and oriented, family at bedside  -continued conversation regarding diagnosis, prognosis, GOC and EOL wishes  -discussed placing PICC for continued symptom management needs, family in agreement     Objective: No Known Allergies Scheduled Meds: . doxycycline (VIBRAMYCIN) IV  100 mg Intravenous Q12H  . fluconazole (DIFLUCAN) IV  100 mg Intravenous Q24H  . levothyroxine  37.5 mcg Intravenous Daily   Continuous Infusions: . sodium chloride 20 mL/hr at 07/03/13 0900   PRN Meds:.acetaminophen, albuterol, albuterol, feeding supplement (ENSURE COMPLETE), fentaNYL, guaifenesin, lidocaine, lidocaine, LORazepam, metoprolol, morphine injection, ondansetron (ZOFRAN) IV, phenylephrine, silver nitrate applicators, sodium chloride  BP 133/76  Pulse 98  Temp(Src) 98.2 F (36.8 C) (Axillary)  Resp 16  Ht 6\' 5"  (1.956 m)  Wt 111.3 kg (245 lb 6 oz)  BMI 29.09 kg/m2  SpO2 93%   PPS:20 %  Pain Score: c/o pain in legs and back     Intake/Output Summary (Last 24 hours) at 07/04/13 1521 Last data filed at 07/04/13 0700  Gross per 24 hour  Intake    240 ml  Output    800 ml  Net   -560 ml       Physical Exam:  General: chronically ill appearing, NAD, constant cough and throat clearing (non productive)  HEENT: Mm, no exudate noted, audible throat secretions Chest: CTA  CVS: RRR  Abdomen:soft NT +BS  Ext: without edema  Neuro: alert and oriented X3  Labs: CBC    Component Value Date/Time   WBC 6.9 06/24/2013 0518   RBC 3.68* 06/24/2013 0518   HGB 11.1* 06/24/2013 0518   HCT 32.9* 06/24/2013 0518   PLT 181 06/24/2013 0518   MCV 89.4 06/24/2013 0518   MCH 30.2 06/24/2013 0518   MCHC 33.7 06/24/2013 0518   RDW 16.9* 06/24/2013 0518   LYMPHSABS 2.1 06/21/2013 1640   MONOABS 0.7 06/21/2013 1640   EOSABS 0.1 06/21/2013 1640   BASOSABS 0.0 06/21/2013 1640    BMET    Component Value Date/Time   NA 138  06/22/2013 0440   K 3.8 06/22/2013 0440   CL 99 06/22/2013 0440   CO2 27 06/22/2013 0440   GLUCOSE 97 06/22/2013 0440   BUN 13 06/22/2013 0440   CREATININE 0.67 06/22/2013 0440   CALCIUM 9.9 06/22/2013 0440   GFRNONAA 86* 06/22/2013 0440   GFRAA >90 06/22/2013 0440    CMP     Component Value Date/Time   NA 138 06/22/2013 0440   K 3.8 06/22/2013 0440   CL 99 06/22/2013 0440   CO2 27 06/22/2013 0440   GLUCOSE 97 06/22/2013 0440   BUN 13 06/22/2013 0440   CREATININE 0.67 06/22/2013 0440   CALCIUM 9.9 06/22/2013 0440   PROT 6.2 06/22/2013 0440   ALBUMIN 2.6* 06/22/2013 0440   AST 16 06/22/2013 0440   ALT 17 06/22/2013 0440   ALKPHOS 136* 06/22/2013 0440   BILITOT 0.7 06/22/2013 0440   GFRNONAA 86* 06/22/2013 0440   GFRAA >90 06/22/2013 0440    Assessment and Plan:  1. Code Status: DNR/DNI 2. Symptom Control:                     --Dysphagia: Sips as tolerated/ chew and spit/ aspiration precautions                      --Pain: Morphine 2 mg IV every 2 hrs prn                  -  Place PICC                  -place foley catheter for comfort  3. Psycho/Social: Emotional support offered to patient and family. This has been emotionally draining for this family.  All are encouraged to re-evaluate the [place] and EOL wishes for the patient  4. Spiritual: Strong [employer] support,  5. Disposition: Home with hospice when medically discharged    Patient Documents Completed or Given: Document Given Completed  Advanced Directives Pkt    MOST yes   DNR    Gone from My Sight    Hard Choices yes     Time In Time Out Total Time Spent with Patient Total Overall Time  1200 1300 60 min 60 min    Greater than 50%  of this time was spent counseling and coordinating care related to the above assessment and plan.  Wadie Lessen NP  Palliative Medicine Team Team Phone # [phone] Pager [phone]  Discussed with Dr Candiss Norse 1

## 2013-07-04 NOTE — Progress Notes (Signed)
Peripherally Inserted Central Catheter/Midline Placement  The IV Nurse has discussed with the patient and/or persons authorized to consent for the patient, the purpose of this procedure and the potential benefits and risks involved with this procedure.  The benefits include less needle sticks, lab draws from the catheter and patient may be discharged home with the catheter.  Risks include, but not limited to, infection, bleeding, blood clot (thrombus formation), and puncture of an artery; nerve damage and irregular heat beat.  Alternatives to this procedure were also discussed.  PICC/Midline Placement Documentation     Phone consent from daughter   Murvin Natal 07/04/2013, 9:28 AM

## 2013-07-04 NOTE — Progress Notes (Signed)
07/04/13 1500  Clinical Encounter Type  Visited With Family;Patient not available (pt sleeping; met w/ daughter, granddaughter, son-in-law (?) )  Visit Type Spiritual support;Social support  Spiritual Encounters  Spiritual Needs Emotional   Visited with Mr Garfinkle's family in the hallway as he was sleeping.  Provided pastoral check-in and assistance with reflection as family processed pt's unfolding care/condition.  Family is starting to think about pt's favorite Scripture and hymns as part of storytelling, meaning-making, reflection, and preparation.  Offered support in thinking/talking about and planning celebration of pt's life as family desires. Family has NOT discussed this with Mr Tritz yet, but is working to process and prepare for EOL needs. Family very appreciative of chaplain support.  Please page as needs arise:  [phone].  Thank you.  [place], [place]

## 2013-07-04 NOTE — Progress Notes (Signed)
[facility] Therapy Treatment Record Phone [phone]   Radiation Therapy was administered to Austin [name] on: 07/04/2013  11:11 AM and was treatment # 3 out of a planned course of 10 treatments.

## 2013-07-04 NOTE — Progress Notes (Signed)
9 Days Post-Op  Subjective: He is very sleepy, he has had a PICC line and radiation therapy so far today.  The nursing staff had changed the site earlier today, and it has a dressing in place.  There is some serous with a little blood visible thru tegaderm, but nothing to be concerned with.  Objective: Vital signs in last 24 hours: Temp:  [96.2 F (35.7 C)-98.6 F (37 C)] 98.2 F (36.8 C) (03/18 0625) Pulse Rate:  [94-98] 98 (03/18 0625) Resp:  [16] 16 (03/18 0625) BP: (123-145)/(76-80) 133/76 mmHg (03/18 0625) SpO2:  [93 %-94 %] 93 % (03/18 0625) Last BM Date: 06/27/13 60 ml recorded. 800 ml urine output recorded. No BM DIII diet Afebrile, VSS No labs Intake/Output from previous day: 03/17 0701 - 03/18 0700 In: 1040 [P.O.:60; I.V.:180; IV Piggyback:800] Out: 800 [Urine:800] Intake/Output this shift:    General appearance: Pt is very tired and sleeping, hard to arouse in the room.  Once he wakes up he appears to be gasping in pain, but source is uncertain. Skin: Dressing has been replaced and tegaderm dressing in place.  He has some drainage on it , but not enough to warrant taking dressing down.  I will change it with the nursing staff later today, or in AM.  Lab Results:  No results found for this basename: WBC, HGB, HCT, PLT,  in the last 72 hours  BMET No results found for this basename: NA, K, CL, CO2, GLUCOSE, BUN, CREATININE, CALCIUM,  in the last 72 hours PT/INR No results found for this basename: LABPROT, INR,  in the last 72 hours  No results found for this basename: AST, ALT, ALKPHOS, BILITOT, PROT, ALBUMIN,  in the last 168 hours   Lipase     Component Value Date/Time   LIPASE 12 05/03/2013 1319     Studies/Results: Dg Chest Port 1 View  07/04/2013   CLINICAL DATA:  Status post central line placement.  EXAM: PORTABLE CHEST - 1 VIEW  COMPARISON:  CT ANGIO CHEST W/CM &/OR WO/CM dated 06/21/2013; DG CHEST 1V PORT dated 05/12/2013  FINDINGS: Patient has undergone  placement of a PICC line on the right. The tip of the catheter lies in the region of the proximal SVC.  The lungs are reasonably well inflated. The interstitial markings remain increased. The cardiopericardial silhouette is normal in size. The pulmonary vascularity is not engorged.  IMPRESSION: 1. There is no evidence of a postprocedure complication following placement of the PICC line on the right. 2. There remain increased interstitial densities within both lungs which may reflect fibrosis.   Electronically Signed   By: David  Martinique   On: 07/04/2013 10:13    Medications: . doxycycline (VIBRAMYCIN) IV  100 mg Intravenous Q12H  . fluconazole (DIFLUCAN) IV  100 mg Intravenous Q24H  . levothyroxine  37.5 mcg Intravenous Daily    Assessment/Plan 1. Bleeding mass left posterior back. This looks like metastatic tumor to me. It had a venous plexus in the mid portion and this is what was bleeding. He has at least one other superficial mass on his back between his shoulder blades that is about 4 cm in diameter, it looks like a lipoma, but is more fixed and solid.  2. NSCLC/100 pound weight loss over the last year  3. COPD/40 year history of tobacco use  4. CAD/Aflutter/Mild MR, TR, and AV stenosis  5. S/p AAA repair 05/2010  6. Hypertension  7. Hyperlipidemia  8. Hypothyroid  9. Fall with  fx left hip 01/2013; marked debility since that time  10. Hx of Polio   Plan:  He is worn out, his family is tense and very anxious.  The dressing was changed this AM.  It looks like mainly serous draiange.  I will leave for next dressing  Change.  The nursing staff will call me.  LOS: 13 days    Kellyn Mccary 07/04/2013

## 2013-07-05 ENCOUNTER — Ambulatory Visit: Payer: Medicare Other

## 2013-07-05 ENCOUNTER — Telehealth: Payer: Self-pay | Admitting: *Deleted

## 2013-07-05 MED ORDER — MORPHINE SULFATE (CONCENTRATE) 10 MG /0.5 ML PO SOLN: 10.0000 mg | ORAL | Status: AC | PRN

## 2013-07-05 MED ORDER — HEPARIN SOD (PORK) LOCK FLUSH 100 UNIT/ML IV SOLN
250.0000 [IU] | INTRAVENOUS | Status: DC | PRN
  Administered 2013-07-05: 250 [IU]
  Filled 2013-07-05: qty 3

## 2013-07-05 MED ORDER — MORPHINE SULFATE (CONCENTRATE) 10 MG /0.5 ML PO SOLN: 10.0000 mg | ORAL | Status: DC | PRN

## 2013-07-05 MED ORDER — MORPHINE SULFATE (CONCENTRATE) 10 MG /0.5 ML PO SOLN
10.0000 mg | Freq: Four times a day (QID) | ORAL | Status: DC
  Administered 2013-07-05: 10 mg via ORAL
  Filled 2013-07-05: qty 0.5

## 2013-07-05 MED ORDER — HEPARIN SOD (PORK) LOCK FLUSH 100 UNIT/ML IV SOLN
250.0000 [IU] | Freq: Every day | INTRAVENOUS | Status: DC
  Filled 2013-07-05: qty 3

## 2013-07-05 MED ORDER — LORAZEPAM 1 MG PO TABS: 1.0000 mg | ORAL_TABLET | ORAL | Status: DC | PRN

## 2013-07-05 MED ORDER — LORAZEPAM 2 MG/ML PO CONC: 2.0000 mg | Freq: Four times a day (QID) | ORAL | Status: AC | PRN

## 2013-07-05 MED ORDER — MORPHINE SULFATE (CONCENTRATE) 10 MG /0.5 ML PO SOLN: 10.0000 mg | Freq: Four times a day (QID) | ORAL | Status: AC

## 2013-07-05 NOTE — Telephone Encounter (Signed)
Called 1614 floor nurse KIM<RN, after speaking with MD, to let her know to tell family/patient Dr.Moody would like tyo see them after his office hours today if patient is still in the hospital, he was informed patient and family has decided no further radiation treatments, per RN, patient may be d/c before 5pm today, but will call again, later today to see if patient is still in the hospital,thanked Kim, notified linac 3 that todays treatment is cancelled per family and Dr.Moody 10:17 AM

## 2013-07-05 NOTE — Discharge Instructions (Signed)
Follow with Primary MD HAMRICK,MAURA L, MD as desired   Activity: As tolerated with Full fall precautions use walker/cane & assistance as needed   Disposition Home with Hospice   Diet: Soft diet feeding assistance and aspiration precautions if needed.  For Heart failure patients - Check your Weight same time everyday, if you gain over 2 pounds, or you develop in leg swelling, experience more shortness of breath or chest pain, call your Primary MD immediately. Follow Cardiac Low Salt Diet and 1.8 lit/day fluid restriction.   On your next visit with her primary care physician please Get Medicines reviewed and adjusted.  Please request your Prim.MD to go over all Hospital Tests and Procedure/Radiological results at the follow up, please get all Hospital records sent to your Prim MD by signing hospital release before you go home.   If you experience worsening of your admission symptoms, develop shortness of breath, life threatening emergency, suicidal or homicidal thoughts you must seek medical attention immediately by calling 911 or calling your MD immediately  if symptoms less severe.  You Must read complete instructions/literature along with all the possible adverse reactions/side effects for all the Medicines you take and that have been prescribed to you. Take any new Medicines after you have completely understood and accpet all the possible adverse reactions/side effects.   Do not drive and provide baby sitting services if your were admitted for syncope or siezures until you have seen by Primary MD or a Neurologist and advised to do so again.  Do not drive when taking Pain medications.    Do not take more than prescribed Pain, Sleep and Anxiety Medications  Special Instructions: If you have smoked or chewed Tobacco  in the last 2 yrs please stop smoking, stop any regular Alcohol  and or any Recreational drug use.  Wear Seat belts while driving.   Please note  You were cared for  by a hospitalist during your hospital stay. If you have any questions about your discharge medications or the care you received while you were in the hospital after you are discharged, you can call the unit and asked to speak with the hospitalist on call if the hospitalist that took care of you is not available. Once you are discharged, your primary care physician will handle any further medical issues. Please note that NO REFILLS for any discharge medications will be authorized once you are discharged, as it is imperative that you return to your primary care physician (or establish a relationship with a primary care physician if you do not have one) for your aftercare needs so that they can reassess your need for medications and monitor your lab values.

## 2013-07-05 NOTE — Progress Notes (Signed)
Progress Note from the Palliative Medicine Team at [facility]:  -patient  is lethargic, speech is garbled  -spoke with son Xavien, plan is to dc home today with hospice services   -no more radiation   -no more IV medications other than for comfort once dc   -comfort is the priority   -leave PICC and foley     Objective: No Known Allergies Scheduled Meds: . doxycycline (VIBRAMYCIN) IV  100 mg Intravenous Q12H  . morphine CONCENTRATE  10 mg Oral Q6H   Continuous Infusions: . sodium chloride 20 mL/hr at 07/04/13 1700   PRN Meds:.acetaminophen, albuterol, feeding supplement (ENSURE COMPLETE), guaifenesin, lidocaine, lidocaine, LORazepam, morphine CONCENTRATE, ondansetron (ZOFRAN) IV, phenylephrine, silver nitrate applicators, sodium chloride  BP 132/90  Pulse 100  Temp(Src) 97.8 F (36.6 C) (Axillary)  Resp 16  Ht 6\' 5"  (1.956 m)  Wt 111.3 kg (245 lb 6 oz)  BMI 29.09 kg/m2  SpO2 98%   PPS:20 %  Pain Score: c/o pain in legs and back "that is worse from the treatment too"     Intake/Output Summary (Last 24 hours) at 07/05/13 0927 Last data filed at 07/05/13 0600  Gross per 24 hour  Intake   1195 ml  Output    525 ml  Net    670 ml       Physical Exam:  General: chronically ill appearing, NAD,  HEENT: dry buccal membranes, no exudate noted,  Chest: CTA  CVS: RRR Skin: warm and dry  Abdomen:soft NT +BS  Ext: without edema  Neuro: lethargic   Labs: CBC    Component Value Date/Time   WBC 6.9 06/24/2013 0518   RBC 3.68* 06/24/2013 0518   HGB 11.1* 06/24/2013 0518   HCT 32.9* 06/24/2013 0518   PLT 181 06/24/2013 0518   MCV 89.4 06/24/2013 0518   MCH 30.2 06/24/2013 0518   MCHC 33.7 06/24/2013 0518   RDW 16.9* 06/24/2013 0518   LYMPHSABS 2.1 06/21/2013 1640   MONOABS 0.7 06/21/2013 1640   EOSABS 0.1 06/21/2013 1640   BASOSABS 0.0 06/21/2013 1640    BMET    Component Value Date/Time   NA 138 06/22/2013 0440   K 3.8 06/22/2013 0440   CL 99 06/22/2013 0440   CO2 27  06/22/2013 0440   GLUCOSE 97 06/22/2013 0440   BUN 13 06/22/2013 0440   CREATININE 0.67 06/22/2013 0440   CALCIUM 9.9 06/22/2013 0440   GFRNONAA 86* 06/22/2013 0440   GFRAA >90 06/22/2013 0440    CMP     Component Value Date/Time   NA 138 06/22/2013 0440   K 3.8 06/22/2013 0440   CL 99 06/22/2013 0440   CO2 27 06/22/2013 0440   GLUCOSE 97 06/22/2013 0440   BUN 13 06/22/2013 0440   CREATININE 0.67 06/22/2013 0440   CALCIUM 9.9 06/22/2013 0440   PROT 6.2 06/22/2013 0440   ALBUMIN 2.6* 06/22/2013 0440   AST 16 06/22/2013 0440   ALT 17 06/22/2013 0440   ALKPHOS 136* 06/22/2013 0440   BILITOT 0.7 06/22/2013 0440   GFRNONAA 86* 06/22/2013 0440   GFRAA >90 06/22/2013 0440    Assessment and Plan:  1. Code Status: DNR/DNI  2. Symptom Control:  PICC now inplace                     --Dysphagia: Sips as tolerated/ chew and spit/ aspiration precautions                      --  Pain/Dyspnea: patietn has not utilized IV medications for 24 hrs will write for oral medications  3. Spiritual: Strong [employer] support,   4. Disposition: Home with hospice when medically discharged    Patient Documents Completed or Given: Document Given Completed  Advanced Directives Pkt    MOST yes   DNR    Gone from My Sight    Hard Choices yes      Wadie Lessen NP  Palliative Medicine Team Team Phone # [phone] Pager [phone]  Discussed with Dr Candiss Norse 1

## 2013-07-05 NOTE — Progress Notes (Signed)
Patient seen.  Agree with PA's note.

## 2013-07-05 NOTE — Discharge Summary (Addendum)
Austin [name], is a 80 y.o. male  DOB [birth year]  MRN [id number].  Admission date:  06/21/2013  Admitting Physician  [name], MD  Discharge Date:  07/05/2013   Primary MD  [name], MD  Recommendations for primary care physician for things to follow:   Discharged with home hospice goal comfort   Admission Diagnosis  Atrial fibrillation [427.31] Coagulopathy [286.9] Hyperlipemia [272.4] Lung mass [786.6] Chest mass [786.6] Soft tissue mass [729.90] Chronic atrial flutter [427.32] Hemoptysis [786.30] Dysphagia [787.20]   Discharge Diagnosis  Atrial fibrillation [427.31] Coagulopathy [286.9] Hyperlipemia [272.4] Lung mass [786.6] Chest mass [786.6] Soft tissue mass [729.90] Chronic atrial flutter [427.32] Hemoptysis [786.30] Dysphagia [787.20]  Non-small cell lung cancer with compression of the esophagus, eroding through left chest wall  Active Problems:   Hemoptysis   Chest mass   Dysphagia   Atrial fibrillation   Hyperlipemia   Hypertension   Malnutrition of moderate degree   Palliative care encounter   Dysphagia, unspecified(787.20)   Pain, generalized   Weakness generalized      Past Medical History  Diagnosis Date  . Hypertension   . Hyperlipidemia   . AAA (abdominal aortic aneurysm)   . Arthritis   . CAD (coronary artery disease)   . Thyroid disease     hypothyroidism  . Dysrhythmia     atrial flutter  . Shortness of breath     a little with exertion  . Polio 1934  . Atrial flutter     chronic anticoagulation  . COPD (chronic obstructive pulmonary disease)   . History of nuclear stress test 04/2010    dipyridamole; small, mostly fixed basal to mid inferior and inferoseptal defect (gut artifact v. scar); post stress EF 62%; non-diagnostic for ischemia; low risk     Past  Surgical History  Procedure Laterality Date  . Abdominal aortic aneurysm repair  05/21/2010    aorto bi-iliac BPG  . Appendectomy  1949  . Popliteal synovial cyst excision Left   . Joint replacement      Knee and Hip replacements  . Total hip arthroplasty Left 1994  . Knee arthroplasty Left 2004  . Total hip revision Left 12/04/2012    Procedure: LEFT TOTAL HIP REVISION;  Surgeon: Mauri Pole, MD;  Location: [facility];  Service: Orthopedics;  Laterality: Left;  . Cardiac catheterization  2009    in [place]; 50-60% RCA  . Transthoracic echocardiogram  04/2010    EF =>55%; RV borderline dilated; LA mildly dilated; mild mitral annular calcif & mild MR; mild TR; mild calcif of AV leaflets with mild AV stenosis; aortic root sclerosis/calcif  . Orif femur fracture Left 01/22/2013    Procedure: OPEN REDUCTION INTERNAL FIXATION (ORIF) DISTAL FEMUR FRACTURE;  Surgeon: Mauri Pole, MD;  Location: [facility];  Service: Orthopedics;  Laterality: Left;  . Video bronchoscopy Bilateral 06/25/2013    Procedure: VIDEO BRONCHOSCOPY WITHOUT FLUORO;  Surgeon: Rush Farmer, MD;  Location: [facility];  Service: Cardiopulmonary;  Laterality: Bilateral;  Discharge Condition: guarded   Follow UP  Follow-up Information   Follow up with Hospice at [facility] ([facility] to follow aftr d/c call when pt ready to leave [phone] (Or [phone] aftr 5 pm))    Specialty:  Hospice and Palliative Medicine   Contact information:   [place] [address] [phone]         Discharge Instructions  and  Discharge Medications          Discharge Orders   Future Appointments Provider Department Dept Phone   07/05/2013 2:00 PM [facility] [phone]   07/06/2013 1:45 PM [facility] [phone]   07/06/2013 2:00 PM Marye Round, [place] [facility] [phone]   07/09/2013 2:30 PM [facility] [phone]   07/10/2013 2:30 PM [facility] [phone]   07/11/2013 2:30 PM [facility] [phone]   07-24-2013 2:30 PM [facility] [phone]   07/13/2013 2:30 PM [place] [phone]   10/28/2014 8:30 AM [facility] [place] [phone]   Eat a light meal the night before the exam but please avoid gaseous foods.   Nothing to eat or drink for at least 8 hours prior to the exam. No gum chewing or smoking the morning of the exam. Please take your morning medications with small sips of water, especially blood pressure medication. If you have several vascular lab exams and will see physician, please bring a snack with you.   10/28/2014 9:00 AM Serafina Mitchell, MD [facility] [phone]   Future Orders Complete By Expires   Diet - low sodium heart healthy  As directed    Discharge instructions  As directed    Comments:     Follow with Primary MD HAMRICK,MAURA L, MD as desired   Activity: As tolerated with Full fall precautions use walker/cane & assistance as needed   Disposition Home with Hospice   Diet: Soft diet feeding assistance and aspiration precautions if needed.  For Heart failure patients - Check your Weight same time everyday, if you gain over 2 pounds, or you develop in leg swelling, experience more shortness of breath or chest pain, call your Primary MD immediately. Follow Cardiac Low Salt Diet and 1.8 lit/day fluid restriction.   On your next visit with her primary care physician please Get Medicines reviewed and adjusted.  Please request your Prim.MD to go over all Hospital  Tests and Procedure/Radiological results at the follow up, please get all Hospital records sent to your Prim MD by signing hospital release before you go home.   If you experience worsening of your admission symptoms, develop shortness of breath, life threatening emergency, suicidal or homicidal thoughts you must seek medical attention immediately by calling 911 or calling your MD immediately  if symptoms less severe.  You Must read complete instructions/literature along with all the possible adverse reactions/side effects for all the Medicines you take and that have been prescribed to you. Take any new Medicines after you have completely understood and accpet all the possible adverse reactions/side effects.   Do not drive and provide baby sitting services if your were admitted for syncope or siezures until  you have seen by Primary MD or a Neurologist and advised to do so again.  Do not drive when taking Pain medications.    Do not take more than prescribed Pain, Sleep and Anxiety Medications  Special Instructions: If you have smoked or chewed Tobacco  in the last 2 yrs please stop smoking, stop any regular Alcohol  and or any Recreational drug use.  Wear Seat belts while driving.   Please note  You were cared for by a hospitalist during your hospital stay. If you have any questions about your discharge medications or the care you received while you were in the hospital after you are discharged, you can call the unit and asked to speak with the hospitalist on call if the hospitalist that took care of you is not available. Once you are discharged, your primary care physician will handle any further medical issues. Please note that NO REFILLS for any discharge medications will be authorized once you are discharged, as it is imperative that you return to your primary care physician (or establish a relationship with a primary care physician if you do not have one) for your aftercare needs so that  they can reassess your need for medications and monitor your lab values.   Increase activity slowly  As directed        Medication List    STOP taking these medications       cholecalciferol 1000 UNITS tablet  Commonly known as:  VITAMIN D     ferrous sulfate 325 (65 FE) MG tablet     levothyroxine 75 MCG tablet  Commonly known as:  SYNTHROID, LEVOTHROID     lovastatin 40 MG tablet  Commonly known as:  MEVACOR     multivitamin with minerals Tabs tablet     naproxen sodium 220 MG tablet  Commonly known as:  ANAPROX     XARELTO 20 MG Tabs tablet  Generic drug:  Rivaroxaban      TAKE these medications       albuterol (2.5 MG/3ML) 0.083% nebulizer solution  Commonly known as:  PROVENTIL  Take 3 mLs (2.5 mg total) by nebulization every 6 (six) hours as needed for wheezing or shortness of breath.     digoxin 0.125 MG tablet  Commonly known as:  LANOXIN  Take 1 tablet (0.125 mg total) by mouth daily.     doxycycline 50 MG capsule  Commonly known as:  VIBRAMYCIN  Take 2 capsules (100 mg total) by mouth 2 (two) times daily.     fluconazole 100 MG tablet  Commonly known as:  DIFLUCAN  Take 1 tablet (100 mg total) by mouth daily.     LORazepam 1 MG tablet  Commonly known as:  ATIVAN  Take 1 tablet (1 mg total) by mouth every 6 (six) hours as needed for anxiety.     LORazepam 2 MG/ML concentrated solution  Commonly known as:  ATIVAN  Take 1 mL (2 mg total) by mouth every 6 (six) hours as needed for anxiety.     metoprolol tartrate 25 MG tablet  Commonly known as:  LOPRESSOR  Take 1 tablet (25 mg total) by mouth 2 (two) times daily.     morphine CONCENTRATE 10 mg / 0.5 ml concentrated solution  Take 0.5 mLs (10 mg total) by mouth every hour as needed for moderate pain or shortness of breath.     morphine CONCENTRATE 10 mg / 0.5 ml concentrated solution  Take 0.5 mLs (10 mg total) by  mouth every 6 (six) hours.          Diet and Activity recommendation: See  Discharge Instructions above   Consults obtained - PCCM, Oncology, Rad-Onco, Pall care, CCS   Major procedures and Radiology Reports - PLEASE review detailed and final reports for all details, in brief -       Ct Angio Chest Pe W/cm &/or Wo Cm  06/22/2013   ADDENDUM REPORT: 06/22/2013 10:45  ADDENDUM: The patient has a large left hilar mass that extends into the mediastinum into the subcarinal region. Its medial portion is inseparable from the abnormal soft tissues at the junction of the mid and distal thirds of the esophagus. It is this portion of the mass that abuts the anterior aspect of the abdominal aorta. The maximal dimensions of the subcarinal mass are approximately 6.4 cm transversely x 3.2 cm AP x 3.6 cm longitudinally.  In the left axillary region there is a borderline enlarged lymph node measuring 1.4 cm in diameter.   Electronically Signed   By: David  Martinique   On: 06/22/2013 10:45   06/22/2013   CLINICAL DATA:  Hemoptysis, left lower extremity edema, recent hip surgery  EXAM: CT ANGIOGRAPHY CHEST WITH CONTRAST  TECHNIQUE: Multidetector CT imaging of the chest was performed using the standard protocol during bolus administration of intravenous contrast. Multiplanar CT image reconstructions and MIPs were obtained to evaluate the vascular anatomy.  CONTRAST:  129mL OMNIPAQUE IOHEXOL 350 MG/ML SOLN  COMPARISON:  DG CHEST 1V PORT dated 05/12/2013  FINDINGS: The contrast bolus is limited in terms of density. No definite intraluminal pulmonary artery defects are demonstrated. There is an abnormal mediastinal and left hilar mass. This is producing significant mass effect upon the proximal left main pulmonary artery, but flow distally is still demonstrated. There is also mass effect upon the esophagus with distention of the proximal esophagus to the level of the carina. There is an anterior left hilar soft tissue mass 3.2 cm transversely by 4.9 cm AP. There is a 2 cm right hilar lymph node on image  48. Definite paratracheal lymphadenopathy is not demonstrated. The cardiac chambers are mildly enlarged. There is no pericardial effusion. The caliber of the thoracic aorta is normal. These soft tissue mass abuts the anterior and left lateral surface of the descending thoracic aorta.  There is a small left pleural effusion. Patchy areas of increased parenchymal density are present in the left upper lobe. Confluent increased density peripherally in the left lower lobe is present. On the right there is subsegmental atelectasis or early infiltrate in the lower lobe. There are emphysematous changes in both lungs.  Within the upper abdomen the observed portions of the liver and spleen appear normal. The adrenal glands are not included in the field of view. The thoracic vertebral bodies are preserved in height. The sternum appears intact. The observed portions of the ribs exhibit no acute abnormalities.  Review of the MIP images confirms the above findings.  IMPRESSION: 1. There is an abnormal mediastinal and left hilar mass producing mass effect upon the left main pulmonary artery. Classic pulmonary emboli are not demonstrated but vascular flow to the left lung is markedly impaired by the large mass. This mass is also resulting in esophageal obstruction with the proximal and mid portions of the esophagus distended with fluid and gas. The appearance is consistent with a central lung malignancy or malignancy of the distal esophagus. 2. There is a small left pleural effusion. 3. There is postobstructive atelectasis in  the left lower lobe. Patchy areas of interstitial density in the left upper lobe are nonspecific but could reflect parenchymal metastatic disease. There is likely atelectasis or early infiltrate in the right lower lobe. 4. These results were called by telephone at the time of interpretation on 06/21/2013 at 6:00 PM to Dr. Tanna Furry , who verbally acknowledged these results.  Electronically Signed: By: David   Martinique On: 06/21/2013 18:02   Dg Esophagus  06/23/2013   CLINICAL DATA:  Dysphagia. Blockage seen on modified barium swallow.  EXAM: ESOPHOGRAM/BARIUM SWALLOW  TECHNIQUE: Single contrast examination was performed using  thin barium.  COMPARISON:  Chest CT 06/21/2013  FLUOROSCOPY TIME:  1 min, 35 seconds  FINDINGS: Prior to the patient ingesting additional barium, fluoroscopy demonstrates retained food/debris as well as barium from a earlier modified barium swallow study. The patient was able to take several swallows of thin barium. This mixes with the foods/ debris in the obstructed proximal to mid esophagus, with irregular tapering just below the aortic arch compatible with near complete obstructing esophageal mass. A small amount of contrast passes through this area in into the distal esophagus and stomach.  IMPRESSION: Near complete obstruction of the mid esophagus with irregular tapering of the esophagus at this level concerning for esophageal mass/ neoplasm.   Electronically Signed   By: Rolm Baptise M.D.   On: 06/23/2013 17:25   Dg Chest Port 1 View  07/04/2013   CLINICAL DATA:  Status post central line placement.  EXAM: PORTABLE CHEST - 1 VIEW  COMPARISON:  CT ANGIO CHEST W/CM &/OR WO/CM dated 06/21/2013; DG CHEST 1V PORT dated 05/12/2013  FINDINGS: Patient has undergone placement of a PICC line on the right. The tip of the catheter lies in the region of the proximal SVC.  The lungs are reasonably well inflated. The interstitial markings remain increased. The cardiopericardial silhouette is normal in size. The pulmonary vascularity is not engorged.  IMPRESSION: 1. There is no evidence of a postprocedure complication following placement of the PICC line on the right. 2. There remain increased interstitial densities within both lungs which may reflect fibrosis.   Electronically Signed   By: David  Martinique   On: 07/04/2013 10:13   Dg Swallowing Func-speech Pathology  06/23/2013   Macario Golds, CCC-SLP      06/23/2013  4:27 PM Objective Swallowing Evaluation: Modified Barium Swallowing Study   Patient Details  Name: HANCE CASPERS MRN: [id number] Date of Birth: 03/04/1927  Today's Date: 06/23/2013 Time: 1884-1660 SLP Time Calculation (min): 52 min  Past Medical History:  Past Medical History  Diagnosis Date  . Hypertension   . Hyperlipidemia   . AAA (abdominal aortic aneurysm)   . Arthritis   . CAD (coronary artery disease)   . Thyroid disease     hypothyroidism  . Dysrhythmia     atrial flutter  . Shortness of breath     a little with exertion  . Polio 1934  . Atrial flutter     chronic anticoagulation  . COPD (chronic obstructive pulmonary disease)   . History of nuclear stress test 04/2010    dipyridamole; small, mostly fixed basal to mid inferior and  inferoseptal defect (gut artifact v. scar); post stress EF 62%;  non-diagnostic for ischemia; low risk    Past Surgical History:  Past Surgical History  Procedure Laterality Date  . Abdominal aortic aneurysm repair  05/21/2010    aorto bi-iliac BPG  . Appendectomy  1949  . Popliteal synovial  cyst excision Left   . Joint replacement      Knee and Hip replacements  . Total hip arthroplasty Left 1994  . Knee arthroplasty Left 2004  . Total hip revision Left 12/04/2012    Procedure: LEFT TOTAL HIP REVISION;  Surgeon: Mauri Pole,  MD;  Location: [facility];  Service: Orthopedics;  Laterality: Left;  . Cardiac catheterization  2009    in [place]; 50-60% RCA  . Transthoracic echocardiogram  04/2010    EF =>55%; RV borderline dilated; LA mildly dilated; mild mitral  annular calcif & mild MR; mild TR; mild calcif of AV leaflets  with mild AV stenosis; aortic root sclerosis/calcif  . Orif femur fracture Left 01/22/2013    Procedure: OPEN REDUCTION INTERNAL FIXATION (ORIF) DISTAL FEMUR  FRACTURE;  Surgeon: Mauri Pole, MD;  Location: [facility];   Service: Orthopedics;  Laterality: Left;   HPI:  80 yo male adm to [facility] with hemopytsis.  Pt found to have  subcarinal mass with esophageal  obstruction proximal and mid with  gas noted, Chest imaging showed post obstructive atelectasis LLL,  ? RLL infection/ ATX.  Pt reports 43 pound weight loss since Oct  2014 and aphonia x 2 1/2 months with gradual onset and no  worsening.  Pt also with choking on food more than drink per his  report.  RN reports pt coughing with medicine.  BSE ordered.      Assessment / Plan / Recommendation Clinical Impression  Dysphagia Diagnosis: Mild oral phase dysphagia;Moderate  pharyngeal phase dysphagia;Suspected primary esophageal  dysphagia;Moderate cervical esophageal phase dysphagia   Clinical impression: Mild oral and moderate pharyngeal-cervical  esophageal dysphagia with suspected primary esophageal deficits.   Pt has sensorimotor deficits resulting in weak muscle contraction  and pharyngeal stasis without pt awareness.  Laryngeal elevation  was very poor negatively impacting airway protection and UES  opening.   Chin tuck posture along with dry swallows decreased  amount of pharyngeal stasis of liquids.  Head turn right with  chin tuck not effective.  Laryngeal penetration (silent) of small  amount of barium noted after swallow without pt awareness-  presumed from pyriform residuals spilling into open larynx.    Pt coughed during MBS without barium visualized in larynx -  suspect possible secretion aspiration occuring based on secretion  stasis in pharynx.    SLP only tested liquids due to appearance of stasis and narrowing  in mid-esophagus that did NOT clear with multiple swallows or  liquids. Diffiuclt situation due to multifactorial dysphagia as  pt with at risk of aspiration due to oropharyngeal weakness and  suspected esophageal component (appearance of narrowing with  retained secretions above - ? from extrinsic compression from  mass.  Please note radiologist not present during MBS to confirm  findings.      Recommend medicine be given IV and pt be allowed SMALL amounts of  tsps of thin water (? clears) with  chin tuck and dry swallows for  comfort.    Rec dc intake if pt coughing or senses stasis *he did not have  sensation to poor clearance during entire test.  Pt given oral  suction and SLP demonstrated use to help clear secretions from  pharynx if able to cough.  As pt reports h/o dysphagia and  coughing up secretions x3 weeks, suspect acute on chronic  dysphagia.       Treatment Recommendation  Therapy as outlined in treatment plan below    Diet Recommendation  (?  single tsps clears with chin tuck - SMALL  SINGLE TSPS ONLY)   Liquid Administration via: Spoon Medication Administration: Via alternative means Compensations: Multiple dry swallows after each bite/sip;Hard  cough after swallow Postural Changes and/or Swallow Maneuvers: Seated upright 90  degrees;Upright 30-60 min after meal;Chin tuck    Other  Recommendations Recommended Consults: MBS Oral Care Recommendations: Oral care Q4 per protocol   Follow Up Recommendations   ? Referral to address possible masses causing extrinsic  obstruction on esophagus   Frequency and Duration min 2x/week  2 weeks        General Date of Onset: 06/23/13 HPI: 80 yo male adm to [facility] with hemopytsis.  Pt found to have  subcarinal mass with esophageal obstruction proximal and mid with  gas noted, Chest imaging showed post obstructive atelectasis LLL,  ? RLL infection/ ATX.  Pt reports 43 pound weight loss since Oct  2014 and aphonia x 2 1/2 months with gradual onset and no  worsening.  Pt also with choking on food more than drink per his  report.  RN reports pt coughing with medicine.  BSE ordered.  Type of Study: Modified Barium Swallowing Study Reason for Referral: Objectively evaluate swallowing function Diet Prior to this Study: NPO Temperature Spikes Noted: No Respiratory Status: Nasal cannula History of Recent Intubation: No Behavior/Cognition: Alert;Cooperative;Pleasant mood Oral Cavity - Dentition: Missing dentition (few teeth) Oral Motor / Sensory Function: Impaired - see  Bedside swallow  eval Self-Feeding Abilities: Able to feed self Patient Positioning: Upright in chair Baseline Vocal Quality: Low vocal intensity;Breathy Volitional Cough: Weak Volitional Swallow: Able to elicit Pharyngeal Secretions: Standing secretions in (comment) (pharynx)     Reason for Referral Objectively evaluate swallowing function   Oral Phase Oral Preparation/Oral Phase Oral Phase: Impaired Oral - Nectar Oral - Nectar Teaspoon: Reduced posterior  propulsion;Lingual/palatal residue;Weak lingual manipulation Oral - Nectar Straw: Reduced posterior propulsion;Lingual/palatal  residue;Weak lingual manipulation Oral - Thin Oral - Thin Teaspoon: Reduced posterior  propulsion;Lingual/palatal residue;Weak lingual manipulation Oral - Thin Cup: Reduced posterior propulsion;Lingual/palatal  residue;Weak lingual manipulation Oral - Thin Straw: Reduced posterior propulsion;Lingual/palatal  residue;Weak lingual manipulation   Pharyngeal Phase Pharyngeal Phase Pharyngeal Phase: Impaired Pharyngeal - Nectar Pharyngeal - Nectar Teaspoon: Reduced epiglottic  inversion;Reduced pharyngeal peristalsis;Reduced anterior  laryngeal mobility;Reduced laryngeal elevation;Pharyngeal residue  - pyriform sinuses;Pharyngeal residue - valleculae;Trace  aspiration;Pharyngeal residue - cp segment Pharyngeal - Nectar Cup: Reduced epiglottic inversion;Reduced  pharyngeal peristalsis;Reduced anterior laryngeal  mobility;Reduced laryngeal elevation;Pharyngeal residue -  valleculae;Pharyngeal residue - pyriform sinuses;Reduced tongue  base retraction;Pharyngeal residue - cp  segment;Penetration/Aspiration after swallow Penetration/Aspiration details (nectar cup): Material enters  airway, remains ABOVE vocal cords and not ejected out Pharyngeal - Thin Pharyngeal - Thin Teaspoon: Reduced epiglottic inversion;Reduced  pharyngeal peristalsis;Reduced anterior laryngeal  mobility;Reduced laryngeal elevation;Pharyngeal residue -  valleculae;Pharyngeal  residue - pyriform sinuses;Reduced tongue  base retraction;Pharyngeal residue - cp segment Pharyngeal - Thin Cup: Reduced epiglottic inversion;Reduced  pharyngeal peristalsis;Reduced anterior laryngeal  mobility;Reduced laryngeal elevation;Pharyngeal residue -  valleculae;Pharyngeal residue - pyriform sinuses;Reduced tongue  base retraction;Pharyngeal residue - cp segment Pharyngeal - Thin Straw: Reduced epiglottic inversion;Reduced  pharyngeal peristalsis;Reduced anterior laryngeal  mobility;Reduced laryngeal elevation;Pharyngeal residue -  valleculae;Pharyngeal residue - pyriform sinuses;Reduced tongue  base retraction;Pharyngeal residue - cp segment Pharyngeal Phase - Comment Pharyngeal Comment: chin tuck with liquids decreases amount of  pharyngeal accumulation, cued dry swallows - pt able to cough and  "hock" up mild amount of stasis x1, does not sense pharygneal  stasis  Cervical  Esophageal Phase    GO    Cervical Esophageal Phase Cervical Esophageal Phase: Impaired Cervical Esophageal Phase - Nectar Nectar Teaspoon: Reduced cricopharyngeal relaxation Nectar Cup: Reduced cricopharyngeal relaxation Cervical Esophageal Phase - Thin Thin Teaspoon: Reduced cricopharyngeal relaxation Thin Cup: Reduced cricopharyngeal relaxation Thin Straw: Reduced cricopharyngeal relaxation         Luanna Salk, MS [facility] SLP [phone]     Micro Results      Recent Results (from the past 240 hour(s))  CULTURE, BAL-QUANTITATIVE     Status: None   Collection Time    06/25/13  4:52 PM      Result Value Ref Range Status   Specimen Description BRONCHIAL ALVEOLAR LAVAGE   Final   Special Requests NONE   Final   Gram Stain     Final   Value: ABUNDANT WBC PRESENT, PREDOMINANTLY PMN     RARE SQUAMOUS EPITHELIAL CELLS PRESENT     FEW GRAM POSITIVE COCCI IN PAIRS AND CHAINS     Performed at [facility]     Final   Value: 80,000 COLONIES/ML     Performed at [facility]   Culture     Final    Value: STAPHYLOCOCCUS AUREUS     Note: RIFAMPIN AND GENTAMICIN SHOULD NOT BE USED AS SINGLE DRUGS FOR TREATMENT OF STAPH INFECTIONS.     Performed at [facility]   Report Status 06/29/2013 FINAL   Final   Organism ID, Bacteria STAPHYLOCOCCUS AUREUS   Final  AFB CULTURE WITH SMEAR     Status: None   Collection Time    06/25/13  4:52 PM      Result Value Ref Range Status   Specimen Description BRONCHIAL ALVEOLAR LAVAGE   Final   Special Requests NONE   Final   ACID FAST SMEAR     Final   Value: NO ACID FAST BACILLI SEEN     Performed at [facility]   Culture     Final   Value: CULTURE WILL BE EXAMINED FOR 6 WEEKS BEFORE ISSUING A FINAL REPORT     Performed at [facility]   Report Status PENDING   Incomplete  FUNGUS CULTURE W SMEAR     Status: None   Collection Time    06/25/13  4:52 PM      Result Value Ref Range Status   Specimen Description BRONCHIAL ALVEOLAR LAVAGE   Final   Special Requests NONE   Final   Fungal Smear     Final   Value: NO YEAST OR FUNGAL ELEMENTS SEEN     Performed at [facility]   Culture     Final   Value: CANDIDA ALBICANS     Performed at [facility]   Report Status PENDING   Incomplete     History of present illness and  Hospital Course:     Kindly see H&P for history of present illness and admission details, please review complete Labs, Consult reports and Test reports for all details in brief JAYSE HODKINSON, is a 80 y.o. male, patient with history of  COPD, past history of smoking, CAD, atrial flutter, AAA, hypertension, dyslipidemia, hypertension, was admitted to the hospital after he presented with chief complaints of hemoptysis, initially he was being treated for pneumonia in the outpatient setting for ongoing cough. Workup in the ER was suggestive of left hilar mass, further workup which included bronchoscopy with biopsy showed evidence of  non-small cell lung cancer, also clinically compressing over  esophagus causing dysphagia and tumor eroding through left chest wall causing intermittent bleed.     1. Hemoptysis/Lung mass likely lung CA (+tobacco use); non-small cell lung cancer compressing esophagus, noting left-sided chest wall with close proximity to pulmonary artery - seen by oncology, pulmonary and radiation oncology, status post bronchoscopy on 06/25/2013, now a candidate for comfort care and palliative care. Palliative radiation treatments started on 07/02/2013, family and patient understood that these were only palliative in nature and patient is expected to pass away soon. He was also seen by palliative care team and multiple discussions were made by palliative care me and family members and patient, family has now decided on 07/05/2013 to withhold any future radiation treatments as they think it is making him quite weak which I agree with. They were offered inpatient hospice versus residential hospice which they have declined. They will now take patient home with home hospice they understand that he will pass away soon either with massive hemoptysis, external bleeding through the chest wall eroding mass or by gradual progression of his malignancy.    He needed cauterization of his left-sided chest wall bleed on 07/02/2013. I have told the patient and family that he can get catastrophic external or internal bleed which might be life ending.    MSSA/Candidia in Bronch washing recent H/O of Asp.PNA - replaced on oral doxycycline. Stop Diflucan.     2. Dysphagia due lung mass compressing over esophagus : failed swallow evaluation; at risk for aspiration, recent h/p aspiration PNA, esophagogram shows Near complete obstruction of the mid esophagus with irregular tapering of the esophagus atthis level concerning for esophageal mass/ neoplasm. Per GI hold EGD; ? Need stenting (if done will need tracheal stenting as well). Patient now wants soft foods for comfort feeds only which will be  continued with feeding assistance and aspiration precautions, family understands he is at high risk for aspiration.   PO Meds and diet as tolerated.    3. Atrial fibrillation: Tinea home dose beta blocker and the doxepin is able to swallow.    4. Hypertension: Acceptable continue with home dose beta blocker is able to swallow.    5. Hyperlipedemia: Statin discontinued as goal of care now comfort.     6. COPD; h/o tobacco use; no wheezing on exam; cont bronchodilators as needed, o2 will be ordered.    7. History of CVA holding Xarelto due to hemoptysis;     8. History of recent left hip repair per notes: Dr. Alvan Dame on 1/28-okay for 50% weightbearing on the left lower extremity. Patient to followup with Dr. Alvan Dame in in one month for repeat x-rays, if continues to do well with good healing, patient will likely be advanced to more weightbearing status .Xray: 1/28: Healing fracture involving the distal shaft of the left femur status post sideplate and screw device reduction. He remains severely deconditioned and requiring 2 person assistance getting out of the bed. Does not want to pursue ongoing physical therapy.     9.PICC line care per Hospice protocol, IV comfort meds if needed.      Today   Subjective:   Yahshua Thibault today has no headache,no chest abdominal pain,no new weakness tingling or numbness, wants to go home today. Feels weak all over.  Objective:   Blood pressure 132/90, pulse 100, temperature 97.8 F (36.6 C), temperature source Axillary, resp. rate 16, height 6\' 5"  (1.956 m), weight 111.3 kg (245 lb 6 oz),  SpO2 98.00%.   Intake/Output Summary (Last 24 hours) at 07/05/13 1122 Last data filed at 07/05/13 1100  Gross per 24 hour  Intake    385 ml  Output    625 ml  Net   -240 ml    Exam Awake but appears tired and fatigued,  No new F.N deficits, Normal affect [place].AT,PERRAL Supple Neck,No JVD, No cervical lymphadenopathy appriciated.  Symmetrical  Chest wall movement, Good air movement bilaterally, few crackles, left posterior chest wall mass under bandage no fresh bleeding RRR,No Gallops,Rubs or new Murmurs, No Parasternal Heave +ve B.Sounds, Abd Soft, Non tender, No organomegaly appriciated, No rebound -guarding or rigidity. No Cyanosis, Clubbing or edema, No new Rash or bruise  Data Review   CBC w Diff: Lab Results  Component Value Date   WBC 6.9 06/24/2013   HGB 11.1* 06/24/2013   HCT 32.9* 06/24/2013   PLT 181 06/24/2013   LYMPHOPCT 25 06/21/2013   MONOPCT 9 06/21/2013   EOSPCT 1 06/21/2013   BASOPCT 0 06/21/2013    CMP: Lab Results  Component Value Date   NA 138 06/22/2013   K 3.8 06/22/2013   CL 99 06/22/2013   CO2 27 06/22/2013   BUN 13 06/22/2013   CREATININE 0.67 06/22/2013   PROT 6.2 06/22/2013   ALBUMIN 2.6* 06/22/2013   BILITOT 0.7 06/22/2013   ALKPHOS 136* 06/22/2013   AST 16 06/22/2013   ALT 17 06/22/2013  .   Total Time in preparing paper work, data evaluation and todays exam - 35 minutes  Thurnell Lose M.D on 07/05/2013 at [place]  [phone]

## 2013-07-05 NOTE — Progress Notes (Signed)
10 Days Post-Op  Subjective: Tired,  Took dressing down and it started bleeding again.  Objective: Vital signs in last 24 hours: Temp:  [97.8 F (36.6 C)-98.3 F (36.8 C)] 97.8 F (36.6 C) (03/19 0545) Pulse Rate:  [78-100] 100 (03/19 0545) Resp:  [16] 16 (03/19 0545) BP: (122-132)/(69-90) 132/90 mmHg (03/19 0545) SpO2:  [85 %-99 %] 98 % (03/19 0545) Last BM Date: 07/04/13  Intake/Output from previous day: 03/18 0701 - 03/19 0700 In: 1195 [I.V.:645; IV Piggyback:550] Out: 525 [Urine:525] Intake/Output this shift:    General appearance: cooperative and very tired and weak. Skin: see below   After removing dressing she started bleeding from 3 o'clock and 11 o'clock positions.  Lab Results:  No results found for this basename: WBC, HGB, HCT, PLT,  in the last 72 hours  BMET No results found for this basename: NA, K, CL, CO2, GLUCOSE, BUN, CREATININE, CALCIUM,  in the last 72 hours PT/INR No results found for this basename: LABPROT, INR,  in the last 72 hours  No results found for this basename: AST, ALT, ALKPHOS, BILITOT, PROT, ALBUMIN,  in the last 168 hours   Lipase     Component Value Date/Time   LIPASE 12 05/03/2013 1319     Studies/Results: Dg Chest Port 1 View  07/04/2013   CLINICAL DATA:  Status post central line placement.  EXAM: PORTABLE CHEST - 1 VIEW  COMPARISON:  CT ANGIO CHEST W/CM &/OR WO/CM dated 06/21/2013; DG CHEST 1V PORT dated 05/12/2013  FINDINGS: Patient has undergone placement of a PICC line on the right. The tip of the catheter lies in the region of the proximal SVC.  The lungs are reasonably well inflated. The interstitial markings remain increased. The cardiopericardial silhouette is normal in size. The pulmonary vascularity is not engorged.  IMPRESSION: 1. There is no evidence of a postprocedure complication following placement of the PICC line on the right. 2. There remain increased interstitial densities within both lungs which may reflect fibrosis.    Electronically Signed   By: David  Martinique   On: 07/04/2013 10:13    Medications: . doxycycline (VIBRAMYCIN) IV  100 mg Intravenous Q12H  . fluconazole (DIFLUCAN) IV  100 mg Intravenous Q24H  . levothyroxine  37.5 mcg Intravenous Daily    Assessment/Plan . Bleeding mass left posterior back. This looks like metastatic tumor to me. It had a venous plexus in the mid portion and this is what was bleeding. He has at least one other superficial mass on his back between his shoulder blades that is about 4 cm in diameter, it looks like a lipoma, but is more fixed and solid.  2. NSCLC/100 pound weight loss over the last year  3. COPD/40 year history of tobacco use  4. CAD/Aflutter/Mild MR, TR, and AV stenosis  5. S/p AAA repair 05/2010  6. Hypertension  7. Hyperlipidemia  8. Hypothyroid  9. Fall with fx left hip 01/2013; marked debility since that time  10. Hx of Polio   Plan:  I used silver nitrate again and stopped bleeding.  Then applied surgicel, 4 x 4's, and Tegaderm dressing.  LOS: 14 days    Austin [name] 07/05/2013

## 2013-07-05 NOTE — Progress Notes (Signed)
Report given to daughters Juliann Pulse and Clarene Critchley) via telephone...called hospice to make aware that pt was transporting home today.  Pt transported via [facility] home..paperwork sent.

## 2013-07-06 ENCOUNTER — Encounter: Payer: Medicare Other | Admitting: Radiation Oncology

## 2013-07-06 ENCOUNTER — Ambulatory Visit: Payer: Medicare Other

## 2013-07-06 ENCOUNTER — Encounter: Payer: Self-pay | Admitting: Radiation Oncology

## 2013-07-09 ENCOUNTER — Ambulatory Visit: Payer: Medicare Other

## 2013-07-10 ENCOUNTER — Ambulatory Visit: Payer: Medicare Other

## 2013-07-10 NOTE — Progress Notes (Signed)
  Radiation Oncology         [phone] ________________________________  Name: Austin [name] MRN: [id number]  Date: 07/02/2013  DOB: 1927/01/28  Simulation Verification Note   NARRATIVE: The patient was brought to the treatment unit and placed in the planned treatment position. The clinical setup was verified. Then port films were obtained and uploaded to the radiation oncology medical record software.  The treatment beams were carefully compared against the planned radiation fields. The position, location, and shape of the radiation fields was reviewed. The targeted volume of tissue appears to be appropriately covered by the radiation beams. Based on my personal review, I approved the simulation verification. The patient's treatment will proceed as planned.  ________________________________   Jodelle Gross, MD, PhD

## 2013-07-10 NOTE — Progress Notes (Signed)
   Department of Radiation Oncology  Phone:  [phone] Fax:        [phone]  Weekly Treatment Note    Name: Austin [name] Date: 07/10/2013 MRN: [id number] DOB: 01/24/1927   Current dose: 9 Gy  Current fraction: 3   MEDICATIONS: Current Outpatient Prescriptions  Medication Sig Dispense Refill  . albuterol (PROVENTIL) (2.5 MG/3ML) 0.083% nebulizer solution Take 3 mLs (2.5 mg total) by nebulization every 6 (six) hours as needed for wheezing or shortness of breath.  75 mL  12  . digoxin (LANOXIN) 0.125 MG tablet Take 1 tablet (0.125 mg total) by mouth daily.      [name] doxycycline (VIBRAMYCIN) 50 MG capsule Take 2 capsules (100 mg total) by mouth 2 (two) times daily.  12 capsule  0  . fluconazole (DIFLUCAN) 100 MG tablet Take 1 tablet (100 mg total) by mouth daily.  10 tablet  0  . LORazepam (ATIVAN) 1 MG tablet Take 1 tablet (1 mg total) by mouth every 6 (six) hours as needed for anxiety.  30 tablet  0  . LORazepam (ATIVAN) 2 MG/ML concentrated solution Take 1 mL (2 mg total) by mouth every 6 (six) hours as needed for anxiety.  30 mL  0  . metoprolol tartrate (LOPRESSOR) 25 MG tablet Take 1 tablet (25 mg total) by mouth 2 (two) times daily.  60 tablet  0  . Morphine Sulfate (MORPHINE CONCENTRATE) 10 mg / 0.5 ml concentrated solution Take 0.5 mLs (10 mg total) by mouth every hour as needed for moderate pain or shortness of breath.  30 mL  0  . Morphine Sulfate (MORPHINE CONCENTRATE) 10 mg / 0.5 ml concentrated solution Take 0.5 mLs (10 mg total) by mouth every 6 (six) hours.  30 mL  0   No current facility-administered medications for this encounter.     ALLERGIES: Review of patient's allergies indicates no known allergies.   LABORATORY DATA:  Lab Results  Component Value Date   WBC 6.9 06/24/2013   HGB 11.1* 06/24/2013   HCT 32.9* 06/24/2013   MCV 89.4 06/24/2013   PLT 181 06/24/2013   Lab Results  Component Value Date   NA 138 06/22/2013   K 3.8 06/22/2013   CL 99 06/22/2013     CO2 27 06/22/2013   Lab Results  Component Value Date   ALT 17 06/22/2013   AST 16 06/22/2013   ALKPHOS 136* 06/22/2013   BILITOT 0.7 06/22/2013     NARRATIVE: Sherryll Burger [place] was seen today for weekly treatment management. The chart was checked and the patient's films were reviewed. The patient has not voice any specific complaints regarding his course of radiation treatment.  PHYSICAL EXAMINATION: The patient remains fatigued/ill appearing     ASSESSMENT: The patient is doing satisfactorily with treatment.  PLAN: We will continue with the patient's radiation treatment as planned.

## 2013-07-10 NOTE — Progress Notes (Signed)
  Radiation Oncology         [phone] ________________________________  Name: Austin [name] MRN: [id number]  Date: 07/06/2013  DOB: 1927/03/10  End of Treatment Note  Diagnosis:   Lung cancer     Indication for treatment:  Palliative       Radiation treatment dates:   07/02/2013 through 07/04/2013  Site/dose:   The patient was planned to receive a dose of 30 gray in 10 fractions using a three-field 3-D conformal technique. This targeted the left lung tumor extending to the mediastinum/esophageal region.  Narrative: The patient tolerated radiation treatment but his overall status declined. He decided to forego additional treatment after 3 fractions. He therefore received a total of 9 gray before discontinuing treatment in proceeding with hospice.  Plan: The patient has completed radiation treatment. The patient will return to radiation oncology clinic for routine followup in one month. I advised the patient to call or return sooner if they have any questions or concerns related to their recovery or treatment. ________________________________  Jodelle Gross, M.D., Ph.D.

## 2013-07-11 ENCOUNTER — Ambulatory Visit: Payer: Medicare Other

## 2013-07-12 ENCOUNTER — Ambulatory Visit: Payer: Medicare Other

## 2013-07-13 ENCOUNTER — Ambulatory Visit: Payer: Medicare Other

## 2013-07-16 ENCOUNTER — Ambulatory Visit: Payer: Medicare Other

## 2013-07-18 DEATH — deceased

## 2013-07-25 LAB — FUNGUS CULTURE W SMEAR: Fungal Smear: NONE SEEN

## 2013-08-07 LAB — AFB CULTURE WITH SMEAR (NOT AT ARMC): ACID FAST SMEAR: NONE SEEN

## 2014-10-25 IMAGING — CR DG HIP (WITH OR WITHOUT PELVIS) 2-3V*L*
4 series · 4 of 4 positions shown · non-contrast
Comparison: None available at time of study interpretation.

CLINICAL DATA: Left hip pain, fall.

LEFT HIP - COMPLETE 2+ VIEW

[t hip frog leg left (1 of 2)]
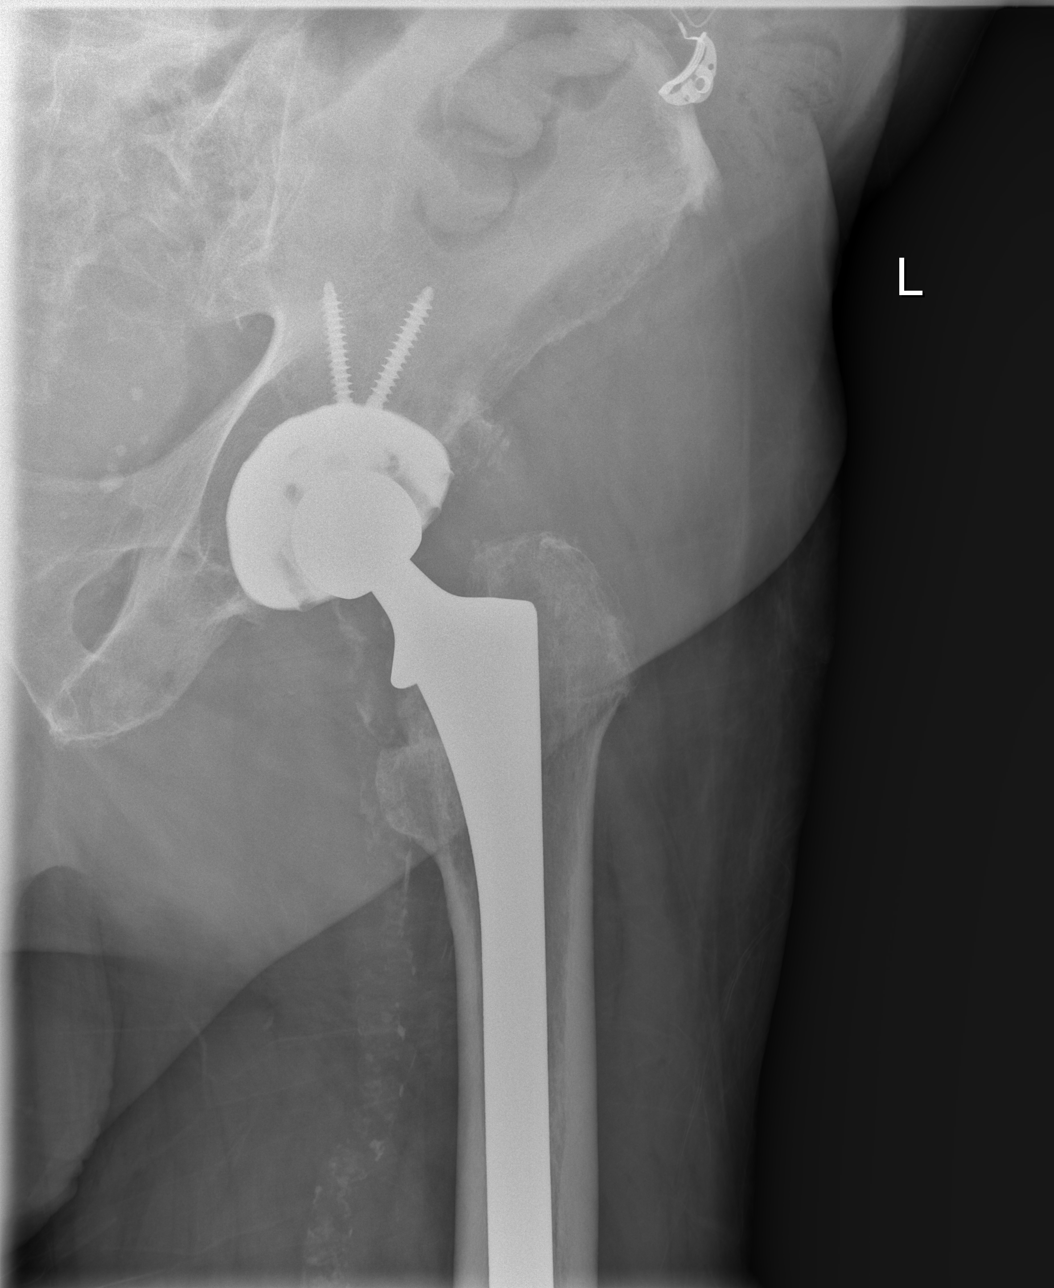

[t hip frog leg left (2 of 2)]
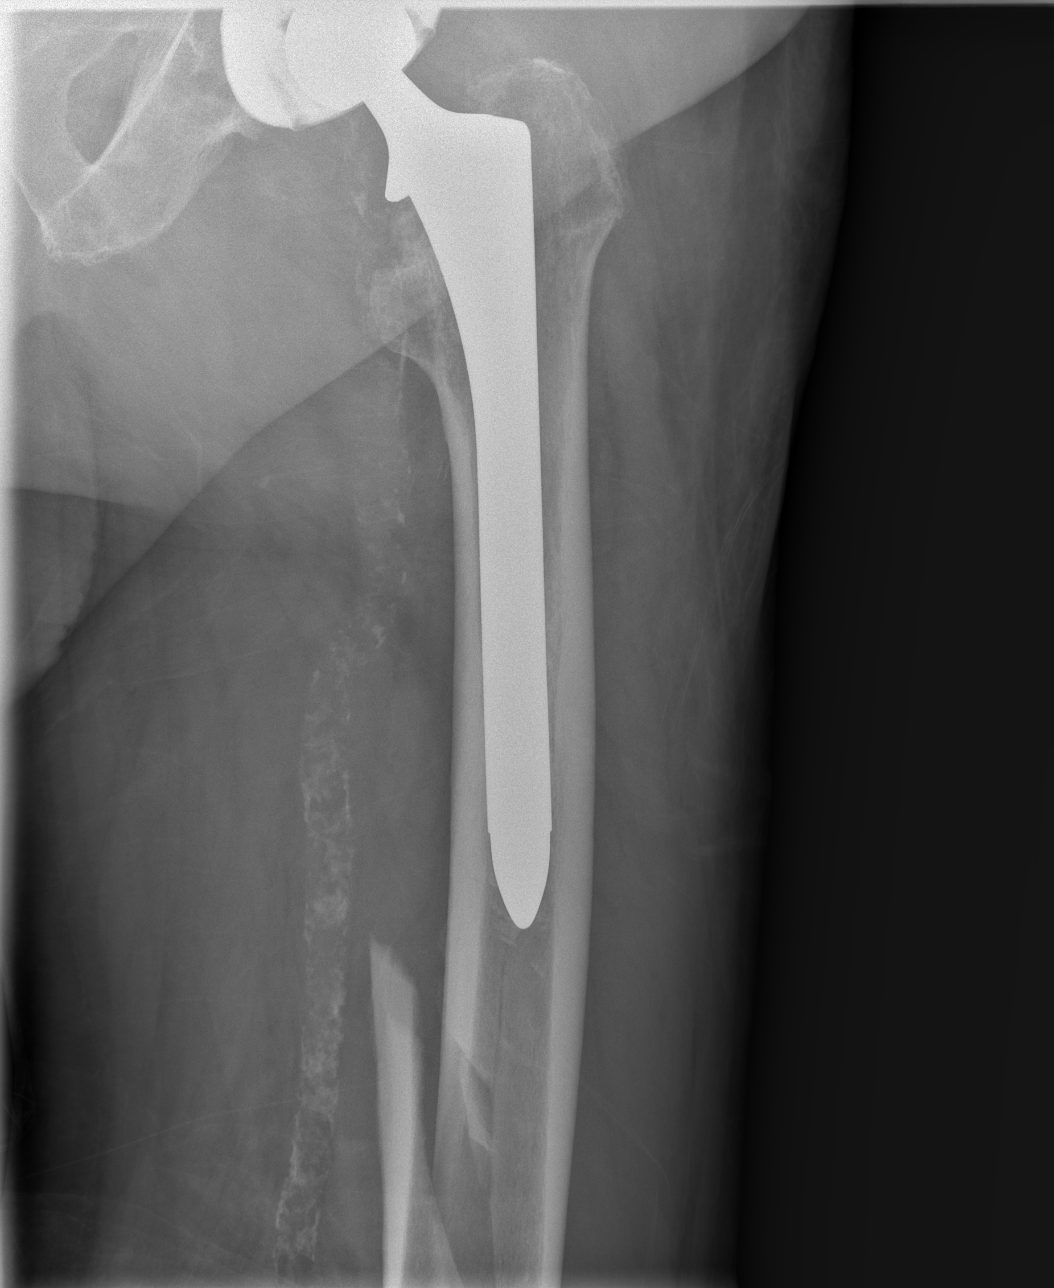

[t pelvis ap (1 of 2)]
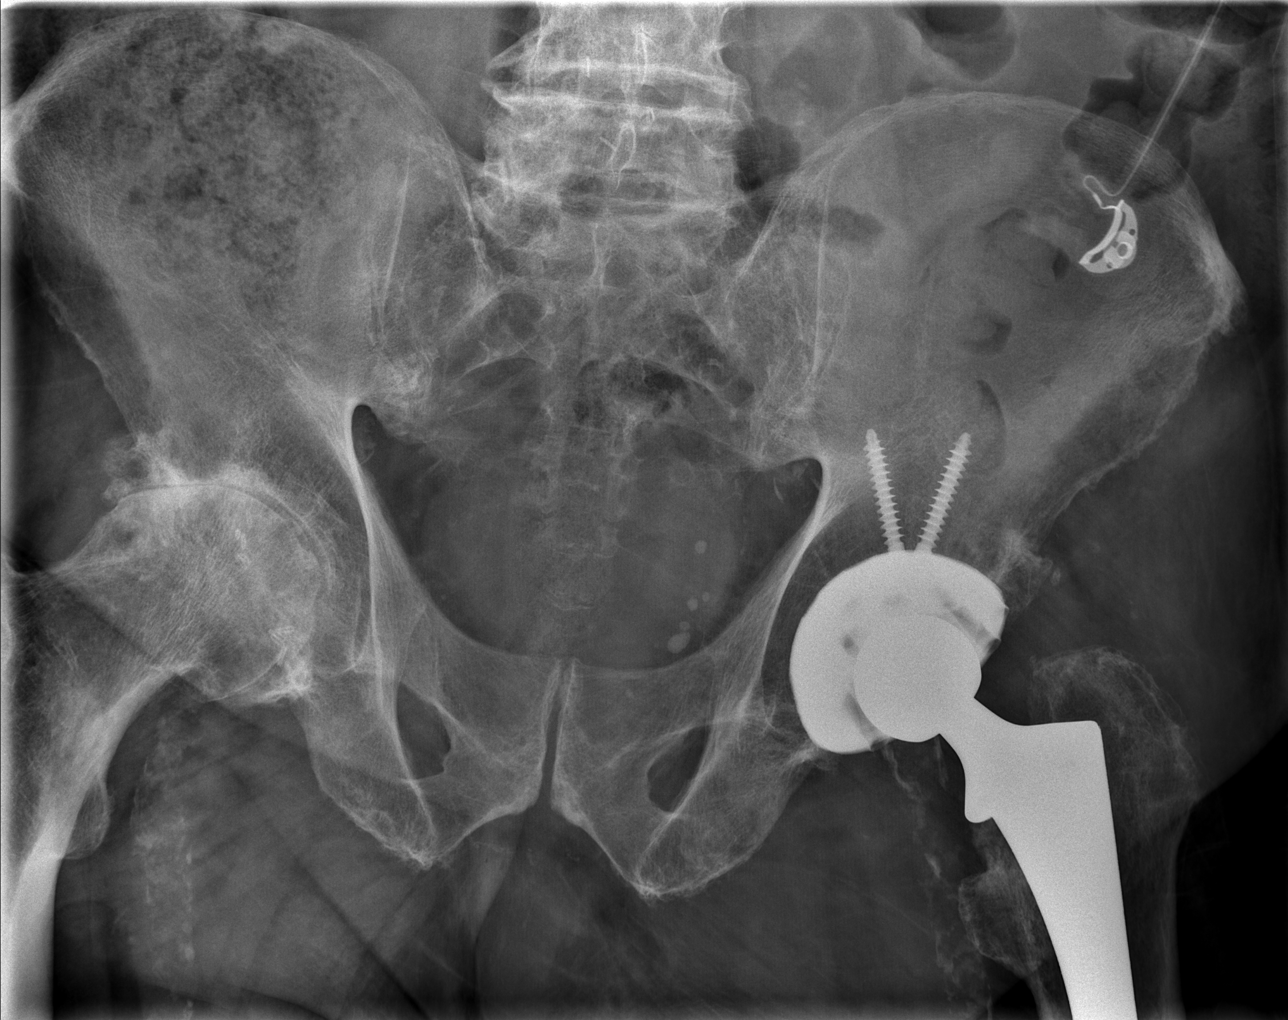

[t pelvis ap (2 of 2)]
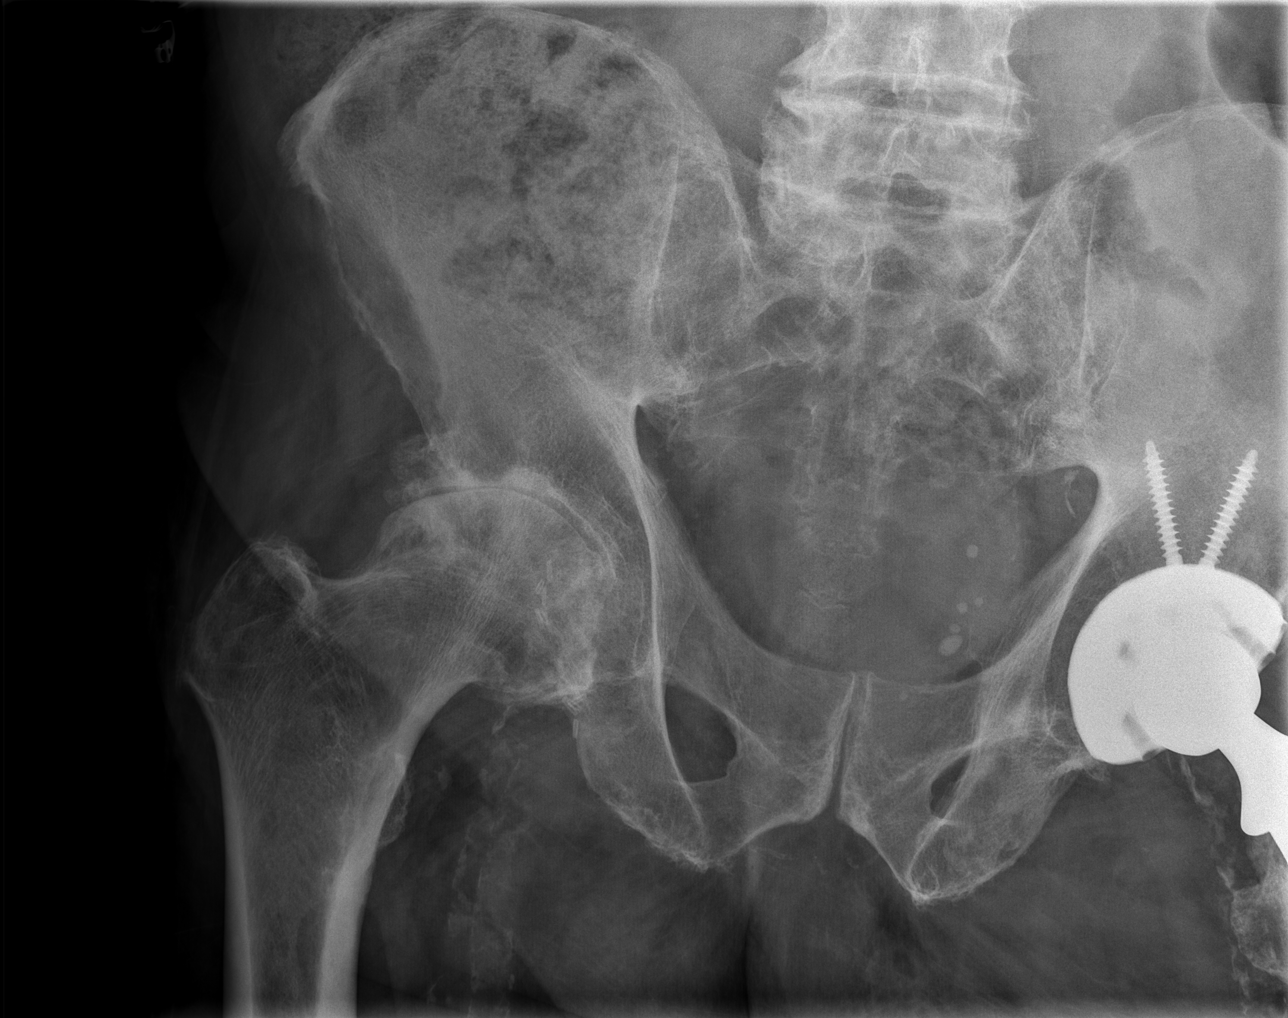

[4 of 4 positions shown; findings below may reference images not displayed]

FINDINGS: Frontal view of the pelvis and three images of the left
hip. Status post left hip total arthroplasty, with intact well
seated components. Included view of the distal femur, distal to the
femoral stem demonstrates a partially imaged comminuted fracture.

Severe right hip osteoarthrosis.  Phleboliths in the pelvis.
Severe vascular calcifications.  Surgical clips in the right
inguinal region.  Moderate degenerative change of the lumbar spine.
IMPRESSION: Partially imaged distal left femur fracture, consider dedicated
femur radiograph if clinically indicated.

Status post left hip total arthroplasty without radiographic
findings of hardware failure.

Severe degenerative change of the right hip.

## 2014-10-25 IMAGING — CR DG KNEE 1-2V*L*
1 series · 1 of 1 positions shown · non-contrast
Comparison: None available at time of study interpretation.

***ADDENDUM*** CREATED: 01/20/2013 [DATE]
CLINICAL DATA: Pain, fall

LEFT KNEE - 1-2 VIEW

[t hip frog leg left]
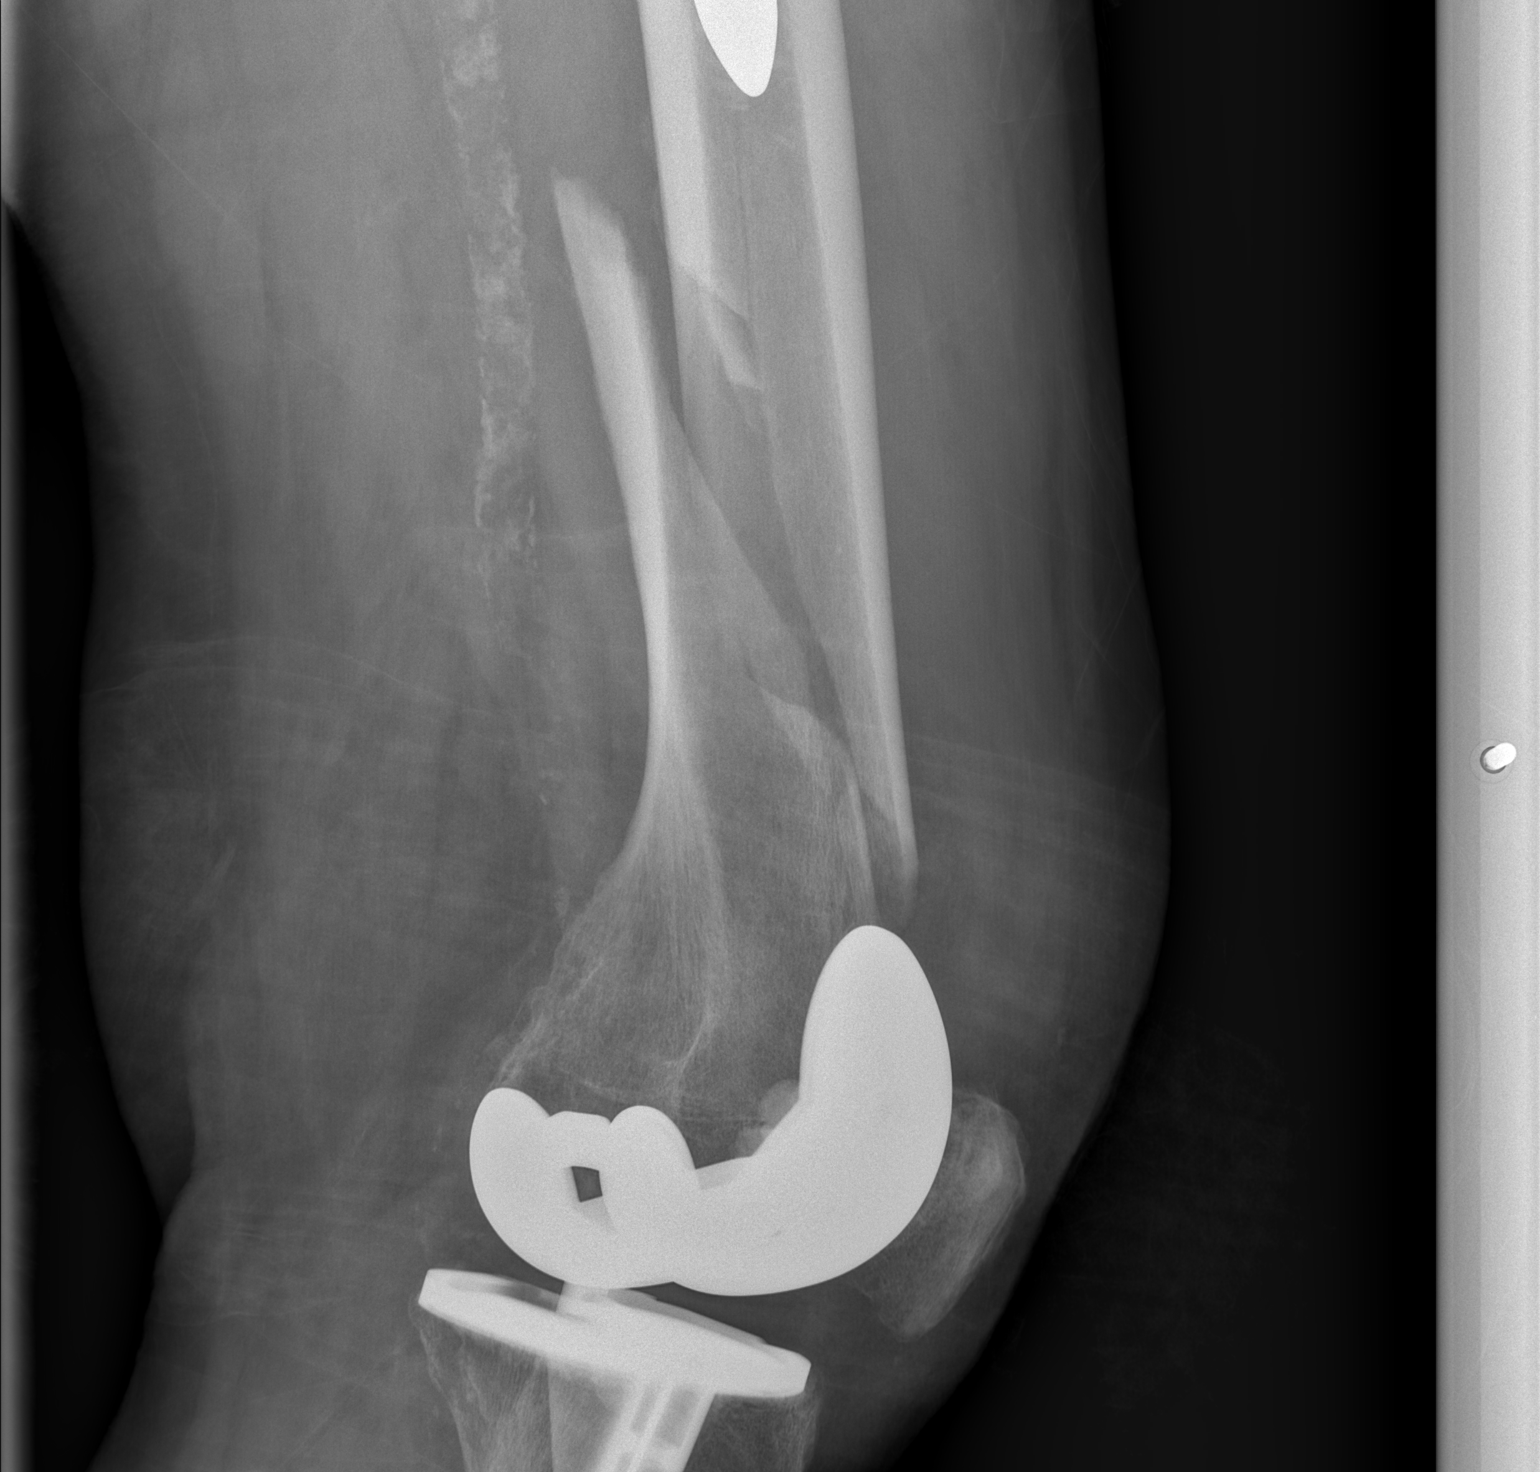

[1 of 1 positions shown; findings below may reference images not displayed]

FINDINGS: The frontal radiograph of the knee is obliqued due to
patient condition, in frog-leg position.

***END ADDENDUM*** SIGNED BY: Danyell Estep
FINDINGS: Frontal radiograph.

Comminuted mildly impacted distal femur fracture, immediately
proximal to the femoral component of patient's total knee
arthroplasty.  No periprosthetic lucency.  Fracture fragments are
in gross alignment.  No dislocation.

No destructive bony lesions.  Moderate vascular calcifications.
Small suprapatellar joint effusion.
IMPRESSION: Comminuted, grossly aligned distal femur fracture extending to the
femoral component of patient's total knee arthroplasty.  No
dislocation.

## 2014-10-28 ENCOUNTER — Ambulatory Visit: Payer: Medicare Other | Admitting: Surgery

## 2014-10-28 ENCOUNTER — Encounter (HOSPITAL_COMMUNITY): Payer: Medicare Other

## 2021-10-21 ENCOUNTER — Other Ambulatory Visit (HOSPITAL_COMMUNITY): Payer: Self-pay
